# Patient Record
Sex: Female | Born: 1935 | Race: Black or African American | Hispanic: No
Health system: Southern US, Community
[De-identification: ages and names within clinical notes are randomized; demographics above are authoritative.]

## PROBLEM LIST (undated history)

## (undated) DIAGNOSIS — M4802 Spinal stenosis, cervical region: Secondary | ICD-10-CM

## (undated) DIAGNOSIS — I5022 Chronic systolic (congestive) heart failure: Secondary | ICD-10-CM

## (undated) DIAGNOSIS — F0390 Unspecified dementia without behavioral disturbance: Secondary | ICD-10-CM

## (undated) DIAGNOSIS — I340 Nonrheumatic mitral (valve) insufficiency: Secondary | ICD-10-CM

## (undated) DIAGNOSIS — Z950 Presence of cardiac pacemaker: Secondary | ICD-10-CM

## (undated) DIAGNOSIS — G3189 Other specified degenerative diseases of nervous system: Secondary | ICD-10-CM

## (undated) DIAGNOSIS — E119 Type 2 diabetes mellitus without complications: Secondary | ICD-10-CM

## (undated) DIAGNOSIS — M1990 Unspecified osteoarthritis, unspecified site: Secondary | ICD-10-CM

## (undated) DIAGNOSIS — N118 Other chronic tubulo-interstitial nephritis: Secondary | ICD-10-CM

## (undated) DIAGNOSIS — I110 Hypertensive heart disease with heart failure: Secondary | ICD-10-CM

## (undated) DIAGNOSIS — I739 Peripheral vascular disease, unspecified: Secondary | ICD-10-CM

## (undated) DIAGNOSIS — Z85038 Personal history of other malignant neoplasm of large intestine: Secondary | ICD-10-CM

## (undated) DIAGNOSIS — G9529 Other cord compression: Secondary | ICD-10-CM

## (undated) DIAGNOSIS — I480 Paroxysmal atrial fibrillation: Secondary | ICD-10-CM

## (undated) DIAGNOSIS — R740 Nonspecific elevation of levels of transaminase and lactic acid dehydrogenase [LDH]: Secondary | ICD-10-CM

## (undated) DIAGNOSIS — E785 Hyperlipidemia, unspecified: Secondary | ICD-10-CM

## (undated) DIAGNOSIS — I2510 Atherosclerotic heart disease of native coronary artery without angina pectoris: Secondary | ICD-10-CM

## (undated) DIAGNOSIS — I2721 Secondary pulmonary arterial hypertension: Secondary | ICD-10-CM

## (undated) DIAGNOSIS — F329 Major depressive disorder, single episode, unspecified: Secondary | ICD-10-CM

## (undated) DIAGNOSIS — M109 Gout, unspecified: Secondary | ICD-10-CM

## (undated) DIAGNOSIS — E538 Deficiency of other specified B group vitamins: Secondary | ICD-10-CM

## (undated) DIAGNOSIS — C801 Malignant (primary) neoplasm, unspecified: Secondary | ICD-10-CM

## (undated) DIAGNOSIS — Z8673 Personal history of transient ischemic attack (TIA), and cerebral infarction without residual deficits: Secondary | ICD-10-CM

## (undated) DIAGNOSIS — R918 Other nonspecific abnormal finding of lung field: Secondary | ICD-10-CM

## (undated) DIAGNOSIS — N189 Chronic kidney disease, unspecified: Secondary | ICD-10-CM

## (undated) DIAGNOSIS — R7401 Elevation of levels of liver transaminase levels: Secondary | ICD-10-CM

## (undated) DIAGNOSIS — Z794 Long term (current) use of insulin: Secondary | ICD-10-CM

## (undated) DIAGNOSIS — I071 Rheumatic tricuspid insufficiency: Secondary | ICD-10-CM

## (undated) DIAGNOSIS — F32A Depression, unspecified: Secondary | ICD-10-CM

## (undated) DIAGNOSIS — J189 Pneumonia, unspecified organism: Secondary | ICD-10-CM

## (undated) HISTORY — DX: Atherosclerotic heart disease of native coronary artery without angina pectoris: I25.10

## (undated) HISTORY — DX: Nonrheumatic mitral (valve) insufficiency: I34.0

## (undated) HISTORY — DX: Rheumatic tricuspid insufficiency: I07.1

## (undated) HISTORY — PX: BREAST LUMPECTOMY: SHX2

## (undated) HISTORY — DX: Pneumonia, unspecified organism: J18.9

## (undated) HISTORY — DX: Other cord compression: G95.29

## (undated) HISTORY — DX: Malignant (primary) neoplasm, unspecified: C80.1

## (undated) HISTORY — DX: Other specified degenerative diseases of nervous system: G31.89

## (undated) HISTORY — PX: CATARACT EXTRACTION: SUR2

## (undated) HISTORY — DX: Personal history of other malignant neoplasm of large intestine: Z85.038

## (undated) HISTORY — PX: LUMBAR SPINE SURGERY: SHX701

## (undated) HISTORY — DX: Secondary pulmonary arterial hypertension: I27.21

## (undated) HISTORY — DX: Gout, unspecified: M10.9

## (undated) HISTORY — DX: Chronic kidney disease, unspecified: N18.9

## (undated) HISTORY — DX: Hyperlipidemia, unspecified: E78.5

## (undated) HISTORY — DX: Spinal stenosis, cervical region: M48.02

## (undated) HISTORY — DX: Unspecified dementia without behavioral disturbance: F03.90

## (undated) HISTORY — DX: Peripheral vascular disease, unspecified: I73.9

## (undated) HISTORY — DX: Deficiency of other specified B group vitamins: E53.8

## (undated) HISTORY — DX: Type 2 diabetes mellitus without complications: E11.9

## (undated) HISTORY — DX: Presence of cardiac pacemaker: Z95.0

## (undated) HISTORY — DX: Personal history of transient ischemic attack (TIA), and cerebral infarction without residual deficits: Z86.73

## (undated) HISTORY — PX: OTHER SURGICAL HISTORY: SHX169

## (undated) HISTORY — DX: Nonspecific elevation of levels of transaminase and lactic acid dehydrogenase (ldh): R74.0

## (undated) HISTORY — DX: Other nonspecific abnormal finding of lung field: R91.8

## (undated) HISTORY — DX: Unspecified osteoarthritis, unspecified site: M19.90

## (undated) HISTORY — DX: Other chronic tubulo-interstitial nephritis: N11.8

## (undated) HISTORY — PX: EYE SURGERY: SHX253

## (undated) HISTORY — DX: Chronic systolic (congestive) heart failure: I50.22

## (undated) HISTORY — DX: Elevation of levels of liver transaminase levels: R74.01

## (undated) HISTORY — DX: Hypertensive heart disease with heart failure: I11.0

## (undated) HISTORY — PX: CARDIAC PACEMAKER PLACEMENT: SHX583

## (undated) HISTORY — DX: Long term (current) use of insulin: Z79.4

---

## 2015-02-19 ENCOUNTER — Other Ambulatory Visit (INDEPENDENT_AMBULATORY_CARE_PROVIDER_SITE_OTHER): Payer: Medicare Other

## 2015-02-19 ENCOUNTER — Ambulatory Visit (INDEPENDENT_AMBULATORY_CARE_PROVIDER_SITE_OTHER): Payer: Medicare Other | Admitting: Internal Medicine

## 2015-02-19 ENCOUNTER — Encounter: Payer: Self-pay | Admitting: Internal Medicine

## 2015-02-19 VITALS — BP 174/80 | HR 60 | Temp 98.3°F | Resp 16 | Ht <= 58 in | Wt 136.0 lb

## 2015-02-19 DIAGNOSIS — Z794 Long term (current) use of insulin: Secondary | ICD-10-CM

## 2015-02-19 DIAGNOSIS — R29898 Other symptoms and signs involving the musculoskeletal system: Secondary | ICD-10-CM

## 2015-02-19 DIAGNOSIS — R413 Other amnesia: Secondary | ICD-10-CM

## 2015-02-19 DIAGNOSIS — M109 Gout, unspecified: Secondary | ICD-10-CM

## 2015-02-19 DIAGNOSIS — N200 Calculus of kidney: Secondary | ICD-10-CM

## 2015-02-19 DIAGNOSIS — E1122 Type 2 diabetes mellitus with diabetic chronic kidney disease: Secondary | ICD-10-CM

## 2015-02-19 DIAGNOSIS — I10 Essential (primary) hypertension: Secondary | ICD-10-CM

## 2015-02-19 DIAGNOSIS — I739 Peripheral vascular disease, unspecified: Secondary | ICD-10-CM

## 2015-02-19 DIAGNOSIS — E785 Hyperlipidemia, unspecified: Secondary | ICD-10-CM | POA: Diagnosis not present

## 2015-02-19 DIAGNOSIS — N189 Chronic kidney disease, unspecified: Secondary | ICD-10-CM | POA: Diagnosis not present

## 2015-02-19 DIAGNOSIS — E118 Type 2 diabetes mellitus with unspecified complications: Secondary | ICD-10-CM

## 2015-02-19 DIAGNOSIS — Z950 Presence of cardiac pacemaker: Secondary | ICD-10-CM

## 2015-02-19 DIAGNOSIS — R011 Cardiac murmur, unspecified: Secondary | ICD-10-CM

## 2015-02-19 HISTORY — DX: Gout, unspecified: M10.9

## 2015-02-19 HISTORY — DX: Chronic kidney disease, unspecified: N18.9

## 2015-02-19 HISTORY — DX: Presence of cardiac pacemaker: Z95.0

## 2015-02-19 HISTORY — DX: Peripheral vascular disease, unspecified: I73.9

## 2015-02-19 LAB — CBC WITH DIFFERENTIAL/PLATELET
BASOS ABS: 0.1 10*3/uL (ref 0.0–0.1)
Basophils Relative: 0.7 % (ref 0.0–3.0)
EOS ABS: 0.3 10*3/uL (ref 0.0–0.7)
Eosinophils Relative: 3.2 % (ref 0.0–5.0)
HEMATOCRIT: 43.1 % (ref 36.0–46.0)
Hemoglobin: 14.2 g/dL (ref 12.0–15.0)
LYMPHS PCT: 30.7 % (ref 12.0–46.0)
Lymphs Abs: 2.4 10*3/uL (ref 0.7–4.0)
MCHC: 32.9 g/dL (ref 30.0–36.0)
MCV: 91.9 fl (ref 78.0–100.0)
Monocytes Absolute: 0.6 10*3/uL (ref 0.1–1.0)
Monocytes Relative: 8.2 % (ref 3.0–12.0)
NEUTROS ABS: 4.5 10*3/uL (ref 1.4–7.7)
NEUTROS PCT: 57.2 % (ref 43.0–77.0)
PLATELETS: 203 10*3/uL (ref 150.0–400.0)
RBC: 4.69 Mil/uL (ref 3.87–5.11)
RDW: 16.4 % — ABNORMAL HIGH (ref 11.5–15.5)
WBC: 7.9 10*3/uL (ref 4.0–10.5)

## 2015-02-19 LAB — COMPREHENSIVE METABOLIC PANEL
ALT: 22 U/L (ref 0–35)
AST: 26 U/L (ref 0–37)
Albumin: 3.6 g/dL (ref 3.5–5.2)
Alkaline Phosphatase: 111 U/L (ref 39–117)
BILIRUBIN TOTAL: 0.5 mg/dL (ref 0.2–1.2)
BUN: 19 mg/dL (ref 6–23)
CALCIUM: 9.7 mg/dL (ref 8.4–10.5)
CO2: 29 meq/L (ref 19–32)
CREATININE: 0.97 mg/dL (ref 0.40–1.20)
Chloride: 102 mEq/L (ref 96–112)
GFR: 70.76 mL/min (ref >60.00)
GLUCOSE: 200 mg/dL — AB (ref 70–99)
Potassium: 3.7 mEq/L (ref 3.5–5.1)
Sodium: 141 mEq/L (ref 135–145)
TOTAL PROTEIN: 7.9 g/dL (ref 6.0–8.3)

## 2015-02-19 LAB — PHOSPHORUS: Phosphorus: 2.7 mg/dL (ref 2.3–4.6)

## 2015-02-19 LAB — HEMOGLOBIN A1C: Hgb A1c MFr Bld: 7.9 % — ABNORMAL HIGH (ref 4.6–6.5)

## 2015-02-19 LAB — VITAMIN B12: Vitamin B-12: >1500 pg/mL — ABNORMAL HIGH (ref 211–911)

## 2015-02-19 LAB — TSH: TSH: 1.51 u[IU]/mL (ref 0.35–4.50)

## 2015-02-19 NOTE — Progress Notes (Signed)
Pre visit review using our clinic review tool, if applicable. No additional management support is needed unless otherwise documented below in the visit note. 

## 2015-02-19 NOTE — Patient Instructions (Signed)
  Test(s) ordered today. Your results will be released to Arthur (or called to you) after review, usually within 72hours after test completion. If any changes need to be made, you will be notified at that same time.  Monitor your blood pressure at home daily, goal is less than or around 130/80.  Medications reviewed and updated.  No changes recommended at this time.  A referral was ordered for urology and neurology.  Please schedule followup in 4 weeks, sooner if needed.

## 2015-02-19 NOTE — Progress Notes (Signed)
Subjective:    Patient ID: Allison Dunn, female    DOB: 03/11/1931, 81 y.o.   MRN: [id number]  HPI She is here to establish with a new pcp. She just moved down here to live with her sister - her sister brought her down here because of significant memory problems.   Memory loss: She has memory loss and does not remember most of November.  Her sister states she did have some slight memory difficulties at the end of summer when she saw her last-she will just repeat questions. She was confused one day in November and got lost driving someplace she knew how to get to.  This is when her family realized resisting an problem. She has not paid any of her bills since the beginning of October. She does not remember any of November and is unsure about parts of October.  We do not have any other medical history available except for her medications. She is a poor historian regarding current medical problems, surgeries, etc.  Diabetes: She is unsure how well her diabetes has been controlled in the past. Her sister is currently checking her sugar regularly and giving her insulin. Her sugars are in the low side she is not getting insulin. She is eating a healthy diet currently because her sister is preparing her food.  She is not currently exercising regularly. She is unsure when her last eye exam was.  Chronic kidney disease: She has chronic kidney disease, but is unsure how bad it is. She thinks she was following with a kidney specialist.  Pacemaker: She does have a pacemaker, but is unsure why. She is also taking amiodarone. She cannot tell me anything further regarding her history.  Peripheral vascular disease: Her sister knows that she did have a stent or surgery on her left leg arterial blockage. She is taking Plavix daily. Her left leg has been larger in her right leg.  Right arm weakness: She has a history of weakness in her right arm. She and her sister are not able to tolerate why she has the  weakness. She is unsure if she has had a prior stroke.  She has a kidney stone and just before she was moved down here she was supposed to have the stone removed by a urologist. She was advised that she needs to follow-up with the urologist to have this removed-they have a doctor and they were advised to follow-up with.  Medications and allergies reviewed with patient and updated.  Patient Active Problem List   Diagnosis Date Noted  . Hyperlipidemia 02/19/2015  . Gout 02/19/2015  . Kidney stone 02/19/2015  . PAD (peripheral artery disease) ([place]) 02/19/2015  . Right arm weakness 02/19/2015  . Cardiac pacemaker in situ 02/19/2015  . CKD (chronic kidney disease) 02/19/2015     Medication List         allopurinol 300 MG tablet  Commonly known as:  ZYLOPRIM  Take 300 mg by mouth daily.     amiodarone 200 MG tablet  Commonly known as:  PACERONE  Take 200 mg by mouth daily.     amLODipine-benazepril 5-10 MG capsule  Commonly known as:  LOTREL  Take 1 capsule by mouth daily.     aspirin 81 MG tablet  Take 81 mg by mouth daily.     atorvastatin 80 MG tablet  Commonly known as:  LIPITOR  Take 80 mg by mouth daily.     B-12 PO  Take 1,500 mcg by mouth  daily.     clopidogrel 75 MG tablet  Commonly known as:  PLAVIX  Take 75 mg by mouth daily.     HUMALOG 100 UNIT/ML injection  Generic drug:  insulin lispro  Inject into the skin 3 (three) times daily before meals. 6 units in the AM, 10 units before lunch, 10 units before bed     LEVEMIR 100 UNIT/ML injection  Generic drug:  insulin detemir  Inject 6 Units into the skin at bedtime.     phosphorus 155-852-130 MG tablet  Commonly known as:  K PHOS NEUTRAL  Take 250 mg by mouth 2 (two) times daily.     Vitamin D-3 1000 units Caps  Take 1,000 Units by mouth daily.     WOMENS MULTI VITAMIN & MINERAL PO  Take by mouth daily.         Past Medical History  Diagnosis Date  . Hypertension   . Hyperlipidemia   .  Diabetes mellitus without complication ([place])   . Cancer [facility])     breast    Past Surgical History  Procedure Laterality Date  . Kidney stone removal      Social History   Social History  . Marital Status: Widowed    Spouse Name: N/A  . Number of Children: N/A  . Years of Education: N/A   Social History Main Topics  . Smoking status: Never Smoker   . Smokeless tobacco: Never Used  . Alcohol Use: No  . Drug Use: No  . Sexual Activity: Not Asked   Other Topics Concern  . None   Social History Narrative  . None    Review of Systems  Constitutional: Negative for fever, chills and appetite change.  Respiratory: Positive for shortness of breath (with exertion). Negative for cough and wheezing.   Cardiovascular: Positive for palpitations (sometimes) and leg swelling (left leg swelling - stent in leg). Negative for chest pain.  Gastrointestinal: Negative for nausea and abdominal pain.  Neurological: Positive for weakness. Negative for dizziness, light-headedness, numbness and headaches.       Objective:   Filed Vitals:   02/19/15 1310  BP: 174/80  Pulse: 60  Temp: 98.3 F (36.8 C)  Resp: 16   Filed Weights   02/19/15 1310  Weight: 136 lb (61.689 kg)   Body mass index is 28.43 kg/(m^2).   Physical Exam  Constitutional: She is oriented to person, place, and time. She appears well-developed and well-nourished. No distress.  HENT:  Head: Normocephalic and atraumatic.  Right Ear: External ear normal.  Left Ear: External ear normal.  Mouth/Throat: Oropharynx is clear and moist.  Normal ear canals and TM  Eyes: Conjunctivae are normal.  Neck: Neck supple. No JVD present. No tracheal deviation present. No thyromegaly present.  Cardiovascular: Normal rate and regular rhythm.   Murmur (3/6 systolic murmur) heard. Pulmonary/Chest: Effort normal and breath sounds normal. No respiratory distress. She has no wheezes. She has no rales.  Abdominal: Soft. She exhibits no  distension. There is no tenderness.  Musculoskeletal: She exhibits edema (b/l L > R  2 + on L, 1 + on R).  Lymphadenopathy:    She has no cervical adenopathy.  Neurological: She is alert and oriented to person, place, and time. No cranial nerve deficit. She exhibits normal muscle tone.  Difficulty with short term recall, able to draw a clock, no obvious weakness on exam, uses cane to walk since back surgery  Skin: Skin is warm and dry. She is not  diaphoretic.  Psychiatric: She has a normal mood and affect. Her behavior is normal.       Assessment & Plan:   Much of her history is unknown. We have requested medical records from some of her doctors.  She will stay living with her sister and her sister will manage her medications and medical problems.  See problem list for assessment and plan  Follow-up in approximately one month, sooner if needed

## 2015-02-21 DIAGNOSIS — I10 Essential (primary) hypertension: Secondary | ICD-10-CM | POA: Insufficient documentation

## 2015-02-21 DIAGNOSIS — R011 Cardiac murmur, unspecified: Secondary | ICD-10-CM | POA: Insufficient documentation

## 2015-02-21 DIAGNOSIS — E119 Type 2 diabetes mellitus without complications: Secondary | ICD-10-CM | POA: Insufficient documentation

## 2015-02-21 NOTE — Assessment & Plan Note (Signed)
Her sister states that she did have a stent placed in her left leg Taking Plavix daily Will request medical records for further information

## 2015-02-21 NOTE — Assessment & Plan Note (Signed)
She was about to have this removed by a urologist before she moved to this area We will refer to the urologist that she is requested

## 2015-02-21 NOTE — Assessment & Plan Note (Signed)
Unknown reason for placement Have requested medical records Will refer to cardiology for routine monitoring

## 2015-02-21 NOTE — Assessment & Plan Note (Signed)
Continue allopurinol 300 mg daily

## 2015-02-21 NOTE — Assessment & Plan Note (Signed)
Check blood work today We'll refer to nephrology

## 2015-02-21 NOTE — Assessment & Plan Note (Signed)
Taking atorvastatin 80 mg daily History unknown, but likely has coronary artery disease She did eat today so we are unable to check her lipid panel-we'll check at her next visit Continue atorvastatin 80 mg daily

## 2015-02-21 NOTE — Assessment & Plan Note (Signed)
?   Cause of right arm weakness Weakness is not significant on exam Have requested medical records

## 2015-02-21 NOTE — Assessment & Plan Note (Signed)
Kidney disease likely related to diabetes Unsure of control in past, but sugars at home recently have been controlled Her sister is controlling her insulin I assume she is on insulin because of her kidney disease given such a low dose Check A1c Continue current insulins

## 2015-02-21 NOTE — Assessment & Plan Note (Signed)
Blood pressure is elevated here today, but she is slightly anxious Her sister will start monitoring it daily at home and let me know if it is elevated so we can adjust medication Continue current medication for now

## 2015-02-21 NOTE — Assessment & Plan Note (Signed)
Cardiac history unknown given the patient's memory loss Echocardiogram ordered Will refer to cardiology given that she is on amiodarone and has a pacemaker

## 2015-02-24 ENCOUNTER — Ambulatory Visit (INDEPENDENT_AMBULATORY_CARE_PROVIDER_SITE_OTHER): Payer: Medicare Other | Admitting: Neurology

## 2015-02-24 ENCOUNTER — Encounter: Payer: Self-pay | Admitting: Neurology

## 2015-02-24 VITALS — BP 140/70 | HR 60 | Resp 14 | Wt 137.0 lb

## 2015-02-24 DIAGNOSIS — E119 Type 2 diabetes mellitus without complications: Secondary | ICD-10-CM | POA: Insufficient documentation

## 2015-02-24 DIAGNOSIS — I10 Essential (primary) hypertension: Secondary | ICD-10-CM | POA: Diagnosis not present

## 2015-02-24 DIAGNOSIS — I110 Hypertensive heart disease with heart failure: Secondary | ICD-10-CM | POA: Insufficient documentation

## 2015-02-24 DIAGNOSIS — Z794 Long term (current) use of insulin: Secondary | ICD-10-CM

## 2015-02-24 DIAGNOSIS — E785 Hyperlipidemia, unspecified: Secondary | ICD-10-CM

## 2015-02-24 DIAGNOSIS — G3184 Mild cognitive impairment, so stated: Secondary | ICD-10-CM

## 2015-02-24 HISTORY — DX: Hypertensive heart disease with heart failure: I11.0

## 2015-02-24 HISTORY — DX: Type 2 diabetes mellitus without complications: Z79.4

## 2015-02-24 HISTORY — DX: Type 2 diabetes mellitus without complications: E11.9

## 2015-02-24 MED ORDER — DONEPEZIL HCL 5 MG PO TABS: ORAL_TABLET | ORAL | Status: DC

## 2015-02-24 NOTE — Patient Instructions (Signed)
1. Schedule Head CT without contrast 2. Start Aricept '5mg'$ : Take 1/2 tablet daily for 2 weeks, then increase to 1 tablet daily 3. Control of BP, cholesterol, as well as physical exercise and brain stimulation exercises  4. Follow-up in 37month

## 2015-02-24 NOTE — Progress Notes (Signed)
NEUROLOGY CONSULTATION NOTE  Nazanin Kinner MRN: [id number] DOB: 1931/06/25  Referring provider: Dr. Billey Gosling Primary care provider: Dr. Billey Gosling  Reason for consult:  Memory loss  Dear Dr Quay Burow:  Thank you for your kind referral of Allison Dunn for consultation of the above symptoms. Although her history is well known to you, please allow me to reiterate it for the purpose of our medical record. The patient was accompanied to the clinic by her sister who also provides collateral information. Records and images were personally reviewed where available.  HISTORY OF PRESENT ILLNESS: This is a pleasant 82 year old right-handed woman with a history of hypertension, hyperlipidemia, diabetes, pacemaker placement, presenting for evaluation of memory loss. She reports that there are things she can and cannot remember. She forgets conversations but states that she can recall names and dates. She had been living by herself in [place] until November 2016 when her sister called her Thanksgiving morning and reported that she was not too coherent, answering yes and no to questions. Later that day, her brother-in-law called her to say they were having problems. They brought her back to her apartment and found it to be a mess, she did not know anything, and apparently had not paid the rent or bills for 2 months. She reports that memory changes started in October and she does not recall much of November. Her sister states that she came to pick her up and brought her to live with her in [place] last month. Her sister states she did not seek medical attention at that time, but that they had called her doctor to straighten out her medications. Her sister is now in charge of bills and her medications. She stopped driving after November. Her sister feels she is doing better, she sometimes misplaces things, otherwise no significant concerns. No personality changes. Her sister asks about stress causing  the symptoms, her lease was ending in November and she was under stress packing up to move in with her sister.   She has some right shoulder pain and walks with a cane due to difficulty straightening out both legs (right>left). She has some numbness and tingling in both hands and feet. Otherwise she denies any headaches, dizziness, diplopia, dysarthria/dysphagia, neck/back pain, bowel/bladder dysfunction. No anosmia, tremors, or falls. There is no family history of dementia. She denies any history of head injuries, no alcohol use.   Laboratory Data: Lab Results  Component Value Date   WBC 7.9 02/19/2015   HGB 14.2 02/19/2015   HCT 43.1 02/19/2015   MCV 91.9 02/19/2015   PLT 203.0 02/19/2015     Chemistry      Component Value Date/Time   NA 141 02/19/2015 1428   K 3.7 02/19/2015 1428   CL 102 02/19/2015 1428   CO2 29 02/19/2015 1428   BUN 19 02/19/2015 1428   CREATININE 0.97 02/19/2015 1428      Component Value Date/Time   CALCIUM 9.7 02/19/2015 1428   ALKPHOS 111 02/19/2015 1428   AST 26 02/19/2015 1428   ALT 22 02/19/2015 1428   BILITOT 0.5 02/19/2015 1428     Lab Results  Component Value Date   TSH 1.51 02/19/2015   Lab Results  Component Value Date   VITAMINB12 >1500* 02/19/2015    PAST MEDICAL HISTORY: Past Medical History  Diagnosis Date  . Hypertension   . Hyperlipidemia   . Diabetes mellitus without complication ([place])   . Cancer [facility])     breast  PAST SURGICAL HISTORY: Past Surgical History  Procedure Laterality Date  . Kidney stone removal    . Lumbar spine surgery      MEDICATIONS: Current Outpatient Prescriptions on File Prior to Visit  Medication Sig Dispense Refill  . allopurinol (ZYLOPRIM) 300 MG tablet Take 300 mg by mouth daily.    Marland Kitchen amiodarone (PACERONE) 200 MG tablet Take 200 mg by mouth daily.    Marland Kitchen amLODipine-benazepril (LOTREL) 5-10 MG capsule Take 1 capsule by mouth daily.    Marland Kitchen aspirin 81 MG tablet Take 81 mg by mouth daily.    Marland Kitchen  atorvastatin (LIPITOR) 80 MG tablet Take 80 mg by mouth daily.    . Cholecalciferol (VITAMIN D-3) 1000 units CAPS Take 1,000 Units by mouth daily.    . clopidogrel (PLAVIX) 75 MG tablet Take 75 mg by mouth daily.    . insulin detemir (LEVEMIR) 100 UNIT/ML injection Inject 6 Units into the skin at bedtime.    . insulin lispro (HUMALOG) 100 UNIT/ML injection Inject into the skin 3 (three) times daily before meals. 6 units in the AM, 10 units before lunch, 10 units before bed    . Multiple Vitamins-Minerals (WOMENS MULTI VITAMIN & MINERAL PO) Take by mouth daily.    . phosphorus (K PHOS NEUTRAL) 155-852-130 MG tablet Take 250 mg by mouth 2 (two) times daily.    . Cyanocobalamin (B-12 PO) Take 1,500 mcg by mouth daily.     No current facility-administered medications on file prior to visit.    ALLERGIES: No Known Allergies  FAMILY HISTORY: Family History  Problem Relation Age of Onset  . Cancer Mother     pancreatic  . Diabetes Sister   . Hypertension Sister   . Hypertension Brother     SOCIAL HISTORY: Social History   Social History  . Marital Status: Widowed    Spouse Name: N/A  . Number of Children: N/A  . Years of Education: N/A   Occupational History  . Not on file.   Social History Main Topics  . Smoking status: Never Smoker   . Smokeless tobacco: Never Used  . Alcohol Use: No  . Drug Use: No  . Sexual Activity: Not on file   Other Topics Concern  . Not on file   Social History Narrative    REVIEW OF SYSTEMS: Constitutional: No fevers, chills, or sweats, no generalized fatigue, change in appetite Eyes: No visual changes, double vision, eye pain Ear, nose and throat: No hearing loss, ear pain, nasal congestion, sore throat Cardiovascular: No chest pain, palpitations Respiratory:  No shortness of breath at rest or with exertion, wheezes GastrointestinaI: No nausea, vomiting, diarrhea, abdominal pain, fecal incontinence Genitourinary:  No dysuria, urinary  retention or frequency Musculoskeletal:  No neck pain, back pain Integumentary: No rash, pruritus, skin lesions Neurological: as above Psychiatric: No depression, insomnia, anxiety Endocrine: No palpitations, fatigue, diaphoresis, mood swings, change in appetite, change in weight, increased thirst Hematologic/Lymphatic:  No anemia, purpura, petechiae. Allergic/Immunologic: no itchy/runny eyes, nasal congestion, recent allergic reactions, rashes  PHYSICAL EXAM: Filed Vitals:   02/24/15 1035  BP: 140/70  Pulse: 60  Resp: 14   General: No acute distress Head:  Normocephalic/atraumatic Eyes: Fundoscopic exam shows bilateral sharp discs, no vessel changes, exudates, or hemorrhages Neck: supple, no paraspinal tenderness, full range of motion Back: No paraspinal tenderness Heart: regular rate and rhythm Lungs: Clear to auscultation bilaterally. Vascular: No carotid bruits. Skin/Extremities: No rash, no edema Neurological Exam: Mental status: alert and oriented to  person, place, and time, no dysarthria or aphasia, Fund of knowledge is appropriate.  Remote memory intact.  Attention and concentration are normal.    Able to name objects and repeat phrases. CDT 5/5. MMSE - Mini Mental State Exam 02/24/2015  Orientation to time 4  Orientation to Place 4  Registration 3  Attention/ Calculation 4  Recall 0  Language- name 2 objects 2  Language- repeat 1  Language- follow 3 step command 3  Language- read & follow direction 1  Write a sentence 1  Copy design 1  Total score 24   Cranial nerves: CN I: not tested CN II: pupils equal, round and reactive to light, visual fields intact, fundi unremarkable. CN III, IV, VI:  full range of motion, no nystagmus, no ptosis CN V: facial sensation intact CN VII: upper and lower face symmetric CN VIII: hearing intact to finger rub CN IX, X: gag intact, uvula midline CN XI: sternocleidomastoid and trapezius muscles intact CN XII: tongue midline Bulk  & Tone: normal, no fasciculations. Motor: 5/5 throughout with no pronator drift but some difficulty raising right arm at shoulder due to pain. Sensation: intact to light touch, cold, pin, vibration and joint position sense.  No extinction to double simultaneous stimulation.  Romberg test negative Deep Tendon Reflexes: +1throughout, no ankle clonus Plantar responses: downgoing bilaterally Cerebellar: no incoordination on finger to nose testing Gait: slow and cautious favoring right leg ambulating with cane, no ataxia, unable to tandem walk Tremor: none  IMPRESSION: This is a pleasant 82 year old right-handed woman with vascular risk factors including hypertension, hyperlipidemia, diabetes, s/p pacemaker, presenting for memory loss. She had significant confusion in the months of October/November 2016 and has since moved in with her sister. Her sister feels she is doing better. MMSE today 24/30, suggestive of mild cognitive impairment. The episode of confusion is of unclear etiology, they did not seek medical attention at that time, it may have been metabolic, as she appears to have improved, however did not have any head imaging at that time, head CT without contrast will be ordered to assess for underlying structural abnormality. We discussed that she may benefit from starting cholinesterase inhibitors such as Aricept, she will start low dose '5mg'$  1/2 tablet daily for 2 weeks, then increase to 1 tablet daily. We discussed the importance of control of vascular risk factors, as well as physical exercise and brain stimulation exercises for brain health. She does not drive. She will follow-up in 8 months and knows to call for any problems.   Thank you for allowing me to participate in the care of this patient. Please do not hesitate to call for any questions or concerns.   Ellouise Newer, M.D.  CC: Dr. Quay Burow

## 2015-03-01 ENCOUNTER — Other Ambulatory Visit: Payer: Medicare Other

## 2015-03-04 ENCOUNTER — Ambulatory Visit
Admission: RE | Admit: 2015-03-04 | Discharge: 2015-03-04 | Disposition: A | Discharge status: Home / Self Care | Payer: Medicare Other | Source: Ambulatory Visit | Attending: Neurology | Admitting: Neurology

## 2015-03-08 ENCOUNTER — Other Ambulatory Visit (HOSPITAL_COMMUNITY): Payer: Self-pay | Admitting: Urology

## 2015-03-08 DIAGNOSIS — N200 Calculus of kidney: Secondary | ICD-10-CM

## 2015-03-09 ENCOUNTER — Ambulatory Visit (HOSPITAL_BASED_OUTPATIENT_CLINIC_OR_DEPARTMENT_OTHER): Payer: Medicare Other

## 2015-03-09 ENCOUNTER — Telehealth: Payer: Self-pay | Admitting: Family Medicine

## 2015-03-09 ENCOUNTER — Other Ambulatory Visit: Payer: Self-pay

## 2015-03-09 DIAGNOSIS — K8020 Calculus of gallbladder without cholecystitis without obstruction: Secondary | ICD-10-CM | POA: Diagnosis not present

## 2015-03-09 DIAGNOSIS — R011 Cardiac murmur, unspecified: Secondary | ICD-10-CM | POA: Diagnosis present

## 2015-03-09 DIAGNOSIS — E785 Hyperlipidemia, unspecified: Secondary | ICD-10-CM | POA: Diagnosis not present

## 2015-03-09 DIAGNOSIS — I700 Atherosclerosis of aorta: Secondary | ICD-10-CM | POA: Diagnosis not present

## 2015-03-09 DIAGNOSIS — K5641 Fecal impaction: Secondary | ICD-10-CM | POA: Diagnosis not present

## 2015-03-09 DIAGNOSIS — Z853 Personal history of malignant neoplasm of breast: Secondary | ICD-10-CM | POA: Diagnosis not present

## 2015-03-09 DIAGNOSIS — N289 Disorder of kidney and ureter, unspecified: Secondary | ICD-10-CM | POA: Diagnosis not present

## 2015-03-09 DIAGNOSIS — M4856XA Collapsed vertebra, not elsewhere classified, lumbar region, initial encounter for fracture: Secondary | ICD-10-CM | POA: Diagnosis not present

## 2015-03-09 DIAGNOSIS — N200 Calculus of kidney: Secondary | ICD-10-CM | POA: Diagnosis not present

## 2015-03-09 DIAGNOSIS — R413 Other amnesia: Secondary | ICD-10-CM | POA: Diagnosis not present

## 2015-03-09 DIAGNOSIS — N1330 Unspecified hydronephrosis: Secondary | ICD-10-CM | POA: Diagnosis not present

## 2015-03-09 NOTE — Telephone Encounter (Signed)
Lmovm to rtn my call. 

## 2015-03-09 NOTE — Telephone Encounter (Signed)
Returned your call.

## 2015-03-09 NOTE — Telephone Encounter (Signed)
I spoke with the patients sister & caregiver/Allison Dunn and notified her of results. She will let the patient know.

## 2015-03-09 NOTE — Telephone Encounter (Signed)
-----   Message from Cameron Sprang, MD sent at 03/09/2015  9:45 AM EST ----- Pls let her know I reviewed head CT and it is unremarkable, no evidence of tumor, stroke, or bleed. Thanks

## 2015-03-10 ENCOUNTER — Ambulatory Visit (HOSPITAL_COMMUNITY)
Admission: RE | Admit: 2015-03-10 | Discharge: 2015-03-10 | Disposition: A | Discharge status: Home / Self Care | Payer: Medicare Other | Source: Ambulatory Visit | Attending: Urology | Admitting: Urology

## 2015-03-10 DIAGNOSIS — R413 Other amnesia: Secondary | ICD-10-CM | POA: Insufficient documentation

## 2015-03-10 DIAGNOSIS — N200 Calculus of kidney: Secondary | ICD-10-CM | POA: Insufficient documentation

## 2015-03-10 DIAGNOSIS — Z853 Personal history of malignant neoplasm of breast: Secondary | ICD-10-CM | POA: Insufficient documentation

## 2015-03-10 DIAGNOSIS — R011 Cardiac murmur, unspecified: Secondary | ICD-10-CM | POA: Insufficient documentation

## 2015-03-10 DIAGNOSIS — I700 Atherosclerosis of aorta: Secondary | ICD-10-CM | POA: Insufficient documentation

## 2015-03-10 DIAGNOSIS — M4856XA Collapsed vertebra, not elsewhere classified, lumbar region, initial encounter for fracture: Secondary | ICD-10-CM | POA: Insufficient documentation

## 2015-03-10 DIAGNOSIS — K8020 Calculus of gallbladder without cholecystitis without obstruction: Secondary | ICD-10-CM | POA: Insufficient documentation

## 2015-03-10 DIAGNOSIS — E785 Hyperlipidemia, unspecified: Secondary | ICD-10-CM | POA: Insufficient documentation

## 2015-03-10 DIAGNOSIS — K5641 Fecal impaction: Secondary | ICD-10-CM | POA: Insufficient documentation

## 2015-03-10 DIAGNOSIS — N1330 Unspecified hydronephrosis: Secondary | ICD-10-CM | POA: Insufficient documentation

## 2015-03-10 DIAGNOSIS — N289 Disorder of kidney and ureter, unspecified: Secondary | ICD-10-CM | POA: Insufficient documentation

## 2015-03-11 ENCOUNTER — Encounter: Payer: Self-pay | Admitting: Internal Medicine

## 2015-03-11 DIAGNOSIS — I071 Rheumatic tricuspid insufficiency: Secondary | ICD-10-CM

## 2015-03-11 DIAGNOSIS — I340 Nonrheumatic mitral (valve) insufficiency: Secondary | ICD-10-CM

## 2015-03-11 DIAGNOSIS — I2721 Secondary pulmonary arterial hypertension: Secondary | ICD-10-CM | POA: Insufficient documentation

## 2015-03-11 DIAGNOSIS — I420 Dilated cardiomyopathy: Secondary | ICD-10-CM | POA: Insufficient documentation

## 2015-03-11 HISTORY — DX: Rheumatic tricuspid insufficiency: I07.1

## 2015-03-11 HISTORY — DX: Nonrheumatic mitral (valve) insufficiency: I34.0

## 2015-03-11 HISTORY — DX: Secondary pulmonary arterial hypertension: I27.21

## 2015-03-12 ENCOUNTER — Encounter: Payer: Self-pay | Admitting: Emergency Medicine

## 2015-03-16 ENCOUNTER — Telehealth: Payer: Self-pay | Admitting: Internal Medicine

## 2015-03-16 NOTE — Telephone Encounter (Signed)
Rec'd from Gentry Roch MD forward 69 pages to Dr. Quay Burow

## 2015-03-17 NOTE — Progress Notes (Signed)
No Show

## 2015-03-23 ENCOUNTER — Encounter: Payer: Medicare Other | Admitting: Cardiovascular Disease

## 2015-03-23 ENCOUNTER — Encounter: Payer: Self-pay | Admitting: Internal Medicine

## 2015-03-23 ENCOUNTER — Ambulatory Visit (INDEPENDENT_AMBULATORY_CARE_PROVIDER_SITE_OTHER): Payer: Medicare Other | Admitting: Internal Medicine

## 2015-03-23 VITALS — BP 144/66 | HR 60 | Ht 60.0 in | Wt 139.6 lb

## 2015-03-23 VITALS — BP 154/64 | HR 58 | Temp 97.6°F | Resp 16 | Wt 132.0 lb

## 2015-03-23 DIAGNOSIS — I6320 Cerebral infarction due to unspecified occlusion or stenosis of unspecified precerebral arteries: Secondary | ICD-10-CM

## 2015-03-23 DIAGNOSIS — I10 Essential (primary) hypertension: Secondary | ICD-10-CM | POA: Diagnosis not present

## 2015-03-23 DIAGNOSIS — Z794 Long term (current) use of insulin: Secondary | ICD-10-CM

## 2015-03-23 DIAGNOSIS — E119 Type 2 diabetes mellitus without complications: Secondary | ICD-10-CM

## 2015-03-23 DIAGNOSIS — Z950 Presence of cardiac pacemaker: Secondary | ICD-10-CM

## 2015-03-23 DIAGNOSIS — I6330 Cerebral infarction due to thrombosis of unspecified cerebral artery: Secondary | ICD-10-CM | POA: Insufficient documentation

## 2015-03-23 DIAGNOSIS — Z8673 Personal history of transient ischemic attack (TIA), and cerebral infarction without residual deficits: Secondary | ICD-10-CM

## 2015-03-23 HISTORY — DX: Personal history of transient ischemic attack (TIA), and cerebral infarction without residual deficits: Z86.73

## 2015-03-23 LAB — CUP PACEART INCLINIC DEVICE CHECK
Battery Voltage: 3.02 V
Brady Statistic AP VP Percent: 1.48 %
Brady Statistic AS VP Percent: 1.31 %
Brady Statistic RA Percent Paced: 65.54 %
Implantable Lead Implant Date: 20160405
Implantable Lead Location: 753860
Implantable Lead Model: 5076
Implantable Lead Model: 5076
Lead Channel Impedance Value: 304 Ohm
Lead Channel Impedance Value: 380 Ohm
Lead Channel Impedance Value: 494 Ohm
Lead Channel Pacing Threshold Amplitude: 1 V
MDC IDC LEAD IMPLANT DT: 20160405
MDC IDC LEAD LOCATION: 753859
MDC IDC MSMT BATTERY REMAINING LONGEVITY: 112 mo
MDC IDC MSMT LEADCHNL RA IMPEDANCE VALUE: 456 Ohm
MDC IDC MSMT LEADCHNL RA PACING THRESHOLD PULSEWIDTH: 0.4 ms
MDC IDC MSMT LEADCHNL RV PACING THRESHOLD AMPLITUDE: 1 V
MDC IDC MSMT LEADCHNL RV PACING THRESHOLD PULSEWIDTH: 0.4 ms
MDC IDC MSMT LEADCHNL RV SENSING INTR AMPL: 5.875 mV
MDC IDC SESS DTM: 20170131114337
MDC IDC SET LEADCHNL RA PACING AMPLITUDE: 2 V
MDC IDC SET LEADCHNL RV PACING AMPLITUDE: 2 V
MDC IDC SET LEADCHNL RV PACING PULSEWIDTH: 0.4 ms
MDC IDC SET LEADCHNL RV SENSING SENSITIVITY: 2.8 mV
MDC IDC STAT BRADY AP VS PERCENT: 64.06 %
MDC IDC STAT BRADY AS VS PERCENT: 33.15 %
MDC IDC STAT BRADY RV PERCENT PACED: 2.79 %

## 2015-03-23 MED ORDER — APIXABAN 5 MG PO TABS: 5.0000 mg | ORAL_TABLET | Freq: Two times a day (BID) | ORAL | Status: DC

## 2015-03-23 MED ORDER — AMLODIPINE BESYLATE 5 MG PO TABS: 5.0000 mg | ORAL_TABLET | Freq: Every day | ORAL | Status: DC

## 2015-03-23 NOTE — Progress Notes (Signed)
Subjective:    Patient ID: Allison Dunn, female    DOB: 02-24-1931, 82 y.o.   MRN: [id number]  HPI She is here for follow up. She is here with her sister.  Afib, htn, PPM (sinus node dysfunction): She saw cardiology today and the aspirin was discontinued and she was placed on eliquis.  There is no other medication changes. Her blood pressure was a little bit elevated at that visit and also here today. Her sister has not checked her blood pressure consistently and she is unsure if it is elevated at home.  She does have some shortness of breath with exertion) times. She occasionally will feel palpitations.   Loss of peripheral vision on right side:  Her sister noticed it in the past week.  Her hand writing is worse in the past week.  Her right side is weaker recently.  Her gait is getting worse and slower.    Memory issues:  She feels her memory has gotten worse.  She has dificulty with recall and saying the correct name.    Diabetes: Her sister is giving her the insulin and feels her sugars are well controlled at home. She is compliant with a diabetic diet. Her last A1c was slightly elevated, but reflected a period of time when she was not taking her medication.   Medications and allergies reviewed with patient and updated if appropriate.  Patient Active Problem List   Diagnosis Date Noted  . Moderate tricuspid regurgitation 03/11/2015  . Cardiomyopathy ([place]), EF 40-45%  02/2015 03/11/2015  . Mild mitral regurgitation 03/11/2015  . Pulmonary arterial hypertension ([place]), mild 03/11/2015  . Mild cognitive impairment 02/24/2015  . Type 2 diabetes mellitus without complication, with long-term current use of insulin ([place]) 02/24/2015  . Essential hypertension 02/24/2015  . Diabetes ([place]) 02/21/2015  . Essential hypertension, benign 02/21/2015  . Hyperlipidemia 02/19/2015  . Gout 02/19/2015  . Kidney stone 02/19/2015  . PAD (peripheral artery disease) ([place]) 02/19/2015  . Right arm  weakness 02/19/2015  . Cardiac pacemaker in situ 02/19/2015  . CKD (chronic kidney disease) 02/19/2015    Current Outpatient Prescriptions on File Prior to Visit  Medication Sig Dispense Refill  . allopurinol (ZYLOPRIM) 300 MG tablet Take 300 mg by mouth daily.    Marland Kitchen amiodarone (PACERONE) 200 MG tablet Take 200 mg by mouth daily.    Marland Kitchen amLODipine-benazepril (LOTREL) 5-10 MG capsule Take 1 capsule by mouth daily.    Marland Kitchen atorvastatin (LIPITOR) 80 MG tablet Take 80 mg by mouth daily.    . Cholecalciferol (VITAMIN D-3) 1000 units CAPS Take 1,000 Units by mouth daily.    . Cyanocobalamin (B-12 PO) Take 1,500 mcg by mouth daily.    Marland Kitchen donepezil (ARICEPT) 5 MG tablet Take 1/2 tablet daily for 2 weeks, then increase to 1 tablet daily 30 tablet 11  . insulin detemir (LEVEMIR) 100 UNIT/ML injection Inject 6 Units into the skin at bedtime.    . insulin lispro (HUMALOG) 100 UNIT/ML injection Inject into the skin 3 (three) times daily before meals. 6 units in the AM, 10 units before lunch, 10 units before bed    . Multiple Vitamins-Minerals (WOMENS MULTI VITAMIN & MINERAL PO) Take by mouth daily.    . phosphorus (K PHOS NEUTRAL) 155-852-130 MG tablet Take 250 mg by mouth 2 (two) times daily.     No current facility-administered medications on file prior to visit.    Past Medical History  Diagnosis Date  . Hypertension   .  Hyperlipidemia   . Diabetes mellitus without complication ([place])   . Cancer [facility])     breast    Past Surgical History  Procedure Laterality Date  . Kidney stone removal    . Lumbar spine surgery      Social History   Social History  . Marital Status: Widowed    Spouse Name: N/A  . Number of Children: N/A  . Years of Education: N/A   Social History Main Topics  . Smoking status: Never Smoker   . Smokeless tobacco: Never Used  . Alcohol Use: No  . Drug Use: No  . Sexual Activity: Not Asked   Other Topics Concern  . None   Social History Narrative    Family  History  Problem Relation Age of Onset  . Cancer Mother     pancreatic  . Diabetes Sister   . Hypertension Sister   . Hypertension Brother     Review of Systems  Constitutional: Positive for appetite change (decreased). Negative for fever.  HENT: Positive for rhinorrhea.   Eyes: Positive for visual disturbance.  Respiratory: Positive for shortness of breath (with exertion). Negative for cough and wheezing.   Cardiovascular: Positive for palpitations. Negative for chest pain.  Gastrointestinal: Negative for abdominal pain.  Neurological: Positive for dizziness (intermittent) and weakness. Negative for numbness and headaches.       Objective:   Filed Vitals:   03/23/15 1440  BP: 154/64  Pulse: 58  Temp: 97.6 F (36.4 C)  Resp: 16   Filed Weights   03/23/15 1440  Weight: 132 lb (59.875 kg)   Body mass index is 25.78 kg/(m^2).   Physical Exam Constitutional: Appears well-developed and well-nourished. No distress.  Neck: Neck supple. No tracheal deviation present. No thyromegaly present.  No carotid bruit. No cervical adenopathy.   Cardiovascular: Normal rate, regular rhythm and normal heart sounds.   No murmur heard.  No edema Pulmonary/Chest: Effort normal and breath sounds normal. No respiratory distress. No wheezes.  Neuro:  Loss of vision right peripheral. Slight weakness right hand and right leg compared to left     Assessment & Plan:   See Problem List for Assessment and Plan of chronic medical problems.  Follow-up in 3 months, sooner if needed

## 2015-03-23 NOTE — Assessment & Plan Note (Signed)
Elevated here today Continue amlodipine-benazepril 5-10 mg daily Amlodipine 5 mg daily Her sister will try to monitor her blood pressure home Will adjust medication as needed

## 2015-03-23 NOTE — Patient Instructions (Addendum)
   Medications reviewed and updated.  Changes include adding amlodipine to your regimen to help lower your blood pressure.   Your prescription(s) have been submitted to your pharmacy. Please take as directed and contact our office if you believe you are having problem(s) with the medication(s).  A referral was ordered for home PT, OT  Please schedule followup in 26month

## 2015-03-23 NOTE — Progress Notes (Signed)
HPI Mrs. Allison Dunn returns today for followup. She is a elderly woman with HTN and symptomatic bradycardia, s/p PPM insertion. She has not had syncope. She has had some memory problems and vision changes. No chest pain or sob or edema. No Known Allergies   Current Outpatient Prescriptions  Medication Sig Dispense Refill  . allopurinol (ZYLOPRIM) 300 MG tablet Take 300 mg by mouth daily.    Marland Kitchen amiodarone (PACERONE) 200 MG tablet Take 200 mg by mouth daily.    Marland Kitchen amLODipine-benazepril (LOTREL) 5-10 MG capsule Take 1 capsule by mouth daily.    Marland Kitchen aspirin 81 MG tablet Take 81 mg by mouth daily.    Marland Kitchen atorvastatin (LIPITOR) 80 MG tablet Take 80 mg by mouth daily.    . Cholecalciferol (VITAMIN D-3) 1000 units CAPS Take 1,000 Units by mouth daily.    . clopidogrel (PLAVIX) 75 MG tablet Take 75 mg by mouth daily.    . Cyanocobalamin (B-12 PO) Take 1,500 mcg by mouth daily.    Marland Kitchen donepezil (ARICEPT) 5 MG tablet Take 1/2 tablet daily for 2 weeks, then increase to 1 tablet daily 30 tablet 11  . insulin detemir (LEVEMIR) 100 UNIT/ML injection Inject 6 Units into the skin at bedtime.    . insulin lispro (HUMALOG) 100 UNIT/ML injection Inject into the skin 3 (three) times daily before meals. 6 units in the AM, 10 units before lunch, 10 units before bed    . Multiple Vitamins-Minerals (WOMENS MULTI VITAMIN & MINERAL PO) Take by mouth daily.    . phosphorus (K PHOS NEUTRAL) 155-852-130 MG tablet Take 250 mg by mouth 2 (two) times daily.    Marland Kitchen sulfamethoxazole-trimethoprim (BACTRIM DS,SEPTRA DS) 800-160 MG tablet Take 1 tablet by mouth daily.  0   No current facility-administered medications for this visit.     Past Medical History  Diagnosis Date  . Hypertension   . Hyperlipidemia   . Diabetes mellitus without complication ([place])   . Cancer ([place])     breast    ROS:   All systems reviewed and negative except as noted in the HPI.   Past Surgical History  Procedure Laterality Date  . Kidney  stone removal    . Lumbar spine surgery       Family History  Problem Relation Age of Onset  . Cancer Mother     pancreatic  . Diabetes Sister   . Hypertension Sister   . Hypertension Brother      Social History   Social History  . Marital Status: Widowed    Spouse Name: N/A  . Number of Children: N/A  . Years of Education: N/A   Occupational History  . Not on file.   Social History Main Topics  . Smoking status: Never Smoker   . Smokeless tobacco: Never Used  . Alcohol Use: No  . Drug Use: No  . Sexual Activity: Not on file   Other Topics Concern  . Not on file   Social History Narrative     BP 144/66 mmHg  Pulse 60  Ht 5' (1.524 m)  Wt 139 lb 9.6 oz (63.322 kg)  BMI 27.26 kg/m2  Physical Exam:  Well appearing 82 yo man, NAD HEENT: Unremarkable Neck:  6 cm JVD, no thyromegally Lymphatics:  No adenopathy Back:  No CVA tenderness Lungs:  Clear with no wheezes HEART:  Regular rate rhythm, no murmurs, no rubs, no clicks Abd:  soft, positive bowel sounds, no organomegally, no rebound, no  guarding Ext:  2 plus pulses, no edema, no cyanosis, no clubbing Skin:  No rashes no nodules Neuro:  CN II through XII intact, motor grossly intact  EKG - nsr with atrial pacing  DEVICE  Normal device function.  See PaceArt for details.   A/P 1. Sinus node dysfunction - she is s/p PPM insertion and now is asymptomatic. 2. S/p PPM - her medtronic device is working normally. Will recheck in several months.  3. Atrial fib - she clearly has atrial fib on her PPM interogation. Will start eliquis 5 mg twice daily. 4. HTN - Her blood pressure is slightly elevated. Will follow.

## 2015-03-23 NOTE — Assessment & Plan Note (Addendum)
A1c slightly elevated at her last visit, but this included a time when she is not taking medication Sugar is here for a release so we will continue Recheck A1c in 3 months Advised her sister to call with any questions or concerns

## 2015-03-23 NOTE — Assessment & Plan Note (Signed)
#  1 and 2 weeks ago she developed right-sided weakness and loss of right peripheral vision-most likely stroke She was changed to Eliquis earlier today by cardiology She is on atorvastatin 80 mg daily Blood pressure slightly elevated here today. We will increase her medication-an additional 5 mg of amlodipine CT of the head earlier this month and showed no acute changes so we will hold off on most likely her atrial fibrillation was the cause of the stroke Will discuss with neurology to see if they feel imaging is necessary

## 2015-03-23 NOTE — Patient Instructions (Addendum)
Medication Instructions:  Your physician has recommended you make the following change in your medication: 1) Start Eliquis '5mg'$  twice daily 2) Stop Plavix 3) Stop Aspirin   Labwork: None ordered   Testing/Procedures: None ordered     Follow-Up: Your physician wants you to follow-up in: 12 months with Dr Knox Saliva will receive a reminder letter in the mail two months in advance. If you don't receive a letter, please call our office to schedule the follow-up appointment.  Remote monitoring is used to monitor your Pacemaker  from home. This monitoring reduces the number of office visits required to check your device to one time per year. It allows [place] to keep an eye on the functioning of your device to ensure it is working properly. You are scheduled for a device check from home on 06/22/15. You may send your transmission at any time that day. If you have a wireless device, the transmission will be sent automatically. After your physician reviews your transmission, you will receive a postcard with your next transmission date.     Any Other Special Instructions Will Be Listed Below (If Applicable).     If you need a refill on your cardiac medications before your next appointment, please call your pharmacy.

## 2015-03-23 NOTE — Progress Notes (Signed)
Pre visit review using our clinic review tool, if applicable. No additional management support is needed unless otherwise documented below in the visit note. 

## 2015-03-24 ENCOUNTER — Telehealth: Payer: Self-pay | Admitting: Internal Medicine

## 2015-03-24 DIAGNOSIS — H53451 Other localized visual field defect, right eye: Secondary | ICD-10-CM

## 2015-03-24 DIAGNOSIS — R531 Weakness: Secondary | ICD-10-CM

## 2015-03-24 NOTE — Telephone Encounter (Signed)
Yes it is needed due to her stroke symptoms since that ct scan

## 2015-03-24 NOTE — Telephone Encounter (Signed)
Please call pt's sister - let her know I did communicate with neuro and she did recommend a ct of her head.  She likely did have a stroke and although the ct may not change treatment it will confirm it - ct scan ordered.

## 2015-03-24 NOTE — Telephone Encounter (Signed)
Spoke with pt's sister. She would like to verify that another CT is needed after the one that was done on 03/04/15. Please advise.

## 2015-03-25 NOTE — Telephone Encounter (Signed)
Spoke with pt to inform.  

## 2015-03-27 ENCOUNTER — Encounter: Payer: Self-pay | Admitting: Internal Medicine

## 2015-03-27 DIAGNOSIS — M1990 Unspecified osteoarthritis, unspecified site: Secondary | ICD-10-CM

## 2015-03-27 HISTORY — DX: Unspecified osteoarthritis, unspecified site: M19.90

## 2015-04-07 ENCOUNTER — Telehealth: Payer: Self-pay | Admitting: Internal Medicine

## 2015-04-07 NOTE — Telephone Encounter (Signed)
Received medical records from Dr. Dub Mikes. Sent to Dr. Quay Burow. 04/07/15/ss

## 2015-04-14 ENCOUNTER — Ambulatory Visit (INDEPENDENT_AMBULATORY_CARE_PROVIDER_SITE_OTHER)
Admission: RE | Admit: 2015-04-14 | Discharge: 2015-04-14 | Disposition: A | Discharge status: Home / Self Care | Payer: Medicare Other | Source: Ambulatory Visit | Attending: Internal Medicine | Admitting: Internal Medicine

## 2015-04-14 DIAGNOSIS — R531 Weakness: Secondary | ICD-10-CM

## 2015-04-14 DIAGNOSIS — H53451 Other localized visual field defect, right eye: Secondary | ICD-10-CM | POA: Diagnosis not present

## 2015-04-14 DIAGNOSIS — M6289 Other specified disorders of muscle: Secondary | ICD-10-CM

## 2015-04-22 ENCOUNTER — Emergency Department (HOSPITAL_COMMUNITY): Payer: Medicare Other

## 2015-04-22 ENCOUNTER — Inpatient Hospital Stay (HOSPITAL_COMMUNITY): Payer: Medicare Other

## 2015-04-22 ENCOUNTER — Telehealth: Payer: Self-pay | Admitting: Internal Medicine

## 2015-04-22 ENCOUNTER — Inpatient Hospital Stay (HOSPITAL_COMMUNITY)
Admission: EM | Admit: 2015-04-22 | Discharge: 2015-04-26 | DRG: 871 | Disposition: A | Discharge status: Skilled Nursing Facility | Payer: Medicare Other | Attending: Internal Medicine | Admitting: Internal Medicine

## 2015-04-22 ENCOUNTER — Encounter (HOSPITAL_COMMUNITY): Payer: Self-pay | Admitting: Emergency Medicine

## 2015-04-22 DIAGNOSIS — N390 Urinary tract infection, site not specified: Secondary | ICD-10-CM

## 2015-04-22 DIAGNOSIS — Z833 Family history of diabetes mellitus: Secondary | ICD-10-CM | POA: Diagnosis not present

## 2015-04-22 DIAGNOSIS — J189 Pneumonia, unspecified organism: Secondary | ICD-10-CM | POA: Diagnosis present

## 2015-04-22 DIAGNOSIS — A419 Sepsis, unspecified organism: Secondary | ICD-10-CM | POA: Diagnosis not present

## 2015-04-22 DIAGNOSIS — M6281 Muscle weakness (generalized): Secondary | ICD-10-CM | POA: Diagnosis not present

## 2015-04-22 DIAGNOSIS — G3184 Mild cognitive impairment, so stated: Secondary | ICD-10-CM

## 2015-04-22 DIAGNOSIS — I2721 Secondary pulmonary arterial hypertension: Secondary | ICD-10-CM

## 2015-04-22 DIAGNOSIS — A414 Sepsis due to anaerobes: Secondary | ICD-10-CM

## 2015-04-22 DIAGNOSIS — F0390 Unspecified dementia without behavioral disturbance: Secondary | ICD-10-CM | POA: Diagnosis present

## 2015-04-22 DIAGNOSIS — I480 Paroxysmal atrial fibrillation: Secondary | ICD-10-CM | POA: Diagnosis present

## 2015-04-22 DIAGNOSIS — R410 Disorientation, unspecified: Secondary | ICD-10-CM

## 2015-04-22 DIAGNOSIS — I110 Hypertensive heart disease with heart failure: Secondary | ICD-10-CM | POA: Diagnosis present

## 2015-04-22 DIAGNOSIS — Z8249 Family history of ischemic heart disease and other diseases of the circulatory system: Secondary | ICD-10-CM

## 2015-04-22 DIAGNOSIS — R740 Nonspecific elevation of levels of transaminase and lactic acid dehydrogenase [LDH]: Secondary | ICD-10-CM | POA: Diagnosis present

## 2015-04-22 DIAGNOSIS — I739 Peripheral vascular disease, unspecified: Secondary | ICD-10-CM

## 2015-04-22 DIAGNOSIS — R2681 Unsteadiness on feet: Secondary | ICD-10-CM | POA: Diagnosis not present

## 2015-04-22 DIAGNOSIS — I340 Nonrheumatic mitral (valve) insufficiency: Secondary | ICD-10-CM

## 2015-04-22 DIAGNOSIS — I420 Dilated cardiomyopathy: Secondary | ICD-10-CM

## 2015-04-22 DIAGNOSIS — I10 Essential (primary) hypertension: Secondary | ICD-10-CM | POA: Diagnosis present

## 2015-04-22 DIAGNOSIS — B961 Klebsiella pneumoniae [K. pneumoniae] as the cause of diseases classified elsewhere: Secondary | ICD-10-CM | POA: Diagnosis present

## 2015-04-22 DIAGNOSIS — E785 Hyperlipidemia, unspecified: Secondary | ICD-10-CM | POA: Diagnosis present

## 2015-04-22 DIAGNOSIS — N200 Calculus of kidney: Secondary | ICD-10-CM

## 2015-04-22 DIAGNOSIS — E119 Type 2 diabetes mellitus without complications: Secondary | ICD-10-CM

## 2015-04-22 DIAGNOSIS — G9340 Encephalopathy, unspecified: Secondary | ICD-10-CM | POA: Diagnosis present

## 2015-04-22 DIAGNOSIS — Z950 Presence of cardiac pacemaker: Secondary | ICD-10-CM

## 2015-04-22 DIAGNOSIS — Z794 Long term (current) use of insulin: Secondary | ICD-10-CM

## 2015-04-22 DIAGNOSIS — M109 Gout, unspecified: Secondary | ICD-10-CM

## 2015-04-22 DIAGNOSIS — R7401 Elevation of levels of liver transaminase levels: Secondary | ICD-10-CM

## 2015-04-22 DIAGNOSIS — I071 Rheumatic tricuspid insufficiency: Secondary | ICD-10-CM

## 2015-04-22 DIAGNOSIS — I429 Cardiomyopathy, unspecified: Secondary | ICD-10-CM

## 2015-04-22 LAB — COMPREHENSIVE METABOLIC PANEL
ALT: 112 U/L — ABNORMAL HIGH (ref 14–54)
AST: 119 U/L — ABNORMAL HIGH (ref 15–41)
Albumin: 3.1 g/dL — ABNORMAL LOW (ref 3.5–5.0)
Alkaline Phosphatase: 82 U/L (ref 38–126)
Anion gap: 8 (ref 5–15)
BUN: 19 mg/dL (ref 6–20)
CO2: 26 mmol/L (ref 22–32)
Calcium: 9 mg/dL (ref 8.9–10.3)
Chloride: 100 mmol/L — ABNORMAL LOW (ref 101–111)
Creatinine, Ser: 0.95 mg/dL (ref 0.44–1.00)
GFR calc Af Amer: >60 mL/min (ref >60)
GFR calc non Af Amer: 55 mL/min — ABNORMAL LOW (ref >60)
Glucose, Bld: 100 mg/dL — ABNORMAL HIGH (ref 65–99)
Potassium: 4 mmol/L (ref 3.5–5.1)
Sodium: 134 mmol/L — ABNORMAL LOW (ref 135–145)
Total Bilirubin: 0.7 mg/dL (ref 0.3–1.2)
Total Protein: 7.4 g/dL (ref 6.5–8.1)

## 2015-04-22 LAB — URINALYSIS, ROUTINE W REFLEX MICROSCOPIC
Bilirubin Urine: NEGATIVE
Glucose, UA: NEGATIVE mg/dL
Ketones, ur: NEGATIVE mg/dL
Nitrite: NEGATIVE
Protein, ur: 100 mg/dL — AB
Specific Gravity, Urine: 1.017 (ref 1.005–1.030)
pH: 6 (ref 5.0–8.0)

## 2015-04-22 LAB — URINE MICROSCOPIC-ADD ON: Squamous Epithelial / LPF: NONE SEEN

## 2015-04-22 LAB — CBC WITH DIFFERENTIAL/PLATELET
Basophils Absolute: 0 10*3/uL (ref 0.0–0.1)
Basophils Relative: 0 %
Eosinophils Absolute: 0 10*3/uL (ref 0.0–0.7)
Eosinophils Relative: 0 %
HCT: 36.5 % (ref 36.0–46.0)
Hemoglobin: 12.1 g/dL (ref 12.0–15.0)
Lymphocytes Relative: 21 %
Lymphs Abs: 1.7 10*3/uL (ref 0.7–4.0)
MCH: 30.3 pg (ref 26.0–34.0)
MCHC: 33.2 g/dL (ref 30.0–36.0)
MCV: 91.3 fL (ref 78.0–100.0)
Monocytes Absolute: 1.2 10*3/uL — ABNORMAL HIGH (ref 0.1–1.0)
Monocytes Relative: 15 %
Neutro Abs: 5.3 10*3/uL (ref 1.7–7.7)
Neutrophils Relative %: 64 %
Platelets: 186 10*3/uL (ref 150–400)
RBC: 4 MIL/uL (ref 3.87–5.11)
RDW: 16.1 % — ABNORMAL HIGH (ref 11.5–15.5)
WBC: 8.2 10*3/uL (ref 4.0–10.5)

## 2015-04-22 LAB — I-STAT CG4 LACTIC ACID, ED: Lactic Acid, Venous: 1.09 mmol/L (ref 0.5–2.0)

## 2015-04-22 LAB — PROCALCITONIN: Procalcitonin: <0.1 ng/mL

## 2015-04-22 LAB — PROTIME-INR
INR: 1.5 — ABNORMAL HIGH (ref 0.00–1.49)
Prothrombin Time: 18.2 seconds — ABNORMAL HIGH (ref 11.6–15.2)

## 2015-04-22 LAB — LACTIC ACID, PLASMA: LACTIC ACID, VENOUS: 0.9 mmol/L (ref 0.5–2.0)

## 2015-04-22 LAB — APTT: APTT: 39 s — AB (ref 24–37)

## 2015-04-22 LAB — GLUCOSE, CAPILLARY: GLUCOSE-CAPILLARY: 102 mg/dL — AB (ref 65–99)

## 2015-04-22 MED ORDER — AZITHROMYCIN 500 MG IV SOLR
500.0000 mg | INTRAVENOUS | Status: DC
  Administered 2015-04-22 – 2015-04-24 (×3): 500 mg via INTRAVENOUS
  Filled 2015-04-22 (×4): qty 500

## 2015-04-22 MED ORDER — CEFTRIAXONE SODIUM 1 G IJ SOLR
1.0000 g | Freq: Once | INTRAMUSCULAR | Status: AC
  Administered 2015-04-22: 1 g via INTRAVENOUS
  Filled 2015-04-22: qty 10

## 2015-04-22 MED ORDER — ONDANSETRON HCL 4 MG PO TABS: 4.0000 mg | ORAL_TABLET | Freq: Four times a day (QID) | ORAL | Status: DC | PRN

## 2015-04-22 MED ORDER — SODIUM CHLORIDE 0.9 % IV SOLN
INTRAVENOUS | Status: DC
  Administered 2015-04-22 – 2015-04-24 (×3): via INTRAVENOUS

## 2015-04-22 MED ORDER — AMLODIPINE BESYLATE 5 MG PO TABS
5.0000 mg | ORAL_TABLET | Freq: Every day | ORAL | Status: DC
  Administered 2015-04-23 – 2015-04-26 (×4): 5 mg via ORAL
  Filled 2015-04-22 (×4): qty 1

## 2015-04-22 MED ORDER — DONEPEZIL HCL 5 MG PO TABS
5.0000 mg | ORAL_TABLET | Freq: Every day | ORAL | Status: DC
  Administered 2015-04-22 – 2015-04-25 (×4): 5 mg via ORAL
  Filled 2015-04-22 (×5): qty 1

## 2015-04-22 MED ORDER — BENAZEPRIL HCL 10 MG PO TABS
10.0000 mg | ORAL_TABLET | Freq: Every day | ORAL | Status: DC
  Administered 2015-04-23 – 2015-04-26 (×4): 10 mg via ORAL
  Filled 2015-04-22 (×4): qty 1

## 2015-04-22 MED ORDER — DEXTROSE 5 % IV SOLN
1.0000 g | INTRAVENOUS | Status: DC
  Administered 2015-04-23: 1 g via INTRAVENOUS
  Filled 2015-04-22 (×2): qty 10

## 2015-04-22 MED ORDER — SODIUM CHLORIDE 0.9 % IV BOLUS (SEPSIS)
1000.0000 mL | INTRAVENOUS | Status: AC
  Administered 2015-04-22 (×2): 1000 mL via INTRAVENOUS

## 2015-04-22 MED ORDER — SODIUM CHLORIDE 0.9 % IV SOLN
INTRAVENOUS | Status: DC
  Filled 2015-04-22: qty 1000

## 2015-04-22 MED ORDER — VITAMIN D3 25 MCG (1000 UNIT) PO TABS
1000.0000 [IU] | ORAL_TABLET | Freq: Every day | ORAL | Status: DC
Start: 2015-04-23 — End: 2015-04-26
  Administered 2015-04-23 – 2015-04-26 (×4): 1000 [IU] via ORAL
  Filled 2015-04-22 (×5): qty 1

## 2015-04-22 MED ORDER — ALLOPURINOL 300 MG PO TABS
300.0000 mg | ORAL_TABLET | Freq: Every day | ORAL | Status: DC
  Administered 2015-04-23 – 2015-04-26 (×4): 300 mg via ORAL
  Filled 2015-04-22 (×4): qty 1

## 2015-04-22 MED ORDER — ACETAMINOPHEN 650 MG RE SUPP: 650.0000 mg | Freq: Four times a day (QID) | RECTAL | Status: DC | PRN

## 2015-04-22 MED ORDER — SODIUM CHLORIDE 0.9% FLUSH
3.0000 mL | Freq: Two times a day (BID) | INTRAVENOUS | Status: DC
  Administered 2015-04-22 – 2015-04-26 (×2): 3 mL via INTRAVENOUS

## 2015-04-22 MED ORDER — INSULIN ASPART 100 UNIT/ML ~~LOC~~ SOLN
0.0000 [IU] | Freq: Three times a day (TID) | SUBCUTANEOUS | Status: DC
  Administered 2015-04-23: 5 [IU] via SUBCUTANEOUS
  Administered 2015-04-23: 1 [IU] via SUBCUTANEOUS
  Administered 2015-04-24 (×3): 3 [IU] via SUBCUTANEOUS
  Administered 2015-04-25 (×3): 2 [IU] via SUBCUTANEOUS
  Administered 2015-04-26 (×2): 1 [IU] via SUBCUTANEOUS

## 2015-04-22 MED ORDER — APIXABAN 5 MG PO TABS
5.0000 mg | ORAL_TABLET | Freq: Two times a day (BID) | ORAL | Status: DC
  Administered 2015-04-22 – 2015-04-26 (×8): 5 mg via ORAL
  Filled 2015-04-22 (×9): qty 1

## 2015-04-22 MED ORDER — CYANOCOBALAMIN 500 MCG PO TABS
1500.0000 ug | ORAL_TABLET | Freq: Every day | ORAL | Status: DC
  Administered 2015-04-23 – 2015-04-26 (×4): 1500 ug via ORAL
  Filled 2015-04-22 (×4): qty 3

## 2015-04-22 MED ORDER — ATORVASTATIN CALCIUM 80 MG PO TABS
80.0000 mg | ORAL_TABLET | Freq: Every day | ORAL | Status: DC
  Administered 2015-04-23 – 2015-04-24 (×2): 80 mg via ORAL
  Filled 2015-04-22 (×2): qty 1

## 2015-04-22 MED ORDER — ACETAMINOPHEN 325 MG PO TABS
650.0000 mg | ORAL_TABLET | Freq: Four times a day (QID) | ORAL | Status: DC | PRN
  Administered 2015-04-23: 650 mg via ORAL
  Filled 2015-04-22: qty 2

## 2015-04-22 MED ORDER — ONDANSETRON HCL 4 MG/2ML IJ SOLN: 4.0000 mg | Freq: Four times a day (QID) | INTRAMUSCULAR | Status: DC | PRN

## 2015-04-22 MED ORDER — AMIODARONE HCL 200 MG PO TABS
200.0000 mg | ORAL_TABLET | Freq: Every day | ORAL | Status: DC
  Administered 2015-04-23 – 2015-04-26 (×4): 200 mg via ORAL
  Filled 2015-04-22 (×4): qty 1

## 2015-04-22 MED ORDER — AMLODIPINE BESY-BENAZEPRIL HCL 5-10 MG PO CAPS: 1.0000 | ORAL_CAPSULE | Freq: Every day | ORAL | Status: DC

## 2015-04-22 NOTE — ED Notes (Signed)
Bed: WA17 Expected date:  Expected time:  Means of arrival:  Comments: EMS- 83yo F, lethargy x 3 days/confusion

## 2015-04-22 NOTE — ED Provider Notes (Signed)
CSN: [id number]     Arrival date & time 04/22/15  1452 History   First MD Initiated Contact with Patient 04/22/15 1502     Chief Complaint  Patient presents with  . Fever     (Consider location/radiation/quality/duration/timing/severity/associated sxs/prior Treatment) Patient is a 82 y.o. female presenting with altered mental status.  Altered Mental Status Presenting symptoms: behavior changes and confusion   Severity:  Severe Most recent episode:  Today Episode history:  Single Duration:  3 days Timing:  Constant Progression:  Worsening Chronicity:  New Associated symptoms: decreased appetite and fever   Associated symptoms: no abdominal pain, no depression, no headaches, no nausea, no rash, no vomiting and no weakness     Past Medical History  Diagnosis Date  . Hypertension   . Hyperlipidemia   . Diabetes mellitus without complication ([place])   . Cancer [facility])     breast, left; s/p lumpectomy/chemo/radiation   Past Surgical History  Procedure Laterality Date  . Kidney stone removal      x3  . Lumbar spine surgery      laminectomy and rod  . Cataract extraction    . Breast lumpectomy Left    Family History  Problem Relation Age of Onset  . Cancer Mother     pancreatic  . Diabetes Sister   . Hypertension Sister   . Hypertension Brother    Social History  Substance Use Topics  . Smoking status: Never Smoker   . Smokeless tobacco: Never Used  . Alcohol Use: No   OB History    No data available     Review of Systems  Constitutional: Positive for fever, appetite change, fatigue and decreased appetite.  HENT: Negative for sore throat.   Eyes: Negative for visual disturbance.  Respiratory: Negative for cough and shortness of breath.   Cardiovascular: Negative for chest pain.  Gastrointestinal: Negative for nausea, vomiting, abdominal pain and diarrhea.  Genitourinary: Negative for difficulty urinating.  Musculoskeletal: Negative for back pain and neck pain.   Skin: Negative for rash.  Neurological: Negative for syncope, weakness, numbness and headaches.  Psychiatric/Behavioral: Positive for confusion.      Allergies  Review of patient's allergies indicates no known allergies.  Home Medications   Prior to Admission medications   Medication Sig Start Date End Date Taking? Authorizing Provider  allopurinol (ZYLOPRIM) 300 MG tablet Take 300 mg by mouth daily.   Yes Historical Provider, MD  amiodarone (PACERONE) 200 MG tablet Take 200 mg by mouth daily.   Yes Historical Provider, MD  amLODipine-benazepril (LOTREL) 5-10 MG capsule Take 1 capsule by mouth daily.   Yes Historical Provider, MD  apixaban (ELIQUIS) 5 MG TABS tablet Take 1 tablet (5 mg total) by mouth 2 (two) times daily. 03/23/15  Yes Evans Lance, MD  atorvastatin (LIPITOR) 80 MG tablet Take 80 mg by mouth daily.   Yes Historical Provider, MD  Cholecalciferol (VITAMIN D-3) 1000 units CAPS Take 1,000 Units by mouth daily.   Yes Historical Provider, MD  Cyanocobalamin (B-12 PO) Take 1,500 mcg by mouth daily.   Yes Historical Provider, MD  donepezil (ARICEPT) 5 MG tablet Take 1/2 tablet daily for 2 weeks, then increase to 1 tablet daily Patient taking differently: Take 5 mg by mouth at bedtime.  02/24/15  Yes Cameron Sprang, MD  insulin detemir (LEVEMIR) 100 UNIT/ML injection Inject 6 Units into the skin at bedtime.   Yes Historical Provider, MD  insulin lispro (HUMALOG) 100 UNIT/ML injection Inject 0-10 Units  into the skin 3 (three) times daily before meals. Sliding scale   Yes Historical Provider, MD  Multiple Vitamins-Minerals (WOMENS MULTI VITAMIN & MINERAL PO) Take 1 tablet by mouth daily.    Yes Historical Provider, MD  amLODipine (NORVASC) 5 MG tablet Take 1 tablet (5 mg total) by mouth daily. Patient not taking: Reported on 04/22/2015 03/23/15   Binnie Rail, MD   BP 157/56 mmHg  Pulse 59  Temp(Src) 100.9 F (38.3 C) (Oral)  Resp 28  Ht 5' (1.524 m)  Wt 132 lb 4.4 oz (60 kg)   BMI 25.83 kg/m2  SpO2 100% Physical Exam  Constitutional: She is oriented to person, place, and time. She appears well-developed and well-nourished. She appears ill. No distress.  HENT:  Head: Normocephalic and atraumatic.  Mouth/Throat: Mucous membranes are dry.  Eyes: Conjunctivae and EOM are normal.  Neck: Normal range of motion.  Cardiovascular: Normal rate, regular rhythm, normal heart sounds and intact distal pulses.  Exam reveals no gallop and no friction rub.   No murmur heard. Pulmonary/Chest: Effort normal and breath sounds normal. No respiratory distress. She has no wheezes. She has no rales.  Abdominal: Soft. She exhibits no distension. There is no tenderness. There is no guarding.  Musculoskeletal: She exhibits no edema or tenderness.  Neurological: She is alert and oriented to person, place, and time. She has normal strength. No cranial nerve deficit (mild slurred speech however tongue and palate midline) or sensory deficit.  Oriented to self and location Not oriented to date, sister reports this waxes and wanes  Skin: Skin is warm and dry. No rash noted. She is not diaphoretic. No erythema.  Nursing note and vitals reviewed.   ED Course  Procedures (including critical care time) Labs Review Labs Reviewed  COMPREHENSIVE METABOLIC PANEL - Abnormal; Notable for the following:    Sodium 134 (*)    Chloride 100 (*)    Glucose, Bld 100 (*)    Albumin 3.1 (*)    AST 119 (*)    ALT 112 (*)    GFR calc non Af Amer 55 (*)    All other components within normal limits  CBC WITH DIFFERENTIAL/PLATELET - Abnormal; Notable for the following:    RDW 16.1 (*)    Monocytes Absolute 1.2 (*)    All other components within normal limits  URINALYSIS, ROUTINE W REFLEX MICROSCOPIC (NOT AT [facility]) - Abnormal; Notable for the following:    APPearance TURBID (*)    Hgb urine dipstick LARGE (*)    Protein, ur 100 (*)    Leukocytes, UA LARGE (*)    All other components within normal  limits  URINE MICROSCOPIC-ADD ON - Abnormal; Notable for the following:    Bacteria, UA MANY (*)    All other components within normal limits  CBC WITH DIFFERENTIAL/PLATELET - Abnormal; Notable for the following:    RBC 3.73 (*)    Hemoglobin 11.4 (*)    HCT 34.2 (*)    RDW 16.1 (*)    All other components within normal limits  COMPREHENSIVE METABOLIC PANEL - Abnormal; Notable for the following:    Glucose, Bld 137 (*)    Calcium 8.3 (*)    Albumin 2.6 (*)    AST 109 (*)    ALT 111 (*)    GFR calc non Af Amer 58 (*)    All other components within normal limits  PROTIME-INR - Abnormal; Notable for the following:    Prothrombin Time 18.2 (*)  INR 1.50 (*)    All other components within normal limits  APTT - Abnormal; Notable for the following:    aPTT 39 (*)    All other components within normal limits  GLUCOSE, CAPILLARY - Abnormal; Notable for the following:    Glucose-Capillary 102 (*)    All other components within normal limits  CULTURE, BLOOD (ROUTINE X 2)  CULTURE, BLOOD (ROUTINE X 2)  URINE CULTURE  CULTURE, BLOOD (ROUTINE X 2)  CULTURE, BLOOD (ROUTINE X 2)  LACTIC ACID, PLASMA  PROCALCITONIN  HEMOGLOBIN A1C  LACTIC ACID, PLASMA  STREP PNEUMONIAE URINARY ANTIGEN  INFLUENZA PANEL BY PCR (TYPE A & B, H1N1)  I-STAT CG4 LACTIC ACID, ED    Imaging Review Dg Chest 2 View  04/22/2015  CLINICAL DATA:  Fever, weakness, lethargy EXAM: CHEST  2 VIEW COMPARISON:  None. FINDINGS: Cardiomegaly is noted. Dual lead cardiac pacemaker with leads in right atrium and right ventricle. There is bilateral infrahilar hazy atelectasis or infiltrate. No convincing pulmonary edema. Atherosclerotic calcifications of thoracic aorta. IMPRESSION: Cardiomegaly. Dual lead cardiac pacemaker in place. Bilateral infrahilar hazy atelectasis or infiltrate. No convincing pulmonary edema. Electronically Signed   By: Lahoma Crocker M.D.   On: 04/22/2015 15:34   US Abdomen Limited Ruq  04/22/2015  CLINICAL  DATA:  82 year old female with elevated liver function test. EXAM: US ABDOMEN LIMITED - RIGHT UPPER QUADRANT COMPARISON:  CT dated 03/10/2015 FINDINGS: Gallbladder: There are multiple stones within the gallbladder. There is stone at the gallbladder neck. There is no gallbladder wall thickening or pericholecystic fluid. Negative sonographic Murphy's sign. Common bile duct: Diameter: 7 mm Liver: There is diffuse increased hepatic echogenicity compatible with fatty infiltration. Small scattered calcifications likely represent old granuloma. IMPRESSION: Cholelithiasis without sonographic evidence of acute cholecystitis. Fatty liver. Electronically Signed   By: Anner Crete M.D.   On: 04/22/2015 23:05   I have personally reviewed and evaluated these images and lab results as part of my medical decision-making.   EKG Interpretation None      MDM   Final diagnoses:  Urinary tract infection without hematuria, site unspecified  Delirium   82 year old female with a history of recent breast cancer, hypertension, hyperlipidemia, diabetes, early dementia presents with concern for altered mental status for 3 days and fever.Patient febrile on arrival to the emergency department, blood cultures, lactic acid, 30 mL/kg normal saline, and Rocephin ordered. Urinalysis is concerning for urinary tract infection. Patient has questionable interstitial markings on x-ray, however history is not consistent with pneumonia. Her neurologic exam is nonfocal, and feel the increased confusion is likely secondary to sepsis secondary to urinary source.  No hx of trauma/HA.  Patient to be admitted for further care.   Gareth Morgan, MD 04/23/15 [phone]

## 2015-04-22 NOTE — Progress Notes (Signed)
Utilization Review completed.  Neeta Storey RN CM  

## 2015-04-22 NOTE — Telephone Encounter (Signed)
[place]  Patient Name: Allison Dunn  DOB: 10-14-1931    Initial Comment Caller states she is the caregiver of the patient- pt has a heart condition and diabetes- breathing heavy, not walking or eating much   Nurse Assessment  Nurse: Genoveva Ill, RN, Allison Dunn Date/Time (Eastern Time): 04/22/2015 2:03:47 PM  Confirm and document reason for call. If symptomatic, describe symptoms. You must click the next button to save text entered. ---Caller states she is the caregiver of the patient with pt on the line - pt has a heart condition and diabetes- breathing heavy, not walking or eating much; had been walking around up until a few days ago; out of breath with walking; confused (thinks president is Allison Dunn); last BS 140  Has the patient traveled out of the country within the last 30 days? ---Not Applicable  Does the patient have any new or worsening symptoms? ---Yes  Will a triage be completed? ---Yes  Related visit to physician within the last 2 weeks? ---N/A  Does the PT have any chronic conditions? (i.e. diabetes, asthma, etc.) ---Yes  List chronic conditions. ---heart condition, diabetes  Is this a behavioral health or substance abuse call? ---No     Guidelines    Guideline Title Affirmed Question Affirmed Notes  Confusion - Delirium [1] Difficult to awaken or acting confused (disoriented, slurred speech) AND [2] present now AND [3] new onset    Final Disposition User   Call EMS 911 Now [place], RN, Allison Dunn    Disagree/Comply: Comply

## 2015-04-22 NOTE — ED Notes (Signed)
From home via [facility], increasingly lethargic X 3 days, fever, weakness, no V/D, VSS, CBG 118

## 2015-04-22 NOTE — H&P (Signed)
History and Physical  Allison Dunn LAG:[id number] DOB: August 17, 1931 DOA: 04/22/2015   PCP: Binnie Rail, MD  Referring Physician: ED/ Dr. Billy Fischer  Chief Complaint: delirium  HPI:   82 year old female with a history of cognitive impairment, sick sinus syndrome status post permanent pacemaker, hypertension, atrial fibrillation on apixiban, diabetes mellitus presented with 3 day history of increasing confusion, poor oral intake, and shortness of breath. The patient's sister is at the bedside supplementing history. According to the patient's sister, the patient was moved from [place] to [place] in December 2016 secondary to worsening cognitive impairment to the point where the patient was getting lost and forgetting to pay bills. The patient has been in her usual state of health up until 3 days prior to this admission when she began having increasing confusion and generalized weakness which has progressed to the point where she has not been able to get up out of bed for the past 2 days prior to admission. The patient has also had poor oral intake. As result, she was brought to the emergency department for further evaluation. There've been no complaints of headaches, chest pain, respiratory distress, vomiting, diarrhea, abdominal pain, dysuria. The patient normally uses a cane.  In the emergency department, the patient was noted to have sodium 134 with elevated hepatic enzymes. AST 119, ALT 112, alkaline phosphatase 82, total bilirubin 0.7. She had a fever of 102.56F, blood pressure 138/58. Urinalysis showed TNTC WBC. Assessment/Plan: Sepsis  -Secondary to urinary source and possibly pneumonia  -Chest x-ray shows R>L basilar opacity -Start ceftriaxone and azithromycin  -IV fluid bolus given in the emergency department  -Continue maintenance fluids  -Blood cultures  -Follow up urine culture  -Trend lactic acid -Trend procalcitonin Acute encephalopathy -due to infectious process  in the setting of underlying dementia Pyuria  -Continue antibiotics pending urine culture  CAP -The patient has R>L pulmonary infiltrate -check influenza PCR  -continue empiric ceftriaxone and azithromycin  Transaminasemia -Likely due to sepsis -Right upper quadrant ultrasound Paroxysmal atrial fibrillation -Continue apixiban -Continue amiodarone Symptomatic bradycardia -Status post permanent pacemaker Diabetes mellitus type 2 -Hemoglobin A1c -NovoLog sliding scale Hypertension -Continue amlodipine/benazepril Cognitive impairment/dementia -Continue Aricept        Past Medical History  Diagnosis Date  . Hypertension   . Hyperlipidemia   . Diabetes mellitus without complication (Hand)   . Cancer [facility])     breast, left; s/p lumpectomy/chemo/radiation   Past Surgical History  Procedure Laterality Date  . Kidney stone removal      x3  . Lumbar spine surgery      laminectomy and rod  . Cataract extraction    . Breast lumpectomy Left    Social History:  reports that she has never smoked. She has never used smokeless tobacco. She reports that she does not drink alcohol or use illicit drugs.   Family History  Problem Relation Age of Onset  . Cancer Mother     pancreatic  . Diabetes Sister   . Hypertension Sister   . Hypertension Brother      No Known Allergies    Prior to Admission medications   Medication Sig Start Date End Date Taking? Authorizing Provider  allopurinol (ZYLOPRIM) 300 MG tablet Take 300 mg by mouth daily.   Yes Historical Provider, MD  amiodarone (PACERONE) 200 MG tablet Take 200 mg by mouth daily.   Yes Historical Provider, MD  amLODipine-benazepril (LOTREL) 5-10 MG capsule Take 1 capsule by mouth daily.  Yes Historical Provider, MD  apixaban (ELIQUIS) 5 MG TABS tablet Take 1 tablet (5 mg total) by mouth 2 (two) times daily. 03/23/15  Yes Evans Lance, MD  atorvastatin (LIPITOR) 80 MG tablet Take 80 mg by mouth daily.   Yes Historical  Provider, MD  Cholecalciferol (VITAMIN D-3) 1000 units CAPS Take 1,000 Units by mouth daily.   Yes Historical Provider, MD  Cyanocobalamin (B-12 PO) Take 1,500 mcg by mouth daily.   Yes Historical Provider, MD  donepezil (ARICEPT) 5 MG tablet Take 1/2 tablet daily for 2 weeks, then increase to 1 tablet daily Patient taking differently: Take 5 mg by mouth at bedtime.  02/24/15  Yes Cameron Sprang, MD  insulin detemir (LEVEMIR) 100 UNIT/ML injection Inject 6 Units into the skin at bedtime.   Yes Historical Provider, MD  insulin lispro (HUMALOG) 100 UNIT/ML injection Inject 0-10 Units into the skin 3 (three) times daily before meals. Sliding scale   Yes Historical Provider, MD  Multiple Vitamins-Minerals (WOMENS MULTI VITAMIN & MINERAL PO) Take 1 tablet by mouth daily.    Yes Historical Provider, MD  amLODipine (NORVASC) 5 MG tablet Take 1 tablet (5 mg total) by mouth daily. Patient not taking: Reported on 04/22/2015 03/23/15   Binnie Rail, MD    Review of Systems:  Unobtainable secondary to the patient's mental status  Physical Exam: Filed Vitals:   04/22/15 1501 04/22/15 1509 04/22/15 1729  BP: 138/58  139/53  Pulse: 70  60  Temp:  102.4 F (39.1 C)   TempSrc:  Rectal   Resp: 26  12  SpO2: 99%  95%   General:  Alert and awake, NAD, nontoxic, pleasant/cooperative Head/Eye: No conjunctival hemorrhage, no icterus, [place]/AT, No nystagmus ENT:  No icterus,  No thrush, good dentition, no pharyngeal exudate Neck:  No masses, no lymphadenpathy, no bruits CV:  RRR, no rub, no gallop, no S3 Lung:  Bibasilar crackles, right greater than left. No wheezing.  Abdomen: soft/NT, +BS, nondistended, no peritoneal signs Ext: No cyanosis, No rashes, No petechiae, No lymphangitis, No edema Neuro: CNII-XII intact, strength 4/5 in bilateral upper and lower extremities, no dysmetria  Labs on Admission:  Basic Metabolic Panel:  Recent Labs Lab 04/22/15 1337  NA 134*  K 4.0  CL 100*  CO2 26  GLUCOSE 100*   BUN 19  CREATININE 0.95  CALCIUM 9.0   Liver Function Tests:  Recent Labs Lab 04/22/15 1337  AST 119*  ALT 112*  ALKPHOS 82  BILITOT 0.7  PROT 7.4  ALBUMIN 3.1*   No results for input(s): LIPASE, AMYLASE in the last 168 hours. No results for input(s): AMMONIA in the last 168 hours. CBC:  Recent Labs Lab 04/22/15 1337  WBC 8.2  NEUTROABS 5.3  HGB 12.1  HCT 36.5  MCV 91.3  PLT 186   Cardiac Enzymes: No results for input(s): CKTOTAL, CKMB, CKMBINDEX, TROPONINI in the last 168 hours. BNP: Invalid input(s): POCBNP CBG: No results for input(s): GLUCAP in the last 168 hours.  Radiological Exams on Admission: Dg Chest 2 View  04/22/2015  CLINICAL DATA:  Fever, weakness, lethargy EXAM: CHEST  2 VIEW COMPARISON:  None. FINDINGS: Cardiomegaly is noted. Dual lead cardiac pacemaker with leads in right atrium and right ventricle. There is bilateral infrahilar hazy atelectasis or infiltrate. No convincing pulmonary edema. Atherosclerotic calcifications of thoracic aorta. IMPRESSION: Cardiomegaly. Dual lead cardiac pacemaker in place. Bilateral infrahilar hazy atelectasis or infiltrate. No convincing pulmonary edema. Electronically Signed   By: Julien Girt  Pop M.D.   On: 04/22/2015 15:34    EKG: Independently reviewed. pending    Time spent:60 minutes Code Status:   FULL Family Communication:   Sister updated at bedside   Eldrige Pitkin, DO  [facility] Pager [phone]  If 7PM-7AM, please contact night-coverage [email or web address] Password [facility] 04/22/2015, 6:38 PM

## 2015-04-23 ENCOUNTER — Encounter (HOSPITAL_COMMUNITY): Payer: Self-pay | Admitting: *Deleted

## 2015-04-23 ENCOUNTER — Telehealth: Payer: Self-pay

## 2015-04-23 DIAGNOSIS — I6320 Cerebral infarction due to unspecified occlusion or stenosis of unspecified precerebral arteries: Secondary | ICD-10-CM

## 2015-04-23 DIAGNOSIS — E119 Type 2 diabetes mellitus without complications: Secondary | ICD-10-CM

## 2015-04-23 DIAGNOSIS — R2681 Unsteadiness on feet: Secondary | ICD-10-CM

## 2015-04-23 DIAGNOSIS — M6281 Muscle weakness (generalized): Secondary | ICD-10-CM

## 2015-04-23 DIAGNOSIS — I10 Essential (primary) hypertension: Secondary | ICD-10-CM

## 2015-04-23 LAB — COMPREHENSIVE METABOLIC PANEL
ALT: 111 U/L — AB (ref 14–54)
ANION GAP: 8 (ref 5–15)
AST: 109 U/L — ABNORMAL HIGH (ref 15–41)
Albumin: 2.6 g/dL — ABNORMAL LOW (ref 3.5–5.0)
Alkaline Phosphatase: 73 U/L (ref 38–126)
BUN: 16 mg/dL (ref 6–20)
CHLORIDE: 104 mmol/L (ref 101–111)
CO2: 24 mmol/L (ref 22–32)
Calcium: 8.3 mg/dL — ABNORMAL LOW (ref 8.9–10.3)
Creatinine, Ser: 0.9 mg/dL (ref 0.44–1.00)
GFR, EST NON AFRICAN AMERICAN: 58 mL/min — AB (ref >60)
Glucose, Bld: 137 mg/dL — ABNORMAL HIGH (ref 65–99)
POTASSIUM: 3.7 mmol/L (ref 3.5–5.1)
SODIUM: 136 mmol/L (ref 135–145)
Total Bilirubin: 0.4 mg/dL (ref 0.3–1.2)
Total Protein: 6.5 g/dL (ref 6.5–8.1)

## 2015-04-23 LAB — GLUCOSE, CAPILLARY
GLUCOSE-CAPILLARY: 96 mg/dL (ref 65–99)
GLUCOSE-CAPILLARY: 149 mg/dL — AB (ref 65–99)
Glucose-Capillary: 262 mg/dL — ABNORMAL HIGH (ref 65–99)
Glucose-Capillary: 241 mg/dL — ABNORMAL HIGH (ref 65–99)

## 2015-04-23 LAB — CBC WITH DIFFERENTIAL/PLATELET
BASOS ABS: 0 10*3/uL (ref 0.0–0.1)
BASOS PCT: 0 %
EOS PCT: 0 %
Eosinophils Absolute: 0 10*3/uL (ref 0.0–0.7)
HCT: 34.2 % — ABNORMAL LOW (ref 36.0–46.0)
HEMOGLOBIN: 11.4 g/dL — AB (ref 12.0–15.0)
LYMPHS PCT: 21 %
Lymphs Abs: 1.5 10*3/uL (ref 0.7–4.0)
MCH: 30.6 pg (ref 26.0–34.0)
MCHC: 33.3 g/dL (ref 30.0–36.0)
MCV: 91.7 fL (ref 78.0–100.0)
MONOS PCT: 13 %
Monocytes Absolute: 0.9 10*3/uL (ref 0.1–1.0)
NEUTROS ABS: 4.6 10*3/uL (ref 1.7–7.7)
Neutrophils Relative %: 66 %
Platelets: 156 10*3/uL (ref 150–400)
RBC: 3.73 MIL/uL — ABNORMAL LOW (ref 3.87–5.11)
RDW: 16.1 % — AB (ref 11.5–15.5)
WBC Morphology: INCREASED
WBC: 7 10*3/uL (ref 4.0–10.5)

## 2015-04-23 LAB — INFLUENZA PANEL BY PCR (TYPE A & B)
H1N1FLUPCR: NOT DETECTED
INFLAPCR: NEGATIVE
INFLBPCR: NEGATIVE

## 2015-04-23 LAB — LACTIC ACID, PLASMA: LACTIC ACID, VENOUS: 0.6 mmol/L (ref 0.5–2.0)

## 2015-04-23 MED ORDER — ENSURE ENLIVE PO LIQD
237.0000 mL | Freq: Two times a day (BID) | ORAL | Status: DC
  Administered 2015-04-23 – 2015-04-26 (×4): 237 mL via ORAL

## 2015-04-23 MED ORDER — CAMPHOR-MENTHOL 0.5-0.5 % EX LOTN
1.0000 "application " | TOPICAL_LOTION | CUTANEOUS | Status: DC | PRN
  Filled 2015-04-23: qty 222

## 2015-04-23 NOTE — Progress Notes (Signed)
Initial Nutrition Assessment  DOCUMENTATION CODES:   Not applicable  INTERVENTION:  - Will order Ensure Enlive po BID, each supplement provides 350 kcal and 20 grams of protein - Encourage PO intakes of meals and supplements - RD will continue to monitor for needs  NUTRITION DIAGNOSIS:   Inadequate oral intake related to lethargy/confusion, poor appetite as evidenced by meal completion < 25%  GOAL:   Patient will meet greater than or equal to 90% of their needs  MONITOR:   PO intake, Supplement acceptance, Weight trends, Labs, I & O's  REASON FOR ASSESSMENT:   Malnutrition Screening Tool  ASSESSMENT:   82 year old female with a history of cognitive impairment, sick sinus syndrome status post permanent pacemaker, hypertension, atrial fibrillation on apixiban, diabetes mellitus presented with 3 day history of increasing confusion, poor oral intake, and shortness of breath. The patient's sister is at the bedside supplementing history. According to the patient's sister, the patient was moved from [place] to [place] in December 2016 secondary to worsening cognitive impairment to the point where the patient was getting lost and forgetting to pay bills. The patient has been in her usual state of health up until 3 days prior to this admission when she began having increasing confusion and generalized weakness which has progressed to the point where she has not been able to get up out of bed for the past 2 days prior to admission.  Pt seen for MST. BMI indicates overweight status. Pt took 2-3 bites of breakfast this AM. She reports that she does not want anything more to eat at this time and that she has had a poor appetite for 1 day PTA. She denies any nausea or pain now or PTA. Pt states she is unable to recall any other information at this time and requests ability to rest as she is feeling drowsy. No family/visitors present in the room.   Physical assessment shows mild muscle  wasting to upper body and moderate muscle wasting to lower body, no fat wasting, and no edema. Per chart review, pt has lost 5 lbs (4% body weight) in the past 2 months which is not significant for time frame. Notes indicate recent dx of breast cancer.  Not meeting needs. Will order Ensure Enlive BID. Medications reviewed. Labs reviewed; Ca: 8.3 mg/dL.   Diet Order:  Diet Heart Room service appropriate?: Yes; Fluid consistency:: Thin  Skin:  Reviewed, no issues  Last BM:  PTA  Height:   Ht Readings from Last 1 Encounters:  04/22/15 5' (1.524 m)    Weight:   Wt Readings from Last 1 Encounters:  04/22/15 132 lb 4.4 oz (60 kg)    Ideal Body Weight:  45.45 kg (kg)  BMI:  Body mass index is 25.83 kg/(m^2).  Estimated Nutritional Needs:   Kcal:  1800-2000  Protein:  70-80 grams  Fluid:  2 L/day  EDUCATION NEEDS:   No education needs identified at this time     Jarome Matin, RD, LDN Inpatient Clinical Dietitian Pager # [phone] After hours/weekend pager # [phone]

## 2015-04-23 NOTE — Telephone Encounter (Signed)
Faxed back to [facility].  Copy sent to scan and copy placed in Dahlia's office

## 2015-04-23 NOTE — Progress Notes (Signed)
PROGRESS NOTE  Allison Dunn QIO:[id number] DOB: 1931-12-10 DOA: 04/22/2015 PCP: Binnie Rail, MD  HPI:  82 year old female with a history of cognitive impairment, sick sinus syndrome status post permanent pacemaker, hypertension, atrial fibrillation on apixiban, diabetes mellitus presented with 3 day history of increasing confusion, poor oral intake, and shortness of breath. The patient's sister is at the bedside supplementing history. According to the patient's sister, the patient was moved from [place] to [place] in December 2016 secondary to worsening cognitive impairment to the point where the patient was getting lost and forgetting to pay bills. The patient has been in her usual state of health up until 3 days prior to this admission when she began having increasing confusion and generalized weakness which has progressed to the point where she has not been able to get up out of bed for the past 2 days prior to admission. The patient has also had poor oral intake. As result, she was brought to the emergency department for further evaluation. There've been no complaints of headaches, chest pain, respiratory distress, vomiting, diarrhea, abdominal pain, dysuria. The patient normally uses a cane.  In the emergency department, the patient was noted to have sodium 134 with elevated hepatic enzymes. AST 119, ALT 112, alkaline phosphatase 82, total bilirubin 0.7. She had a fever of 102.9F, blood pressure 138/58. Urinalysis showed TNTC WBC. Assessment/Plan: Sepsis  -Secondary to urinary source and possibly pneumonia  -Chest x-ray shows R>L basilar opacity -Continue ceftriaxone and azithromycin  -IV fluid bolus given in the emergency department  -Saline lock IVF fluids  -Blood cultures--neg to date -Follow up urine culture-->GNR -Trend lactic acid 1.09-->0.6 -Trend procalcitonin--NEG Acute encephalopathy -due to infectious process in the setting of underlying  dementia UTI -Continue antibiotics pending urine culture  CAP -The patient has R>L pulmonary infiltrate -check influenza PCR --NEG -continue ceftriaxone and azithromycin  Transaminasemia -Likely due to sepsis -Right upper quadrant ultrasound--cholelithiasis without cholecystitis; hepatic steatosis Paroxysmal atrial fibrillation -Continue apixiban -Continue amiodarone Symptomatic bradycardia -Status post permanent pacemaker Diabetes mellitus type 2 -Hemoglobin A1c--pending -NovoLog sliding scale Hypertension -Continue amlodipine/benazepril Cognitive impairment/dementia -Continue Aricept   Family Communication:   Sister updated at beside Disposition Plan:   Home 3/4 or 3/5     Procedures/Studies: Dg Chest 2 View  04/22/2015  CLINICAL DATA:  Fever, weakness, lethargy EXAM: CHEST  2 VIEW COMPARISON:  None. FINDINGS: Cardiomegaly is noted. Dual lead cardiac pacemaker with leads in right atrium and right ventricle. There is bilateral infrahilar hazy atelectasis or infiltrate. No convincing pulmonary edema. Atherosclerotic calcifications of thoracic aorta. IMPRESSION: Cardiomegaly. Dual lead cardiac pacemaker in place. Bilateral infrahilar hazy atelectasis or infiltrate. No convincing pulmonary edema. Electronically Signed   By: Lahoma Crocker M.D.   On: 04/22/2015 15:34   Ct Head Wo Contrast  04/14/2015  CLINICAL DATA:  Loss of peripheral vision on the right, weakness on the right side, slow gait over the last week EXAM: CT HEAD WITHOUT CONTRAST TECHNIQUE: Contiguous axial images were obtained from the base of the skull through the vertex without intravenous contrast. COMPARISON:  CT brain scan of 03/04/2015 FINDINGS: The ventricular system is unchanged in size and configuration, and mild cortical atrophy again is noted. Mild to moderate small vessel ischemic change throughout the periventricular white matter again is noted. No hemorrhage, mass lesion, or acute infarction is seen. On bone  window images, the paranasal sinuses are well pneumatized. No calvarial abnormality is seen. IMPRESSION: 1.  Cortical atrophy and mild to moderate small vessel ischemic change throughout the periventricular white matter. 2. No acute intracranial abnormality. Electronically Signed   By: Ivar Drape M.D.   On: 04/14/2015 15:04   US Abdomen Limited Ruq  04/22/2015  CLINICAL DATA:  82 year old female with elevated liver function test. EXAM: US ABDOMEN LIMITED - RIGHT UPPER QUADRANT COMPARISON:  CT dated 03/10/2015 FINDINGS: Gallbladder: There are multiple stones within the gallbladder. There is stone at the gallbladder neck. There is no gallbladder wall thickening or pericholecystic fluid. Negative sonographic Murphy's sign. Common bile duct: Diameter: 7 mm Liver: There is diffuse increased hepatic echogenicity compatible with fatty infiltration. Small scattered calcifications likely represent old granuloma. IMPRESSION: Cholelithiasis without sonographic evidence of acute cholecystitis. Fatty liver. Electronically Signed   By: Anner Crete M.D.   On: 04/22/2015 23:05         Subjective: Patient denies fevers, chills, headache, chest pain, dyspnea, nausea, vomiting, diarrhea, abdominal pain, dysuria, hematuria   Objective: Filed Vitals:   04/23/15 0641 04/23/15 0858 04/23/15 1000 04/23/15 1531  BP: 125/53  137/52 127/57  Pulse: 59 76 60 59  Temp: 97.5 F (36.4 C)  97.4 F (36.3 C) 97.4 F (36.3 C)  TempSrc: Oral  Oral Oral  Resp: '18  18 18  '$ Height:      Weight:      SpO2: 100%  100% 99%    Intake/Output Summary (Last 24 hours) at 04/23/15 1634 Last data filed at 04/23/15 1500  Gross per 24 hour  Intake 1121.25 ml  Output    400 ml  Net 721.25 ml   Weight change:  Exam:   General:  Pt is alert, follows commands appropriately, not in acute distress  HEENT: No icterus, No thrush, No neck mass, [place]/AT  Cardiovascular: RRR, S1/S2, no rubs, no gallops  Respiratory: Bibasilar  crackles, right greater than left. No wheezing. Good air movement  Abdomen: Soft/+BS, non tender, non distended, no guarding  Extremities: No edema, No lymphangitis, No petechiae, No rashes, no synovitis  Data Reviewed: Basic Metabolic Panel:  Recent Labs Lab 04/22/15 1337 04/22/15 2245  NA 134* 136  K 4.0 3.7  CL 100* 104  CO2 26 24  GLUCOSE 100* 137*  BUN 19 16  CREATININE 0.95 0.90  CALCIUM 9.0 8.3*   Liver Function Tests:  Recent Labs Lab 04/22/15 1337 04/22/15 2245  AST 119* 109*  ALT 112* 111*  ALKPHOS 82 73  BILITOT 0.7 0.4  PROT 7.4 6.5  ALBUMIN 3.1* 2.6*   No results for input(s): LIPASE, AMYLASE in the last 168 hours. No results for input(s): AMMONIA in the last 168 hours. CBC:  Recent Labs Lab 04/22/15 1337 04/22/15 2245  WBC 8.2 7.0  NEUTROABS 5.3 4.6  HGB 12.1 11.4*  HCT 36.5 34.2*  MCV 91.3 91.7  PLT 186 156   Cardiac Enzymes: No results for input(s): CKTOTAL, CKMB, CKMBINDEX, TROPONINI in the last 168 hours. BNP: Invalid input(s): POCBNP CBG:  Recent Labs Lab 04/22/15 2319 04/23/15 0753 04/23/15 1138  GLUCAP 102* 96 149*    Recent Results (from the past 240 hour(s))  Urine culture     Status: None (Preliminary result)   Collection Time: 04/22/15  3:14 PM  Result Value Ref Range Status   Specimen Description URINE, CATHETERIZED  Final   Special Requests NONE  Final   Culture   Final    >=100,000 COLONIES/mL GRAM NEGATIVE RODS Performed at [facility]    Report Status PENDING  Incomplete  Culture, blood (routine x 2)     Status: None (Preliminary result)   Collection Time: 04/22/15  4:09 PM  Result Value Ref Range Status   Specimen Description BLOOD LEFT HAND  Final   Special Requests BOTTLES DRAWN AEROBIC ONLY 5CC  Final   Culture   Final    NO GROWTH < 24 HOURS Performed at [facility]    Report Status PENDING  Incomplete  Culture, blood (routine x 2)     Status: None (Preliminary result)   Collection  Time: 04/22/15  4:09 PM  Result Value Ref Range Status   Specimen Description BLOOD LEFT FOREARM  Final   Special Requests IN PEDIATRIC BOTTLE [place]  Final   Culture   Final    NO GROWTH < 24 HOURS Performed at [facility]    Report Status PENDING  Incomplete     Scheduled Meds: . allopurinol  300 mg Oral Daily  . amiodarone  200 mg Oral Daily  . amLODipine  5 mg Oral Daily   And  . benazepril  10 mg Oral Daily  . apixaban  5 mg Oral BID  . atorvastatin  80 mg Oral Daily  . azithromycin  500 mg Intravenous Q24H  . cefTRIAXone (ROCEPHIN)  IV  1 g Intravenous Q24H  . cholecalciferol  1,000 Units Oral Daily  . cyanocobalamin  1,500 mcg Oral Daily  . donepezil  5 mg Oral QHS  . feeding supplement (ENSURE ENLIVE)  237 mL Oral BID BM  . insulin aspart  0-9 Units Subcutaneous TID WC  . sodium chloride flush  3 mL Intravenous Q12H   Continuous Infusions: . sodium chloride 75 mL/hr at 04/23/15 1229     Keymon Mcelroy, DO  [facility] Pager [phone]  If 7PM-7AM, please contact night-coverage [email or web address] Password [email or web address] 04/23/2015, 4:34 PM   LOS: 1 day

## 2015-04-23 NOTE — Telephone Encounter (Signed)
Received HH cert and plan of care from [facility].   Form given to PCP to sign.

## 2015-04-24 DIAGNOSIS — R7401 Elevation of levels of liver transaminase levels: Secondary | ICD-10-CM | POA: Insufficient documentation

## 2015-04-24 DIAGNOSIS — R740 Nonspecific elevation of levels of transaminase and lactic acid dehydrogenase [LDH]: Secondary | ICD-10-CM

## 2015-04-24 DIAGNOSIS — A414 Sepsis due to anaerobes: Secondary | ICD-10-CM

## 2015-04-24 LAB — CBC
HCT: 35 % — ABNORMAL LOW (ref 36.0–46.0)
Hemoglobin: 11.7 g/dL — ABNORMAL LOW (ref 12.0–15.0)
MCH: 30.4 pg (ref 26.0–34.0)
MCHC: 33.4 g/dL (ref 30.0–36.0)
MCV: 90.9 fL (ref 78.0–100.0)
PLATELETS: 206 10*3/uL (ref 150–400)
RBC: 3.85 MIL/uL — ABNORMAL LOW (ref 3.87–5.11)
RDW: 16.2 % — AB (ref 11.5–15.5)
WBC: 7.3 10*3/uL (ref 4.0–10.5)

## 2015-04-24 LAB — COMPREHENSIVE METABOLIC PANEL
ALBUMIN: 2.3 g/dL — AB (ref 3.5–5.0)
ALT: 132 U/L — ABNORMAL HIGH (ref 14–54)
AST: 111 U/L — AB (ref 15–41)
Alkaline Phosphatase: 81 U/L (ref 38–126)
Anion gap: 7 (ref 5–15)
BUN: 18 mg/dL (ref 6–20)
CHLORIDE: 103 mmol/L (ref 101–111)
CO2: 23 mmol/L (ref 22–32)
Calcium: 8.1 mg/dL — ABNORMAL LOW (ref 8.9–10.3)
Creatinine, Ser: 0.77 mg/dL (ref 0.44–1.00)
GFR calc Af Amer: >60 mL/min (ref >60)
Glucose, Bld: 244 mg/dL — ABNORMAL HIGH (ref 65–99)
POTASSIUM: 3.9 mmol/L (ref 3.5–5.1)
Sodium: 133 mmol/L — ABNORMAL LOW (ref 135–145)
Total Bilirubin: 0.5 mg/dL (ref 0.3–1.2)
Total Protein: 6.1 g/dL — ABNORMAL LOW (ref 6.5–8.1)

## 2015-04-24 LAB — URINE CULTURE: Culture: >=100000

## 2015-04-24 LAB — GLUCOSE, CAPILLARY
GLUCOSE-CAPILLARY: 213 mg/dL — AB (ref 65–99)
GLUCOSE-CAPILLARY: 210 mg/dL — AB (ref 65–99)
GLUCOSE-CAPILLARY: 227 mg/dL — AB (ref 65–99)
Glucose-Capillary: 216 mg/dL — ABNORMAL HIGH (ref 65–99)

## 2015-04-24 LAB — HEMOGLOBIN A1C
Hgb A1c MFr Bld: 7.5 % — ABNORMAL HIGH (ref 4.8–5.6)
Mean Plasma Glucose: 169 mg/dL

## 2015-04-24 MED ORDER — CEFUROXIME AXETIL 500 MG PO TABS
500.0000 mg | ORAL_TABLET | Freq: Two times a day (BID) | ORAL | Status: DC
  Administered 2015-04-24 – 2015-04-26 (×5): 500 mg via ORAL
  Filled 2015-04-24 (×5): qty 1

## 2015-04-24 NOTE — Evaluation (Signed)
Physical Therapy Evaluation Patient Details Name: Allison Dunn MRN: [id number] DOB: 11/04/1931 Today's Date: 04/24/2015   History of Present Illness  82 yo female admitted with sepsis. Hx of cognitive impairment, SSS, pacemaker HTN, A fib, DM.   Clinical Impression  On eval, pt required Min assist for mobility-walked ~5 feet in room with walker and performed stand pivot from bed to bsc. No family present during session-unable to confirm PLOF/home environment info. Recommend SNF at this time unless pt will have assistance at home.     Follow Up Recommendations SNF (unless pt will have 24 hour care after discharge)    Equipment Recommendations  Rolling walker with 5" wheels (possibly-unsure of DME available at home)    Recommendations for Other Services OT consult     Precautions / Restrictions Precautions Precautions: Fall Restrictions Weight Bearing Restrictions: No      Mobility  Bed Mobility Overal bed mobility: Needs Assistance Bed Mobility: Supine to Sit     Supine to sit: Min assist     General bed mobility comments: assist to get to EOB. Increased time.   Transfers Overall transfer level: Needs assistance Equipment used: Rolling walker (2 wheeled) Transfers: Sit to/from [facility] Sit to Stand: Min assist Stand pivot transfers: Min assist       General transfer comment: Assist to rise, stabilize, control descent. VCs safety, technique, han dplacement. Stand pivot from bed to bsc with RW  Ambulation/Gait Ambulation/Gait assistance: Min assist Ambulation Distance (Feet): 5 Feet Assistive device: Rolling walker (2 wheeled) Gait Pattern/deviations: Step-to pattern;Trunk flexed     General Gait Details: Assist to stabilize pt and maneuver with walker.   Stairs            Wheelchair Mobility    Modified Rankin (Stroke Patients Only)       Balance Overall balance assessment: Needs assistance         Standing balance  support: Bilateral upper extremity supported;During functional activity Standing balance-Leahy Scale: Poor                               Pertinent Vitals/Pain Pain Assessment: No/denies pain    Home Living Family/patient expects to be discharged to:: Private residence Living Arrangements: Alone   Type of Home: House Home Access: Stairs to enter   [employer] of Steps: "a few" Home Layout: One level Home Equipment: Cane - single point Additional Comments: unsure of home info. no family present to confirm during session    Prior Function Level of Independence: Independent with assistive device(s)         Comments: per chart, uses cane.      Hand Dominance        Extremity/Trunk Assessment               Lower Extremity Assessment: Generalized weakness      Cervical / Trunk Assessment: Kyphotic  Communication   Communication: No difficulties  Cognition Arousal/Alertness: Awake/alert Behavior During Therapy: WFL for tasks assessed/performed Overall Cognitive Status: History of cognitive impairments - at baseline                      General Comments      Exercises        Assessment/Plan    PT Assessment Patient needs continued PT services  PT Diagnosis Difficulty walking;Generalized weakness;Altered mental status   PT Problem List Decreased strength;Decreased activity tolerance;Decreased balance;Decreased mobility;Decreased knowledge  of use of DME  PT Treatment Interventions DME instruction;Gait training;Functional mobility training;Therapeutic activities;Patient/family education;Balance training;Therapeutic exercise   PT Goals (Current goals can be found in the Care Plan section) Acute Rehab PT Goals Patient Stated Goal: none stated PT Goal Formulation: Patient unable to participate in goal setting Time For Goal Achievement: 05/08/15 Potential to Achieve Goals: Good    Frequency Min 3X/week   Barriers to  discharge        Co-evaluation               End of Session   Activity Tolerance: Patient tolerated treatment well Patient left: in chair;with call bell/phone within reach;with chair alarm set           Time: 7127-8718 PT Time Calculation (min) (ACUTE ONLY): 11 min   Charges:   PT Evaluation $PT Eval Low Complexity: 1 Procedure     PT G Codes:        Weston Anna, MPT Pager: [phone]

## 2015-04-24 NOTE — Progress Notes (Signed)
PROGRESS NOTE  Allison Dunn XMI:[id number] DOB: Jul 02, 1931 DOA: 04/22/2015 PCP: Binnie Rail, MD  HPI:  82 year old female with a history of cognitive impairment, sick sinus syndrome status post permanent pacemaker, hypertension, atrial fibrillation on apixiban, diabetes mellitus presented with 3 day history of increasing confusion, poor oral intake, and shortness of breath. The patient's sister is at the bedside supplementing history. According to the patient's sister, the patient was moved from [place] to [place] in December 2016 secondary to worsening cognitive impairment to the point where the patient was getting lost and forgetting to pay bills. The patient has been in her usual state of health up until 3 days prior to this admission when she began having increasing confusion and generalized weakness which has progressed to the point where she has not been able to get up out of bed for the past 2 days prior to admission. The patient has also had poor oral intake. As result, she was brought to the emergency department for further evaluation. There've been no complaints of headaches, chest pain, respiratory distress, vomiting, diarrhea, abdominal pain, dysuria. The patient normally uses a cane.  In the emergency department, the patient was noted to have sodium 134 with elevated hepatic enzymes. AST 119, ALT 112, alkaline phosphatase 82, total bilirubin 0.7. She had a fever of 102.47F, blood pressure 138/58. Urinalysis showed TNTC WBC. Assessment/Plan: Sepsis  -Secondary to urinary source and possibly pneumonia  -Chest x-ray shows R>L basilar opacity -Continue abx -IV fluid bolus given in the emergency department  -Saline lock IVF fluids  -Blood cultures--neg to date -Follow up urine culture-->Klebsiella -Trend lactic acid 1.09-->0.6 -Trend procalcitonin--NEG Acute encephalopathy -due to infectious process in the setting of underlying dementia -much more alert, but  pleasantly confused -sisters state pt is close to baseline UTI -switch to po cefuroxime CAP -The patient has R>L pulmonary infiltrate -check influenza PCR --NEG -continue azithromycin  -d/c ceftriaxone; start cefuroxime Transaminasemia -Likely due to sepsis vs meds -Right upper quadrant ultrasound--cholelithiasis without cholecystitis; hepatic steatosis -d/c lipitor;  May need to consider stopping amiodarone at some point if no improvement -Check hepatitis B surface antigen, hepatitis C antibody -trend Paroxysmal atrial fibrillation -Continue apixiban -Continue amiodarone Symptomatic bradycardia -Status post permanent pacemaker Diabetes mellitus type 2 -Hemoglobin A1c--7.5 -NovoLog sliding scale -start Lantus 5 units at hs Hypertension -Continue amlodipine/benazepril Cognitive impairment/dementia -Continue Aricept   Family Communication: Sister updated at beside 3/4 Disposition Plan: SNF 04/26/15    Procedures/Studies: Dg Chest 2 View  04/22/2015  CLINICAL DATA:  Fever, weakness, lethargy EXAM: CHEST  2 VIEW COMPARISON:  None. FINDINGS: Cardiomegaly is noted. Dual lead cardiac pacemaker with leads in right atrium and right ventricle. There is bilateral infrahilar hazy atelectasis or infiltrate. No convincing pulmonary edema. Atherosclerotic calcifications of thoracic aorta. IMPRESSION: Cardiomegaly. Dual lead cardiac pacemaker in place. Bilateral infrahilar hazy atelectasis or infiltrate. No convincing pulmonary edema. Electronically Signed   By: Lahoma Crocker M.D.   On: 04/22/2015 15:34   Ct Head Wo Contrast  04/14/2015  CLINICAL DATA:  Loss of peripheral vision on the right, weakness on the right side, slow gait over the last week EXAM: CT HEAD WITHOUT CONTRAST TECHNIQUE: Contiguous axial images were obtained from the base of the skull through the vertex without intravenous contrast. COMPARISON:  CT brain scan of 03/04/2015 FINDINGS: The ventricular system is unchanged in size  and configuration, and mild cortical atrophy again is noted. Mild to moderate small vessel  ischemic change throughout the periventricular white matter again is noted. No hemorrhage, mass lesion, or acute infarction is seen. On bone window images, the paranasal sinuses are well pneumatized. No calvarial abnormality is seen. IMPRESSION: 1. Cortical atrophy and mild to moderate small vessel ischemic change throughout the periventricular white matter. 2. No acute intracranial abnormality. Electronically Signed   By: Ivar Drape M.D.   On: 04/14/2015 15:04   US Abdomen Limited Ruq  04/22/2015  CLINICAL DATA:  82 year old female with elevated liver function test. EXAM: US ABDOMEN LIMITED - RIGHT UPPER QUADRANT COMPARISON:  CT dated 03/10/2015 FINDINGS: Gallbladder: There are multiple stones within the gallbladder. There is stone at the gallbladder neck. There is no gallbladder wall thickening or pericholecystic fluid. Negative sonographic Murphy's sign. Common bile duct: Diameter: 7 mm Liver: There is diffuse increased hepatic echogenicity compatible with fatty infiltration. Small scattered calcifications likely represent old granuloma. IMPRESSION: Cholelithiasis without sonographic evidence of acute cholecystitis. Fatty liver. Electronically Signed   By: Anner Crete M.D.   On: 04/22/2015 23:05         Subjective: Patient denies fevers, chills, headache, chest pain, dyspnea, nausea, vomiting, diarrhea, abdominal pain, dysuria, hematuria   Objective: Filed Vitals:   04/23/15 1531 04/23/15 2231 04/24/15 0522 04/24/15 1459  BP: 127/57 146/61 151/48 139/49  Pulse: 59 77 63 60  Temp: 97.4 F (36.3 C) 97.9 F (36.6 C) 98 F (36.7 C) 97.8 F (36.6 C)  TempSrc: Oral Oral Oral Oral  Resp: '18 18 17 18  '$ Height:      Weight:      SpO2: 99% 97% 99% 99%    Intake/Output Summary (Last 24 hours) at 04/24/15 1711 Last data filed at 04/24/15 1431  Gross per 24 hour  Intake   1190 ml  Output    800 ml   Net    390 ml   Weight change:  Exam:   General:  Pt is alert, follows commands appropriately, not in acute distress  HEENT: No icterus, No thrush, No neck mass, [place]/AT  Cardiovascular: RRR, S1/S2, no rubs, no gallops  Respiratory: Bibasilar crackles, right greater than left. No wheezing. Good air movement  Abdomen: Soft/+BS, non tender, non distended, no guarding; no hepatosplenomegaly  Extremities: No edema, No lymphangitis, No petechiae, No rashes, no synovitis  Data Reviewed: Basic Metabolic Panel:  Recent Labs Lab 04/22/15 1337 04/22/15 2245 04/24/15 0602  NA 134* 136 133*  K 4.0 3.7 3.9  CL 100* 104 103  CO2 '26 24 23  '$ GLUCOSE 100* 137* 244*  BUN '19 16 18  '$ CREATININE 0.95 0.90 0.77  CALCIUM 9.0 8.3* 8.1*   Liver Function Tests:  Recent Labs Lab 04/22/15 1337 04/22/15 2245 04/24/15 0602  AST 119* 109* 111*  ALT 112* 111* 132*  ALKPHOS 82 73 81  BILITOT 0.7 0.4 0.5  PROT 7.4 6.5 6.1*  ALBUMIN 3.1* 2.6* 2.3*   No results for input(s): LIPASE, AMYLASE in the last 168 hours. No results for input(s): AMMONIA in the last 168 hours. CBC:  Recent Labs Lab 04/22/15 1337 04/22/15 2245 04/24/15 0602  WBC 8.2 7.0 7.3  NEUTROABS 5.3 4.6  --   HGB 12.1 11.4* 11.7*  HCT 36.5 34.2* 35.0*  MCV 91.3 91.7 90.9  PLT 186 156 206   Cardiac Enzymes: No results for input(s): CKTOTAL, CKMB, CKMBINDEX, TROPONINI in the last 168 hours. BNP: Invalid input(s): POCBNP CBG:  Recent Labs Lab 04/23/15 1138 04/23/15 1643 04/23/15 2232 04/24/15 0733 04/24/15 1140  GLUCAP  149* 262* 241* 213* 210*    Recent Results (from the past 240 hour(s))  Urine culture     Status: None   Collection Time: 04/22/15  3:14 PM  Result Value Ref Range Status   Specimen Description URINE, CATHETERIZED  Final   Special Requests NONE  Final   Culture   Final    >=100,000 COLONIES/mL KLEBSIELLA PNEUMONIAE Performed at [facility]    Report Status 04/24/2015 FINAL  Final     Organism ID, Bacteria KLEBSIELLA PNEUMONIAE  Final      Susceptibility   Klebsiella pneumoniae - MIC*    AMPICILLIN >=32 RESISTANT Resistant     CEFAZOLIN <=4 SENSITIVE Sensitive     CEFTRIAXONE <=1 SENSITIVE Sensitive     CIPROFLOXACIN <=0.25 SENSITIVE Sensitive     GENTAMICIN <=1 SENSITIVE Sensitive     IMIPENEM <=0.25 SENSITIVE Sensitive     NITROFURANTOIN <=16 SENSITIVE Sensitive     TRIMETH/SULFA <=20 SENSITIVE Sensitive     AMPICILLIN/SULBACTAM 4 SENSITIVE Sensitive     PIP/TAZO 8 SENSITIVE Sensitive     * >=100,000 COLONIES/mL KLEBSIELLA PNEUMONIAE  Culture, blood (routine x 2)     Status: None (Preliminary result)   Collection Time: 04/22/15  4:09 PM  Result Value Ref Range Status   Specimen Description BLOOD LEFT HAND  Final   Special Requests BOTTLES DRAWN AEROBIC ONLY 5CC  Final   Culture   Final    NO GROWTH 2 DAYS Performed at [facility]    Report Status PENDING  Incomplete  Culture, blood (routine x 2)     Status: None (Preliminary result)   Collection Time: 04/22/15  4:09 PM  Result Value Ref Range Status   Specimen Description BLOOD LEFT FOREARM  Final   Special Requests IN PEDIATRIC BOTTLE [place]  Final   Culture   Final    NO GROWTH 2 DAYS Performed at [facility]    Report Status PENDING  Incomplete  Culture, blood (x 2)     Status: None (Preliminary result)   Collection Time: 04/22/15 10:10 PM  Result Value Ref Range Status   Specimen Description BLOOD RIGHT ARM  Final   Special Requests IN PEDIATRIC BOTTLE 4 CC  Final   Culture   Final    NO GROWTH 1 DAY Performed at [facility]    Report Status PENDING  Incomplete  Culture, blood (x 2)     Status: None (Preliminary result)   Collection Time: 04/22/15 10:45 PM  Result Value Ref Range Status   Specimen Description BLOOD LEFT HAND  Final   Special Requests IN PEDIATRIC BOTTLE 3CC  Final   Culture   Final    NO GROWTH 1 DAY Performed at [facility]    Report  Status PENDING  Incomplete     Scheduled Meds: . allopurinol  300 mg Oral Daily  . amiodarone  200 mg Oral Daily  . amLODipine  5 mg Oral Daily   And  . benazepril  10 mg Oral Daily  . apixaban  5 mg Oral BID  . azithromycin  500 mg Intravenous Q24H  . cefUROXime  500 mg Oral BID WC  . cholecalciferol  1,000 Units Oral Daily  . cyanocobalamin  1,500 mcg Oral Daily  . donepezil  5 mg Oral QHS  . feeding supplement (ENSURE ENLIVE)  237 mL Oral BID BM  . insulin aspart  0-9 Units Subcutaneous TID WC  . sodium chloride flush  3  mL Intravenous Q12H   Continuous Infusions:    Martyn Timme, DO  [facility] Pager [phone]  If 7PM-7AM, please contact night-coverage [email or web address] Password [email or web address] 04/24/2015, 5:11 PM   LOS: 2 days

## 2015-04-25 LAB — GLUCOSE, CAPILLARY
GLUCOSE-CAPILLARY: 156 mg/dL — AB (ref 65–99)
GLUCOSE-CAPILLARY: 149 mg/dL — AB (ref 65–99)
Glucose-Capillary: 194 mg/dL — ABNORMAL HIGH (ref 65–99)
Glucose-Capillary: 180 mg/dL — ABNORMAL HIGH (ref 65–99)

## 2015-04-25 LAB — COMPREHENSIVE METABOLIC PANEL
ALK PHOS: 79 U/L (ref 38–126)
ALT: 166 U/L — AB (ref 14–54)
AST: 126 U/L — AB (ref 15–41)
Albumin: 2.3 g/dL — ABNORMAL LOW (ref 3.5–5.0)
Anion gap: 5 (ref 5–15)
BUN: 15 mg/dL (ref 6–20)
CALCIUM: 8.6 mg/dL — AB (ref 8.9–10.3)
CO2: 27 mmol/L (ref 22–32)
CREATININE: 0.78 mg/dL (ref 0.44–1.00)
Chloride: 107 mmol/L (ref 101–111)
Glucose, Bld: 175 mg/dL — ABNORMAL HIGH (ref 65–99)
Potassium: 3.6 mmol/L (ref 3.5–5.1)
Sodium: 139 mmol/L (ref 135–145)
Total Bilirubin: 0.5 mg/dL (ref 0.3–1.2)
Total Protein: 6 g/dL — ABNORMAL LOW (ref 6.5–8.1)

## 2015-04-25 LAB — STREP PNEUMONIAE URINARY ANTIGEN: STREP PNEUMO URINARY ANTIGEN: NEGATIVE

## 2015-04-25 MED ORDER — AZITHROMYCIN 500 MG PO TABS
500.0000 mg | ORAL_TABLET | Freq: Every day | ORAL | Status: DC
  Administered 2015-04-25: 500 mg via ORAL
  Filled 2015-04-25 (×2): qty 1

## 2015-04-25 NOTE — NC FL2 (Signed)
[place] LEVEL OF CARE SCREENING TOOL     IDENTIFICATION  Patient Name: Allison Dunn Birthdate: 05/28/1931 Sex: female Admission Date (Current Location): 04/22/2015  [facility] and [place] Number:  Herbalist and Address:  [facility],  [place], [place]      Provider Number: [id number]  Attending Physician Name and Address:  Orson Eva, MD  Relative Name and Phone Number:  Wilfred Lacy [phone]    Current Level of Care: Hospital Recommended Level of Care: [place] Prior Approval Number:    Date Approved/Denied:   PASRR Number: [id number]  Discharge Plan: SNF    Current Diagnoses: Patient Active Problem List   Diagnosis Date Noted  . Klebsiella pneumoniae sepsis ([place]) 04/24/2015  . Transaminasemia   . Sepsis ([place]) 04/22/2015  . UTI (lower urinary tract infection) 04/22/2015  . Paroxysmal atrial fibrillation ([place]) 04/22/2015  . Delirium   . Urinary tract infectious disease   . Osteoarthritis 03/27/2015  . CVA (cerebral vascular accident) ([place]) 03/23/2015  . Moderate tricuspid regurgitation 03/11/2015  . Cardiomyopathy ([place]), EF 40-45%  02/2015 03/11/2015  . Mild mitral regurgitation 03/11/2015  . Pulmonary arterial hypertension ([place]), mild 03/11/2015  . Mild cognitive impairment 02/24/2015  . Type 2 diabetes mellitus without complication, with long-term current use of insulin ([place]) 02/24/2015  . Essential hypertension 02/24/2015  . Essential hypertension, benign 02/21/2015  . Hyperlipidemia 02/19/2015  . Gout 02/19/2015  . Kidney stone 02/19/2015  . PAD (peripheral artery disease) ([place]) 02/19/2015  . Cardiac pacemaker in situ 02/19/2015  . CKD (chronic kidney disease) 02/19/2015    Orientation RESPIRATION BLADDER Height & Weight     Self, Time, Situation, Place  Normal Incontinent Weight: 132 lb 4.4 oz (60 kg) Height:  5' (152.4 cm)  BEHAVIORAL SYMPTOMS/MOOD NEUROLOGICAL  BOWEL NUTRITION STATUS      Continent Diet (Regular Diet)  AMBULATORY STATUS COMMUNICATION OF NEEDS Skin   Limited Assist Verbally Normal                       Personal Care Assistance Level of Assistance  Bathing, Dressing Bathing Assistance: Limited assistance   Dressing Assistance: Limited assistance     Functional Limitations Info             SPECIAL CARE FACTORS FREQUENCY  PT (By licensed PT)     PT Frequency: 5x a week              Contractures      Additional Factors Info  Insulin Sliding Scale       Insulin Sliding Scale Info: 3x a day       Current Medications (04/25/2015):  This is the current hospital active medication list Current Facility-Administered Medications  Medication Dose Route Frequency Provider Last Rate Last Dose  . acetaminophen (TYLENOL) tablet 650 mg  650 mg Oral Q6H PRN Orson Eva, MD   650 mg at 04/23/15 7591   Or  . acetaminophen (TYLENOL) suppository 650 mg  650 mg Rectal Q6H PRN Orson Eva, MD      . allopurinol (ZYLOPRIM) tablet 300 mg  300 mg Oral Daily Orson Eva, MD   300 mg at 04/25/15 6384  . amiodarone (PACERONE) tablet 200 mg  200 mg Oral Daily Orson Eva, MD   200 mg at 04/25/15 6659  . amLODipine (NORVASC) tablet 5 mg  5 mg Oral Daily Orson Eva, MD   5 mg at 04/25/15 [phone]  And  . benazepril (LOTENSIN) tablet 10 mg  10 mg Oral Daily Orson Eva, MD   10 mg at 04/25/15 0920  . apixaban (ELIQUIS) tablet 5 mg  5 mg Oral BID Orson Eva, MD   5 mg at 04/25/15 0919  . azithromycin (ZITHROMAX) tablet 500 mg  500 mg Oral QHS Minda Ditto, RPH      . camphor-menthol (SARNA) lotion 1 application  1 application Topical PRN Orson Eva, MD      . cefUROXime (CEFTIN) tablet 500 mg  500 mg Oral BID WC Orson Eva, MD   500 mg at 04/25/15 5643  . cholecalciferol (VITAMIN D) tablet 1,000 Units  1,000 Units Oral Daily Orson Eva, MD   1,000 Units at 04/25/15 [phone]  . cyanocobalamin tablet 1,500 mcg  1,500 mcg Oral Daily Orson Eva, MD   1,500  mcg at 04/25/15 1884  . donepezil (ARICEPT) tablet 5 mg  5 mg Oral QHS Orson Eva, MD   5 mg at 04/24/15 2246  . feeding supplement (ENSURE ENLIVE) (ENSURE ENLIVE) liquid 237 mL  237 mL Oral BID BM Orson Eva, MD   237 mL at 04/25/15 1250  . insulin aspart (novoLOG) injection 0-9 Units  0-9 Units Subcutaneous TID WC Orson Eva, MD   2 Units at 04/25/15 1250  . ondansetron (ZOFRAN) tablet 4 mg  4 mg Oral Q6H PRN Orson Eva, MD       Or  . ondansetron [facility]) injection 4 mg  4 mg Intravenous Q6H PRN Orson Eva, MD      . sodium chloride flush (NS) 0.9 % injection 3 mL  3 mL Intravenous Q12H Orson Eva, MD   3 mL at 04/22/15 2252     Discharge Medications: Please see discharge summary for a list of discharge medications.  Relevant Imaging Results:  Relevant Lab Results:   Additional Information SSN [id number]  Ross Ludwig, [place]

## 2015-04-25 NOTE — Clinical Social Work Placement (Signed)
   CLINICAL SOCIAL WORK PLACEMENT  NOTE  Date:  04/25/2015  Patient Details  Name: Allison Dunn MRN: [id number] Date of Birth: Sep 05, 1931  Clinical Social Work is seeking post-discharge placement for this patient at the [place] level of care (*CSW will initial, date and re-position this form in  chart as items are completed):  Yes   Patient/family provided with [facility] Work Department's list of facilities offering this level of care within the geographic area requested by the patient (or if unable, by the patient's family).  Yes   Patient/family informed of their freedom to choose among providers that offer the needed level of care, that participate in Medicare, Medicaid or managed care program needed by the patient, have an available bed and are willing to accept the patient.  Yes   Patient/family informed of [facility]'s ownership interest in [facility] and [facility], as well as of the fact that they are under no obligation to receive care at these facilities.  PASRR submitted to EDS on 04/25/15     PASRR number received on 04/25/15     Existing PASRR number confirmed on       FL2 transmitted to all facilities in geographic area requested by pt/family on 04/25/15     FL2 transmitted to all facilities within larger geographic area on       Patient informed that his/her managed care company has contracts with or will negotiate with certain facilities, including the following:            Patient/family informed of bed offers received.  Patient chooses bed at       Physician recommends and patient chooses bed at      Patient to be transferred to   on  .  Patient to be transferred to facility by       Patient family notified on   of transfer.  Name of family member notified:        PHYSICIAN Please sign FL2     Additional Comment:    _______________________________________________ Ross Ludwig, LCSWA 04/25/2015,  5:20 PM

## 2015-04-25 NOTE — Progress Notes (Signed)
PROGRESS NOTE  Zeda Gangwer DGL:[id number] DOB: Mar 24, 1931 DOA: 04/22/2015 PCP: Binnie Rail, MD  HPI:  82 year old female with a history of cognitive impairment, sick sinus syndrome status post permanent pacemaker, hypertension, atrial fibrillation on apixiban, diabetes mellitus presented with 3 day history of increasing confusion, poor oral intake, and shortness of breath. The patient's sister is at the bedside supplementing history. According to the patient's sister, the patient was moved from [place] to [place] in December 2016 secondary to worsening cognitive impairment to the point where the patient was getting lost and forgetting to pay bills. The patient has been in her usual state of health up until 3 days prior to this admission when she began having increasing confusion and generalized weakness which has progressed to the point where she has not been able to get up out of bed for the past 2 days prior to admission. The patient has also had poor oral intake. As result, she was brought to the emergency department for further evaluation. There've been no complaints of headaches, chest pain, respiratory distress, vomiting, diarrhea, abdominal pain, dysuria. The patient normally uses a cane.  In the emergency department, the patient was noted to have sodium 134 with elevated hepatic enzymes. AST 119, ALT 112, alkaline phosphatase 82, total bilirubin 0.7. She had a fever of 102.49F, blood pressure 138/58. Urinalysis showed TNTC WBC. Assessment/Plan: Sepsis  -Secondary to urinary source and possibly pneumonia  -Chest x-ray shows R>L basilar opacity -Continue abx -IV fluid bolus given in the emergency department  -Saline lock IVF fluids  -Blood cultures--neg to date -Follow up urine culture-->Klebsiella -Trend lactic acid 1.09-->0.6 -Trend procalcitonin--NEG Acute encephalopathy -due to infectious process in the setting of underlying dementia -much more alert, but  pleasantly confused -sisters state pt is close to baseline UTI -switched to po cefuroxime CAP -The patient has R>L pulmonary infiltrate -check influenza PCR --NEG -continue azithromycin  -d/c ceftriaxone; start cefuroxime Transaminasemia -Likely due to sepsis vs meds -Right upper quadrant ultrasound--cholelithiasis without cholecystitis; hepatic steatosis -d/c lipitor; May need to consider stopping amiodarone at some point if no improvement -Check hepatitis B surface antigen, hepatitis C antibody--pending -trend--stable Paroxysmal atrial fibrillation -Continue apixiban -Continue amiodarone Symptomatic bradycardia -Status post permanent pacemaker Diabetes mellitus type 2 -Hemoglobin A1c--7.5 -NovoLog sliding scale -start Lantus 5 units at hs Hypertension -Continue amlodipine/benazepril Cognitive impairment/dementia -Continue Aricept   Family Communication: Sister Onalee Hua updated at beside 3/5 Disposition Plan: SNF 04/26/15     Procedures/Studies: Dg Chest 2 View  04/22/2015  CLINICAL DATA:  Fever, weakness, lethargy EXAM: CHEST  2 VIEW COMPARISON:  None. FINDINGS: Cardiomegaly is noted. Dual lead cardiac pacemaker with leads in right atrium and right ventricle. There is bilateral infrahilar hazy atelectasis or infiltrate. No convincing pulmonary edema. Atherosclerotic calcifications of thoracic aorta. IMPRESSION: Cardiomegaly. Dual lead cardiac pacemaker in place. Bilateral infrahilar hazy atelectasis or infiltrate. No convincing pulmonary edema. Electronically Signed   By: Lahoma Crocker M.D.   On: 04/22/2015 15:34   Ct Head Wo Contrast  04/14/2015  CLINICAL DATA:  Loss of peripheral vision on the right, weakness on the right side, slow gait over the last week EXAM: CT HEAD WITHOUT CONTRAST TECHNIQUE: Contiguous axial images were obtained from the base of the skull through the vertex without intravenous contrast. COMPARISON:  CT brain scan of 03/04/2015 FINDINGS: The ventricular  system is unchanged in size and configuration, and mild cortical atrophy again is noted. Mild to moderate small  vessel ischemic change throughout the periventricular white matter again is noted. No hemorrhage, mass lesion, or acute infarction is seen. On bone window images, the paranasal sinuses are well pneumatized. No calvarial abnormality is seen. IMPRESSION: 1. Cortical atrophy and mild to moderate small vessel ischemic change throughout the periventricular white matter. 2. No acute intracranial abnormality. Electronically Signed   By: Ivar Drape M.D.   On: 04/14/2015 15:04   US Abdomen Limited Ruq  04/22/2015  CLINICAL DATA:  82 year old female with elevated liver function test. EXAM: US ABDOMEN LIMITED - RIGHT UPPER QUADRANT COMPARISON:  CT dated 03/10/2015 FINDINGS: Gallbladder: There are multiple stones within the gallbladder. There is stone at the gallbladder neck. There is no gallbladder wall thickening or pericholecystic fluid. Negative sonographic Murphy's sign. Common bile duct: Diameter: 7 mm Liver: There is diffuse increased hepatic echogenicity compatible with fatty infiltration. Small scattered calcifications likely represent old granuloma. IMPRESSION: Cholelithiasis without sonographic evidence of acute cholecystitis. Fatty liver. Electronically Signed   By: Anner Crete M.D.   On: 04/22/2015 23:05         Subjective: Patient denies fevers, chills, headache, chest pain, dyspnea, nausea, vomiting, diarrhea, abdominal pain, dysuria, hematuria   Objective: Filed Vitals:   04/24/15 1459 04/24/15 2119 04/25/15 0520 04/25/15 1400  BP: 139/49 143/60 129/61 159/62  Pulse: 60 60 60 60  Temp: 97.8 F (36.6 C) 98.1 F (36.7 C) 98.3 F (36.8 C) 98.1 F (36.7 C)  TempSrc: Oral Oral Oral Oral  Resp: '18 18 18 24  '$ Height:      Weight:      SpO2: 99% 99% 97% 99%    Intake/Output Summary (Last 24 hours) at 04/25/15 1907 Last data filed at 04/25/15 1845  Gross per 24 hour    Intake   1310 ml  Output    876 ml  Net    434 ml   Weight change:  Exam:   General:  Pt is alert, follows commands appropriately, not in acute distress  HEENT: No icterus, No thrush, No neck mass, /AT  Cardiovascular: RRR, S1/S2, no rubs, no gallops  Respiratory: Bibasilar rales, right greater than left. No wheezing. Good air movement  Abdomen: Soft/+BS, non tender, non distended, no guarding  Extremities: No edema, No lymphangitis, No petechiae, No rashes, no synovitis  Data Reviewed: Basic Metabolic Panel:  Recent Labs Lab 04/22/15 1337 04/22/15 2245 04/24/15 0602 04/25/15 0605  NA 134* 136 133* 139  K 4.0 3.7 3.9 3.6  CL 100* 104 103 107  CO2 '26 24 23 27  '$ GLUCOSE 100* 137* 244* 175*  BUN '19 16 18 15  '$ CREATININE 0.95 0.90 0.77 0.78  CALCIUM 9.0 8.3* 8.1* 8.6*   Liver Function Tests:  Recent Labs Lab 04/22/15 1337 04/22/15 2245 04/24/15 0602 04/25/15 0605  AST 119* 109* 111* 126*  ALT 112* 111* 132* 166*  ALKPHOS 82 73 81 79  BILITOT 0.7 0.4 0.5 0.5  PROT 7.4 6.5 6.1* 6.0*  ALBUMIN 3.1* 2.6* 2.3* 2.3*   No results for input(s): LIPASE, AMYLASE in the last 168 hours. No results for input(s): AMMONIA in the last 168 hours. CBC:  Recent Labs Lab 04/22/15 1337 04/22/15 2245 04/24/15 0602  WBC 8.2 7.0 7.3  NEUTROABS 5.3 4.6  --   HGB 12.1 11.4* 11.7*  HCT 36.5 34.2* 35.0*  MCV 91.3 91.7 90.9  PLT 186 156 206   Cardiac Enzymes: No results for input(s): CKTOTAL, CKMB, CKMBINDEX, TROPONINI in the last 168 hours. BNP: Invalid input(s):  POCBNP CBG:  Recent Labs Lab 04/24/15 1652 04/24/15 2118 04/25/15 0753 04/25/15 1201 04/25/15 1643  GLUCAP 227* 216* 156* 194* 180*    Recent Results (from the past 240 hour(s))  Urine culture     Status: None   Collection Time: 04/22/15  3:14 PM  Result Value Ref Range Status   Specimen Description URINE, CATHETERIZED  Final   Special Requests NONE  Final   Culture   Final    >=100,000  COLONIES/mL KLEBSIELLA PNEUMONIAE Performed at [facility]    Report Status 04/24/2015 FINAL  Final   Organism ID, Bacteria KLEBSIELLA PNEUMONIAE  Final      Susceptibility   Klebsiella pneumoniae - MIC*    AMPICILLIN >=32 RESISTANT Resistant     CEFAZOLIN <=4 SENSITIVE Sensitive     CEFTRIAXONE <=1 SENSITIVE Sensitive     CIPROFLOXACIN <=0.25 SENSITIVE Sensitive     GENTAMICIN <=1 SENSITIVE Sensitive     IMIPENEM <=0.25 SENSITIVE Sensitive     NITROFURANTOIN <=16 SENSITIVE Sensitive     TRIMETH/SULFA <=20 SENSITIVE Sensitive     AMPICILLIN/SULBACTAM 4 SENSITIVE Sensitive     PIP/TAZO 8 SENSITIVE Sensitive     * >=100,000 COLONIES/mL KLEBSIELLA PNEUMONIAE  Culture, blood (routine x 2)     Status: None (Preliminary result)   Collection Time: 04/22/15  4:09 PM  Result Value Ref Range Status   Specimen Description BLOOD LEFT HAND  Final   Special Requests BOTTLES DRAWN AEROBIC ONLY 5CC  Final   Culture   Final    NO GROWTH 3 DAYS Performed at [facility]    Report Status PENDING  Incomplete  Culture, blood (routine x 2)     Status: None (Preliminary result)   Collection Time: 04/22/15  4:09 PM  Result Value Ref Range Status   Specimen Description BLOOD LEFT FOREARM  Final   Special Requests IN PEDIATRIC BOTTLE [place]  Final   Culture   Final    NO GROWTH 3 DAYS Performed at [facility]    Report Status PENDING  Incomplete  Culture, blood (x 2)     Status: None (Preliminary result)   Collection Time: 04/22/15 10:10 PM  Result Value Ref Range Status   Specimen Description BLOOD RIGHT ARM  Final   Special Requests IN PEDIATRIC BOTTLE 4 CC  Final   Culture   Final    NO GROWTH 2 DAYS Performed at [facility]    Report Status PENDING  Incomplete  Culture, blood (x 2)     Status: None (Preliminary result)   Collection Time: 04/22/15 10:45 PM  Result Value Ref Range Status   Specimen Description BLOOD LEFT HAND  Final   Special Requests IN  PEDIATRIC BOTTLE 3CC  Final   Culture   Final    NO GROWTH 2 DAYS Performed at [facility]    Report Status PENDING  Incomplete     Scheduled Meds: . allopurinol  300 mg Oral Daily  . amiodarone  200 mg Oral Daily  . amLODipine  5 mg Oral Daily   And  . benazepril  10 mg Oral Daily  . apixaban  5 mg Oral BID  . azithromycin  500 mg Oral QHS  . cefUROXime  500 mg Oral BID WC  . cholecalciferol  1,000 Units Oral Daily  . cyanocobalamin  1,500 mcg Oral Daily  . donepezil  5 mg Oral QHS  . feeding supplement (ENSURE ENLIVE)  237 mL Oral BID BM  .  insulin aspart  0-9 Units Subcutaneous TID WC  . sodium chloride flush  3 mL Intravenous Q12H   Continuous Infusions:    Malick Netz, DO  [facility] Pager [phone]  If 7PM-7AM, please contact night-coverage [email or web address] Password [email or web address] 04/25/2015, 7:07 PM   LOS: 3 days

## 2015-04-25 NOTE — Progress Notes (Signed)
PHARMACIST - PHYSICIAN COMMUNICATION DR:   Tat CONCERNING: Antibiotic IV to Oral Route Change Policy  RECOMMENDATION: This patient is receiving Azithromcyin by the intravenous route.  Based on criteria approved by the Pharmacy and Therapeutics Committee, the antibiotic(s) is/are being converted to the equivalent oral dose form(s).   DESCRIPTION: These criteria include:  Patient being treated for a respiratory tract infection, urinary tract infection, cellulitis or clostridium difficile associated diarrhea if on metronidazole  The patient is not neutropenic and does not exhibit a GI malabsorption state  The patient is eating (either orally or via tube) and/or has been taking other orally administered medications for a least 24 hours  The patient is improving clinically and has a Tmax < 100.5  If you have questions about this conversion, please contact the Pharmacy Department  '[]'$   [phone] )  [facility] '[]'$   [phone] )  [facility] '[]'$   [phone] )  [facility] '[]'$   [phone] )  [facility] '[x]'$   [phone] )  [facility]

## 2015-04-25 NOTE — Clinical Social Work Note (Signed)
Clinical Social Work Assessment  Patient Details  Name: Allison Dunn MRN: [id number] Date of Birth: [birth year]  Date of referral:  04/25/15               Reason for consult:  Facility Placement                Permission sought to share information with:  Facility Sport and exercise psychologist, Family Supports Permission granted to share information::  Yes, Verbal Permission Granted  Name::     Wilfred Lacy [phone]  Agency::  SNF admissions  Relationship::     Contact Information:     Housing/Transportation Living arrangements for the past 2 months:  [place] of Information:  Patient Patient Interpreter Needed:  None Criminal Activity/Legal Involvement Pertinent to Current Situation/Hospitalization:  No - Comment as needed Significant Relationships:  Other Family Members Lives with:  Siblings Do you feel safe going back to the place where you live?  No (Patient feels she needs some short term rehab before she can return back home.) Need for family participation in patient care:  Yes (Comment) (Patient requests to have her sister help with decision making.)  Care giving concerns:  Patient and family feel she needs some short term rehab before she can return back home.   Social Worker assessment / plan:  Patient is a 82 year old female who lives with her sister.  Patient is alert and oriented x4 and able to express her opinion.  Patient states she has never been to a SNF for short term rehab before.  CSW explained to patient and her family what to expect and what is involved in the bed search process.  Patient was explained how insurance will pay for her stay and what to expect day of discharge from the hospital.   Patient expressed that she did not have any questions, and if she did think of anything, she will ask to speak with the social worker.  Patient gave CSW permission to fax out and begin bed search process.  Employment status:  Retired Designer, industrial/product PT Recommendations:  [place] / Referral to community resources:  [place]  Patient/Family's Response to care:  Patient and family are in agreement to going to SNF for short term rehab.  Patient/Family's Understanding of and Emotional Response to Diagnosis, Current Treatment, and Prognosis:  Patient and her family are aware of current treatment plan and prognosis.  Patient and family expressed they understand what to expect throughout discharge planning.  Emotional Assessment Appearance:  Appears stated age Attitude/Demeanor/Rapport:    Affect (typically observed):  Appropriate, Calm, Stable, Pleasant Orientation:  Oriented to Self, Oriented to Place, Oriented to  Time, Oriented to Situation Alcohol / Substance use:  Not Applicable Psych involvement (Current and /or in the community):  No (Comment)  Discharge Needs  Concerns to be addressed:  No discharge needs identified Readmission within the last 30 days:  No Current discharge risk:  None Barriers to Discharge:  No Barriers Identified   Ross Ludwig, LCSWA 04/25/2015, 5:12 PM

## 2015-04-26 DIAGNOSIS — Z950 Presence of cardiac pacemaker: Secondary | ICD-10-CM

## 2015-04-26 LAB — COMPREHENSIVE METABOLIC PANEL
ALBUMIN: 2.2 g/dL — AB (ref 3.5–5.0)
ALT: 155 U/L — ABNORMAL HIGH (ref 14–54)
ANION GAP: 6 (ref 5–15)
AST: 102 U/L — ABNORMAL HIGH (ref 15–41)
Alkaline Phosphatase: 76 U/L (ref 38–126)
BILIRUBIN TOTAL: 0.4 mg/dL (ref 0.3–1.2)
BUN: 15 mg/dL (ref 6–20)
CO2: 26 mmol/L (ref 22–32)
Calcium: 8.2 mg/dL — ABNORMAL LOW (ref 8.9–10.3)
Chloride: 105 mmol/L (ref 101–111)
Creatinine, Ser: 0.71 mg/dL (ref 0.44–1.00)
GFR calc Af Amer: >60 mL/min (ref >60)
GFR calc non Af Amer: >60 mL/min (ref >60)
GLUCOSE: 125 mg/dL — AB (ref 65–99)
POTASSIUM: 3.7 mmol/L (ref 3.5–5.1)
SODIUM: 137 mmol/L (ref 135–145)
TOTAL PROTEIN: 6 g/dL — AB (ref 6.5–8.1)

## 2015-04-26 LAB — HEPATITIS C ANTIBODY: HCV Ab: 0.1 s/co ratio (ref 0.0–0.9)

## 2015-04-26 LAB — GLUCOSE, CAPILLARY
GLUCOSE-CAPILLARY: 119 mg/dL — AB (ref 65–99)
GLUCOSE-CAPILLARY: 146 mg/dL — AB (ref 65–99)
GLUCOSE-CAPILLARY: 122 mg/dL — AB (ref 65–99)

## 2015-04-26 LAB — HEPATITIS B SURFACE ANTIGEN: HEP B S AG: NEGATIVE

## 2015-04-26 MED ORDER — CEFUROXIME AXETIL 500 MG PO TABS: 500.0000 mg | ORAL_TABLET | Freq: Two times a day (BID) | ORAL | Status: DC

## 2015-04-26 MED ORDER — AZITHROMYCIN 500 MG PO TABS
500.0000 mg | ORAL_TABLET | Freq: Every day | ORAL | Status: DC
Start: 2015-04-26 — End: 2015-05-05

## 2015-04-26 MED ORDER — DONEPEZIL HCL 5 MG PO TABS: 5.0000 mg | ORAL_TABLET | Freq: Every day | ORAL | Status: DC

## 2015-04-26 MED ORDER — ENSURE ENLIVE PO LIQD: 237.0000 mL | Freq: Two times a day (BID) | ORAL | Status: DC

## 2015-04-26 NOTE — Progress Notes (Signed)
Report called to Vallarie Mare at [facility].  Patient is ready for d/c and awaiting arrival of [facility] for transport.  Will continue to monitor until d/c.

## 2015-04-26 NOTE — Clinical Social Work Placement (Signed)
Patient is set to discharge to [facility] today. Patient & sister, [name] (ph#: [phone]) aware. Discharge packet given to RN, Deidre Ala. [facility] called for transport.     Raynaldo Opitz, [facility] Clinical Social Worker cell #: [phone]    CLINICAL SOCIAL WORK PLACEMENT  NOTE  Date:  04/26/2015  Patient Details  Name: Allison Dunn MRN: [id number] Date of Birth: Apr 12, 1931  Clinical Social Work is seeking post-discharge placement for this patient at the [place] level of care (*CSW will initial, date and re-position this form in  chart as items are completed):  Yes   Patient/family provided with [place] Work Department's list of facilities offering this level of care within the geographic area requested by the patient (or if unable, by the patient's family).  Yes   Patient/family informed of their freedom to choose among providers that offer the needed level of care, that participate in Medicare, Medicaid or managed care program needed by the patient, have an available bed and are willing to accept the patient.  Yes   Patient/family informed of [facility]'s ownership interest in [facility] and [facility], as well as of the fact that they are under no obligation to receive care at these facilities.  PASRR submitted to EDS on 04/25/15     PASRR number received on 04/25/15     Existing PASRR number confirmed on       FL2 transmitted to all facilities in geographic area requested by pt/family on 04/25/15     FL2 transmitted to all facilities within larger geographic area on       Patient informed that his/her managed care company has contracts with or will negotiate with certain facilities, including the following:        Yes   Patient/family informed of bed offers received.  Patient chooses bed at [facility]     Physician recommends and patient chooses bed at      Patient to be  transferred to [facility] on 04/26/15.  Patient to be transferred to facility by [facility]     Patient family notified on 04/26/15 of transfer.  Name of family member notified:  patient's sister, [name] via phone     PHYSICIAN Please sign FL2     Additional Comment:    _______________________________________________ Standley Brooking, LCSW 04/26/2015, 3:57 PM

## 2015-04-26 NOTE — Care Management Important Message (Signed)
Important Message  Patient Details IM Letter given to Kathy/Case Manager to present to Patient Name: Allison Dunn MRN: [id number] Date of Birth: Mar 22, 1931   Medicare Important Message Given:  Yes    Camillo Flaming 04/26/2015, 3:36 [place] Message  Patient Details  Name: Allison Dunn MRN: [id number] Date of Birth: 22-Aug-1931   Medicare Important Message Given:  Yes    Camillo Flaming 04/26/2015, 3:36 PM

## 2015-04-26 NOTE — Care Management Note (Signed)
Case Management Note  Patient Details  Name: Allison Dunn MRN: [id number] Date of Birth: 15-Nov-1931  Subjective/Objective:                    Action/Plan:d/c SNF.   Expected Discharge Date:                  Expected Discharge Plan:  [place]  In-House Referral:     Discharge planning Services  CM Consult  Post Acute Care Choice:    Choice offered to:     DME Arranged:    DME Agency:     HH Arranged:    [facility] Agency:     Status of Service:  Completed, signed off  Medicare Important Message Given:    Date Medicare IM Given:    Medicare IM give by:    Date Additional Medicare IM Given:    Additional Medicare Important Message give by:     If discussed at [place] of Stay Meetings, dates discussed:    Additional Comments:  Dessa Phi, RN 04/26/2015, 1:09 PM

## 2015-04-26 NOTE — Discharge Summary (Signed)
Physician Discharge Summary  Allison Dunn FOY:[id number] DOB: 1931/09/10 DOA: 04/22/2015  PCP: Binnie Rail, MD  Admit date: 04/22/2015 Discharge date: 04/26/2015  Recommendations for Outpatient Follow-up:  1. Pt will need to follow up with PCP in 2 weeks post discharge 2. Please obtain BMP and LFTs in one week Discharge Diagnoses:  Sepsis  -Secondary to urinary source and possibly pneumonia  -Chest x-ray shows R>L basilar opacity -Continue abx -IV fluid bolus given in the emergency department  -Saline lock IVF fluids  -Blood cultures--neg to date -Follow up urine culture-->Klebsiella -Trend lactic acid 1.09-->0.6 -Trend procalcitonin--NEG Acute encephalopathy -due to infectious process in the setting of underlying dementia -much more alert, but pleasantly confused -sisters state pt is close to baseline UTI -switched to po cefuroxime--3 more days after d/c CAP -The patient has R>L pulmonary infiltrate -check influenza PCR --NEG -continue azithromycin --one more day after d/c -d/c ceftriaxone; start cefuroxime Transaminasemia -Likely due to sepsis vs meds -Right upper quadrant ultrasound--cholelithiasis without cholecystitis; hepatic steatosis -d/c lipitor; May need to consider stopping amiodarone at some point if no improvement -Check hepatitis B surface antigen, hepatitis C antibody--pending -trend--stable/improving Paroxysmal atrial fibrillation -Continue apixiban -Continue amiodarone -rate controlled Symptomatic bradycardia -Status post permanent pacemaker Diabetes mellitus type 2 -04/23/15--Hemoglobin A1c--7.5 -NovoLog sliding scale -discharge with Lantus 6 units daily Hypertension -Continue amlodipine/benazepril Cognitive impairment/dementia -Continue Aricept  Discharge Condition: stable  Disposition: SNF  Diet:carb modified Wt Readings from Last 3 Encounters:  04/22/15 60 kg (132 lb 4.4 oz)  03/23/15 59.875 kg (132 lb)  03/23/15 63.322 kg (139 lb  9.6 oz)    History of present illness:  82 year old female with a history of cognitive impairment, sick sinus syndrome status post permanent pacemaker, hypertension, atrial fibrillation on apixiban, diabetes mellitus presented with 3 day history of increasing confusion, poor oral intake, and shortness of breath. The patient's sister is at the bedside supplementing history. According to the patient's sister, the patient was moved from [place] to [place] in December 2016 secondary to worsening cognitive impairment to the point where the patient was getting lost and forgetting to pay bills. The patient has been in her usual state of health up until 3 days prior to this admission when she began having increasing confusion and generalized weakness which has progressed to the point where she has not been able to get up out of bed for the past 2 days prior to admission. The patient has also had poor oral intake. As result, she was brought to the emergency department for further evaluation. There've been no complaints of headaches, chest pain, respiratory distress, vomiting, diarrhea, abdominal pain, dysuria. The patient normally uses a cane.  In the emergency department, the patient was noted to have sodium 134 with elevated hepatic enzymes. AST 119, ALT 112, alkaline phosphatase 82, total bilirubin 0.7. She had a fever of 102.29F, blood pressure 138/58. Urinalysis showed TNTC WBC. The patient was started on intravenous antibiotics and fluids. She had clinical improvement. She was not able physical therapy. They recommended skilled nursing facility. The family agreed.  Discharge Exam: Filed Vitals:   04/26/15 0154 04/26/15 0539  BP:  155/54  Pulse:  65  Temp: 98.3 F (36.8 C) 99.7 F (37.6 C)  Resp:  18   Filed Vitals:   04/25/15 1400 04/25/15 2129 04/26/15 0154 04/26/15 0539  BP: 159/62 152/59  155/54  Pulse: 60 60  65  Temp: 98.1 F (36.7 C) 99.5 F (37.5 C) 98.3 F (36.8 C) 99.7 F  (  37.6 C)  TempSrc: Oral Oral Oral Oral  Resp: '24 20  18  '$ Height:      Weight:      SpO2: 99% 100%  98%   General: Awake and alert, NAD, pleasant, cooperative Cardiovascular: RRR, no rub, no gallop, no S3 Respiratory: Bibasilar crackles, right greater than left. No wheezing Abdomen:soft, nontender, nondistended, positive bowel sounds Extremities: No edema, No lymphangitis, no petechiae  Discharge Instructions      Discharge Instructions    Diet Carb Modified    Complete by:  As directed      Increase activity slowly    Complete by:  As directed             Medication List    STOP taking these medications        amLODipine 5 MG tablet  Commonly known as:  NORVASC     atorvastatin 80 MG tablet  Commonly known as:  LIPITOR      TAKE these medications        allopurinol 300 MG tablet  Commonly known as:  ZYLOPRIM  Take 300 mg by mouth daily.     amiodarone 200 MG tablet  Commonly known as:  PACERONE  Take 200 mg by mouth daily.     amLODipine-benazepril 5-10 MG capsule  Commonly known as:  LOTREL  Take 1 capsule by mouth daily.     apixaban 5 MG Tabs tablet  Commonly known as:  ELIQUIS  Take 1 tablet (5 mg total) by mouth 2 (two) times daily.     azithromycin 500 MG tablet  Commonly known as:  ZITHROMAX  Take 1 tablet (500 mg total) by mouth at bedtime.     B-12 PO  Take 1,500 mcg by mouth daily.     cefUROXime 500 MG tablet  Commonly known as:  CEFTIN  Take 1 tablet (500 mg total) by mouth 2 (two) times daily with a meal.     donepezil 5 MG tablet  Commonly known as:  ARICEPT  Take 1 tablet (5 mg total) by mouth at bedtime.     feeding supplement (ENSURE ENLIVE) Liqd  Take 237 mLs by mouth 2 (two) times daily between meals.     HUMALOG 100 UNIT/ML injection  Generic drug:  insulin lispro  Inject 0-10 Units into the skin 3 (three) times daily before meals. Sliding scale     LEVEMIR 100 UNIT/ML injection  Generic drug:  insulin detemir  Inject  6 Units into the skin at bedtime.     Vitamin D-3 1000 units Caps  Take 1,000 Units by mouth daily.     WOMENS MULTI VITAMIN & MINERAL PO  Take 1 tablet by mouth daily.         The results of significant diagnostics from this hospitalization (including imaging, microbiology, ancillary and laboratory) are listed below for reference.    Significant Diagnostic Studies: Dg Chest 2 View  04/22/2015  CLINICAL DATA:  Fever, weakness, lethargy EXAM: CHEST  2 VIEW COMPARISON:  None. FINDINGS: Cardiomegaly is noted. Dual lead cardiac pacemaker with leads in right atrium and right ventricle. There is bilateral infrahilar hazy atelectasis or infiltrate. No convincing pulmonary edema. Atherosclerotic calcifications of thoracic aorta. IMPRESSION: Cardiomegaly. Dual lead cardiac pacemaker in place. Bilateral infrahilar hazy atelectasis or infiltrate. No convincing pulmonary edema. Electronically Signed   By: Lahoma Crocker M.D.   On: 04/22/2015 15:34   Ct Head Wo Contrast  04/14/2015  CLINICAL DATA:  Loss of peripheral vision  on the right, weakness on the right side, slow gait over the last week EXAM: CT HEAD WITHOUT CONTRAST TECHNIQUE: Contiguous axial images were obtained from the base of the skull through the vertex without intravenous contrast. COMPARISON:  CT brain scan of 03/04/2015 FINDINGS: The ventricular system is unchanged in size and configuration, and mild cortical atrophy again is noted. Mild to moderate small vessel ischemic change throughout the periventricular white matter again is noted. No hemorrhage, mass lesion, or acute infarction is seen. On bone window images, the paranasal sinuses are well pneumatized. No calvarial abnormality is seen. IMPRESSION: 1. Cortical atrophy and mild to moderate small vessel ischemic change throughout the periventricular white matter. 2. No acute intracranial abnormality. Electronically Signed   By: Ivar Drape M.D.   On: 04/14/2015 15:04   US Abdomen Limited  Ruq  04/22/2015  CLINICAL DATA:  82 year old female with elevated liver function test. EXAM: US ABDOMEN LIMITED - RIGHT UPPER QUADRANT COMPARISON:  CT dated 03/10/2015 FINDINGS: Gallbladder: There are multiple stones within the gallbladder. There is stone at the gallbladder neck. There is no gallbladder wall thickening or pericholecystic fluid. Negative sonographic Murphy's sign. Common bile duct: Diameter: 7 mm Liver: There is diffuse increased hepatic echogenicity compatible with fatty infiltration. Small scattered calcifications likely represent old granuloma. IMPRESSION: Cholelithiasis without sonographic evidence of acute cholecystitis. Fatty liver. Electronically Signed   By: Anner Crete M.D.   On: 04/22/2015 23:05     Microbiology: Recent Results (from the past 240 hour(s))  Urine culture     Status: None   Collection Time: 04/22/15  3:14 PM  Result Value Ref Range Status   Specimen Description URINE, CATHETERIZED  Final   Special Requests NONE  Final   Culture   Final    >=100,000 COLONIES/mL KLEBSIELLA PNEUMONIAE Performed at [facility]    Report Status 04/24/2015 FINAL  Final   Organism ID, Bacteria KLEBSIELLA PNEUMONIAE  Final      Susceptibility   Klebsiella pneumoniae - MIC*    AMPICILLIN >=32 RESISTANT Resistant     CEFAZOLIN <=4 SENSITIVE Sensitive     CEFTRIAXONE <=1 SENSITIVE Sensitive     CIPROFLOXACIN <=0.25 SENSITIVE Sensitive     GENTAMICIN <=1 SENSITIVE Sensitive     IMIPENEM <=0.25 SENSITIVE Sensitive     NITROFURANTOIN <=16 SENSITIVE Sensitive     TRIMETH/SULFA <=20 SENSITIVE Sensitive     AMPICILLIN/SULBACTAM 4 SENSITIVE Sensitive     PIP/TAZO 8 SENSITIVE Sensitive     * >=100,000 COLONIES/mL KLEBSIELLA PNEUMONIAE  Culture, blood (routine x 2)     Status: None (Preliminary result)   Collection Time: 04/22/15  4:09 PM  Result Value Ref Range Status   Specimen Description BLOOD LEFT HAND  Final   Special Requests BOTTLES DRAWN AEROBIC ONLY 5CC   Final   Culture   Final    NO GROWTH 3 DAYS Performed at [facility]    Report Status PENDING  Incomplete  Culture, blood (routine x 2)     Status: None (Preliminary result)   Collection Time: 04/22/15  4:09 PM  Result Value Ref Range Status   Specimen Description BLOOD LEFT FOREARM  Final   Special Requests IN PEDIATRIC BOTTLE [place]  Final   Culture   Final    NO GROWTH 3 DAYS Performed at [facility]    Report Status PENDING  Incomplete  Culture, blood (x 2)     Status: None (Preliminary result)   Collection Time: 04/22/15 10:10 PM  Result Value Ref Range Status   Specimen Description BLOOD RIGHT ARM  Final   Special Requests IN PEDIATRIC BOTTLE 4 CC  Final   Culture   Final    NO GROWTH 2 DAYS Performed at [facility]    Report Status PENDING  Incomplete  Culture, blood (x 2)     Status: None (Preliminary result)   Collection Time: 04/22/15 10:45 PM  Result Value Ref Range Status   Specimen Description BLOOD LEFT HAND  Final   Special Requests IN PEDIATRIC BOTTLE 3CC  Final   Culture   Final    NO GROWTH 2 DAYS Performed at [facility]    Report Status PENDING  Incomplete     Labs: Basic Metabolic Panel:  Recent Labs Lab 04/22/15 1337 04/22/15 2245 04/24/15 0602 04/25/15 0605 04/26/15 0530  NA 134* 136 133* 139 137  K 4.0 3.7 3.9 3.6 3.7  CL 100* 104 103 107 105  CO2 '26 24 23 27 26  '$ GLUCOSE 100* 137* 244* 175* 125*  BUN '19 16 18 15 15  '$ CREATININE 0.95 0.90 0.77 0.78 0.71  CALCIUM 9.0 8.3* 8.1* 8.6* 8.2*   Liver Function Tests:  Recent Labs Lab 04/22/15 1337 04/22/15 2245 04/24/15 0602 04/25/15 0605 04/26/15 0530  AST 119* 109* 111* 126* 102*  ALT 112* 111* 132* 166* 155*  ALKPHOS 82 73 81 79 76  BILITOT 0.7 0.4 0.5 0.5 0.4  PROT 7.4 6.5 6.1* 6.0* 6.0*  ALBUMIN 3.1* 2.6* 2.3* 2.3* 2.2*   No results for input(s): LIPASE, AMYLASE in the last 168 hours. No results for input(s): AMMONIA in the last 168  hours. CBC:  Recent Labs Lab 04/22/15 1337 04/22/15 2245 04/24/15 0602  WBC 8.2 7.0 7.3  NEUTROABS 5.3 4.6  --   HGB 12.1 11.4* 11.7*  HCT 36.5 34.2* 35.0*  MCV 91.3 91.7 90.9  PLT 186 156 206   Cardiac Enzymes: No results for input(s): CKTOTAL, CKMB, CKMBINDEX, TROPONINI in the last 168 hours. BNP: Invalid input(s): POCBNP CBG:  Recent Labs Lab 04/25/15 0753 04/25/15 1201 04/25/15 1643 04/25/15 2127 04/26/15 0745  GLUCAP 156* 194* 180* 149* 119*    Time coordinating discharge:  Greater than 30 minutes  Signed:  Que Meneely, DO [facility] Pager: [phone] 04/26/2015, 11:44 AM

## 2015-04-26 NOTE — Progress Notes (Signed)
Physical Therapy Treatment Patient Details Name: Allison Dunn MRN: [id number] DOB: [birth year] Today's Date: 04/26/2015    History of Present Illness 82 yo female admitted with sepsis. Hx of cognitive impairment, SSS, pacemaker HTN, A fib, DM.     PT Comments    Progressing with mobility.   Follow Up Recommendations  SNF     Equipment Recommendations  Rolling walker with 5" wheels (if pt does not have one already. Youth height. )    Recommendations for Other Services       Precautions / Restrictions Precautions Precautions: Fall Restrictions Weight Bearing Restrictions: No    Mobility  Bed Mobility               General bed mobility comments: pt oob in recliner  Transfers Overall transfer level: Needs assistance Equipment used: Rolling walker (2 wheeled) Transfers: Sit to/from Stand Sit to Stand: Min assist         General transfer comment: Assist to rise, stabilize, control descent. VCs safety, technique, hand placement.  Ambulation/Gait Ambulation/Gait assistance: Min assist Ambulation Distance (Feet): 50 Feet Assistive device: Rolling walker (2 wheeled) Gait Pattern/deviations: Step-through pattern;Decreased stride length;Trunk flexed     General Gait Details: Assist to stabilize pt and maneuver with walker intermittently. Pt tended to keep forearms resting on handles of walker. VCs for posture, safe use of RW, distance from RW.    Stairs            Wheelchair Mobility    Modified Rankin (Stroke Patients Only)       Balance                                    Cognition Arousal/Alertness: Awake/alert Behavior During Therapy: WFL for tasks assessed/performed Overall Cognitive Status: History of cognitive impairments - at baseline                      Exercises General Exercises - Lower Extremity Ankle Circles/Pumps: AROM;Both;10 reps;Seated Long Arc Quad: AROM;Both;10 reps;Seated Hip Flexion/Marching:  AROM;Both;10 reps;Seated    General Comments        Pertinent Vitals/Pain Pain Assessment: No/denies pain    Home Living                      Prior Function            PT Goals (current goals can now be found in the care plan section) Progress towards PT goals: Progressing toward goals    Frequency  Min 3X/week    PT Plan Current plan remains appropriate    Co-evaluation             End of Session Equipment Utilized During Treatment: Gait belt Activity Tolerance: Patient tolerated treatment well Patient left: in chair;with call bell/phone within reach;with chair alarm set     Time: 1050-1100 PT Time Calculation (min) (ACUTE ONLY): 10 min  Charges:  $Gait Training: 8-22 mins                    G Codes:      Weston Anna, MPT Pager: [phone]

## 2015-04-27 ENCOUNTER — Telehealth: Payer: Self-pay | Admitting: *Deleted

## 2015-04-27 ENCOUNTER — Encounter: Payer: Self-pay | Admitting: Internal Medicine

## 2015-04-27 ENCOUNTER — Non-Acute Institutional Stay (SKILLED_NURSING_FACILITY): Payer: Medicare Other | Admitting: Internal Medicine

## 2015-04-27 DIAGNOSIS — J189 Pneumonia, unspecified organism: Secondary | ICD-10-CM

## 2015-04-27 DIAGNOSIS — R7401 Elevation of levels of liver transaminase levels: Secondary | ICD-10-CM

## 2015-04-27 DIAGNOSIS — R410 Disorientation, unspecified: Secondary | ICD-10-CM

## 2015-04-27 DIAGNOSIS — R740 Nonspecific elevation of levels of transaminase and lactic acid dehydrogenase [LDH]: Secondary | ICD-10-CM

## 2015-04-27 DIAGNOSIS — A414 Sepsis due to anaerobes: Secondary | ICD-10-CM | POA: Diagnosis not present

## 2015-04-27 DIAGNOSIS — I110 Hypertensive heart disease with heart failure: Secondary | ICD-10-CM | POA: Diagnosis not present

## 2015-04-27 DIAGNOSIS — I480 Paroxysmal atrial fibrillation: Secondary | ICD-10-CM

## 2015-04-27 DIAGNOSIS — E119 Type 2 diabetes mellitus without complications: Secondary | ICD-10-CM | POA: Diagnosis not present

## 2015-04-27 DIAGNOSIS — Z794 Long term (current) use of insulin: Secondary | ICD-10-CM

## 2015-04-27 DIAGNOSIS — Z950 Presence of cardiac pacemaker: Secondary | ICD-10-CM

## 2015-04-27 DIAGNOSIS — N390 Urinary tract infection, site not specified: Secondary | ICD-10-CM

## 2015-04-27 DIAGNOSIS — G3184 Mild cognitive impairment, so stated: Secondary | ICD-10-CM | POA: Diagnosis not present

## 2015-04-27 DIAGNOSIS — A4159 Other Gram-negative sepsis: Secondary | ICD-10-CM

## 2015-04-27 LAB — CULTURE, BLOOD (ROUTINE X 2)
Culture: NO GROWTH
Culture: NO GROWTH

## 2015-04-27 NOTE — Progress Notes (Signed)
MRN: [id number] Name: Allison Dunn  Sex: female Age: 82 y.o. DOB: Nov 11, 1931  [place] [facility]: [facility] Facility/Room:111 Level Of Care: SNF Provider: Inocencio Homes D Emergency Contacts: Extended Emergency Contact Information Primary Emergency Contact: [place] Phone: [phone] Relation: Sister  Code Status:   Allergies: Review of patient's allergies indicates no known allergies.  Chief Complaint  Patient presents with  . New Admit To SNF    HPI: Patient is 82 y.o. female with history of cognitive impairment, sick sinus syndrome status post permanent pacemaker, hypertension, atrial fibrillation on apixiban, diabetes mellitus presented with 3 day history of increasing confusion, poor oral intake, and shortness of breath. The patient's sister is at the bedside supplementing history. According to the patient's sister, the patient was moved from [place] to [place] in December 2016 secondary to worsening cognitive impairment to the point where the patient was getting lost and forgetting to pay bills. The patient has been in her usual state of health up until 3 days prior to this admission when she began having increasing confusion and generalized weakness which has progressed to the point where she has not been able to get up out of bed for the past 2 days prior to admission. The patient has also had poor oral intake. As result, she was brought to the emergency department for further evaluation. Pt was admitted to [facility] from 3/2-6 where she was dx and treated for sepsis from an acute UTI, possibly CAP, complicated by acute encephalopathy and transaminasemia. Pt is admitted to SNF with generalized weakness for OT/PT. While at SNF pt will be followed for PAF, tx with amiodarone and eliquis, HTN, tx with norvasc and benzapril and dementia, tx with aricept.  Past Medical History  Diagnosis Date  . Hypertension   . Hyperlipidemia   . Diabetes mellitus  without complication ([place])   . Cancer [facility])     breast, left; s/p lumpectomy/chemo/radiation    Past Surgical History  Procedure Laterality Date  . Kidney stone removal      x3  . Lumbar spine surgery      laminectomy and rod  . Cataract extraction    . Breast lumpectomy Left       Medication List       This list is accurate as of: 04/27/15 11:59 PM.  Always use your most recent med list.               allopurinol 300 MG tablet  Commonly known as:  ZYLOPRIM  Take 300 mg by mouth daily.     amiodarone 200 MG tablet  Commonly known as:  PACERONE  Take 200 mg by mouth daily.     amLODipine-benazepril 5-10 MG capsule  Commonly known as:  LOTREL  Take 1 capsule by mouth daily.     apixaban 5 MG Tabs tablet  Commonly known as:  ELIQUIS  Take 1 tablet (5 mg total) by mouth 2 (two) times daily.     azithromycin 500 MG tablet  Commonly known as:  ZITHROMAX  Take 1 tablet (500 mg total) by mouth at bedtime.     B-12 PO  Take 1,500 mcg by mouth daily.     cefUROXime 500 MG tablet  Commonly known as:  CEFTIN  Take 1 tablet (500 mg total) by mouth 2 (two) times daily with a meal.     donepezil 5 MG tablet  Commonly known as:  ARICEPT  Take 1 tablet (5 mg total)  by mouth at bedtime.     feeding supplement (ENSURE ENLIVE) Liqd  Take 237 mLs by mouth 2 (two) times daily between meals.     HUMALOG 100 UNIT/ML injection  Generic drug:  insulin lispro  Inject 0-10 Units into the skin 3 (three) times daily before meals. Sliding scale     LEVEMIR 100 UNIT/ML injection  Generic drug:  insulin detemir  Inject 6 Units into the skin at bedtime.     Vitamin D-3 1000 units Caps  Take 1,000 Units by mouth daily.     WOMENS MULTI VITAMIN & MINERAL PO  Take 1 tablet by mouth daily.        No orders of the defined types were placed in this encounter.     There is no immunization history on file for this patient.  Social History  Substance Use Topics  . Smoking  status: Never Smoker   . Smokeless tobacco: Never Used  . Alcohol Use: No    Family history is + DM, HTN    Review of Systems  DATA OBTAINED: from patient, nurse- mostly nurse GENERAL:  no fevers, fatigue, appetite changes SKIN: No itching, rash or wounds EYES: No eye pain, redness, discharge EARS: No earache, tinnitus, change in hearing NOSE: No congestion, drainage or bleeding  MOUTH/THROAT: No mouth or tooth pain, No sore throat RESPIRATORY: No cough, wheezing, SOB CARDIAC: No chest pain, palpitations, lower extremity edema  GI: No abdominal pain, No N/V/D or constipation, No heartburn or reflux  GU: No dysuria, frequency or urgency, or incontinence  MUSCULOSKELETAL: No unrelieved bone/joint pain NEUROLOGIC: No headache, dizziness or focal weakness PSYCHIATRIC: No c/o anxiety or sadness   Filed Vitals:   04/27/15 1313  BP: 129/74  Pulse: 72  Temp: 97.2 F (36.2 C)  Resp: 19    SpO2 Readings from Last 1 Encounters:  04/26/15 98%        Physical Exam  GENERAL APPEARANCE: Alert, conversant,  No acute distress.  SKIN: No diaphoresis rash HEAD: Normocephalic, atraumatic  EYES: Conjunctiva/lids clear. Pupils round, reactive. EOMs intact.  EARS: External exam WNL, canals clear. Hearing grossly normal.  NOSE: No deformity or discharge.  MOUTH/THROAT: Lips w/o lesions  RESPIRATORY: Breathing is even, unlabored. Lung sounds are clear   CARDIOVASCULAR: Heart RRR no murmurs, rubs or gallops. No peripheral edema.   GASTROINTESTINAL: Abdomen is soft, non-tender, not distended w/ normal bowel sounds. GENITOURINARY: Bladder non tender, not distended  MUSCULOSKELETAL: No abnormal joints or musculature NEUROLOGIC:  Cranial nerves 2-12 grossly intact. Moves all extremities  PSYCHIATRIC: Mood and affect appropriate to situation with dementia, no behavioral issues  Patient Active Problem List   Diagnosis Date Noted  . Klebsiella sepsis ([place]) 05/02/2015  . CAP (community  acquired pneumonia) 05/02/2015  . Klebsiella pneumoniae sepsis ([place]) 04/24/2015  . Transaminasemia   . Sepsis ([place]) 04/22/2015  . UTI (lower urinary tract infection) 04/22/2015  . Paroxysmal atrial fibrillation ([place]) 04/22/2015  . Delirium   . Urinary tract infectious disease   . Osteoarthritis 03/27/2015  . CVA (cerebral vascular accident) ([place]) 03/23/2015  . Moderate tricuspid regurgitation 03/11/2015  . Cardiomyopathy ([place]), EF 40-45%  02/2015 03/11/2015  . Mild mitral regurgitation 03/11/2015  . Pulmonary arterial hypertension ([place]), mild 03/11/2015  . Mild cognitive impairment 02/24/2015  . Type 2 diabetes mellitus without complication, with long-term current use of insulin ([place]) 02/24/2015  . Hypertensive heart disease with CHF (congestive heart failure) ([place]) 02/24/2015  . Essential hypertension, benign 02/21/2015  . Hyperlipidemia  02/19/2015  . Gout 02/19/2015  . Kidney stone 02/19/2015  . PAD (peripheral artery disease) ([place]) 02/19/2015  . Cardiac pacemaker in situ 02/19/2015  . CKD (chronic kidney disease) 02/19/2015    CBC    Component Value Date/Time   WBC 7.3 04/24/2015 0602   RBC 3.85* 04/24/2015 0602   HGB 11.7* 04/24/2015 0602   HCT 35.0* 04/24/2015 0602   PLT 206 04/24/2015 0602   MCV 90.9 04/24/2015 0602   LYMPHSABS 1.5 04/22/2015 2245   MONOABS 0.9 04/22/2015 2245   EOSABS 0.0 04/22/2015 2245   BASOSABS 0.0 04/22/2015 2245    CMP     Component Value Date/Time   NA 137 04/26/2015 0530   K 3.7 04/26/2015 0530   CL 105 04/26/2015 0530   CO2 26 04/26/2015 0530   GLUCOSE 125* 04/26/2015 0530   BUN 15 04/26/2015 0530   CREATININE 0.71 04/26/2015 0530   CALCIUM 8.2* 04/26/2015 0530   PROT 6.0* 04/26/2015 0530   ALBUMIN 2.2* 04/26/2015 0530   AST 102* 04/26/2015 0530   ALT 155* 04/26/2015 0530   ALKPHOS 76 04/26/2015 0530   BILITOT 0.4 04/26/2015 0530   GFRNONAA >60 04/26/2015 0530   GFRAA >60 04/26/2015 0530    Lab Results  Component Value  Date   HGBA1C 7.5* 04/23/2015     Dg Chest 2 View  04/22/2015  CLINICAL DATA:  Fever, weakness, lethargy EXAM: CHEST  2 VIEW COMPARISON:  None. FINDINGS: Cardiomegaly is noted. Dual lead cardiac pacemaker with leads in right atrium and right ventricle. There is bilateral infrahilar hazy atelectasis or infiltrate. No convincing pulmonary edema. Atherosclerotic calcifications of thoracic aorta. IMPRESSION: Cardiomegaly. Dual lead cardiac pacemaker in place. Bilateral infrahilar hazy atelectasis or infiltrate. No convincing pulmonary edema. Electronically Signed   By: Lahoma Crocker M.D.   On: 04/22/2015 15:34   US Abdomen Limited Ruq  04/22/2015  CLINICAL DATA:  82 year old female with elevated liver function test. EXAM: US ABDOMEN LIMITED - RIGHT UPPER QUADRANT COMPARISON:  CT dated 03/10/2015 FINDINGS: Gallbladder: There are multiple stones within the gallbladder. There is stone at the gallbladder neck. There is no gallbladder wall thickening or pericholecystic fluid. Negative sonographic Murphy's sign. Common bile duct: Diameter: 7 mm Liver: There is diffuse increased hepatic echogenicity compatible with fatty infiltration. Small scattered calcifications likely represent old granuloma. IMPRESSION: Cholelithiasis without sonographic evidence of acute cholecystitis. Fatty liver. Electronically Signed   By: Anner Crete M.D.   On: 04/22/2015 23:05    Not all labs, radiology exams or other studies done during hospitalization come through on my EPIC note; however they are reviewed by me.    Assessment and Plan  Klebsiella sepsis ([place]) Secondary to urinary source and possibly pneumonia  -Chest x-ray shows R>L basilar opacity -Continue abx -IV fluid bolus given in the emergency department  -Saline lock IVF fluids  -Blood cultures--neg to date -Follow up urine culture-->Klebsiella -Trend lactic acid 1.09-->0.6 -Trend procalcitonin--NEG DNF - cont ceftin for 3 more days  Delirium due to  infectious process in the setting of underlying dementia -much more alert, but pleasantly confused -sisters state pt is close to baseline  UTI (lower urinary tract infection) switched to po cefuroxime--SNF - ceftin for 3 more days  CAP (community acquired pneumonia) The patient has R>L pulmonary infiltrate -check influenza PCR --NEG -continue azithromycin --one more day after d/c -d/c ceftriaxone; start cefuroxime SNF - cont ceftin for 3 more days, zithromax 1 more day  Transaminasemia Likely due to sepsis vs meds -Right  upper quadrant ultrasound--cholelithiasis without cholecystitis; hepatic steatosis -d/c lipitor; May need to consider stopping amiodarone at some point if no improvement -Check hepatitis B surface antigen, hepatitis C antibody--pending -trend--stable/improving SNF - will f/u CMP  Paroxysmal atrial fibrillation (HCC) SNF - Continue apixiban -Continue amiodarone -rate controlled  Hypertensive heart disease with CHF (congestive heart failure) (HCC) SNF -Continue amlodipine/benazepril  Mild cognitive impairment SNF - cont aricept  Type 2 diabetes mellitus without complication, with long-term current use of insulin (HCC) 04/23/15--Hemoglobin A1c--7.5 -NovoLog sliding scale SNF -cont  with Lantus 6 units daily   Time spent > 45 min;> 50% of time with patient was spent reviewing records, labs, tests and studies, counseling and developing plan of care  Hennie Duos, MD

## 2015-04-27 NOTE — Telephone Encounter (Signed)
Tried calling pt to set-up TCM appt spoke with sister she stated pt was actually d/c to SNF. She did not come home...Allison Dunn

## 2015-04-28 LAB — CULTURE, BLOOD (ROUTINE X 2)
CULTURE: NO GROWTH
Culture: NO GROWTH

## 2015-05-02 ENCOUNTER — Encounter: Payer: Self-pay | Admitting: Internal Medicine

## 2015-05-02 DIAGNOSIS — A4159 Other Gram-negative sepsis: Secondary | ICD-10-CM | POA: Insufficient documentation

## 2015-05-02 DIAGNOSIS — J189 Pneumonia, unspecified organism: Secondary | ICD-10-CM

## 2015-05-02 DIAGNOSIS — A414 Sepsis due to anaerobes: Secondary | ICD-10-CM | POA: Insufficient documentation

## 2015-05-02 HISTORY — DX: Pneumonia, unspecified organism: J18.9

## 2015-05-02 NOTE — Assessment & Plan Note (Signed)
switched to po cefuroxime--SNF - ceftin for 3 more days

## 2015-05-02 NOTE — Assessment & Plan Note (Signed)
Likely due to sepsis vs meds -Right upper quadrant ultrasound--cholelithiasis without cholecystitis; hepatic steatosis -d/c lipitor; May need to consider stopping amiodarone at some point if no improvement -Check hepatitis B surface antigen, hepatitis C antibody--pending -trend--stable/improving SNF - will f/u CMP

## 2015-05-02 NOTE — Assessment & Plan Note (Signed)
Secondary to urinary source and possibly pneumonia  -Chest x-ray shows R>L basilar opacity -Continue abx -IV fluid bolus given in the emergency department  -Saline lock IVF fluids  -Blood cultures--neg to date -Follow up urine culture-->Klebsiella -Trend lactic acid 1.09-->0.6 -Trend procalcitonin--NEG DNF - cont ceftin for 3 more days

## 2015-05-02 NOTE — Assessment & Plan Note (Signed)
due to infectious process in the setting of underlying dementia -much more alert, but pleasantly confused -sisters state pt is close to baseline

## 2015-05-02 NOTE — Assessment & Plan Note (Signed)
SNF -Continue amlodipine/benazepril

## 2015-05-02 NOTE — Assessment & Plan Note (Signed)
04/23/15--Hemoglobin A1c--7.5 -NovoLog sliding scale SNF -cont  with Lantus 6 units daily

## 2015-05-02 NOTE — Assessment & Plan Note (Signed)
SNF - cont aricept

## 2015-05-02 NOTE — Assessment & Plan Note (Signed)
SNF - Continue apixiban -Continue amiodarone -rate controlled

## 2015-05-02 NOTE — Assessment & Plan Note (Signed)
The patient has R>L pulmonary infiltrate -check influenza PCR --NEG -continue azithromycin --one more day after d/c -d/c ceftriaxone; start cefuroxime SNF - cont ceftin for 3 more days, zithromax 1 more day

## 2015-05-05 ENCOUNTER — Encounter: Payer: Self-pay | Admitting: Internal Medicine

## 2015-05-05 ENCOUNTER — Non-Acute Institutional Stay (SKILLED_NURSING_FACILITY): Payer: Medicare Other | Admitting: Internal Medicine

## 2015-05-05 DIAGNOSIS — G3184 Mild cognitive impairment, so stated: Secondary | ICD-10-CM

## 2015-05-05 DIAGNOSIS — R7401 Elevation of levels of liver transaminase levels: Secondary | ICD-10-CM

## 2015-05-05 DIAGNOSIS — A4159 Other Gram-negative sepsis: Secondary | ICD-10-CM

## 2015-05-05 DIAGNOSIS — I110 Hypertensive heart disease with heart failure: Secondary | ICD-10-CM

## 2015-05-05 DIAGNOSIS — Z794 Long term (current) use of insulin: Secondary | ICD-10-CM

## 2015-05-05 DIAGNOSIS — R740 Nonspecific elevation of levels of transaminase and lactic acid dehydrogenase [LDH]: Secondary | ICD-10-CM | POA: Diagnosis not present

## 2015-05-05 DIAGNOSIS — E119 Type 2 diabetes mellitus without complications: Secondary | ICD-10-CM | POA: Diagnosis not present

## 2015-05-05 DIAGNOSIS — J189 Pneumonia, unspecified organism: Secondary | ICD-10-CM

## 2015-05-05 DIAGNOSIS — N390 Urinary tract infection, site not specified: Secondary | ICD-10-CM

## 2015-05-05 DIAGNOSIS — A414 Sepsis due to anaerobes: Secondary | ICD-10-CM

## 2015-05-05 DIAGNOSIS — R410 Disorientation, unspecified: Secondary | ICD-10-CM | POA: Diagnosis not present

## 2015-05-05 DIAGNOSIS — I480 Paroxysmal atrial fibrillation: Secondary | ICD-10-CM | POA: Diagnosis not present

## 2015-05-05 NOTE — Progress Notes (Signed)
MRN: [id number] Name: Allison Dunn  Sex: female Age: 82 y.o. DOB: [birth year]  [place] [facility] Facility/Room:111 Level Of Care: SNF Provider: Inocencio Homes D Emergency Contacts: Extended Emergency Contact Information Primary Emergency Contact: [place] Phone: [phone] Relation: Sister  Code Status:   Allergies: Review of patient's allergies indicates no known allergies.  Chief Complaint  Patient presents with  . Discharge Note    HPI: Patient is 82 y.o. female  with history of cognitive impairment, sick sinus syndrome status post permanent pacemaker, hypertension, atrial fibrillation on apixiban, diabetes mellitus was admitted to [facility] from 3/2-6 where she was dx and treated for sepsis from an acute UTI, possibly CAP, complicated by acute encephalopathy and transaminasemia. Pt was admitted to SNF with generalized weakness for OT/PT. She is no longer making gains in therapy and pt's sister wishes to take her home with her.  Past Medical History  Diagnosis Date  . Hypertension   . Hyperlipidemia   . Diabetes mellitus without complication ([place])   . Cancer [facility])     breast, left; s/p lumpectomy/chemo/radiation    Past Surgical History  Procedure Laterality Date  . Kidney stone removal      x3  . Lumbar spine surgery      laminectomy and rod  . Cataract extraction    . Breast lumpectomy Left       Medication List       This list is accurate as of: 05/05/15  4:15 PM.  Always use your most recent med list.               allopurinol 300 MG tablet  Commonly known as:  ZYLOPRIM  Take 300 mg by mouth daily.     amiodarone 200 MG tablet  Commonly known as:  PACERONE  Take 200 mg by mouth daily.     amLODipine-benazepril 5-10 MG capsule  Commonly known as:  LOTREL  Take 1 capsule by mouth daily.     apixaban 5 MG Tabs tablet  Commonly known as:  ELIQUIS  Take 1 tablet (5 mg total) by mouth 2 (two) times daily.     B-12 PO  Take 1,500 mcg by mouth daily.     donepezil 5 MG tablet  Commonly known as:  ARICEPT  Take 1 tablet (5 mg total) by mouth at bedtime.     HUMALOG 100 UNIT/ML injection  Generic drug:  insulin lispro  Inject 0-10 Units into the skin 3 (three) times daily before meals. Sliding scale     LEVEMIR 100 UNIT/ML injection  Generic drug:  insulin detemir  Inject 6 Units into the skin at bedtime.     Vitamin D-3 1000 units Caps  Take 1,000 Units by mouth daily.     WOMENS MULTI VITAMIN & MINERAL PO  Take 1 tablet by mouth daily.        No orders of the defined types were placed in this encounter.     There is no immunization history on file for this patient.  Social History  Substance Use Topics  . Smoking status: Never Smoker   . Smokeless tobacco: Never Used  . Alcohol Use: No    Filed Vitals:   05/05/15 1551  BP: 150/73  Pulse: 61  Temp: 97 F (36.1 C)  Resp: 17    Physical Exam  GENERAL APPEARANCE: Alert, conversant, No acute distress  HEENT: Unremarkable RESPIRATORY: Breathing is even, unlabored. Lung sounds are clear   CARDIOVASCULAR:  Heart RRR no murmurs, rubs or gallops. No peripheral edema  GASTROINTESTINAL: Abdomen is soft, non-tender, not distended w/ normal bowel sounds.  NEUROLOGIC: Cranial nerves 2-12 grossly intact. Moves all extremities   Patient Active Problem List   Diagnosis Date Noted  . Klebsiella sepsis ([place]) 05/02/2015  . CAP (community acquired pneumonia) 05/02/2015  . Klebsiella pneumoniae sepsis ([place]) 04/24/2015  . Transaminasemia   . Sepsis ([place]) 04/22/2015  . UTI (lower urinary tract infection) 04/22/2015  . Paroxysmal atrial fibrillation ([place]) 04/22/2015  . Delirium   . Urinary tract infectious disease   . Osteoarthritis 03/27/2015  . CVA (cerebral vascular accident) ([place]) 03/23/2015  . Moderate tricuspid regurgitation 03/11/2015  . Cardiomyopathy ([place]), EF 40-45%  02/2015 03/11/2015  . Mild mitral regurgitation  03/11/2015  . Pulmonary arterial hypertension ([place]), mild 03/11/2015  . Mild cognitive impairment 02/24/2015  . Type 2 diabetes mellitus without complication, with long-term current use of insulin ([place]) 02/24/2015  . Hypertensive heart disease with CHF (congestive heart failure) ([place]) 02/24/2015  . Essential hypertension, benign 02/21/2015  . Hyperlipidemia 02/19/2015  . Gout 02/19/2015  . Kidney stone 02/19/2015  . PAD (peripheral artery disease) ([place]) 02/19/2015  . Cardiac pacemaker in situ 02/19/2015  . CKD (chronic kidney disease) 02/19/2015    CBC    Component Value Date/Time   WBC 7.3 04/24/2015 0602   RBC 3.85* 04/24/2015 0602   HGB 11.7* 04/24/2015 0602   HCT 35.0* 04/24/2015 0602   PLT 206 04/24/2015 0602   MCV 90.9 04/24/2015 0602   LYMPHSABS 1.5 04/22/2015 2245   MONOABS 0.9 04/22/2015 2245   EOSABS 0.0 04/22/2015 2245   BASOSABS 0.0 04/22/2015 2245    CMP     Component Value Date/Time   NA 137 04/26/2015 0530   K 3.7 04/26/2015 0530   CL 105 04/26/2015 0530   CO2 26 04/26/2015 0530   GLUCOSE 125* 04/26/2015 0530   BUN 15 04/26/2015 0530   CREATININE 0.71 04/26/2015 0530   CALCIUM 8.2* 04/26/2015 0530   PROT 6.0* 04/26/2015 0530   ALBUMIN 2.2* 04/26/2015 0530   AST 102* 04/26/2015 0530   ALT 155* 04/26/2015 0530   ALKPHOS 76 04/26/2015 0530   BILITOT 0.4 04/26/2015 0530   GFRNONAA >60 04/26/2015 0530   GFRAA >60 04/26/2015 0530    Assessment and Plan  Pt is d/c to home with her sister with HH/OT/PT/aid/nursing. Medications have been reconciled and rx's written.   Time spent >30 min;> 50% of time with patient was spent reviewing records, labs, tests and studies, counseling and developing plan of care  Hennie Duos, MD

## 2015-05-06 ENCOUNTER — Other Ambulatory Visit: Payer: Self-pay | Admitting: *Deleted

## 2015-05-06 MED ORDER — AMIODARONE HCL 200 MG PO TABS: 200.0000 mg | ORAL_TABLET | Freq: Every day | ORAL | Status: DC

## 2015-05-06 NOTE — Telephone Encounter (Signed)
Received call from pt daughter mom is needing refills sent on her amiodarone 200 mg. Verified pharmacy inform will send to [facility]...Johny Chess

## 2015-05-10 ENCOUNTER — Telehealth: Payer: Self-pay | Admitting: Internal Medicine

## 2015-05-10 DIAGNOSIS — I429 Cardiomyopathy, unspecified: Secondary | ICD-10-CM

## 2015-05-10 NOTE — Telephone Encounter (Signed)
ok 

## 2015-05-10 NOTE — Telephone Encounter (Signed)
Please advise on HomeHealth orders.

## 2015-05-10 NOTE — Telephone Encounter (Signed)
Would like to see patient for twice a week for 8 weeks for balance training and strengthening and Gate training.  Patient also needs a new walker.  Is ok to leave voice mail.

## 2015-05-11 NOTE — Telephone Encounter (Signed)
LVM with verbal orders for PT. Order has been sent for wheelchair.

## 2015-05-12 ENCOUNTER — Other Ambulatory Visit: Payer: Self-pay | Admitting: Internal Medicine

## 2015-05-12 DIAGNOSIS — R269 Unspecified abnormalities of gait and mobility: Secondary | ICD-10-CM

## 2015-05-12 DIAGNOSIS — M159 Polyosteoarthritis, unspecified: Secondary | ICD-10-CM

## 2015-05-12 DIAGNOSIS — R5381 Other malaise: Secondary | ICD-10-CM

## 2015-05-12 DIAGNOSIS — M150 Primary generalized (osteo)arthritis: Secondary | ICD-10-CM

## 2015-05-12 DIAGNOSIS — G3184 Mild cognitive impairment, so stated: Secondary | ICD-10-CM

## 2015-05-19 ENCOUNTER — Ambulatory Visit (INDEPENDENT_AMBULATORY_CARE_PROVIDER_SITE_OTHER): Payer: Medicare Other | Admitting: Internal Medicine

## 2015-05-19 ENCOUNTER — Other Ambulatory Visit (INDEPENDENT_AMBULATORY_CARE_PROVIDER_SITE_OTHER): Payer: Medicare Other

## 2015-05-19 ENCOUNTER — Encounter: Payer: Self-pay | Admitting: Internal Medicine

## 2015-05-19 VITALS — BP 134/72 | HR 61 | Temp 97.8°F | Resp 16 | Wt 127.0 lb

## 2015-05-19 DIAGNOSIS — E119 Type 2 diabetes mellitus without complications: Secondary | ICD-10-CM

## 2015-05-19 DIAGNOSIS — R740 Nonspecific elevation of levels of transaminase and lactic acid dehydrogenase [LDH]: Secondary | ICD-10-CM | POA: Diagnosis not present

## 2015-05-19 DIAGNOSIS — I10 Essential (primary) hypertension: Secondary | ICD-10-CM | POA: Diagnosis not present

## 2015-05-19 DIAGNOSIS — R7401 Elevation of levels of liver transaminase levels: Secondary | ICD-10-CM

## 2015-05-19 DIAGNOSIS — G3184 Mild cognitive impairment, so stated: Secondary | ICD-10-CM | POA: Diagnosis not present

## 2015-05-19 DIAGNOSIS — Z794 Long term (current) use of insulin: Secondary | ICD-10-CM

## 2015-05-19 LAB — COMPREHENSIVE METABOLIC PANEL
ALK PHOS: 120 U/L — AB (ref 39–117)
ALT: 30 U/L (ref 0–35)
AST: 36 U/L (ref 0–37)
Albumin: 3.4 g/dL — ABNORMAL LOW (ref 3.5–5.2)
BILIRUBIN TOTAL: 0.5 mg/dL (ref 0.2–1.2)
BUN: 12 mg/dL (ref 6–23)
CALCIUM: 9.8 mg/dL (ref 8.4–10.5)
CO2: 34 meq/L — AB (ref 19–32)
CREATININE: 0.93 mg/dL (ref 0.40–1.20)
Chloride: 99 mEq/L (ref 96–112)
GFR: 74.24 mL/min (ref >60.00)
GLUCOSE: 206 mg/dL — AB (ref 70–99)
Potassium: 4.1 mEq/L (ref 3.5–5.1)
Sodium: 138 mEq/L (ref 135–145)
TOTAL PROTEIN: 7.3 g/dL (ref 6.0–8.3)

## 2015-05-19 NOTE — Assessment & Plan Note (Signed)
Sugars have been higher since humalog was adjusted -- her sister was controlling her sugars prior to the hospital and we will go back to her adjusting the insulin appropriately Will adjust further if needed Last a1c 7.5%

## 2015-05-19 NOTE — Assessment & Plan Note (Signed)
Has seen neuro and has follow up - will try to move up appt No significant depression - her family will monitor - if she has depression we should treat since this may increase her memory difficulties No need for psych at this time Has been started on aricept - tolerating it well

## 2015-05-19 NOTE — Patient Instructions (Addendum)
  Test(s) ordered today. Your results will be released to [place] (or called to you) after review, usually within 72hours after test completion. If any changes need to be made, you will be notified at that same time.   Medications reviewed and updated.  Changes include changing the insulin coverage back to what your were doing prior to her hospitalization.   Please followup in 2-3 months  Call with any questions or concerns.

## 2015-05-19 NOTE — Progress Notes (Signed)
Subjective:    Patient ID: Allison Dunn, female    DOB: [birth year], 82 y.o.   MRN: [id number]  HPI She is here for follow up from the hospital and rehab.   She was in the hospital 3/2-3/6 for sepsis.  This was likely related to a UTI, possibly pneumonia.  She was taken to the hospital with 3 days of confusion, generalized weakness, poor oral intake and shortness of breath.  She was febrile in the ED, had elevated lfts and her urine showed an infection.  Her BP was stable. She was put in IVF ane antibiotics for a UTI and possible pneumonia.  Her lipitor was held due to the elevated lfts.  She improved and was discharged to a nursing home for rehab. She did OT an PT.  She improved while in rehab and was discharged home with her sister on 3/15 with HH/OT/PT.  She is doing OT/PT.  Her appetite is improving.  Her sleep is good.    Diabetes:  In the hospital her insulin dosing was changed, but her sugars have been elevated at home.  Her sister feels she was better able to control her sugars doing the sliding scale she was doing previously.  Today her sugar was in the 200's.  She is eating a diabetic diet.    She had transaminitis in the hospital from sepsis.  She has not had repeat blood work done since then.  She is taking her lipitor daily.    Confusion:  She has periods of confusion/ memory difficulties.  It comes and goes.  Her bother in law and sister are concerned about what is causing the memory problems.  They wonder is she should see a psychiatrist.  She has seen neurology and has a follow up appointment in the fall.  She feels a little depressed, but typically her mood is good.  She does get down when she can not do the things she wants to do.   Medications and allergies reviewed with patient and updated if appropriate.  Patient Active Problem List   Diagnosis Date Noted  . Klebsiella sepsis ([place]) 05/02/2015  . CAP (community acquired pneumonia) 05/02/2015  . Klebsiella pneumoniae  sepsis ([place]) 04/24/2015  . Transaminasemia   . Sepsis ([place]) 04/22/2015  . UTI (lower urinary tract infection) 04/22/2015  . Paroxysmal atrial fibrillation ([place]) 04/22/2015  . Delirium   . Urinary tract infectious disease   . Osteoarthritis 03/27/2015  . CVA (cerebral vascular accident) ([place]) 03/23/2015  . Moderate tricuspid regurgitation 03/11/2015  . Cardiomyopathy ([place]), EF 40-45%  02/2015 03/11/2015  . Mild mitral regurgitation 03/11/2015  . Pulmonary arterial hypertension ([place]), mild 03/11/2015  . Mild cognitive impairment 02/24/2015  . Type 2 diabetes mellitus without complication, with long-term current use of insulin ([place]) 02/24/2015  . Hypertensive heart disease with CHF (congestive heart failure) ([place]) 02/24/2015  . Essential hypertension, benign 02/21/2015  . Hyperlipidemia 02/19/2015  . Gout 02/19/2015  . Kidney stone 02/19/2015  . PAD (peripheral artery disease) ([place]) 02/19/2015  . Cardiac pacemaker in situ 02/19/2015  . CKD (chronic kidney disease) 02/19/2015    Current Outpatient Prescriptions on File Prior to Visit  Medication Sig Dispense Refill  . allopurinol (ZYLOPRIM) 300 MG tablet Take 300 mg by mouth daily.    Marland Kitchen amiodarone (PACERONE) 200 MG tablet Take 1 tablet (200 mg total) by mouth daily. 30 tablet 3  . amLODipine-benazepril (LOTREL) 5-10 MG capsule Take 1 capsule by mouth daily.    Marland Kitchen apixaban (  ELIQUIS) 5 MG TABS tablet Take 1 tablet (5 mg total) by mouth 2 (two) times daily. 60 tablet 11  . Cholecalciferol (VITAMIN D-3) 1000 units CAPS Take 1,000 Units by mouth daily.    . Cyanocobalamin (B-12 PO) Take 1,500 mcg by mouth daily.    Marland Kitchen donepezil (ARICEPT) 5 MG tablet Take 1 tablet (5 mg total) by mouth at bedtime. 30 tablet 0  . insulin detemir (LEVEMIR) 100 UNIT/ML injection Inject 6 Units into the skin at bedtime.    . insulin lispro (HUMALOG) 100 UNIT/ML injection Inject 0-10 Units into the skin 3 (three) times daily before meals. Sliding scale    .  Multiple Vitamins-Minerals (WOMENS MULTI VITAMIN & MINERAL PO) Take 1 tablet by mouth daily.      No current facility-administered medications on file prior to visit.    Past Medical History  Diagnosis Date  . Hypertension   . Hyperlipidemia   . Diabetes mellitus without complication ([place])   . Cancer [facility]     breast, left; s/p lumpectomy/chemo/radiation    Past Surgical History  Procedure Laterality Date  . Kidney stone removal      x3  . Lumbar spine surgery      laminectomy and rod  . Cataract extraction    . Breast lumpectomy Left     Social History   Social History  . Marital Status: Widowed    Spouse Name: N/A  . Number of Children: N/A  . Years of Education: N/A   Social History Main Topics  . Smoking status: Never Smoker   . Smokeless tobacco: Never Used  . Alcohol Use: No  . Drug Use: No  . Sexual Activity: Not Asked   Other Topics Concern  . None   Social History Narrative    Family History  Problem Relation Age of Onset  . Cancer Mother     pancreatic  . Diabetes Sister   . Hypertension Sister   . Hypertension Brother     Review of Systems  Constitutional: Positive for appetite change (improving, slightly less). Negative for fever.  HENT: Negative for trouble swallowing.   Respiratory: Negative for cough, shortness of breath and wheezing.   Cardiovascular: Negative for chest pain, palpitations and leg swelling.  Gastrointestinal: Negative for abdominal pain.  Genitourinary: Negative for dysuria and hematuria.  Neurological: Positive for light-headedness. Negative for dizziness and headaches.       Objective:   Filed Vitals:   05/19/15 0923  BP: 134/72  Pulse: 61  Temp: 97.8 F (36.6 C)  Resp: 16   Filed Weights   05/19/15 0923  Weight: 127 lb (57.607 kg)   Body mass index is 24.8 kg/(m^2).   Physical Exam  Constitutional: Appears well-developed and well-nourished. No distress.  Neck: Neck supple. No tracheal deviation  present. No thyromegaly present.  No carotid bruit. No cervical adenopathy.   Cardiovascular: Normal rate, regular rhythm and normal heart sounds.   No murmur heard.  mild edema Pulmonary/Chest: Effort normal and breath sounds normal. No respiratory distress. No wheezes.       Assessment & Plan:   See Problem List for Assessment and Plan of chronic medical problems.  Follow up 2-3 months

## 2015-05-19 NOTE — Progress Notes (Signed)
Pre visit review using our clinic review tool, if applicable. No additional management support is needed unless otherwise documented below in the visit note. 

## 2015-05-19 NOTE — Assessment & Plan Note (Signed)
BP well controlled Current regimen effective and well tolerated Continue current medications at current doses  

## 2015-05-19 NOTE — Assessment & Plan Note (Signed)
Check cmp 

## 2015-05-21 ENCOUNTER — Telehealth: Payer: Self-pay | Admitting: Internal Medicine

## 2015-05-21 NOTE — Telephone Encounter (Signed)
Allison Dunn walked in today. she states that she wen to to her attny for HCPOA, but due to lapsing memory and the patients inability to answer basic questions, she is being asked to apply for guardianship instead.   The attny is asking that a letter be drafted to explain that the patient is no longer of her own capacity and needs guardianship. Also mentioning the current medication regimen and DX plan of care.  Allison states that she can come in and pick it up to take to the attny. The cell # listed is good, but she will be back at her house number by Tuesday

## 2015-05-21 NOTE — Telephone Encounter (Signed)
Please advise 

## 2015-05-23 NOTE — Telephone Encounter (Signed)
Letter written - please print

## 2015-05-24 NOTE — Telephone Encounter (Signed)
LVM informing pts sister letter is ready for pick up at front office.

## 2015-05-25 ENCOUNTER — Telehealth: Payer: Self-pay

## 2015-05-25 DIAGNOSIS — E1122 Type 2 diabetes mellitus with diabetic chronic kidney disease: Secondary | ICD-10-CM | POA: Diagnosis not present

## 2015-05-25 NOTE — Telephone Encounter (Signed)
Home Health Cert/Plan of Care received (05/07/2015 - 07/05/2015) and placed on MD's desk for signature

## 2015-06-02 ENCOUNTER — Other Ambulatory Visit: Payer: Self-pay | Admitting: Urology

## 2015-06-02 DIAGNOSIS — N200 Calculus of kidney: Secondary | ICD-10-CM

## 2015-06-03 ENCOUNTER — Ambulatory Visit (HOSPITAL_COMMUNITY)
Admission: RE | Admit: 2015-06-03 | Discharge: 2015-06-03 | Disposition: A | Discharge status: Home / Self Care | Payer: Medicare Other | Source: Ambulatory Visit | Attending: Urology | Admitting: Urology

## 2015-06-03 ENCOUNTER — Telehealth: Payer: Self-pay | Admitting: Internal Medicine

## 2015-06-03 DIAGNOSIS — R93429 Abnormal radiologic findings on diagnostic imaging of unspecified kidney: Secondary | ICD-10-CM | POA: Diagnosis not present

## 2015-06-03 DIAGNOSIS — N200 Calculus of kidney: Secondary | ICD-10-CM | POA: Diagnosis present

## 2015-06-03 MED ORDER — FUROSEMIDE 10 MG/ML IJ SOLN
INTRAMUSCULAR | Status: AC
  Filled 2015-06-03: qty 4

## 2015-06-03 MED ORDER — FUROSEMIDE 10 MG/ML IJ SOLN
29.0000 mg | Freq: Once | INTRAMUSCULAR | Status: AC
  Administered 2015-06-03: 29 mg via INTRAVENOUS

## 2015-06-03 MED ORDER — ALLOPURINOL 300 MG PO TABS: 300.0000 mg | ORAL_TABLET | Freq: Every day | ORAL | Status: DC

## 2015-06-03 MED ORDER — TECHNETIUM TC 99M MERTIATIDE
15.1000 | Freq: Once | INTRAVENOUS | Status: AC | PRN
  Administered 2015-06-03: 15.1 via INTRAVENOUS

## 2015-06-03 NOTE — Telephone Encounter (Signed)
Requesting refill on allopurinol to be sent to [facility] at [facility]

## 2015-06-03 NOTE — Telephone Encounter (Signed)
Allison Dunn called from [facility] inform Dr. Quay Burow that Allison Dunn did not show up for her appt and it is their policy that pt has to pay the no show fee before make another appt.

## 2015-06-07 ENCOUNTER — Encounter: Payer: Self-pay | Admitting: Neurology

## 2015-06-07 ENCOUNTER — Ambulatory Visit (INDEPENDENT_AMBULATORY_CARE_PROVIDER_SITE_OTHER): Payer: Medicare Other | Admitting: Neurology

## 2015-06-07 VITALS — BP 130/68 | HR 58 | Ht 60.0 in | Wt 124.4 lb

## 2015-06-07 DIAGNOSIS — I10 Essential (primary) hypertension: Secondary | ICD-10-CM | POA: Insufficient documentation

## 2015-06-07 DIAGNOSIS — G3184 Mild cognitive impairment, so stated: Secondary | ICD-10-CM | POA: Diagnosis not present

## 2015-06-07 DIAGNOSIS — E785 Hyperlipidemia, unspecified: Secondary | ICD-10-CM | POA: Diagnosis not present

## 2015-06-07 DIAGNOSIS — E119 Type 2 diabetes mellitus without complications: Secondary | ICD-10-CM | POA: Diagnosis not present

## 2015-06-07 DIAGNOSIS — Z794 Long term (current) use of insulin: Secondary | ICD-10-CM

## 2015-06-07 MED ORDER — DONEPEZIL HCL 10 MG PO TABS: 10.0000 mg | ORAL_TABLET | Freq: Every day | ORAL | Status: DC

## 2015-06-07 NOTE — Progress Notes (Signed)
NEUROLOGY FOLLOW UP OFFICE NOTE  [place] Dost [phone]  HISTORY OF PRESENT ILLNESS: I had the pleasure of seeing Allison Dunn in follow-up in the neurology clinic on 06/07/2015.  She is again accompanied by her sister who helps supplement the history today. The patient was last seen 3 months ago for memory loss. MMSE in January 2017 was 24/30. She was started on low dose Aricept '5mg'$  daily, which she is tolerating without side effects. She presents for an earlier visit due to recent hospitalization early last month for confusion, generalized weakness, poor PO intake, and shortness of breath. She does not recall being in the hospital. She was treated with antibiotics for sepsis likely related to a UTI, possibly pneumonia. She was discharged to a SNF and has been home for the past month. Her sister reports that memory is "just a tad better." The patient feels she is a little better. She can feed and dress independently, but needs help with bathing. Her sister is in charge of medications and bill payments. She does not drive.   When asked about headaches, she initially denied having any headaches, then her sister reminded her that she complains of headaches "all the time." She then reports that headaches are over the frontal regions, no associated nausea/vomiting/photo/phonophobia. She has had the headaches since Spring started, and sniffles frequently. They deny any falls, she walks with her cane today but is noted to have poor balance.   I personally reviewed head CT without contrast done 03/2015 which did not show any acute changes, there was mild diffuse atrophy, mild to moderate chronic microvascular disease.  HPI: This is a pleasant 82 yo RH woman with a history of hypertension, hyperlipidemia, diabetes, pacemaker placement, presenting for evaluation of memory loss. She reports that there are things she can and cannot remember. She forgets conversations but states that she can recall names  and dates. She had been living by herself in [place] until November 2016 when her sister called her Thanksgiving morning and reported that she was not too coherent, answering yes and no to questions. Later that day, her brother-in-law called her to say they were having problems. They brought her back to her apartment and found it to be a mess, she did not know anything, and apparently had not paid the rent or bills for 2 months. She reports that memory changes started in October and she does not recall much of November. Her sister states that she came to pick her up and brought her to live with her in [place] last month. Her sister states she did not seek medical attention at that time, but that they had called her doctor to straighten out her medications. Her sister is now in charge of bills and her medications. She stopped driving after November. Her sister feels she is doing better, she sometimes misplaces things, otherwise no significant concerns. No personality changes. Her sister asks about stress causing the symptoms, her lease was ending in November and she was under stress packing up to move in with her sister. There is no family history of dementia. She denies any history of head injuries, no alcohol use.   PAST MEDICAL HISTORY: Past Medical History  Diagnosis Date  . Hypertension   . Hyperlipidemia   . Diabetes mellitus without complication ([place])   . Cancer [facility])     breast, left; s/p lumpectomy/chemo/radiation    MEDICATIONS: Current Outpatient Prescriptions on File Prior to Visit  Medication Sig Dispense Refill  . allopurinol (  ZYLOPRIM) 300 MG tablet Take 1 tablet (300 mg total) by mouth daily. 90 tablet 1  . amiodarone (PACERONE) 200 MG tablet Take 1 tablet (200 mg total) by mouth daily. 30 tablet 3  . amLODipine-benazepril (LOTREL) 5-10 MG capsule Take 1 capsule by mouth daily.    Marland Kitchen apixaban (ELIQUIS) 5 MG TABS tablet Take 1 tablet (5 mg total) by mouth 2 (two) times daily. 60  tablet 11  . atorvastatin (LIPITOR) 80 MG tablet Take 80 mg by mouth daily.    . Cholecalciferol (VITAMIN D-3) 1000 units CAPS Take 1,000 Units by mouth daily.    . Cyanocobalamin (B-12 PO) Take 1,500 mcg by mouth daily.    Marland Kitchen donepezil (ARICEPT) 5 MG tablet Take 1 tablet (5 mg total) by mouth at bedtime. 30 tablet 0  . insulin detemir (LEVEMIR) 100 UNIT/ML injection Inject 6 Units into the skin at bedtime.    . insulin lispro (HUMALOG) 100 UNIT/ML injection Inject 0-10 Units into the skin 3 (three) times daily before meals. Sliding scale    . Multiple Vitamins-Minerals (WOMENS MULTI VITAMIN & MINERAL PO) Take 1 tablet by mouth daily.      No current facility-administered medications on file prior to visit.    ALLERGIES: No Known Allergies  FAMILY HISTORY: Family History  Problem Relation Age of Onset  . Cancer Mother     pancreatic  . Diabetes Sister   . Hypertension Sister   . Hypertension Brother     SOCIAL HISTORY: Social History   Social History  . Marital Status: Widowed    Spouse Name: N/A  . Number of Children: N/A  . Years of Education: N/A   Occupational History  . Not on file.   Social History Main Topics  . Smoking status: Never Smoker   . Smokeless tobacco: Never Used  . Alcohol Use: No  . Drug Use: No  . Sexual Activity: Not on file   Other Topics Concern  . Not on file   Social History Narrative    REVIEW OF SYSTEMS: Constitutional: No fevers, chills, or sweats, no generalized fatigue, change in appetite Eyes: No visual changes, double vision, eye pain Ear, nose and throat: No hearing loss, ear pain, nasal congestion, sore throat Cardiovascular: No chest pain, palpitations Respiratory:  No shortness of breath at rest or with exertion, wheezes GastrointestinaI: No nausea, vomiting, diarrhea, abdominal pain, fecal incontinence Genitourinary:  No dysuria, urinary retention or frequency Musculoskeletal:  No neck pain, back pain Integumentary: No  rash, pruritus, skin lesions Neurological: as above Psychiatric: No depression, insomnia, anxiety Endocrine: No palpitations, fatigue, diaphoresis, mood swings, change in appetite, change in weight, increased thirst Hematologic/Lymphatic:  No anemia, purpura, petechiae. Allergic/Immunologic: no itchy/runny eyes, nasal congestion, recent allergic reactions, rashes  PHYSICAL EXAM: Filed Vitals:   06/07/15 1329  BP: 130/68  Pulse: 58   General: No acute distress Head:  Normocephalic/atraumatic Neck: supple, no paraspinal tenderness, full range of motion Heart:  Regular rate and rhythm Lungs:  Clear to auscultation bilaterally Back: No paraspinal tenderness Skin/Extremities: No rash, no edema Neurological Exam: alert and oriented to person, place, correct month and day of week, states year is 2024, president is Nixon or [place]. No aphasia or dysarthria. Fund of knowledge is appropriate.  Remote memory intact. 0/3 delayed recall.  Attention and concentration are normal.    Able to name objects and repeat phrases. Cranial nerves: Pupils equal, round, reactive to light. Extraocular movements intact with no nystagmus. Visual fields full. Facial sensation  intact. No facial asymmetry. Tongue, uvula, palate midline.  Motor: Bulk and tone normal, muscle strength 5/5 throughout with no pronator drift.  Sensation to light touch intact.  No extinction to double simultaneous stimulation.  Deep tendon reflexes 2+ throughout, toes downgoing.  Finger to nose testing intact.  Gait slow and cautious, unsteady with cane.   IMPRESSION: This is a pleasant 82 yo RH woman with vascular risk factors including hypertension, hyperlipidemia, diabetes, s/p pacemaker, who presented for memory loss. MMSE in January 2017 was 24/30, indicating mild cognitive impairment, possible mild dementia Alzheimer's type. She continues to have episodes of confusion, fluctuations can be seen in this condition, particularly if more  sleep-deprived, fatigued, underlying infection present. She will increase Aricept to '10mg'$  daily. She is noted to have unsteady gait with cane, and was advised to use walker at all times. I discussed caregiver fatigue with her sister, the patient would benefit from [facility] evaluation, they will discuss this with her PCP. We again discussed the importance of control of vascular risk factors, as well as physical exercise and brain stimulation exercises for brain health. She does not drive. She will follow-up as scheduled in 5 months and knows to call for any problems.   Thank you for allowing me to participate in her care.  Please do not hesitate to call for any questions or concerns.  The duration of this appointment visit was 25 minutes of face-to-face time with the patient.  Greater than 50% of this time was spent in counseling, explanation of diagnosis, planning of further management, and coordination of care.   Ellouise Newer, M.D.   CC: Dr. Quay Burow

## 2015-06-07 NOTE — Patient Instructions (Signed)
1. Increase Aricept: With your current bottle of Aricept '5mg'$ , start taking 2 tablets daily. Once done with this, your new bottle will be for Aricept '10mg'$ : take 1 tablet daily 2. Use your walker at all times. Stop using the cane 3. Discuss getting home health with your primary care doctor 4. Follow-up in 6 months

## 2015-06-18 ENCOUNTER — Ambulatory Visit: Payer: Medicare Other | Admitting: Internal Medicine

## 2015-06-21 ENCOUNTER — Emergency Department (HOSPITAL_COMMUNITY)
Admission: EM | Admit: 2015-06-21 | Discharge: 2015-06-21 | Disposition: A | Discharge status: Home / Self Care | Payer: Medicare Other | Attending: Emergency Medicine | Admitting: Emergency Medicine

## 2015-06-21 ENCOUNTER — Encounter (HOSPITAL_COMMUNITY): Payer: Self-pay

## 2015-06-21 ENCOUNTER — Telehealth: Payer: Self-pay | Admitting: Internal Medicine

## 2015-06-21 DIAGNOSIS — Z79899 Other long term (current) drug therapy: Secondary | ICD-10-CM | POA: Diagnosis not present

## 2015-06-21 DIAGNOSIS — Z853 Personal history of malignant neoplasm of breast: Secondary | ICD-10-CM | POA: Diagnosis not present

## 2015-06-21 DIAGNOSIS — N3001 Acute cystitis with hematuria: Secondary | ICD-10-CM | POA: Insufficient documentation

## 2015-06-21 DIAGNOSIS — Z792 Long term (current) use of antibiotics: Secondary | ICD-10-CM | POA: Diagnosis not present

## 2015-06-21 DIAGNOSIS — E785 Hyperlipidemia, unspecified: Secondary | ICD-10-CM | POA: Diagnosis not present

## 2015-06-21 DIAGNOSIS — R319 Hematuria, unspecified: Secondary | ICD-10-CM | POA: Diagnosis present

## 2015-06-21 DIAGNOSIS — E119 Type 2 diabetes mellitus without complications: Secondary | ICD-10-CM | POA: Diagnosis not present

## 2015-06-21 DIAGNOSIS — I10 Essential (primary) hypertension: Secondary | ICD-10-CM | POA: Diagnosis not present

## 2015-06-21 DIAGNOSIS — Z7901 Long term (current) use of anticoagulants: Secondary | ICD-10-CM | POA: Diagnosis not present

## 2015-06-21 DIAGNOSIS — Z794 Long term (current) use of insulin: Secondary | ICD-10-CM | POA: Insufficient documentation

## 2015-06-21 LAB — CBC WITH DIFFERENTIAL/PLATELET
BASOS PCT: 0 %
Basophils Absolute: 0 10*3/uL (ref 0.0–0.1)
EOS ABS: 0.1 10*3/uL (ref 0.0–0.7)
EOS PCT: 2 %
HCT: 36.1 % (ref 36.0–46.0)
Hemoglobin: 11.8 g/dL — ABNORMAL LOW (ref 12.0–15.0)
LYMPHS ABS: 1.7 10*3/uL (ref 0.7–4.0)
Lymphocytes Relative: 29 %
MCH: 30.9 pg (ref 26.0–34.0)
MCHC: 32.7 g/dL (ref 30.0–36.0)
MCV: 94.5 fL (ref 78.0–100.0)
MONOS PCT: 8 %
Monocytes Absolute: 0.5 10*3/uL (ref 0.1–1.0)
Neutro Abs: 3.7 10*3/uL (ref 1.7–7.7)
Neutrophils Relative %: 61 %
PLATELETS: 221 10*3/uL (ref 150–400)
RBC: 3.82 MIL/uL — AB (ref 3.87–5.11)
RDW: 17.2 % — ABNORMAL HIGH (ref 11.5–15.5)
WBC: 6 10*3/uL (ref 4.0–10.5)

## 2015-06-21 LAB — COMPREHENSIVE METABOLIC PANEL
ALT: 47 U/L (ref 14–54)
ANION GAP: 9 (ref 5–15)
AST: 39 U/L (ref 15–41)
Albumin: 3.3 g/dL — ABNORMAL LOW (ref 3.5–5.0)
Alkaline Phosphatase: 110 U/L (ref 38–126)
BUN: 18 mg/dL (ref 6–20)
CHLORIDE: 103 mmol/L (ref 101–111)
CO2: 27 mmol/L (ref 22–32)
Calcium: 9 mg/dL (ref 8.9–10.3)
Creatinine, Ser: 1.08 mg/dL — ABNORMAL HIGH (ref 0.44–1.00)
GFR calc non Af Amer: 46 mL/min — ABNORMAL LOW (ref >60)
GFR, EST AFRICAN AMERICAN: 54 mL/min — AB (ref >60)
Glucose, Bld: 153 mg/dL — ABNORMAL HIGH (ref 65–99)
Potassium: 3.9 mmol/L (ref 3.5–5.1)
SODIUM: 139 mmol/L (ref 135–145)
Total Bilirubin: 0.4 mg/dL (ref 0.3–1.2)
Total Protein: 7 g/dL (ref 6.5–8.1)

## 2015-06-21 LAB — URINALYSIS, ROUTINE W REFLEX MICROSCOPIC
BILIRUBIN URINE: NEGATIVE
Glucose, UA: NEGATIVE mg/dL
KETONES UR: NEGATIVE mg/dL
Nitrite: NEGATIVE
Protein, ur: 30 mg/dL — AB
SPECIFIC GRAVITY, URINE: 1.015 (ref 1.005–1.030)
pH: 6.5 (ref 5.0–8.0)

## 2015-06-21 LAB — URINE MICROSCOPIC-ADD ON

## 2015-06-21 MED ORDER — NITROFURANTOIN MONOHYD MACRO 100 MG PO CAPS: 100.0000 mg | ORAL_CAPSULE | Freq: Once | ORAL | Status: DC

## 2015-06-21 MED ORDER — CIPROFLOXACIN HCL 500 MG PO TABS
500.0000 mg | ORAL_TABLET | Freq: Once | ORAL | Status: AC
  Administered 2015-06-21: 500 mg via ORAL
  Filled 2015-06-21: qty 1

## 2015-06-21 MED ORDER — APIXABAN 5 MG PO TABS: 2.5000 mg | ORAL_TABLET | Freq: Two times a day (BID) | ORAL | Status: DC

## 2015-06-21 MED ORDER — CIPROFLOXACIN HCL 500 MG PO TABS: 500.0000 mg | ORAL_TABLET | Freq: Two times a day (BID) | ORAL | Status: DC

## 2015-06-21 NOTE — ED Notes (Signed)
Pt given sandwich, apple sauce, water

## 2015-06-21 NOTE — ED Provider Notes (Signed)
CSN: [id number]     Arrival date & time 06/21/15  1153 History   First MD Initiated Contact with Patient 06/21/15 1234     Chief Complaint  Patient presents with  . Hematuria  PT SAID THAT SHE WOKE UP THIS AM WITH BLOOD IN HER URINE.  SHE IS ON ELIQUIS.  PT GETS FREQUENT UTIS.  SHE IS NOT HAVING ANY PAIN.   (Consider location/radiation/quality/duration/timing/severity/associated sxs/prior Treatment) Patient is a 82 y.o. female presenting with hematuria. The history is provided by the patient.  Hematuria This is a new problem. The current episode started 3 to 5 hours ago. The problem has not changed since onset.Nothing aggravates the symptoms. Nothing relieves the symptoms.    Past Medical History  Diagnosis Date  . Hypertension   . Hyperlipidemia   . Diabetes mellitus without complication ([place])   . Cancer [facility])     breast, left; s/p lumpectomy/chemo/radiation  . Renal disorder    Past Surgical History  Procedure Laterality Date  . Kidney stone removal      x3  . Lumbar spine surgery      laminectomy and rod  . Cataract extraction    . Breast lumpectomy Left   . Eye surgery     Family History  Problem Relation Age of Onset  . Cancer Mother     pancreatic  . Diabetes Sister   . Hypertension Sister   . Hypertension Brother    Social History  Substance Use Topics  . Smoking status: Never Smoker   . Smokeless tobacco: Never Used  . Alcohol Use: No   OB History    No data available     Review of Systems  Genitourinary: Positive for hematuria.  All other systems reviewed and are negative.     Allergies  Review of patient's allergies indicates no known allergies.  Home Medications   Prior to Admission medications   Medication Sig Start Date End Date Taking? Authorizing Provider  allopurinol (ZYLOPRIM) 300 MG tablet Take 1 tablet (300 mg total) by mouth daily. 06/03/15  Yes Binnie Rail, MD  amiodarone (PACERONE) 200 MG tablet Take 1 tablet (200 mg total) by  mouth daily. 05/06/15  Yes Binnie Rail, MD  amLODipine-benazepril (LOTREL) 5-10 MG capsule Take 1 capsule by mouth daily.   Yes Historical Provider, MD  atorvastatin (LIPITOR) 80 MG tablet Take 80 mg by mouth daily.   Yes Historical Provider, MD  Cholecalciferol (VITAMIN D-3) 1000 units CAPS Take 1,000 Units by mouth daily.   Yes Historical Provider, MD  Cyanocobalamin (B-12 PO) Take 1,500 mcg by mouth daily.   Yes Historical Provider, MD  donepezil (ARICEPT) 10 MG tablet Take 1 tablet (10 mg total) by mouth at bedtime. 06/07/15  Yes Cameron Sprang, MD  insulin detemir (LEVEMIR) 100 UNIT/ML injection Inject 6 Units into the skin at bedtime.   Yes Historical Provider, MD  insulin lispro (HUMALOG) 100 UNIT/ML injection Inject 0-10 Units into the skin 3 (three) times daily before meals. Sliding scale   Yes Historical Provider, MD  Insulin Syringe-Needle U-100 (INSULIN SYRINGE .5CC/31GX5/16") 31G X 5/16" 0.5 ML MISC  04/03/15  Yes Historical Provider, MD  Multiple Vitamins-Minerals (WOMENS MULTI VITAMIN & MINERAL PO) Take 1 tablet by mouth daily.    Yes Historical Provider, MD  trimethoprim (TRIMPEX) 100 MG tablet Take 100 mg by mouth at bedtime. 06/07/15  Yes Historical Provider, MD  apixaban (ELIQUIS) 5 MG TABS tablet Take 0.5 tablets (2.5 mg total) by mouth  2 (two) times daily. 06/21/15   Isla Pence, MD  ciprofloxacin (CIPRO) 500 MG tablet Take 1 tablet (500 mg total) by mouth every 12 (twelve) hours. 06/21/15   Isla Pence, MD   BP 182/70 mmHg  Pulse 59  Resp 16  SpO2 100% Physical Exam  Constitutional: She is oriented to person, place, and time. She appears well-developed and well-nourished.  HENT:  Head: Normocephalic and atraumatic.  Right Ear: External ear normal.  Left Ear: External ear normal.  Mouth/Throat: Oropharynx is clear and moist.  Eyes: Conjunctivae and EOM are normal. Pupils are equal, round, and reactive to light.  Neck: Normal range of motion. Neck supple.   Cardiovascular: Normal rate, regular rhythm, normal heart sounds and intact distal pulses.   Pulmonary/Chest: Effort normal and breath sounds normal.  Abdominal: Soft. Bowel sounds are normal.  Musculoskeletal: Normal range of motion.  Neurological: She is alert and oriented to person, place, and time.  Skin: Skin is warm and dry.  Psychiatric: She has a normal mood and affect. Her behavior is normal.  Nursing note and vitals reviewed.   ED Course  Procedures (including critical care time) Labs Review Labs Reviewed  URINALYSIS, ROUTINE W REFLEX MICROSCOPIC (NOT AT [facility]) - Abnormal; Notable for the following:    Color, Urine AMBER (*)    APPearance TURBID (*)    Hgb urine dipstick LARGE (*)    Protein, ur 30 (*)    Leukocytes, UA LARGE (*)    All other components within normal limits  CBC WITH DIFFERENTIAL/PLATELET - Abnormal; Notable for the following:    RBC 3.82 (*)    Hemoglobin 11.8 (*)    RDW 17.2 (*)    All other components within normal limits  COMPREHENSIVE METABOLIC PANEL - Abnormal; Notable for the following:    Glucose, Bld 153 (*)    Creatinine, Ser 1.08 (*)    Albumin 3.3 (*)    GFR calc non Af Amer 46 (*)    GFR calc Af Amer 54 (*)    All other components within normal limits  URINE MICROSCOPIC-ADD ON - Abnormal; Notable for the following:    Squamous Epithelial / LPF 0-5 (*)    Bacteria, UA MANY (*)    All other components within normal limits    Imaging Review No results found. I have personally reviewed and evaluated these images and lab results as part of my medical decision-making.   EKG Interpretation None      MDM  I RECEIVED A CALL FROM THE [facility] PHARMACIST AND SHE REC DECREASING THE ELIQUIS DOSE TO 2.5 BID DUE TO WEIGHT AND AGE.  THAT WILL BE DONE.  PT HAS AN APPT WITH DR. TANNENBAUM (UROLOGY) NEXT WEEK.  I WILL ORDER A URINE CULTURE FOR PT.  Final diagnoses:  Acute cystitis with hematuria        Isla Pence, MD 06/21/15 1527

## 2015-06-21 NOTE — Telephone Encounter (Signed)
Patient Name: Allison Dunn DOB: [birth year] Initial Comment Caller states sister has a lot of blood in urine Nurse Assessment Nurse: Vallery Sa, RN, Cathy Date/Time (Eastern Time): 06/21/2015 10:14:27 AM Confirm and document reason for call. If symptomatic, describe symptoms. You must click the next button to save text entered. ---Caller states [name] developed blood in her urine this morning. No pain or fever. Has the patient traveled out of the country within the last 30 days? ---No Does the patient have any new or worsening symptoms? ---Yes Will a triage be completed? ---Yes Related visit to physician within the last 2 weeks? ---Yes Does the PT have any chronic conditions? (i.e. diabetes, asthma, etc.) ---Yes List chronic conditions. ---Diabetes, high Blood Pressure and Cholesterol, Kidney problems and kidney stones Is this a behavioral health or substance abuse call? ---No Guidelines Guideline Title Affirmed Question Affirmed Notes Urine - Blood In Passing pure blood or large blood clots (i.e., size > a dime) (Exception: fleck or small strands) Final Disposition User Go to ED Now Vallery Sa, RN, Cathy Referrals [facility] - ED Disagree/Comply: Comply

## 2015-06-21 NOTE — ED Notes (Signed)
MD at bedside. 

## 2015-06-21 NOTE — ED Notes (Signed)
Patient c/o bright red blood in her urine this AM. Patient denies any pain.

## 2015-06-21 NOTE — Discharge Instructions (Signed)
DECREASE YOUR ELIQUIS DOSE TO 2.5 MG BID Urinary Tract Infection A urinary tract infection (UTI) can occur any place along the urinary tract. The tract includes the kidneys, ureters, bladder, and urethra. A type of germ called bacteria often causes a UTI. UTIs are often helped with antibiotic medicine.  HOME CARE   If given, take antibiotics as told by your doctor. Finish them even if you start to feel better.  Drink enough fluids to keep your pee (urine) clear or pale yellow.  Avoid tea, drinks with caffeine, and bubbly (carbonated) drinks.  Pee often. Avoid holding your pee in for a long time.  Pee before and after having sex (intercourse).  Wipe from front to back after you poop (bowel movement) if you are a woman. Use each tissue only once. GET HELP RIGHT AWAY IF:   You have back pain.  You have lower belly (abdominal) pain.  You have chills.  You feel sick to your stomach (nauseous).  You throw up (vomit).  Your burning or discomfort with peeing does not go away.  You have a fever.  Your symptoms are not better in 3 days. MAKE SURE YOU:   Understand these instructions.  Will watch your condition.  Will get help right away if you are not doing well or get worse.   This information is not intended to replace advice given to you by your health care provider. Make sure you discuss any questions you have with your health care provider.   Document Released: 07/26/2007 Document Revised: 02/27/2014 Document Reviewed: 09/07/2011 Elsevier Interactive Patient Education [employer].

## 2015-06-22 ENCOUNTER — Telehealth: Payer: Self-pay | Admitting: Cardiology

## 2015-06-22 ENCOUNTER — Encounter: Payer: Medicare Other | Admitting: *Deleted

## 2015-06-22 NOTE — Telephone Encounter (Signed)
Attempted to confirm remote transmission with pt. No answer and was unable to leave a message.   

## 2015-06-23 ENCOUNTER — Telehealth: Payer: Self-pay | Admitting: Internal Medicine

## 2015-06-23 NOTE — Telephone Encounter (Signed)
Christ, PT assistant from [facility], called want to report a fall that Allison Dunn had past Saturday. Pt went to the ER on Monday and they also found a UTI (antibiotic given). Christ stated pt said she did not hit her heard, but some bruises on left knee.

## 2015-06-24 LAB — URINE CULTURE
Culture: >=100000 — AB
SPECIAL REQUESTS: NORMAL

## 2015-06-25 ENCOUNTER — Telehealth: Payer: Self-pay

## 2015-06-25 ENCOUNTER — Encounter: Payer: Self-pay | Admitting: Cardiology

## 2015-06-25 NOTE — Telephone Encounter (Signed)
Post ED Visit - Positive Culture Follow-up  Culture report reviewed by antimicrobial stewardship pharmacist:  '[]'$  Elenor Quinones, Pharm.D. '[]'$  Heide Guile, Pharm.D., BCPS '[]'$  Parks Neptune, Pharm.D. '[]'$  Alycia Rossetti, Pharm.D., BCPS '[]'$  [place], Pharm.D., BCPS, AAHIVP '[]'$  Legrand Como, Pharm.D., BCPS, AAHIVP '[x]'$  Milus Glazier, Pharm.D. '[]'$  Stephens November, Pharm.D.  Positive urine culture Treated with Trimethoprim and ciprofloxacin, organism sensitive to the same and no further patient follow-up is required at this time.  Genia Del 06/25/2015, 11:04 AM

## 2015-06-29 ENCOUNTER — Telehealth: Payer: Self-pay | Admitting: Internal Medicine

## 2015-06-29 NOTE — Telephone Encounter (Signed)
New message      Request for surgical clearance:  What type of surgery is being performed? Laparoscopic nephrectomy 1. When is this surgery scheduled?  Pending clearance  2. Are there any medications that need to be held prior to surgery and how long? Hold plavix and asa 5 days prior 3. Name of physician performing surgery?  Dr Louis Meckel   4. What is your office phone and fax number? Fax [phone]

## 2015-06-30 NOTE — Telephone Encounter (Signed)
Discussed with Dr Lovena Le May proceed with surgery--low risk Not on Plavix, on Eliquis 5 mg bid--ok to hold for 2 days prior to procedure.

## 2015-07-01 ENCOUNTER — Other Ambulatory Visit: Payer: Self-pay | Admitting: Urology

## 2015-07-14 ENCOUNTER — Telehealth: Payer: Self-pay | Admitting: Internal Medicine

## 2015-07-14 NOTE — Telephone Encounter (Signed)
Discussed with Dr Lovena Le, he said okay to proceed with surgery.  Low risk form a cardiac standpoint.  May hold Plavix for 5 days prior.  Will fax information

## 2015-07-14 NOTE — Telephone Encounter (Signed)
DOES THIS PATIENT NEED TO BE SEEN TO BE CLEARED FOR THIS SURGERY  Request for surgical clearance:  1. What type of surgery is being performed? Left laparoscopic simple nephrectomy   2. When is this surgery scheduled? 6/8   3. Are there any medications that need to be held prior to surgery and how long? Did not say    4. Name of physician performing surgery? Louis Meckel   5. What is your office phone and fax number? (P) [phone] 6.

## 2015-07-20 ENCOUNTER — Telehealth: Payer: Self-pay

## 2015-07-20 NOTE — Telephone Encounter (Signed)
Patient is needing a refill on One touch ultra 50 test strips and the Med point safety lancets 30g, 100 lancets. When she moved here she had enough and now she is out. Could you please send them to [facility] @ [place]. Insurance states she needs to be using a Product/process development scientist.

## 2015-07-21 MED ORDER — ONETOUCH ULTRASOFT LANCETS MISC: Status: DC

## 2015-07-21 MED ORDER — GLUCOSE BLOOD VI STRP: ORAL_STRIP | Status: DC

## 2015-07-21 NOTE — Telephone Encounter (Signed)
Request sent 

## 2015-07-22 ENCOUNTER — Encounter (HOSPITAL_COMMUNITY): Payer: Self-pay

## 2015-07-22 ENCOUNTER — Encounter: Payer: Self-pay | Admitting: Family Medicine

## 2015-07-22 ENCOUNTER — Other Ambulatory Visit: Payer: Self-pay | Admitting: *Deleted

## 2015-07-22 MED ORDER — INSULIN LISPRO 100 UNIT/ML ~~LOC~~ SOLN
0.0000 [IU] | Freq: Three times a day (TID) | SUBCUTANEOUS | Status: DC
Start: 2015-07-22 — End: 2015-08-03

## 2015-07-22 NOTE — Telephone Encounter (Signed)
Received call from pt sister stating she is completely out of her Humalog insulin. Needing refills sent to [facility]. Inform sending electronically...Johny Chess

## 2015-07-22 NOTE — Patient Instructions (Addendum)
Allison Dunn  07/22/2015   Your procedure is scheduled on: 07/29/2015    Report to [facility] Main  Entrance take [place]  elevators to 3rd floor to  [place] at     [place] AM.  Call this number if you have problems the morning of surgery [phone]   Remember: ONLY 1 PERSON MAY GO WITH YOU TO SHORT STAY TO GET  READY MORNING OF [place].  Do not eat food or drink liquids :After Midnight.     Take these medicines the morning of surgery with A SIP OF WATER: Allopurinol, Amiodarone ( Pacerone)              Take 1/2 of evening dose of Insulin nite before surgery or none  DO NOT TAKE ANY DIABETIC MEDICATIONS DAY OF YOUR SURGERY                               You may not have any metal on your body including hair pins and              piercings  Do not wear jewelry, make-up, lotions, powders or perfumes, deodorant             Do not wear nail polish.  Do not shave  48 hours prior to surgery.              Do not bring valuables to the hospital. [place].  Contacts, dentures or bridgework may not be worn into surgery.  Leave suitcase in the car. After surgery it may be brought to your room.        Special Instructions: coughing and deep breathing exercises, leg exercises               Please read over the following fact sheets you were given: _____________________________________________________________________             [facility] - Preparing for Surgery Before surgery, you can play an important role.  Because skin is not sterile, your skin needs to be as free of germs as possible.  You can reduce the number of germs on your skin by washing with CHG (chlorahexidine gluconate) soap before surgery.  CHG is an antiseptic cleaner which kills germs and bonds with the skin to continue killing germs even after washing. Please DO NOT use if you have an allergy to CHG or antibacterial soaps.  If your skin  becomes reddened/irritated stop using the CHG and inform your nurse when you arrive at Short Stay. Do not shave (including legs and underarms) for at least 48 hours prior to the first CHG shower.  You may shave your face/neck. Please follow these instructions carefully:  1.  Shower with CHG Soap the night before surgery and the  morning of Surgery.  2.  If you choose to wash your hair, wash your hair first as usual with your  normal  shampoo.  3.  After you shampoo, rinse your hair and body thoroughly to remove the  shampoo.                           4.  Use CHG as you would any other liquid  soap.  You can apply chg directly  to the skin and wash                       Gently with a scrungie or clean washcloth.  5.  Apply the CHG Soap to your body ONLY FROM THE NECK DOWN.   Do not use on face/ open                           Wound or open sores. Avoid contact with eyes, ears mouth and genitals (private parts).                       Wash face,  Genitals (private parts) with your normal soap.             6.  Wash thoroughly, paying special attention to the area where your surgery  will be performed.  7.  Thoroughly rinse your body with warm water from the neck down.  8.  DO NOT shower/wash with your normal soap after using and rinsing off  the CHG Soap.                9.  Pat yourself dry with a clean towel.            10.  Wear clean pajamas.            11.  Place clean sheets on your bed the night of your first shower and do not  sleep with pets. Day of Surgery : Do not apply any lotions/deodorants the morning of surgery.  Please wear clean clothes to the hospital/surgery center.  FAILURE TO FOLLOW THESE INSTRUCTIONS MAY RESULT IN THE CANCELLATION OF YOUR SURGERY PATIENT SIGNATURE_________________________________  NURSE SIGNATURE__________________________________  ________________________________________________________________________  WHAT IS A BLOOD TRANSFUSION? Blood Transfusion  Information  A transfusion is the replacement of blood or some of its parts. Blood is made up of multiple cells which provide different functions.  Red blood cells carry oxygen and are used for blood loss replacement.  White blood cells fight against infection.  Platelets control bleeding.  Plasma helps clot blood.  Other blood products are available for specialized needs, such as hemophilia or other clotting disorders. BEFORE THE TRANSFUSION  Who gives blood for transfusions?   Healthy volunteers who are fully evaluated to make sure their blood is safe. This is blood bank blood. Transfusion therapy is the safest it has ever been in the practice of medicine. Before blood is taken from a donor, a complete history is taken to make sure that person has no history of diseases nor engages in risky social behavior (examples are intravenous drug use or sexual activity with multiple partners). The donor's travel history is screened to minimize risk of transmitting infections, such as malaria. The donated blood is tested for signs of infectious diseases, such as HIV and hepatitis. The blood is then tested to be sure it is compatible with you in order to minimize the chance of a transfusion reaction. If you or a relative donates blood, this is often done in anticipation of surgery and is not appropriate for emergency situations. It takes many days to process the donated blood. RISKS AND COMPLICATIONS Although transfusion therapy is very safe and saves many lives, the main dangers of transfusion include:   Getting an infectious disease.  Developing a transfusion reaction. This is an allergic reaction to something in the blood you  were given. Every precaution is taken to prevent this. The decision to have a blood transfusion has been considered carefully by your caregiver before blood is given. Blood is not given unless the benefits outweigh the risks. AFTER THE TRANSFUSION  Right after receiving a  blood transfusion, you will usually feel much better and more energetic. This is especially true if your red blood cells have gotten low (anemic). The transfusion raises the level of the red blood cells which carry oxygen, and this usually causes an energy increase.  The nurse administering the transfusion will monitor you carefully for complications. HOME CARE INSTRUCTIONS  No special instructions are needed after a transfusion. You may find your energy is better. Speak with your caregiver about any limitations on activity for underlying diseases you may have. SEEK MEDICAL CARE IF:   Your condition is not improving after your transfusion.  You develop redness or irritation at the intravenous (IV) site. SEEK IMMEDIATE MEDICAL CARE IF:  Any of the following symptoms occur over the next 12 hours:  Shaking chills.  You have a temperature by mouth above 102 F (38.9 C), not controlled by medicine.  Chest, back, or muscle pain.  People around you feel you are not acting correctly or are confused.  Shortness of breath or difficulty breathing.  Dizziness and fainting.  You get a rash or develop hives.  You have a decrease in urine output.  Your urine turns a dark color or changes to pink, red, or brown. Any of the following symptoms occur over the next 10 days:  You have a temperature by mouth above 102 F (38.9 C), not controlled by medicine.  Shortness of breath.  Weakness after normal activity.  The white part of the eye turns yellow (jaundice).  You have a decrease in the amount of urine or are urinating less often.  Your urine turns a dark color or changes to pink, red, or brown. Document Released: 02/04/2000 Document Revised: 05/01/2011 Document Reviewed: 09/23/2007 [facility] Patient Information 2014 [place], [place].  _______________________________________________________________________

## 2015-07-26 ENCOUNTER — Other Ambulatory Visit: Payer: Self-pay

## 2015-07-26 ENCOUNTER — Encounter (HOSPITAL_COMMUNITY)
Admission: RE | Admit: 2015-07-26 | Discharge: 2015-07-26 | Disposition: A | Discharge status: Home / Self Care | Payer: Medicare Other | Source: Ambulatory Visit | Attending: Urology | Admitting: Urology

## 2015-07-26 ENCOUNTER — Encounter (HOSPITAL_COMMUNITY): Payer: Self-pay

## 2015-07-26 HISTORY — DX: Major depressive disorder, single episode, unspecified: F32.9

## 2015-07-26 HISTORY — DX: Depression, unspecified: F32.A

## 2015-07-26 HISTORY — DX: Unspecified osteoarthritis, unspecified site: M19.90

## 2015-07-26 LAB — CBC
HCT: 35.4 % — ABNORMAL LOW (ref 36.0–46.0)
HEMOGLOBIN: 11.8 g/dL — AB (ref 12.0–15.0)
MCH: 31.1 pg (ref 26.0–34.0)
MCHC: 33.3 g/dL (ref 30.0–36.0)
MCV: 93.2 fL (ref 78.0–100.0)
Platelets: 246 10*3/uL (ref 150–400)
RBC: 3.8 MIL/uL — ABNORMAL LOW (ref 3.87–5.11)
RDW: 16.5 % — ABNORMAL HIGH (ref 11.5–15.5)
WBC: 7.3 10*3/uL (ref 4.0–10.5)

## 2015-07-26 LAB — BASIC METABOLIC PANEL
ANION GAP: 7 (ref 5–15)
BUN: 31 mg/dL — AB (ref 6–20)
CALCIUM: 9.5 mg/dL (ref 8.9–10.3)
CO2: 27 mmol/L (ref 22–32)
CREATININE: 1.18 mg/dL — AB (ref 0.44–1.00)
Chloride: 102 mmol/L (ref 101–111)
GFR calc Af Amer: 48 mL/min — ABNORMAL LOW (ref >60)
GFR, EST NON AFRICAN AMERICAN: 42 mL/min — AB (ref >60)
GLUCOSE: 155 mg/dL — AB (ref 65–99)
Potassium: 4.3 mmol/L (ref 3.5–5.1)
Sodium: 136 mmol/L (ref 135–145)

## 2015-07-26 LAB — ABO/RH: ABO/RH(D): A POS

## 2015-07-26 LAB — PROTIME-INR
INR: 1.1 (ref 0.00–1.49)
PROTHROMBIN TIME: 14 s (ref 11.6–15.2)

## 2015-07-26 LAB — APTT: aPTT: 29 seconds (ref 24–37)

## 2015-07-26 NOTE — Progress Notes (Addendum)
Patient spoke of some right arm weakness at time of preop appointment over the last 2-3 weeks.  When getting her to sign consent form patient states " I cannot sign things like I used to".  Noted slight right facial droop- sister reports that as baseline for patient and nothing new.  .  No other changes in status.  Patient does have some slight dementia .  Vital signs within normal limits.  Spoke with anesthesia Dr Kalman Shan and made him aware of previous cardiac history of pacemaker along with history of atrial fib and patient on Eliquis along with above.  Dr Kalman Shan stated anesthesia would evaluate her am of surgery.  Sister, Griffin Basil was made aware to notify current MDs if patient develops any further symptoms. And to call 911 if necessary.

## 2015-07-26 NOTE — Progress Notes (Signed)
Clearance on chart- Dr Lovena Le- 06/30/2015  LOV- Dr Lovena Le- 03/23/2015 on chart  EKG- 03/23/2015- EPIC  03/23/2015- Last Device Check- in EPIC  03/09/2015- [place]  2V CXR- 04/22/15- EPIC

## 2015-07-27 LAB — HEMOGLOBIN A1C
HEMOGLOBIN A1C: 6.7 % — AB (ref 4.8–5.6)
MEAN PLASMA GLUCOSE: 146 mg/dL

## 2015-07-28 NOTE — Progress Notes (Signed)
Urine culture preliminary results done 07/26/2015 routed via EPIC to Dr Louis Meckel on 07/27/15 and 07/28/15.  Spoke with lab on 07/28/15 and they have not yet identified bacteria on culture .

## 2015-07-28 NOTE — Anesthesia Preprocedure Evaluation (Addendum)
Anesthesia Evaluation  Patient identified by MRN, date of birth, ID band Patient awake    Reviewed: Allergy & Precautions, H&P , NPO status , Patient's Chart, lab work & pertinent test results  Airway Mallampati: II  TM Distance: >3 FB Neck ROM: full    Dental  (+) Dental Advisory Given, Edentulous Upper, Missing All front lower teeth are missing:   Pulmonary neg pulmonary ROS,    Pulmonary exam normal breath sounds clear to auscultation       Cardiovascular Exercise Tolerance: Poor hypertension, Pt. on medications + Peripheral Vascular Disease and +CHF  Normal cardiovascular exam+ dysrhythmias Atrial Fibrillation + pacemaker  Rhythm:regular Rate:Normal  Mild pulmonary htn.  Recent echo EF 45%. Grade 2 diastolic dysfunction.   Neuro/Psych Depression CVA negative psych ROS   GI/Hepatic negative GI ROS, Neg liver ROS,   Endo/Other  diabetes, Well Controlled, Type 2, Insulin Dependent  Renal/GU negative Renal ROS  negative genitourinary   Musculoskeletal   Abdominal   Peds  Hematology negative hematology ROS (+)   Anesthesia Other Findings   Reproductive/Obstetrics negative OB ROS                          Anesthesia Physical Anesthesia Plan  ASA: III  Anesthesia Plan: General   Post-op Pain Management:    Induction: Intravenous  Airway Management Planned: Oral ETT  Additional Equipment:   Intra-op Plan:   Post-operative Plan: Extubation in OR  Informed Consent: I have reviewed the patients History and Physical, chart, labs and discussed the procedure including the risks, benefits and alternatives for the proposed anesthesia with the patient or authorized representative who has indicated his/her understanding and acceptance.   Dental Advisory Given  Plan Discussed with: CRNA and Surgeon  Anesthesia Plan Comments:         Anesthesia Quick Evaluation

## 2015-07-29 ENCOUNTER — Encounter (HOSPITAL_COMMUNITY)
Admission: AD | Disposition: A | Discharge status: Skilled Nursing Facility | Payer: Self-pay | Source: Ambulatory Visit | Attending: Urology

## 2015-07-29 ENCOUNTER — Encounter (HOSPITAL_COMMUNITY): Payer: Self-pay | Admitting: *Deleted

## 2015-07-29 ENCOUNTER — Inpatient Hospital Stay (HOSPITAL_COMMUNITY): Payer: Medicare Other | Admitting: Anesthesiology

## 2015-07-29 ENCOUNTER — Inpatient Hospital Stay (HOSPITAL_COMMUNITY)
Admission: AD | Admit: 2015-07-29 | Discharge: 2015-08-02 | DRG: 660 | Disposition: A | Discharge status: Skilled Nursing Facility | Payer: Medicare Other | Source: Ambulatory Visit | Attending: Urology | Admitting: Urology

## 2015-07-29 ENCOUNTER — Observation Stay (HOSPITAL_COMMUNITY): Payer: Medicare Other

## 2015-07-29 DIAGNOSIS — I509 Heart failure, unspecified: Secondary | ICD-10-CM | POA: Diagnosis present

## 2015-07-29 DIAGNOSIS — Z7902 Long term (current) use of antithrombotics/antiplatelets: Secondary | ICD-10-CM | POA: Diagnosis not present

## 2015-07-29 DIAGNOSIS — F329 Major depressive disorder, single episode, unspecified: Secondary | ICD-10-CM | POA: Diagnosis present

## 2015-07-29 DIAGNOSIS — S27809A Unspecified injury of diaphragm, initial encounter: Secondary | ICD-10-CM

## 2015-07-29 DIAGNOSIS — Z1639 Resistance to other specified antimicrobial drug: Secondary | ICD-10-CM | POA: Diagnosis present

## 2015-07-29 DIAGNOSIS — I110 Hypertensive heart disease with heart failure: Secondary | ICD-10-CM | POA: Diagnosis present

## 2015-07-29 DIAGNOSIS — F0390 Unspecified dementia without behavioral disturbance: Secondary | ICD-10-CM | POA: Diagnosis present

## 2015-07-29 DIAGNOSIS — N118 Other chronic tubulo-interstitial nephritis: Secondary | ICD-10-CM | POA: Diagnosis present

## 2015-07-29 DIAGNOSIS — Z794 Long term (current) use of insulin: Secondary | ICD-10-CM

## 2015-07-29 DIAGNOSIS — I272 Other secondary pulmonary hypertension: Secondary | ICD-10-CM | POA: Diagnosis present

## 2015-07-29 DIAGNOSIS — Z7982 Long term (current) use of aspirin: Secondary | ICD-10-CM

## 2015-07-29 DIAGNOSIS — I429 Cardiomyopathy, unspecified: Secondary | ICD-10-CM | POA: Diagnosis present

## 2015-07-29 DIAGNOSIS — N289 Disorder of kidney and ureter, unspecified: Secondary | ICD-10-CM | POA: Diagnosis present

## 2015-07-29 DIAGNOSIS — E1151 Type 2 diabetes mellitus with diabetic peripheral angiopathy without gangrene: Secondary | ICD-10-CM | POA: Diagnosis present

## 2015-07-29 DIAGNOSIS — E7800 Pure hypercholesterolemia, unspecified: Secondary | ICD-10-CM | POA: Diagnosis present

## 2015-07-29 DIAGNOSIS — I4891 Unspecified atrial fibrillation: Secondary | ICD-10-CM | POA: Diagnosis present

## 2015-07-29 DIAGNOSIS — J9572 Accidental puncture and laceration of a respiratory system organ or structure during other procedure: Secondary | ICD-10-CM | POA: Diagnosis not present

## 2015-07-29 DIAGNOSIS — Z853 Personal history of malignant neoplasm of breast: Secondary | ICD-10-CM

## 2015-07-29 DIAGNOSIS — Z87442 Personal history of urinary calculi: Secondary | ICD-10-CM

## 2015-07-29 DIAGNOSIS — B961 Klebsiella pneumoniae [K. pneumoniae] as the cause of diseases classified elsewhere: Secondary | ICD-10-CM | POA: Diagnosis present

## 2015-07-29 DIAGNOSIS — Z87440 Personal history of urinary (tract) infections: Secondary | ICD-10-CM | POA: Diagnosis not present

## 2015-07-29 DIAGNOSIS — N12 Tubulo-interstitial nephritis, not specified as acute or chronic: Principal | ICD-10-CM | POA: Diagnosis present

## 2015-07-29 DIAGNOSIS — N390 Urinary tract infection, site not specified: Secondary | ICD-10-CM | POA: Diagnosis present

## 2015-07-29 HISTORY — PX: LAPAROSCOPIC NEPHRECTOMY: SHX1930

## 2015-07-29 HISTORY — DX: Other chronic tubulo-interstitial nephritis: N11.8

## 2015-07-29 LAB — BASIC METABOLIC PANEL
ANION GAP: 10 (ref 5–15)
BUN: 27 mg/dL — ABNORMAL HIGH (ref 6–20)
CALCIUM: 8.8 mg/dL — AB (ref 8.9–10.3)
CO2: 24 mmol/L (ref 22–32)
CREATININE: 1.54 mg/dL — AB (ref 0.44–1.00)
Chloride: 100 mmol/L — ABNORMAL LOW (ref 101–111)
GFR, EST AFRICAN AMERICAN: 35 mL/min — AB (ref >60)
GFR, EST NON AFRICAN AMERICAN: 30 mL/min — AB (ref >60)
Glucose, Bld: 229 mg/dL — ABNORMAL HIGH (ref 65–99)
Potassium: 4.3 mmol/L (ref 3.5–5.1)
SODIUM: 134 mmol/L — AB (ref 135–145)

## 2015-07-29 LAB — GLUCOSE, CAPILLARY
GLUCOSE-CAPILLARY: 221 mg/dL — AB (ref 65–99)
GLUCOSE-CAPILLARY: 219 mg/dL — AB (ref 65–99)
GLUCOSE-CAPILLARY: 196 mg/dL — AB (ref 65–99)
GLUCOSE-CAPILLARY: 173 mg/dL — AB (ref 65–99)
Glucose-Capillary: 85 mg/dL (ref 65–99)

## 2015-07-29 LAB — HEMOGLOBIN AND HEMATOCRIT, BLOOD
HEMATOCRIT: 32.1 % — AB (ref 36.0–46.0)
Hemoglobin: 10.7 g/dL — ABNORMAL LOW (ref 12.0–15.0)

## 2015-07-29 LAB — TYPE AND SCREEN
ABO/RH(D): A POS
Antibody Screen: NEGATIVE

## 2015-07-29 SURGERY — NEPHRECTOMY, RADICAL, LAPAROSCOPIC, ADULT
Anesthesia: General | Laterality: Left

## 2015-07-29 MED ORDER — POLYETHYLENE GLYCOL 3350 17 G PO PACK: 17.0000 g | PACK | Freq: Every day | ORAL | Status: DC | PRN

## 2015-07-29 MED ORDER — OXYCODONE HCL 5 MG PO TABS
5.0000 mg | ORAL_TABLET | ORAL | Status: DC | PRN
  Administered 2015-07-31 – 2015-08-02 (×5): 5 mg via ORAL
  Filled 2015-07-29 (×5): qty 1

## 2015-07-29 MED ORDER — BENAZEPRIL HCL 10 MG PO TABS
10.0000 mg | ORAL_TABLET | Freq: Every day | ORAL | Status: DC
  Administered 2015-07-30 – 2015-08-02 (×4): 10 mg via ORAL
  Filled 2015-07-29 (×4): qty 1

## 2015-07-29 MED ORDER — SENNOSIDES-DOCUSATE SODIUM 8.6-50 MG PO TABS
2.0000 | ORAL_TABLET | Freq: Every day | ORAL | Status: DC
  Administered 2015-07-29 – 2015-08-01 (×4): 2 via ORAL
  Filled 2015-07-29 (×4): qty 2

## 2015-07-29 MED ORDER — DONEPEZIL HCL 5 MG PO TABS
10.0000 mg | ORAL_TABLET | Freq: Every day | ORAL | Status: DC
Start: 2015-07-29 — End: 2015-08-02
  Administered 2015-07-29 – 2015-08-01 (×4): 10 mg via ORAL
  Filled 2015-07-29 (×4): qty 2

## 2015-07-29 MED ORDER — ETOMIDATE 2 MG/ML IV SOLN
INTRAVENOUS | Status: AC
  Filled 2015-07-29: qty 10

## 2015-07-29 MED ORDER — DIPHENHYDRAMINE HCL 50 MG/ML IJ SOLN
12.5000 mg | Freq: Four times a day (QID) | INTRAMUSCULAR | Status: DC | PRN
  Administered 2015-07-30: 12.5 mg via INTRAVENOUS
  Filled 2015-07-29: qty 1

## 2015-07-29 MED ORDER — ROCURONIUM BROMIDE 100 MG/10ML IV SOLN
INTRAVENOUS | Status: AC
  Filled 2015-07-29: qty 1

## 2015-07-29 MED ORDER — INSULIN ASPART 100 UNIT/ML ~~LOC~~ SOLN
SUBCUTANEOUS | Status: AC
Start: 2015-07-29 — End: 2015-07-30
  Filled 2015-07-29: qty 1

## 2015-07-29 MED ORDER — EPHEDRINE SULFATE 50 MG/ML IJ SOLN
INTRAMUSCULAR | Status: AC
  Filled 2015-07-29: qty 1

## 2015-07-29 MED ORDER — LACTATED RINGERS IV SOLN: INTRAVENOUS | Status: DC

## 2015-07-29 MED ORDER — ACETAMINOPHEN 10 MG/ML IV SOLN
INTRAVENOUS | Status: AC
  Filled 2015-07-29: qty 100

## 2015-07-29 MED ORDER — LACTATED RINGERS IV SOLN
INTRAVENOUS | Status: DC
  Administered 2015-07-30: 11:00:00 via INTRAVENOUS

## 2015-07-29 MED ORDER — LIDOCAINE HCL (CARDIAC) 20 MG/ML IV SOLN
INTRAVENOUS | Status: AC
  Filled 2015-07-29: qty 5

## 2015-07-29 MED ORDER — LACTATED RINGERS IR SOLN
Status: DC | PRN
  Administered 2015-07-29: 1

## 2015-07-29 MED ORDER — CEFAZOLIN SODIUM-DEXTROSE 2-4 GM/100ML-% IV SOLN
2.0000 g | INTRAVENOUS | Status: AC
  Administered 2015-07-29: 2 g via INTRAVENOUS
  Filled 2015-07-29: qty 100

## 2015-07-29 MED ORDER — CIPROFLOXACIN IN D5W 400 MG/200ML IV SOLN
400.0000 mg | INTRAVENOUS | Status: AC
  Administered 2015-07-29: 400 mg via INTRAVENOUS

## 2015-07-29 MED ORDER — PROPOFOL 10 MG/ML IV BOLUS
INTRAVENOUS | Status: AC
  Filled 2015-07-29: qty 20

## 2015-07-29 MED ORDER — ATORVASTATIN CALCIUM 40 MG PO TABS
80.0000 mg | ORAL_TABLET | Freq: Every day | ORAL | Status: DC
  Administered 2015-07-29 – 2015-08-01 (×4): 80 mg via ORAL
  Filled 2015-07-29 (×4): qty 2

## 2015-07-29 MED ORDER — ETOMIDATE 2 MG/ML IV SOLN
INTRAVENOUS | Status: DC | PRN
  Administered 2015-07-29: 6 mg via INTRAVENOUS

## 2015-07-29 MED ORDER — SODIUM CHLORIDE 0.9 % IJ SOLN
INTRAMUSCULAR | Status: AC
  Filled 2015-07-29: qty 10

## 2015-07-29 MED ORDER — LACTATED RINGERS IV BOLUS (SEPSIS)
250.0000 mL | Freq: Once | INTRAVENOUS | Status: AC
  Administered 2015-07-29: 250 mL via INTRAVENOUS

## 2015-07-29 MED ORDER — ACETAMINOPHEN 10 MG/ML IV SOLN
1000.0000 mg | Freq: Four times a day (QID) | INTRAVENOUS | Status: AC
  Administered 2015-07-29 – 2015-07-30 (×3): 1000 mg via INTRAVENOUS
  Filled 2015-07-29 (×6): qty 100

## 2015-07-29 MED ORDER — INSULIN ASPART 100 UNIT/ML ~~LOC~~ SOLN
0.0000 [IU] | SUBCUTANEOUS | Status: DC
  Administered 2015-07-29: 5 [IU] via SUBCUTANEOUS

## 2015-07-29 MED ORDER — PHENYLEPHRINE HCL 10 MG/ML IJ SOLN
INTRAMUSCULAR | Status: AC
  Filled 2015-07-29: qty 1

## 2015-07-29 MED ORDER — FENTANYL CITRATE (PF) 100 MCG/2ML IJ SOLN
INTRAMUSCULAR | Status: DC | PRN
  Administered 2015-07-29 (×2): 50 ug via INTRAVENOUS

## 2015-07-29 MED ORDER — ROCURONIUM BROMIDE 100 MG/10ML IV SOLN
INTRAVENOUS | Status: DC | PRN
  Administered 2015-07-29: 10 mg via INTRAVENOUS
  Administered 2015-07-29: 50 mg via INTRAVENOUS
  Administered 2015-07-29 (×2): 10 mg via INTRAVENOUS
  Administered 2015-07-29: 20 mg via INTRAVENOUS
  Administered 2015-07-29 (×5): 10 mg via INTRAVENOUS

## 2015-07-29 MED ORDER — AMIODARONE HCL 200 MG PO TABS
200.0000 mg | ORAL_TABLET | Freq: Every day | ORAL | Status: DC
  Administered 2015-07-30 – 2015-08-02 (×4): 200 mg via ORAL
  Filled 2015-07-29 (×4): qty 1

## 2015-07-29 MED ORDER — BUPIVACAINE HCL 0.25 % IJ SOLN
INTRAMUSCULAR | Status: DC | PRN
  Administered 2015-07-29: 11 mL

## 2015-07-29 MED ORDER — PHENYLEPHRINE HCL 10 MG/ML IJ SOLN: INTRAMUSCULAR | Status: DC | PRN

## 2015-07-29 MED ORDER — ONDANSETRON HCL 4 MG/2ML IJ SOLN
INTRAMUSCULAR | Status: DC | PRN
  Administered 2015-07-29: 4 mg via INTRAVENOUS

## 2015-07-29 MED ORDER — CEFAZOLIN SODIUM 1-5 GM-% IV SOLN
1.0000 g | Freq: Three times a day (TID) | INTRAVENOUS | Status: DC
  Administered 2015-07-29 – 2015-07-30 (×2): 1 g via INTRAVENOUS
  Filled 2015-07-29 (×3): qty 50

## 2015-07-29 MED ORDER — SODIUM CHLORIDE 0.9 % IJ SOLN
INTRAMUSCULAR | Status: AC
  Filled 2015-07-29: qty 50

## 2015-07-29 MED ORDER — BUPIVACAINE LIPOSOME 1.3 % IJ SUSP
20.0000 mL | Freq: Once | INTRAMUSCULAR | Status: AC
  Administered 2015-07-29: 20 mL
  Filled 2015-07-29: qty 20

## 2015-07-29 MED ORDER — SODIUM CHLORIDE 0.9% FLUSH: 3.0000 mL | INTRAVENOUS | Status: DC | PRN

## 2015-07-29 MED ORDER — AMLODIPINE BESY-BENAZEPRIL HCL 5-10 MG PO CAPS: 1.0000 | ORAL_CAPSULE | Freq: Every day | ORAL | Status: DC

## 2015-07-29 MED ORDER — PHENYLEPHRINE 40 MCG/ML (10ML) SYRINGE FOR IV PUSH (FOR BLOOD PRESSURE SUPPORT)
PREFILLED_SYRINGE | INTRAVENOUS | Status: AC
  Filled 2015-07-29: qty 10

## 2015-07-29 MED ORDER — SODIUM CHLORIDE 0.9 % IV SOLN: 250.0000 mL | INTRAVENOUS | Status: DC | PRN

## 2015-07-29 MED ORDER — MORPHINE SULFATE (PF) 2 MG/ML IV SOLN: 1.0000 mg | INTRAVENOUS | Status: DC | PRN

## 2015-07-29 MED ORDER — INSULIN ASPART 100 UNIT/ML ~~LOC~~ SOLN
0.0000 [IU] | Freq: Three times a day (TID) | SUBCUTANEOUS | Status: DC
  Administered 2015-07-29: 2 [IU] via SUBCUTANEOUS
  Administered 2015-07-30: 1 [IU] via SUBCUTANEOUS

## 2015-07-29 MED ORDER — PHENYLEPHRINE HCL 10 MG/ML IJ SOLN
INTRAMUSCULAR | Status: DC | PRN
  Administered 2015-07-29: 80 ug via INTRAVENOUS
  Administered 2015-07-29 (×2): 40 ug via INTRAVENOUS

## 2015-07-29 MED ORDER — DIPHENHYDRAMINE HCL 12.5 MG/5ML PO ELIX: 12.5000 mg | ORAL_SOLUTION | Freq: Four times a day (QID) | ORAL | Status: DC | PRN

## 2015-07-29 MED ORDER — PROPOFOL 10 MG/ML IV BOLUS
INTRAVENOUS | Status: DC | PRN
  Administered 2015-07-29: 60 mg via INTRAVENOUS

## 2015-07-29 MED ORDER — SODIUM CHLORIDE 0.9% FLUSH: 3.0000 mL | Freq: Two times a day (BID) | INTRAVENOUS | Status: DC

## 2015-07-29 MED ORDER — ONDANSETRON HCL 4 MG/2ML IJ SOLN
INTRAMUSCULAR | Status: AC
  Filled 2015-07-29: qty 2

## 2015-07-29 MED ORDER — SUGAMMADEX SODIUM 200 MG/2ML IV SOLN
INTRAVENOUS | Status: DC | PRN
  Administered 2015-07-29: 200 mg via INTRAVENOUS

## 2015-07-29 MED ORDER — CIPROFLOXACIN IN D5W 400 MG/200ML IV SOLN
INTRAVENOUS | Status: AC
  Filled 2015-07-29: qty 200

## 2015-07-29 MED ORDER — LACTATED RINGERS IV SOLN
INTRAVENOUS | Status: DC | PRN
  Administered 2015-07-29 (×2): via INTRAVENOUS

## 2015-07-29 MED ORDER — PHENYLEPHRINE HCL 10 MG/ML IJ SOLN
10.0000 mg | INTRAVENOUS | Status: DC | PRN
  Administered 2015-07-29: 10 ug/min via INTRAVENOUS

## 2015-07-29 MED ORDER — SODIUM CHLORIDE 0.9 % IJ SOLN
INTRAMUSCULAR | Status: DC | PRN
  Administered 2015-07-29: 20 mL

## 2015-07-29 MED ORDER — LACTATED RINGERS IV SOLN
INTRAVENOUS | Status: DC
  Administered 2015-07-29: 23:00:00 via INTRAVENOUS

## 2015-07-29 MED ORDER — AMLODIPINE BESYLATE 5 MG PO TABS
5.0000 mg | ORAL_TABLET | Freq: Every day | ORAL | Status: DC
  Administered 2015-07-31 – 2015-08-02 (×3): 5 mg via ORAL
  Filled 2015-07-29 (×4): qty 1

## 2015-07-29 MED ORDER — HYDROMORPHONE HCL 1 MG/ML IJ SOLN: 0.2500 mg | INTRAMUSCULAR | Status: DC | PRN

## 2015-07-29 MED ORDER — SUGAMMADEX SODIUM 200 MG/2ML IV SOLN
INTRAVENOUS | Status: AC
  Filled 2015-07-29: qty 2

## 2015-07-29 MED ORDER — LIDOCAINE HCL (CARDIAC) 20 MG/ML IV SOLN
INTRAVENOUS | Status: DC | PRN
  Administered 2015-07-29: 40 mg via INTRAVENOUS

## 2015-07-29 MED ORDER — SODIUM CHLORIDE 0.9 % IV SOLN
INTRAVENOUS | Status: DC
  Filled 2015-07-29: qty 6

## 2015-07-29 MED ORDER — CEFAZOLIN SODIUM-DEXTROSE 2-4 GM/100ML-% IV SOLN
INTRAVENOUS | Status: AC
  Filled 2015-07-29: qty 100

## 2015-07-29 MED ORDER — BUPIVACAINE HCL (PF) 0.25 % IJ SOLN
INTRAMUSCULAR | Status: AC
  Filled 2015-07-29: qty 30

## 2015-07-29 MED ORDER — BACITRACIN-NEOMYCIN-POLYMYXIN 400-5-5000 EX OINT: 1.0000 "application " | TOPICAL_OINTMENT | Freq: Three times a day (TID) | CUTANEOUS | Status: DC | PRN

## 2015-07-29 MED ORDER — ALLOPURINOL 300 MG PO TABS
300.0000 mg | ORAL_TABLET | Freq: Every day | ORAL | Status: DC
  Administered 2015-07-30 – 2015-08-02 (×4): 300 mg via ORAL
  Filled 2015-07-29 (×4): qty 1

## 2015-07-29 MED ORDER — ONDANSETRON HCL 4 MG/2ML IJ SOLN
4.0000 mg | INTRAMUSCULAR | Status: DC | PRN
  Administered 2015-07-29: 4 mg via INTRAVENOUS

## 2015-07-29 MED ORDER — FENTANYL CITRATE (PF) 250 MCG/5ML IJ SOLN
INTRAMUSCULAR | Status: AC
  Filled 2015-07-29: qty 5

## 2015-07-29 SURGICAL SUPPLY — 64 items
APPLICATOR ARISTA FLEXITIP XL (MISCELLANEOUS) ×3 IMPLANT
APPLICATOR SURGIFLO ENDO (HEMOSTASIS) ×3 IMPLANT
APPLIER CLIP ROT 10 11.4 M/L (STAPLE)
BAG ZIPLOCK 12X15 (MISCELLANEOUS) ×3 IMPLANT
BLADE EXTENDED COATED 6.5IN (ELECTRODE) IMPLANT
BLADE SURG SZ10 CARB STEEL (BLADE) ×3 IMPLANT
CHLORAPREP W/TINT 26ML (MISCELLANEOUS) ×3 IMPLANT
CLIP APPLIE ROT 10 11.4 M/L (STAPLE) IMPLANT
CLIP LIGATING HEM O LOK PURPLE (MISCELLANEOUS) ×3 IMPLANT
CLIP LIGATING HEMO LOK XL GOLD (MISCELLANEOUS) ×3 IMPLANT
CLIP LIGATING HEMO O LOK GREEN (MISCELLANEOUS) ×3 IMPLANT
CLIP SUT LAPRA TY ABSORB (SUTURE) ×3 IMPLANT
COVER SURGICAL LIGHT HANDLE (MISCELLANEOUS) ×3 IMPLANT
CUTTER FLEX LINEAR 45M (STAPLE) ×3 IMPLANT
DECANTER SPIKE VIAL GLASS SM (MISCELLANEOUS) ×3 IMPLANT
DRAIN CHANNEL 10F 3/8 F FF (DRAIN) IMPLANT
DRAPE INCISE IOBAN 66X45 STRL (DRAPES) ×3 IMPLANT
DRAPE WARM FLUID 44X44 (DRAPE) IMPLANT
ELECT PENCIL ROCKER SW 15FT (MISCELLANEOUS) ×3 IMPLANT
ELECT REM PT RETURN 9FT ADLT (ELECTROSURGICAL) ×3
ELECTRODE REM PT RTRN 9FT ADLT (ELECTROSURGICAL) ×1 IMPLANT
EVACUATOR SILICONE 100CC (DRAIN) IMPLANT
GLOVE BIOGEL M STRL SZ7.5 (GLOVE) ×3 IMPLANT
GOWN STRL REUS W/TWL LRG LVL3 (GOWN DISPOSABLE) ×6 IMPLANT
HEMOSTAT ARISTA ABSORB 3G PWDR (MISCELLANEOUS) ×3 IMPLANT
HEMOSTAT SURGICEL 4X8 (HEMOSTASIS) IMPLANT
KIT BASIN OR (CUSTOM PROCEDURE TRAY) ×3 IMPLANT
LIQUID BAND (GAUZE/BANDAGES/DRESSINGS) ×3 IMPLANT
MANIFOLD NEPTUNE II (INSTRUMENTS) ×3 IMPLANT
MARKER SKIN DUAL TIP RULER LAB (MISCELLANEOUS) ×3 IMPLANT
PAD POSITIONING PINK XL (MISCELLANEOUS) ×3 IMPLANT
POSITIONER SURGICAL ARM (MISCELLANEOUS) ×6 IMPLANT
POUCH ENDO CATCH II 15MM (MISCELLANEOUS) ×6 IMPLANT
RELOAD 45 VASCULAR/THIN (ENDOMECHANICALS) ×30 IMPLANT
RELOAD STAPLE TA45 3.5 REG BLU (ENDOMECHANICALS) IMPLANT
RETRACTOR LAPSCP 12X46 CVD (ENDOMECHANICALS) IMPLANT
RTRCTR LAPSCP 12X46 CVD (ENDOMECHANICALS)
SCISSORS LAP 5X35 DISP (ENDOMECHANICALS) ×3 IMPLANT
SET IRRIG TUBING LAPAROSCOPIC (IRRIGATION / IRRIGATOR) ×3 IMPLANT
SHEARS HARMONIC ACE PLUS 36CM (ENDOMECHANICALS) ×3 IMPLANT
SLEEVE XCEL OPT CAN 5 100 (ENDOMECHANICALS) ×6 IMPLANT
SPONGE LAP 4X18 X RAY DECT (DISPOSABLE) IMPLANT
SPONGE SURGIFOAM ABS GEL 100 (HEMOSTASIS) IMPLANT
SUT ETHILON 3 0 PS 1 (SUTURE) IMPLANT
SUT MNCRL AB 4-0 PS2 18 (SUTURE) ×6 IMPLANT
SUT PDS AB 0 CT1 36 (SUTURE) ×6 IMPLANT
SUT PDS AB 1 TP1 96 (SUTURE) ×3 IMPLANT
SUT VIC AB 0 CT1 36 (SUTURE) ×3 IMPLANT
SUT VIC AB 2-0 CT1 27 (SUTURE) ×2
SUT VIC AB 2-0 CT1 27XBRD (SUTURE) ×1 IMPLANT
SUT VIC AB 3-0 CT1 36 (SUTURE) ×3 IMPLANT
SUT VIC AB 3-0 SH 27 (SUTURE) ×2
SUT VIC AB 3-0 SH 27XBRD (SUTURE) ×1 IMPLANT
SUT VICRYL 0 UR6 27IN ABS (SUTURE) ×9 IMPLANT
TAPE CLOTH 4X10 WHT NS (GAUZE/BANDAGES/DRESSINGS) ×3 IMPLANT
TOWEL OR 17X26 10 PK STRL BLUE (TOWEL DISPOSABLE) ×3 IMPLANT
TOWEL OR NON WOVEN STRL DISP B (DISPOSABLE) ×3 IMPLANT
TRAY FOLEY CATH 14FRSI W/METER (CATHETERS) ×3 IMPLANT
TRAY FOLEY CATH 16FR SILVER (SET/KITS/TRAYS/PACK) IMPLANT
TRAY FOLEY W/METER SILVER 16FR (SET/KITS/TRAYS/PACK) IMPLANT
TRAY LAPAROSCOPIC (CUSTOM PROCEDURE TRAY) ×3 IMPLANT
TROCAR BLADELESS OPT 5 100 (ENDOMECHANICALS) ×3 IMPLANT
TROCAR XCEL 12X100 BLDLESS (ENDOMECHANICALS) ×3 IMPLANT
YANKAUER SUCT BULB TIP 10FT TU (MISCELLANEOUS) ×3 IMPLANT

## 2015-07-29 NOTE — Op Note (Signed)
Preoperative diagnosis:  1. 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 same   Procedure: 1. Laparoscopic right simple nephrectomy 2. Laparoscopic repair of right diaphragmatic injury  Surgeon: Ardis Hughs, MD  Resident Assistant: E. Harvel Ricks, MD  Anesthesia: General  Complications: None  Intraoperative findings:  1. Severely chronically infected and inflamed left kidney with dense and significant adhesions between the psoas, abdominal side wall, diaphragm and surrounding structures. 2. One renal artery and one renal vein were stapled separately with excellent hemostasis 3. Intra-operative small diaphragmatic injury due to severe and dense adhesions between gerota's fascia and the posterolateral diaphragm. No respiratory distress or changes with ventilation occurred. This was repaired primarily in a two layer running closure. Post-operative CXR with no pneumothorax or pleural effusion.  EBL: 25 mL  Specimens:  Right kidney and proximal ureter  Indication: Allison Dunn is a 82 y.o. patient with a chronically infected and atrophic left kidney with only 25% split renal function.  After reviewing the management options for treatment, he elected to proceed with the above surgical procedure(s). We have discussed the potential benefits and risks of the procedure, side effects of the proposed treatment, the likelihood of the patient achieving the goals of the procedure, and any potential problems that might occur during the procedure or recuperation. Informed consent has been obtained.  Description of procedure:  A site was selected superior to the umbilicus for placement of the camera port. This was placed using a standard open Hassan technique which allowed entry into the peritoneal cavity under direct vision and without difficulty. A 12 mm Hassan cannula was placed and a pneumoperitoneum established. The camera was then used to  inspect the abdomen and there was no evidence of any intra-abdominal injuries or other abnormalities. The remaining abdominal ports were then placed under visual guidance.  A second 12 mm port was placed in the right lower quadrant approximately 8 cm away from the camera. A 5 mm port was placed in the right upper quadrant again 8 cm away from the camera.  A second 5 mm port was then placed in the right lower quadrant at the anterior axillary line lateral to the 12 mm port. All ports were placed under direct vision without difficulty.   Examination of the kidney demonstrated a hydronephrotic kidney with minimal parenchyma with significant adhesions between gerota's fascia and the abdominal side wall. The white line of Toldt was incised allowing the colon to be mobilized medially and the plane between the mesocolon and the anterior layer of Gerota's fascia to be developed and the kidney exposed. The ureter and gonadal vein were identified inferiorly and the ureter was lifted anteriorly off the psoas muscle. There were dense adhesions between the posterior aspect of Gerota's fascia and the psoas muscle due to her chronic infections and inflammation.  Dissection proceeded  superiorly carefully along the gonadal vein until the renal vein was identified. The renal hilum was then carefully isolated with a combination of blunt and sharp dissectiong allowing the renal arterial and venous structures to be separated and isolated.   The renal artery was isolated and ligated with a 45 mm endovascular staple load.  The renal vein was then isolated and also ligated and divided with a second 45 mm endovascular staple load. Gerota's fascia was intentionally entered superiorly and the space between the adrenal gland and the kidney was developed allowing the adrenal gland to be spared. The adrenal gland was dissected off and then  the attachments to the kidney were taken down with another stapler load. Finally, the splenorenal  ligaments were also divided with a second staple fire. The lateral and posterior attachments to the kidney were densely scarred to the abdominal side wall and diaphragm. These were carefully and methodically taken down with the harmonic scalpel making sure to remove the kidney in its entirety.  During this process there was a small opening made in the diaphragm.  We then placed hemolock clips on the up and down side of the ureter and the gonadal vein separately and then sharply divided them.   All the remaining lateral and posterior attachements to the kidney were divided. Once the kidney was free from its attachments we turned our attention to the diaphragmatic injury. Of note, there was no associated respiratory distress or difficulty with ventilation. Using a 4-0 vicryl with a hem-o-lock and weck clips on one end the diaphragmatic opening was closed in a running fashion in two layers. Examination of this demonstrated resolution of the opening and well approximated edges. The diaphragmatic movements normalized and she remained stable from a respiratory/ventilatory standpoint.   Next, the kidney/ureter specimen was then placed into a 15 mm Endocatch II retrieval bag. The renal hilum, splenic, adrenal bed and gonadal vein areas were each inspected and hemostasis was ensured with the pneomperitoneal pressures lowered. There was excellent hemostasis.  The camera was then brought to the 12 mm port laterally and the camera port was removed and the fascia closed with the preplaced facial 0 Vicryl sutures.  All the ports were then removed under visual guidance. The lateral 12 mm port and 5 mm port were then connected sharply with a 15 blade. Then opened this incision down to the external oblique fascia. We then spread the muscle fibers down to the internal oblique fascia which we then opened with cautery. These were then spread in all muscle spared and the posterior peritoneum was opened. The rectus muscle was pulled  medially. The specimen was then removed through these incision. The internal oblique fascia and peritoneum was then closed with a 0 Vicryl in a running fashion. The external oblique fascia was then closed with a 0 PDS in a running fashion.  All incisions were injected with local anesthetic and reapproximated at the skin with 4-0 monocryl sutures. Dermabond was applied to the skin. The patient tolerated the procedure well and without complications and was transferred to the recovery unit in satisfactory condition. Post-operative CXR in the PACU demonstrated no pneumothorax, pleural effusion or other intra-thoracic abnormality.

## 2015-07-29 NOTE — Progress Notes (Signed)
S/p left nephrectomy.  Intv: Recovered unremarkably in PACU  - 250cc bolus for low UOP in PACU Post-op CXR normal  S: No complaints of pain Mild nausea  O: Filed Vitals:   07/29/15 1515 07/29/15 1530 07/29/15 1545 07/29/15 1600  BP: 119/49 128/48 130/48 128/48  Pulse: 59 59 60 60  Temp:   97.3 F (36.3 C) 97.6 F (36.4 C)  TempSrc:      Resp: '23 21 22 20  '$ Height:      Weight:      SpO2: 100% 100% 99% 99%    Intake/Output Summary (Last 24 hours) at 07/29/15 1805 Last data filed at 07/29/15 1545  Gross per 24 hour  Intake   1800 ml  Output    235 ml  Net   1565 ml    NAD - alert/oriented Non-labored breathing Abdomen is soft - incisions c/d/i Foley draining clear yellow urine   Recent Labs  07/29/15 1400  HGB 10.7*  HCT 32.1*    Recent Labs  07/29/15 1400  NA 134*  K 4.3  CL 100*  CO2 24  GLUCOSE 229*  BUN 27*  CREATININE 1.54*  CALCIUM 8.8*   No results for input(s): LABPT, INR in the last 72 hours. No results for input(s): PSA in the last 72 hours. No results for input(s): LABURIN in the last 72 hours. Results for orders placed or performed during the hospital encounter of 07/26/15  Urine culture     Status: Abnormal (Preliminary result)   Collection Time: 07/26/15 10:30 AM  Result Value Ref Range Status   Specimen Description URINE, RANDOM  Final   Special Requests NONE  Final   Culture (A)  Final    >=100,000 COLONIES/mL KLEBSIELLA PNEUMONIAE SUSCEPTIBILITIES TO FOLLOW Performed at [facility]    Report Status PENDING  Incomplete     Imp: stable post-op Plan: Routine post-op care Ambulate IVF  Will likely need SNF after acute hospital stay.

## 2015-07-29 NOTE — Transfer of Care (Signed)
Immediate Anesthesia Transfer of Care Note  Patient: Allison Dunn  Procedure(s) Performed: Procedure(s): LEFT LAPAROSCOPIC SIMPLE NEPHRECTOMY (Left)  Patient Location: PACU  Anesthesia Type:General  Level of Consciousness: awake, alert  and oriented  Airway & Oxygen Therapy: Patient Spontanous Breathing and Patient connected to face mask oxygen  Post-op Assessment: Report given to RN and Post -op Vital signs reviewed and stable  Post vital signs: Reviewed and stable  Last Vitals:  Filed Vitals:   07/29/15 0540  BP: 153/61  Pulse: 60  Temp: 36.4 C  Resp: 16    Last Pain: There were no vitals filed for this visit.       Complications: No apparent anesthesia complications

## 2015-07-29 NOTE — Progress Notes (Signed)
Pt without any urinary output since arrival to PACU. Dr Landry Dyke made aware and orders given for 228m IV fluid bolus. Dr HLouis Meckelin at bedside & also aware of no urinary output & in agreement with IV fluid bolus.

## 2015-07-29 NOTE — Anesthesia Procedure Notes (Signed)
Procedure Name: Intubation Date/Time: 07/29/2015 7:49 AM Performed by: Glory Buff Pre-anesthesia Checklist: Patient identified, Emergency Drugs available, Suction available and Patient being monitored Patient Re-evaluated:Patient Re-evaluated prior to inductionOxygen Delivery Method: Circle system utilized Preoxygenation: Pre-oxygenation with 100% oxygen Intubation Type: IV induction Ventilation: Mask ventilation without difficulty Laryngoscope Size: Mac and 3 Grade View: Grade II Tube type: Oral Tube size: 7.0 mm Number of attempts: 1 Airway Equipment and Method: Stylet and Oral airway Placement Confirmation: ETT inserted through vocal cords under direct vision,  positive ETCO2 and breath sounds checked- equal and bilateral Secured at: 20 cm Tube secured with: Tape Dental Injury: Teeth and Oropharynx as per pre-operative assessment

## 2015-07-29 NOTE — H&P (Signed)
Reason For Visit Follow-up for her left-sided staghorn calculi.   History of Present Illness Patient is an 82 year old African-American female recently moved down to [place] from [place]. The patient was moved down here by her sister so that she could take care of her. The patient has developed dementia over the past year or 2 which has really progressed. The patient relates that she has long had a history of recurrent urinary tract infections. She was scheduled to be treated for her staghorn stone 6 months ago but forgot about the procedure, and never showed up. She has had recurrent infections, currently not on any prophylaxis. She is not having any flank pain. She denies any gross hematuria. She has not had any fevers or chills. She was hospitalized in March with urosepsis. She is recovered from this. She also has a history of cardiomyopathy and A. fib for which she takes Plavix. She is a insulin-dependent diabetic with the last A1c 7.5%. Her last kidney function tests demonstrated a BUN/creatinine of 12.0/0.93 on 05/19/15. The patient has a history of ureteroscopy and lithotripsy several times for stones in the past.   Lasix renogram: 75% right/25% left; T1/2 - 19mn right/ 14.9 min left  Intv: Since the patient was last seen she went to the emergency room for signs and symptoms of a urinary tract infection. She indeed did have a breakthrough urinary tract infection despite being on appropriate suppressive antibiotics. She was subsequently started on trimethoprim 100 mg twice a day. Today, the patient states her symptoms have improved. She denies any flank pain. She denies any dysuria, fevers, or chills.   Past Medical History Problems  1. History of Bilateral kidney stones (N20.0) 2. History of breast cancer (Z85.3) 3. History of diabetes mellitus (Z86.39) 4. History of hypercholesterolemia (Z86.39) 5. History of hypertension (Z86.79)  Surgical History Problems  1. History of Back  Surgery  Current Meds 1. Allopurinol 300 MG Oral Tablet;  Therapy: (Recorded:13Jan2017) to Recorded 2. Amiodarone HCl - 200 MG Oral Tablet;  Therapy: (Recorded:13Jan2017) to Recorded 3. Amlodipine Besy-Benazepril HCl CAPS;  Therapy: (Recorded:13Jan2017) to Recorded 4. Aspirin 81 MG TABS;  Therapy: (Recorded:13Jan2017) to Recorded 5. Atorvastatin Calcium 80 MG Oral Tablet;  Therapy: (Recorded:13Jan2017) to Recorded 6. Clopidogrel Bisulfate 75 MG Oral Tablet;  Therapy: (Recorded:13Jan2017) to Recorded 7. Donepezil HCl - 5 MG Oral Tablet;  Therapy: (Recorded:13Jan2017) to Recorded 8. HumaLOG SOLN;  Therapy: (Recorded:13Jan2017) to Recorded 9. Levemir SOLN;  Therapy: ([id number] to Recorded 10. Multi Vitamin/Minerals TABS;   Therapy: (Recorded:13Jan2017) to Recorded 11. Nitrofurantoin Macrocrystal 50 MG Oral Capsule; TAKE 1 CAPSULE AT BEDTIME;   Therapy: 11Apr2017 to (Evaluate:10Jul2017)  Requested for: 11Apr2017; Last   Rx:11Apr2017 Ordered 12. Trimethoprim 100 MG Oral Tablet; TAKE 1 TABLET Bedtime;   Therapy: 17Apr2017 to (Evaluate:12Apr2018)  Requested for: 17Apr2017; Last   Rx:17Apr2017 Ordered 13. Vitamin B-12 TABS;   Therapy: (Recorded:13Jan2017) to Recorded 14. Vitamin D3 TABS;   Therapy: (Recorded:13Jan2017) to Recorded  Allergies Medication  1. No Known Drug Allergies  Family History Problems  1. Family history of diabetes mellitus (Z83.3) : Sister, Brother 2. Family history of malignant neoplasm (Z80.9) : Mother  Social History Problems  1. Denied: History of Alcohol use 2. Caffeine use (F15.90) 3. Has no children 4. Never a smoker 5. Retired 660 Widowed (Z63.4)  Review of Systems Patient is gaining strength, continues to have exercise intolerance, energy is improving, memory is improving, no issues with bowel movements.     Vitals Vital Signs [Data Includes:  Last 1 Day]  Recorded: [id number] 12:55PM  Height: 5 ft  Weight: 125 lb  BMI  Calculated: 24.41 BSA Calculated: 1.53 Blood Pressure: 179 / 65 Heart Rate: 60  Physical Exam The patient is interactive and gauging, she has mild memory deficits but otherwise is following along in her conversation  She is in no acute distress  Constitutional: Well nourished and well developed . No acute distress.   Neck: The appearance of the neck is normal and no neck mass is present.   Irregular heartbeat, prominent S2, question of artificial valve. No appreciable murmur.   Abdomen: The abdomen is soft and nontender. No masses are palpated. No CVA tenderness. No hernias are palpable. No hepatosplenomegaly noted.    Results/Data Urine [Data Includes: Last 1 Day]   [id number]  COLOR AMBER   APPEARANCE CLOUDY   SPECIFIC GRAVITY 1.020   pH 6.0   GLUCOSE NEGATIVE   BILIRUBIN NEGATIVE   KETONE NEGATIVE   BLOOD 3+   PROTEIN 1+   NITRITE POSITIVE   LEUKOCYTE ESTERASE 3+   SQUAMOUS EPITHELIAL/HPF NONE SEEN HPF  WBC >60 WBC/HPF  RBC 20-40 RBC/HPF  BACTERIA MANY HPF  CRYSTALS NONE SEEN HPF  CASTS NONE SEEN LPF  Yeast NONE SEEN HPF   The patient urinalysis today demonstrates ongoing infection, a urine culture was sent.   I reviewed the patient's CT scan which demonstrates a soft infectious type staghorn stone in the patient's left kidney. She also has atrophy of the parenchyma on that side. Patient's right kidney is more normal in appearance with very small nonobstructing stones.   Plan Health Maintenance  1. UA With REFLEX; [Do Not Release]; Status:Complete;   Done: [id number] 12:25PM Staghorn calculus  2. Start: Nitrofurantoin Monohyd Macro 100 MG Oral Capsule; TAKE 1 CAPSULE Every  twelve hours 3. Follow-up Schedule Surgery Office  Follow-up  Status: Hold For - Appointment   Requested for: [id number] 4. URINE CULTURE; Status:In Progress - Specimen/Data Collected;   Done: [id number]  Discussion/Summary Patient has 25% function in the left kidney - otherwise her right  kidney functions well and her kidney function is normal. I discussed two reasonable options for this patient in terms of treating her leftsdied staghorn. The first option is a PCNL. This would almost certainly require two anesthesias to ensure that she's stone free which would be necessary to prevent her from developing these infections. This would be a difficult surgery and one that she would likely be in the hospital for several days recovering. She would need a stent for several weeks in between her two surgeries and then for a week or so after the second one.  The second option is a simple nephrectomy. This maybe her best option - the option that would be quickest, likely the least amt of blood loss, and require no additional treatment. IT would likely be the easiest for her to go through. Given that her contralateral kidney is functioning well, she would likely have no sequelae with one kidney. She would not likely require dialysis.   The patient and her sister have opted to go with the simple nephrectomy. I spoke to them about the operation and the risks and benefits of the surgery in detail. We discussed the expected hospital course and recovery time. We will keep the patient on the suppressive abx until her surgery. We will try and get this scheduled for the patient in the near future.  In the meantime, the patient will be started on Macrobid 100 mg  twice a day until the time of her surgery. She will need cardiac clearance for her surgery and will need to stop her eloquis.

## 2015-07-29 NOTE — Anesthesia Postprocedure Evaluation (Signed)
Anesthesia Post Note  Patient: Allison Dunn  Procedure(s) Performed: Procedure(s) (LRB): LEFT LAPAROSCOPIC SIMPLE NEPHRECTOMY (Left)  Patient location during evaluation: PACU Anesthesia Type: General Level of consciousness: awake and alert Pain management: pain level controlled Vital Signs Assessment: post-procedure vital signs reviewed and stable Respiratory status: spontaneous breathing, nonlabored ventilation, respiratory function stable and patient connected to nasal cannula oxygen Cardiovascular status: blood pressure returned to baseline and stable Postop Assessment: no signs of nausea or vomiting Anesthetic complications: no    Last Vitals:  Filed Vitals:   07/29/15 1545 07/29/15 1600  BP: 130/48 128/48  Pulse: 60 60  Temp: 36.3 C 36.4 C  Resp: 22 20    Last Pain:  Filed Vitals:   07/29/15 1604  PainSc: Asleep                 Wylene Weissman L

## 2015-07-30 LAB — GLUCOSE, CAPILLARY
GLUCOSE-CAPILLARY: 104 mg/dL — AB (ref 65–99)
GLUCOSE-CAPILLARY: 91 mg/dL (ref 65–99)
GLUCOSE-CAPILLARY: 126 mg/dL — AB (ref 65–99)
GLUCOSE-CAPILLARY: 107 mg/dL — AB (ref 65–99)

## 2015-07-30 LAB — BASIC METABOLIC PANEL
Anion gap: 5 (ref 5–15)
BUN: 29 mg/dL — AB (ref 6–20)
CALCIUM: 8.6 mg/dL — AB (ref 8.9–10.3)
CO2: 28 mmol/L (ref 22–32)
CREATININE: 1.64 mg/dL — AB (ref 0.44–1.00)
Chloride: 100 mmol/L — ABNORMAL LOW (ref 101–111)
GFR calc Af Amer: 33 mL/min — ABNORMAL LOW (ref >60)
GFR, EST NON AFRICAN AMERICAN: 28 mL/min — AB (ref >60)
GLUCOSE: 124 mg/dL — AB (ref 65–99)
POTASSIUM: 4.9 mmol/L (ref 3.5–5.1)
SODIUM: 133 mmol/L — AB (ref 135–145)

## 2015-07-30 LAB — CBC
HCT: 30.1 % — ABNORMAL LOW (ref 36.0–46.0)
Hemoglobin: 10.1 g/dL — ABNORMAL LOW (ref 12.0–15.0)
MCH: 31.4 pg (ref 26.0–34.0)
MCHC: 33.6 g/dL (ref 30.0–36.0)
MCV: 93.5 fL (ref 78.0–100.0)
PLATELETS: 198 10*3/uL (ref 150–400)
RBC: 3.22 MIL/uL — AB (ref 3.87–5.11)
RDW: 16.6 % — AB (ref 11.5–15.5)
WBC: 14.9 10*3/uL — AB (ref 4.0–10.5)

## 2015-07-30 LAB — URINE CULTURE

## 2015-07-30 MED ORDER — OXYCODONE-ACETAMINOPHEN 5-325 MG PO TABS: 1.0000 | ORAL_TABLET | ORAL | Status: DC | PRN

## 2015-07-30 MED ORDER — IPRATROPIUM-ALBUTEROL 0.5-2.5 (3) MG/3ML IN SOLN: 3.0000 mL | Freq: Four times a day (QID) | RESPIRATORY_TRACT | Status: DC | PRN

## 2015-07-30 MED ORDER — CEFDINIR 300 MG PO CAPS: 300.0000 mg | ORAL_CAPSULE | Freq: Two times a day (BID) | ORAL | Status: DC

## 2015-07-30 MED ORDER — DEXTROSE 5 % IV SOLN
1.0000 g | INTRAVENOUS | Status: DC
  Administered 2015-07-30 – 2015-08-01 (×3): 1 g via INTRAVENOUS
  Filled 2015-07-30 (×4): qty 10

## 2015-07-30 MED ORDER — CEFAZOLIN SODIUM 1-5 GM-% IV SOLN
1.0000 g | Freq: Three times a day (TID) | INTRAVENOUS | Status: DC
  Filled 2015-07-30: qty 50

## 2015-07-30 MED ORDER — DEXTROSE 5 % IV SOLN: 500.0000 mg | Freq: Three times a day (TID) | INTRAVENOUS | Status: DC

## 2015-07-30 MED ORDER — CEPHALEXIN 250 MG PO CAPS: 250.0000 mg | ORAL_CAPSULE | Freq: Three times a day (TID) | ORAL | Status: DC

## 2015-07-30 MED ORDER — CEFAZOLIN SODIUM 1 G IJ SOLR
500.0000 mg | Freq: Two times a day (BID) | INTRAMUSCULAR | Status: DC
  Filled 2015-07-30: qty 5

## 2015-07-30 MED ORDER — SODIUM CHLORIDE 0.45 % IV SOLN
INTRAVENOUS | Status: DC
  Administered 2015-07-30: 100 mL via INTRAVENOUS
  Administered 2015-07-31 – 2015-08-01 (×3): via INTRAVENOUS

## 2015-07-30 MED ORDER — ACETAMINOPHEN 10 MG/ML IV SOLN
1000.0000 mg | Freq: Four times a day (QID) | INTRAVENOUS | Status: AC
  Administered 2015-07-30 – 2015-07-31 (×4): 1000 mg via INTRAVENOUS
  Filled 2015-07-30 (×4): qty 100

## 2015-07-30 MED ORDER — IPRATROPIUM-ALBUTEROL 0.5-2.5 (3) MG/3ML IN SOLN: 3.0000 mL | Freq: Four times a day (QID) | RESPIRATORY_TRACT | Status: DC

## 2015-07-30 MED ORDER — SENNOSIDES-DOCUSATE SODIUM 8.6-50 MG PO TABS: 2.0000 | ORAL_TABLET | Freq: Every day | ORAL | Status: DC

## 2015-07-30 NOTE — Clinical Social Work Note (Signed)
Clinical Social Work Assessment  Patient Details  Name: Allison Dunn MRN: [id number] Date of Birth: 1931-02-22  Date of referral:  07/30/15               Reason for consult:  Discharge Planning, Facility Placement                Permission sought to share information with:  Family Supports Permission granted to share information::  Yes, Verbal Permission Granted  Name::     Psychiatric nurse (sister)  Agency::     Relationship::  sister  Contact Information:     Housing/Transportation Living arrangements for the past 2 months:  Single Family Home Source of Information:  Patient, Other (Comment Required) (sister) Patient Interpreter Needed:  None Criminal Activity/Legal Involvement Pertinent to Current Situation/Hospitalization:  No - Comment as needed Significant Relationships:  Siblings Lives with:  Siblings Do you feel safe going back to the place where you live?  Yes Need for family participation in patient care:  Yes (Comment)  Care giving concerns:  Pt has "some dementia" per sister   Facilities manager / plan:  CSW met with pt and spoke with her sister by phone. CSW introduced self and explained the role of CSW.  Pt stated she is a widow and currently lives with her sister.  Pt does not have children.  Sister explained that [place] previously lived in [place] but moved to [facility] in December 2016 after having an "episode" where she became confused.    CSW explained that PT is recommending STR. Pt was placed at [facility] for STR in March 2017.  Pt and sister are agreeable to a SNF referral.  Sister prefers [facility] and secondly [place].  CSW will initiate the SNF referral and inform sister of bed offers once received.  CSW will continue to follow and assist with d/c planning needs.  Employment status:  Retired Nurse, adult PT Recommendations:  [place] / Referral to community resources:  [place]  Patient/Family's Response to care:  Pt and sister were appreciative of CSW assistance.  Patient/Family's Understanding of and Emotional Response to Diagnosis, Current Treatment, and Prognosis:  Pt and sister are agreeable to STR.  Sister stated she feels it will be helpful for pt to d/c to SNF before returning home.  Emotional Assessment Appearance:  Appears stated age Attitude/Demeanor/Rapport:  Other (Cooperative) Affect (typically observed):  Calm, Appropriate, Accepting Orientation:  Oriented to Self, Oriented to Place, Oriented to Situation Alcohol / Substance use:  Not Applicable Psych involvement (Current and /or in the community):  No (Comment)  Discharge Needs  Concerns to be addressed:  Discharge Planning Concerns Readmission within the last 30 days:  No Current discharge risk:  None Barriers to Discharge:  Continued Medical Work up   [place], [place], LCSW 07/30/2015, 10:48 AM

## 2015-07-30 NOTE — Evaluation (Signed)
Physical Therapy Evaluation Patient Details Name: Allison Dunn MRN: [id number] DOB: 05-07-1931 Today's Date: 07/30/2015   History of Present Illness  82 year old female s/p LEFT LAPAROSCOPIC SIMPLE NEPHRECTOMY. Hx of cognitive impairment, SSS, pacemaker HTN, A fib, DM.   Clinical Impression  Pt admitted with above diagnosis. Pt currently with functional limitations due to the deficits listed below (see PT Problem List).  Pt will benefit from skilled PT to increase their independence and safety with mobility to allow discharge to the venue listed below.   Pt's mobility limited by dizziness today however was assisted OOB to recliner.  Recommend St-SNF upon d/c.     Follow Up Recommendations SNF;Supervision/Assistance - 24 hour    Equipment Recommendations  None recommended by PT    Recommendations for Other Services       Precautions / Restrictions Precautions Precautions: Fall Precaution Comments: dizzy, monitor BP      Mobility  Bed Mobility Overal bed mobility: Needs Assistance Bed Mobility: Supine to Sit     Supine to sit: Min assist     General bed mobility comments: increased time, assist for trunk upright  Transfers Overall transfer level: Needs assistance Equipment used: Rolling walker (2 wheeled) Transfers: Sit to/from [facility] Sit to Stand: Min assist Stand pivot transfers: Min assist       General transfer comment: verbal cues for safe technique, pt dizzy upon first rise so returned to sitting, BP 114/58 mmHg, HR 90 and SpO2 100% room air, pt remained slightly dizzy however felt able to transfer with assist over to recliner  Ambulation/Gait Ambulation/Gait assistance:  (deferred for safety, dizziness)              Stairs            Wheelchair Mobility    Modified Rankin (Stroke Patients Only)       Balance                                             Pertinent Vitals/Pain Pain Assessment:  No/denies pain    Home Living Family/patient expects to be discharged to:: Private residence Living Arrangements: Other relatives (sister)   Type of Home: House Home Access: Stairs to enter     Home Layout: One level Home Equipment: Environmental consultant - 2 wheels Additional Comments: questionable accuracy, no family present    Prior Function Level of Independence: Independent with assistive device(s)               Hand Dominance        Extremity/Trunk Assessment   Upper Extremity Assessment: Generalized weakness           Lower Extremity Assessment: Generalized weakness         Communication   Communication: No difficulties  Cognition Arousal/Alertness: Awake/alert Behavior During Therapy: WFL for tasks assessed/performed Overall Cognitive Status: History of cognitive impairments - at baseline                      General Comments      Exercises        Assessment/Plan    PT Assessment Patient needs continued PT services  PT Diagnosis Difficulty walking;Generalized weakness   PT Problem List Decreased strength;Decreased activity tolerance;Decreased mobility;Decreased balance;Cardiopulmonary status limiting activity;Decreased knowledge of use of DME;Decreased safety awareness  PT Treatment Interventions DME instruction;Gait training;Functional mobility  training;Patient/family education;Therapeutic activities;Therapeutic exercise;Balance training   PT Goals (Current goals can be found in the Care Plan section) Acute Rehab PT Goals PT Goal Formulation: With patient Time For Goal Achievement: 08/13/15 Potential to Achieve Goals: Good    Frequency Min 3X/week   Barriers to discharge        Co-evaluation               End of Session Equipment Utilized During Treatment: Gait belt Activity Tolerance: Other (comment) (limited by dizziness) Patient left: in chair;with call bell/phone within reach;with chair alarm set Nurse Communication: Mobility  status         Time: 0034-9179 PT Time Calculation (min) (ACUTE ONLY): 21 min   Charges:   PT Evaluation $PT Eval Moderate Complexity: 1 Procedure     PT G Codes:        Hatcher Froning,KATHrine E 07/30/2015, 10:42 AM Carmelia Bake, PT, DPT 07/30/2015 Pager: [phone]

## 2015-07-30 NOTE — Progress Notes (Signed)
1 Day Post-Op   Subjective: Patient reports pain control adequate. Not taking much clear liquids. Some low UOP yesterday evening requiring 250 mL bolus x 2. Despite this persistently borderline UOP overnight. AF, HR stable, soft BPs this AM. Has not gotten OOB. Unsure if she has passed flatus.   Objective: Vital signs in last 24 hours: Temp:  [97.3 F (36.3 C)-99.3 F (37.4 C)] 99.3 F (37.4 C) (06/09 0425) Pulse Rate:  [59-60] 60 (06/09 0425) Resp:  [14-23] 18 (06/09 0425) BP: (91-133)/(39-64) 92/40 mmHg (06/09 0425) SpO2:  [96 %-100 %] 99 % (06/09 0425)  Intake/Output from previous day: 06/08 0701 - 06/09 0700 In: 2931.7 [I.V.:2631.7; IV Piggyback:300] Out: 27 [Urine:460; Blood:25] Intake/Output this shift:    Physical Exam:  General:alert, cooperative and appears stated age GI: soft, appropriately tender to palpation, non-distended. Incisions without erythema, induration or exudate.   Female genitalia: foley catheter in place with clear, yellow urine Resp: no increased WOB on [place] Extremities: extremities normal, atraumatic, no cyanosis or edema  Lab Results:  Recent Labs  07/29/15 1400 07/30/15 0414  HGB 10.7* 10.1*  HCT 32.1* 30.1*   BMET  Recent Labs  07/29/15 1400 07/30/15 0414  NA 134* 133*  K 4.3 4.9  CL 100* 100*  CO2 24 28  GLUCOSE 229* 124*  BUN 27* 29*  CREATININE 1.54* 1.64*  CALCIUM 8.8* 8.6*   No results for input(s): LABPT, INR in the last 72 hours. No results for input(s): LABURIN in the last 72 hours. Results for orders placed or performed during the hospital encounter of 07/26/15  Urine culture     Status: Abnormal (Preliminary result)   Collection Time: 07/26/15 10:30 AM  Result Value Ref Range Status   Specimen Description URINE, RANDOM  Final   Special Requests NONE  Final   Culture (A)  Final    >=100,000 COLONIES/mL KLEBSIELLA PNEUMONIAE SUSCEPTIBILITIES TO FOLLOW Performed at [facility]    Report Status PENDING   Incomplete    Studies/Results: Dg Chest Port 1 View  07/29/2015  CLINICAL DATA:  Back pain injury and repair. EXAM: PORTABLE CHEST 1 VIEW COMPARISON:  04/22/2015 FINDINGS: There is no focal parenchymal opacity. There is no pleural effusion or pneumothorax. The heart and mediastinal contours are unremarkable. Dual lead cardiac pacemaker The osseous structures are unremarkable. IMPRESSION: No active disease. Electronically Signed   By: Kathreen Devoid   On: 07/29/2015 14:07    Assessment/Plan: 1 Day Post-Op Procedure(s) (LRB): LEFT LAPAROSCOPIC SIMPLE NEPHRECTOMY (Left)    Advance to regular diet  Continue LR at 100 mL/hr  D/C foley catheter & TOV  Follow up UOP  OOB to chair  Ambulate in halls  Duonebs while on O2  IS & wean O2   PT/OT c/s  Social work c/s for likely SNF placement  Labs tomorrow  Likely D/C Monday, 5/12.   LOS: 1 day   Acie Fredrickson 07/30/2015, 8:41 AM

## 2015-07-30 NOTE — Progress Notes (Signed)
1 Day Post-Op   Subjective: Patient reports pain control adequate. She transferred from bed to the chair and spent a large portion of the day in the chair.  Ate half her meals. Not taking much clear liquids. Catheter was removed 2 hours ago but no void or urge to void yet. AF, VSS. Unsure if she has passed flatus. Pre-op UCx returned with >100K Klebsiella which was more resistant then prior Klebsiella infection. She was switched from ancef to ceftriaxone and will be discharged now on Omnicef. Weaned to RA. PT worked with patient today and recommends D/C to SNF.  Objective: Vital signs in last 24 hours: Temp:  [97.8 F (36.6 C)-99.3 F (37.4 C)] 99.1 F (37.3 C) (06/09 1315) Pulse Rate:  [60] 60 (06/09 1315) Resp:  [18-20] 20 (06/09 1315) BP: (92-133)/(40-49) 115/44 mmHg (06/09 1315) SpO2:  [92 %-100 %] 92 % (06/09 1315)  Intake/Output from previous day: 06/08 0701 - 06/09 0700 In: 2931.7 [I.V.:2631.7; IV Piggyback:300] Out: 30 [Urine:460; Blood:25] Intake/Output this shift: Total I/O In: 240 [P.O.:240] Out: 250 [Urine:250]  Physical Exam:  General:alert, cooperative and appears stated age GI: soft, appropriately tender to palpation, non-distended. Incisions without erythema, induration or exudate.   Female genitalia: foley catheter in place with clear, yellow urine Resp: no increased WOB on RA Extremities: extremities normal, atraumatic, no cyanosis or edema  Lab Results:  Recent Labs  07/29/15 1400 07/30/15 0414  HGB 10.7* 10.1*  HCT 32.1* 30.1*   BMET  Recent Labs  07/29/15 1400 07/30/15 0414  NA 134* 133*  K 4.3 4.9  CL 100* 100*  CO2 24 28  GLUCOSE 229* 124*  BUN 27* 29*  CREATININE 1.54* 1.64*  CALCIUM 8.8* 8.6*   No results for input(s): LABPT, INR in the last 72 hours. No results for input(s): LABURIN in the last 72 hours. Results for orders placed or performed during the hospital encounter of 07/26/15  Urine culture     Status: Abnormal   Collection Time: 07/26/15 10:30 AM  Result Value Ref Range Status   Specimen Description URINE, RANDOM  Final   Special Requests NONE  Final   Culture >=100,000 COLONIES/mL KLEBSIELLA PNEUMONIAE (A)  Final   Report Status 07/30/2015 FINAL  Final   Organism ID, Bacteria KLEBSIELLA PNEUMONIAE (A)  Final      Susceptibility   Klebsiella pneumoniae - MIC*    AMPICILLIN >=32 RESISTANT Resistant     CEFAZOLIN >=64 RESISTANT Resistant     CEFTRIAXONE <=1 SENSITIVE Sensitive     CIPROFLOXACIN 1 SENSITIVE Sensitive     GENTAMICIN <=1 SENSITIVE Sensitive     IMIPENEM <=0.25 SENSITIVE Sensitive     NITROFURANTOIN 256 RESISTANT Resistant     TRIMETH/SULFA >=320 RESISTANT Resistant     AMPICILLIN/SULBACTAM >=32 RESISTANT Resistant     PIP/TAZO 64 RESISTANT Resistant     * >=100,000 COLONIES/mL KLEBSIELLA PNEUMONIAE    Studies/Results: Dg Chest Port 1 View  07/29/2015  CLINICAL DATA:  Back pain injury and repair. EXAM: PORTABLE CHEST 1 VIEW COMPARISON:  04/22/2015 FINDINGS: There is no focal parenchymal opacity. There is no pleural effusion or pneumothorax. The heart and mediastinal contours are unremarkable. Dual lead cardiac pacemaker The osseous structures are unremarkable. IMPRESSION: No active disease. Electronically Signed   By: Kathreen Devoid   On: 07/29/2015 14:07    Assessment/Plan: 1 Day Post-Op Procedure(s) (LRB): LEFT LAPAROSCOPIC SIMPLE NEPHRECTOMY (Left)    Regular diet  Continue LR at 100 mL/hr  Ceftriaxone IV for Klebsiella UTI  Foley out. Follow up TOV  Follow up UOP  OOB to chair  Ambulate in halls  IS   PT/OT c/s  Social work c/s for likely SNF placement  Labs tomorrow  Likely D/C Monday, 5/12.   LOS: 1 day   Acie Fredrickson 07/30/2015, 4:17 PM

## 2015-07-30 NOTE — Plan of Care (Signed)
Problem: Activity: Goal: Ability to return to normal activity level will improve Outcome: Progressing Patient is generally weak. She has been encouraged to move around in bed. Patient mobilized to chair with 2 assist and walker. Will continue to monitor and encourage

## 2015-07-30 NOTE — NC FL2 (Signed)
[place] LEVEL OF CARE SCREENING TOOL     IDENTIFICATION  Patient Name: Allison Dunn Birthdate: 03-Apr-1931 Sex: female Admission Date (Current Location): 07/29/2015  [facility] and [place] Number:  Herbalist and Address:  [facility],  [place], [place]      Provider Number: [id number]  Attending Physician Name and Address:  Ardis Hughs, MD  Relative Name and Phone Number:  Wilfred Lacy [phone]    Current Level of Care: Hospital Recommended Level of Care: [place] Prior Approval Number:    Date Approved/Denied:   PASRR Number: [id number]  Discharge Plan: SNF    Current Diagnoses: Patient Active Problem List   Diagnosis Date Noted  . Chronic UTI 07/29/2015  . 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 Essential hypertension 06/07/2015  . Klebsiella sepsis ([place]) 05/02/2015  . CAP (community acquired pneumonia) 05/02/2015  . Klebsiella pneumoniae sepsis ([place]) 04/24/2015  . Transaminasemia   . Sepsis ([place]) 04/22/2015  . UTI (lower urinary tract infection) 04/22/2015  . Paroxysmal atrial fibrillation ([place]) 04/22/2015  . Delirium   . Urinary tract infectious disease   . Osteoarthritis 03/27/2015  . CVA (cerebral vascular accident) ([place]) 03/23/2015  . Moderate tricuspid regurgitation 03/11/2015  . Cardiomyopathy ([place]), EF 40-45%  02/2015 03/11/2015  . Mild mitral regurgitation 03/11/2015  . Pulmonary arterial hypertension ([place]), mild 03/11/2015  . Mild cognitive impairment 02/24/2015  . Type 2 diabetes mellitus without complication, with long-term current use of insulin ([place]) 02/24/2015  . Hypertensive heart disease with CHF (congestive heart failure) ([place]) 02/24/2015  . Essential hypertension, benign 02/21/2015  . Hyperlipidemia 02/19/2015  . Gout 02/19/2015  . Kidney stone 02/19/2015  . PAD (peripheral artery disease) ([place]) 02/19/2015  . Cardiac pacemaker in situ  02/19/2015  . CKD (chronic kidney disease) 02/19/2015    Orientation RESPIRATION BLADDER Height & Weight     Self, Time, Place  Normal Incontinent, Indwelling catheter Weight: 118 lb (53.524 kg) Height:  '5\' 1"'$  (154.9 cm)  BEHAVIORAL SYMPTOMS/MOOD NEUROLOGICAL BOWEL NUTRITION STATUS      Continent Diet (Heart Healthy/Carb Modified)  AMBULATORY STATUS COMMUNICATION OF NEEDS Skin   Extensive Assist Verbally Surgical wounds (Incision(Closed)06/08/17AbdomenOther(Comment))                       Personal Care Assistance Level of Assistance  Bathing, Dressing Bathing Assistance: Limited assistance   Dressing Assistance: Limited assistance     Functional Limitations Info             SPECIAL CARE FACTORS FREQUENCY  PT (By licensed PT), OT (By licensed OT)     PT Frequency: 5 OT Frequency: 5            Contractures      Additional Factors Info  Code Status, Allergies Code Status Info: Fullcode Allergies Info: NKDA           Current Medications (07/30/2015):  This is the current hospital active medication list Current Facility-Administered Medications  Medication Dose Route Frequency Provider Last Rate Last Dose  . 0.9 %  sodium chloride infusion  250 mL Intravenous PRN Acie Fredrickson, MD      . acetaminophen (OFIRMEV) IV 1,000 mg  1,000 mg Intravenous Q6H Acie Fredrickson, MD   1,000 mg at 07/30/15 0911  . allopurinol (ZYLOPRIM) tablet 300 mg  300 mg Oral Daily Acie Fredrickson, MD      . amiodarone (PACERONE) tablet 200 mg  200 mg Oral Daily Acie Fredrickson, MD      . amLODipine (NORVASC) tablet 5 mg  5 mg Oral Daily Ardis Hughs, MD       And  . benazepril (LOTENSIN) tablet 10 mg  10 mg Oral Daily Ardis Hughs, MD      . atorvastatin (LIPITOR) tablet 80 mg  80 mg Oral Q supper Acie Fredrickson, MD   80 mg at 07/29/15 1824  . ceFAZolin (ANCEF) 500 mg in dextrose 5 % 100 mL IVPB  500 mg Intravenous Q12H Christine E Shade, RPH      .  diphenhydrAMINE (BENADRYL) injection 12.5 mg  12.5 mg Intravenous Q6H PRN Acie Fredrickson, MD       Or  . diphenhydrAMINE (BENADRYL) 12.5 MG/5ML elixir 12.5 mg  12.5 mg Oral Q6H PRN Acie Fredrickson, MD      . donepezil (ARICEPT) tablet 10 mg  10 mg Oral QHS Acie Fredrickson, MD   10 mg at 07/29/15 2220  . insulin aspart (novoLOG) injection 0-9 Units  0-9 Units Subcutaneous TID WC Acie Fredrickson, MD   2 Units at 07/29/15 1751  . ipratropium-albuterol (DUONEB) 0.5-2.5 (3) MG/3ML nebulizer solution 3 mL  3 mL Nebulization Q6H PRN Acie Fredrickson, MD      . lactated ringers infusion   Intravenous Continuous Acie Fredrickson, MD      . morphine 2 MG/ML injection 1-2 mg  1-2 mg Intravenous Q2H PRN Acie Fredrickson, MD      . neomycin-bacitracin-polymyxin (NEOSPORIN) ointment 1 application  1 application Topical TID PRN Acie Fredrickson, MD      . ondansetron [facility]) injection 4 mg  4 mg Intravenous Q4H PRN Acie Fredrickson, MD   4 mg at 07/29/15 1635  . oxyCODONE (Oxy IR/ROXICODONE) immediate release tablet 5-10 mg  5-10 mg Oral Q4H PRN Acie Fredrickson, MD      . polyethylene glycol (MIRALAX / GLYCOLAX) packet 17 g  17 g Oral Daily PRN Acie Fredrickson, MD      . senna-docusate (Senokot-S) tablet 2 tablet  2 tablet Oral QHS Acie Fredrickson, MD   2 tablet at 07/29/15 2220  . sodium chloride flush (NS) 0.9 % injection 3 mL  3 mL Intravenous Q12H Acie Fredrickson, MD      . sodium chloride flush (NS) 0.9 % injection 3 mL  3 mL Intravenous PRN Acie Fredrickson, MD         Discharge Medications: Please see discharge summary for a list of discharge medications.  Relevant Imaging Results:  Relevant Lab Results:   Additional Information SSN: [id number]  Standley Brooking, LCSW

## 2015-07-30 NOTE — Progress Notes (Signed)
Attempted to get patient up to ambulate but patient exhibited gross dizziness while attempting to ambulate and was very unsteady on her feet.  With the assistance of the NT and a walker we were able to walk in room to the door but went back to the bed due to the patient still being dizzy and unsteady. Will continue to monitor patient.

## 2015-07-30 NOTE — Progress Notes (Signed)
Pharmacy Antibiotic Note  Allison Dunn is a 82 y.o. female admitted on 07/29/2015 with UTI and for left nephrectomy on 6.8.  Pharmacy has been consulted for antibiotic renal dosing.  Plan:  Cefazolin '500mg'$  IV q12h  Follow up renal fxn, culture results, and clinical course.   Height: '5\' 1"'$  (154.9 cm) Weight: 118 lb (53.524 kg) IBW/kg (Calculated) : 47.8  Temp (24hrs), Avg:98.2 F (36.8 C), Min:97.3 F (36.3 C), Max:99.3 F (37.4 C)   Recent Labs Lab 07/26/15 1030 07/29/15 1400 07/30/15 0414  WBC 7.3  --  14.9*  CREATININE 1.18* 1.54* 1.64*    Estimated Creatinine Clearance: 20 mL/min (by C-G formula based on Cr of 1.64).    No Known Allergies  Antimicrobials this admission: PTA Bactrim >> 6/7 6/8 cipro x1 preop 6/8 cefazolin >>   Dose adjustments this admission: 6/9 Cefazolin decreased to '500mg'$  IV q12h for renal function.  Microbiology results: 3/2 UCx: >100k klebsiella (sens: cefazolin, ctx, cipro, gent, imi, ntf, p/t, t/s) 5/1 UCx: >100k klebsiella (sens same as above) 6/5 UCx: >100k Klebsiella (sens pending)  Thank you for allowing pharmacy to be a part of this patient's care.  Gretta Arab PharmD, BCPS Pager [phone] 07/30/2015 9:37 AM

## 2015-07-30 NOTE — Clinical Social Work Placement (Signed)
Patient has a bed at [facility]. CSW has completed FL2 & will continue to follow and assist with discharge when ready.    Raynaldo Opitz, [facility] Clinical Social Worker cell #: [phone]     CLINICAL SOCIAL WORK PLACEMENT  NOTE  Date:  07/30/2015  Patient Details  Name: Allison Dunn MRN: [id number] Date of Birth: 12/29/1931  Clinical Social Work is seeking post-discharge placement for this patient at the [place] level of care (*CSW will initial, date and re-position this form in  chart as items are completed):  Yes   Patient/family provided with [facility] Work Department's list of facilities offering this level of care within the geographic area requested by the patient (or if unable, by the patient's family).  Yes   Patient/family informed of their freedom to choose among providers that offer the needed level of care, that participate in Medicare, Medicaid or managed care program needed by the patient, have an available bed and are willing to accept the patient.  Yes   Patient/family informed of [facility]'s ownership interest in [facility] and [facility], as well as of the fact that they are under no obligation to receive care at these facilities.  PASRR submitted to EDS on       PASRR number received on       Existing PASRR number confirmed on 07/30/15     FL2 transmitted to all facilities in geographic area requested by pt/family on 07/30/15     FL2 transmitted to all facilities within larger geographic area on       Patient informed that his/her managed care company has contracts with or will negotiate with certain facilities, including the following:        Yes   Patient/family informed of bed offers received.  Patient chooses bed at [facility] and [facility] recommends and patient chooses bed at      Patient to be transferred to [facility] on   .  Patient to be transferred to facility by       Patient family notified on   of transfer.  Name of family member notified:        PHYSICIAN       Additional Comment:    _______________________________________________ Standley Brooking, LCSW 07/30/2015, 12:18 PM

## 2015-07-31 ENCOUNTER — Encounter: Payer: Self-pay | Admitting: Internal Medicine

## 2015-07-31 DIAGNOSIS — Z905 Acquired absence of kidney: Secondary | ICD-10-CM | POA: Insufficient documentation

## 2015-07-31 LAB — BASIC METABOLIC PANEL
ANION GAP: 5 (ref 5–15)
BUN: 31 mg/dL — ABNORMAL HIGH (ref 6–20)
CALCIUM: 8.7 mg/dL — AB (ref 8.9–10.3)
CHLORIDE: 101 mmol/L (ref 101–111)
CO2: 26 mmol/L (ref 22–32)
CREATININE: 1.77 mg/dL — AB (ref 0.44–1.00)
GFR calc Af Amer: 30 mL/min — ABNORMAL LOW (ref >60)
GFR calc non Af Amer: 26 mL/min — ABNORMAL LOW (ref >60)
GLUCOSE: 94 mg/dL (ref 65–99)
Potassium: 4.8 mmol/L (ref 3.5–5.1)
Sodium: 132 mmol/L — ABNORMAL LOW (ref 135–145)

## 2015-07-31 LAB — GLUCOSE, CAPILLARY
GLUCOSE-CAPILLARY: 86 mg/dL (ref 65–99)
Glucose-Capillary: 105 mg/dL — ABNORMAL HIGH (ref 65–99)
Glucose-Capillary: 114 mg/dL — ABNORMAL HIGH (ref 65–99)
Glucose-Capillary: 98 mg/dL (ref 65–99)

## 2015-07-31 LAB — CBC
HCT: 30.7 % — ABNORMAL LOW (ref 36.0–46.0)
HEMOGLOBIN: 10.3 g/dL — AB (ref 12.0–15.0)
MCH: 31.1 pg (ref 26.0–34.0)
MCHC: 33.6 g/dL (ref 30.0–36.0)
MCV: 92.7 fL (ref 78.0–100.0)
Platelets: 188 10*3/uL (ref 150–400)
RBC: 3.31 MIL/uL — ABNORMAL LOW (ref 3.87–5.11)
RDW: 16.8 % — ABNORMAL HIGH (ref 11.5–15.5)
WBC: 15.7 10*3/uL — ABNORMAL HIGH (ref 4.0–10.5)

## 2015-07-31 NOTE — Progress Notes (Signed)
2 Days Post-Op   Subjective: Patient reports pain control adequate. She transferred from bed to the chair and spent a large portion of the day in the chair. Good PO intake. AF, VSS. Positive flatus.   Objective: Vital signs in last 24 hours: Temp:  [98.6 F (37 C)-98.8 F (37.1 C)] 98.8 F (37.1 C) (06/10 2058) Pulse Rate:  [59-61] 61 (06/10 2058) Resp:  [17-18] 18 (06/10 2058) BP: (108-139)/(40-50) 129/50 mmHg (06/10 2058) SpO2:  [85 %-94 %] 94 % (06/10 2058)  Intake/Output from previous day: 06/09 0701 - 06/10 0700 In: 2525 [P.O.:240; I.V.:2085; IV Piggyback:200] Out: 900 [Urine:900] Intake/Output this shift:    Physical Exam:  General:alert, cooperative and appears stated age GI: soft, appropriately tender to palpation, non-distended. Incisions without erythema, induration or exudate.   Female genitalia: foley catheter in place with clear, yellow urine Resp: no increased WOB on RA Extremities: extremities normal, atraumatic, no cyanosis or edema  Lab Results:  Recent Labs  07/29/15 1400 07/30/15 0414 07/31/15 0425  HGB 10.7* 10.1* 10.3*  HCT 32.1* 30.1* 30.7*   BMET  Recent Labs  07/30/15 0414 07/31/15 0425  NA 133* 132*  K 4.9 4.8  CL 100* 101  CO2 28 26  GLUCOSE 124* 94  BUN 29* 31*  CREATININE 1.64* 1.77*  CALCIUM 8.6* 8.7*   No results for input(s): LABPT, INR in the last 72 hours. No results for input(s): LABURIN in the last 72 hours. Results for orders placed or performed during the hospital encounter of 07/26/15  Urine culture     Status: Abnormal   Collection Time: 07/26/15 10:30 AM  Result Value Ref Range Status   Specimen Description URINE, RANDOM  Final   Special Requests NONE  Final   Culture >=100,000 COLONIES/mL KLEBSIELLA PNEUMONIAE (A)  Final   Report Status 07/30/2015 FINAL  Final   Organism ID, Bacteria KLEBSIELLA PNEUMONIAE (A)  Final      Susceptibility   Klebsiella pneumoniae - MIC*    AMPICILLIN >=32 RESISTANT Resistant    CEFAZOLIN >=64 RESISTANT Resistant     CEFTRIAXONE <=1 SENSITIVE Sensitive     CIPROFLOXACIN 1 SENSITIVE Sensitive     GENTAMICIN <=1 SENSITIVE Sensitive     IMIPENEM <=0.25 SENSITIVE Sensitive     NITROFURANTOIN 256 RESISTANT Resistant     TRIMETH/SULFA >=320 RESISTANT Resistant     AMPICILLIN/SULBACTAM >=32 RESISTANT Resistant     PIP/TAZO 64 RESISTANT Resistant     * >=100,000 COLONIES/mL KLEBSIELLA PNEUMONIAE    Studies/Results: No results found.  Assessment/Plan: 2 Days Post-Op Procedure(s) (LRB): LEFT LAPAROSCOPIC SIMPLE NEPHRECTOMY (Left)    Regular diet  Follow up UOP  OOB to chair  Ambulate in halls  IS   PT/OT c/s  Social work c/s for likely SNF placement  Labs tomorrow  Likely D/C Monday, 5/12.   LOS: 2 days   Nicolette Bang 07/31/2015, 9:00 PM

## 2015-07-31 NOTE — Evaluation (Signed)
Occupational Therapy Evaluation Patient Details Name: Allison Dunn MRN: [id number] DOB: [birth year] Today's Date: 07/31/2015    History of Present Illness 82 year old female s/p LEFT LAPAROSCOPIC SIMPLE NEPHRECTOMY. Hx of cognitive impairment, SSS, pacemaker HTN, A fib, DM.    Clinical Impression   Pt reporting some dizziness in supine today but wanted to attempt to get up to [facility]. Assisted pt to transfer to EOB but dizziness increased and pt not safe to attempt transfer OOB today. Returned pt to supine and assisted onto bedpan. Nursing made aware and present for part of session. BP supine 117/46 and sitting EOB 124/49. Will continue to attempt OOB activity as pt able. Will follow for OT to work toward increase independence with self care tasks for next venue.    Follow Up Recommendations  SNF;Supervision/Assistance - 24 hour    Equipment Recommendations  Other (comment) (TBA next venue)    Recommendations for Other Services       Precautions / Restrictions Precautions Precautions: Fall Precaution Comments: dizzy, monitor BP      Mobility Bed Mobility Overal bed mobility: Needs Assistance Bed Mobility: Supine to Sit     Supine to sit: Mod assist     General bed mobility comments: assist for trunk to upright and to scoot to EOB.   Transfers                 General transfer comment: unable to attempt due to dizziness.     Balance Overall balance assessment: Needs assistance   Sitting balance-Leahy Scale: Poor                                      ADL Overall ADL's : Needs assistance/impaired Eating/Feeding: Set up;Sitting   Grooming: Wash/dry face;Minimal assistance;Sitting   Upper Body Bathing: Maximal assistance;Sitting   Lower Body Bathing: Total assistance;Bed level   Upper Body Dressing : Moderate assistance;Bed level   Lower Body Dressing: Total assistance;Bed level                 General ADL Comments: Pt reporting  feeling dizzy in supine when OT arrived. BP taken 117/46. Attempted to sit EOB but pt reporting dizziness increased and BP 124/49. Returned to supine as pt not able to safely pivot to [facility] this visit. Nursing made aware. Assisted pt to use bedpan to void.      Vision     Perception     Praxis      Pertinent Vitals/Pain Pain Assessment: No/denies pain     Hand Dominance     Extremity/Trunk Assessment Upper Extremity Assessment Upper Extremity Assessment: Generalized weakness (note R hand edema. nursing aware.)           Communication Communication Communication: No difficulties   Cognition Arousal/Alertness: Awake/alert Behavior During Therapy: WFL for tasks assessed/performed Overall Cognitive Status: History of cognitive impairments - at baseline                     General Comments       Exercises       Shoulder Instructions      Home Living Family/patient expects to be discharged to:: Skilled nursing facility Living Arrangements: Other relatives (sister)   Type of Home: House Home Access: Stairs to enter     Home Layout: One level     Bathroom Shower/Tub: Gaffer  Home Equipment: Walker - 2 wheels   Additional Comments: questionable accuracy, no family present      Prior Functioning/Environment Level of Independence: Independent with assistive device(s)        Comments: pt states she did her own bathing, dressing and fixed her own breakfast. sister did the cooking.    OT Diagnosis: Generalized weakness   OT Problem List: Decreased strength;Decreased activity tolerance;Decreased knowledge of use of DME or AE   OT Treatment/Interventions: Self-care/ADL training;Therapeutic activities;DME and/or AE instruction;Patient/family education    OT Goals(Current goals can be found in the care plan section) Acute Rehab OT Goals Patient Stated Goal: to work with therapy to get stronger OT Goal Formulation: With patient Time For  Goal Achievement: 08/14/15 Potential to Achieve Goals: Good  OT Frequency: Min 2X/week   Barriers to D/C:            Co-evaluation              End of Session Equipment Utilized During Treatment: Oxygen  Activity Tolerance: Patient limited by fatigue;Other (comment) (dizziness) Patient left: in bed;with call bell/phone within reach;with bed alarm set;with family/visitor present   Time: 5726-2035 OT Time Calculation (min): 30 min Charges:  OT General Charges $OT Visit: 1 Procedure OT Evaluation $OT Eval Moderate Complexity: 1 Procedure OT Treatments $Therapeutic Activity: 8-22 mins G-Codes:    Jules Schick  [phone] 07/31/2015, 4:06 PM

## 2015-08-01 LAB — GLUCOSE, CAPILLARY
GLUCOSE-CAPILLARY: 52 mg/dL — AB (ref 65–99)
GLUCOSE-CAPILLARY: 75 mg/dL (ref 65–99)
GLUCOSE-CAPILLARY: 90 mg/dL (ref 65–99)
Glucose-Capillary: 91 mg/dL (ref 65–99)
Glucose-Capillary: 105 mg/dL — ABNORMAL HIGH (ref 65–99)

## 2015-08-01 LAB — BASIC METABOLIC PANEL
ANION GAP: 6 (ref 5–15)
BUN: 29 mg/dL — ABNORMAL HIGH (ref 6–20)
CALCIUM: 8.9 mg/dL (ref 8.9–10.3)
CO2: 25 mmol/L (ref 22–32)
Chloride: 102 mmol/L (ref 101–111)
Creatinine, Ser: 1.55 mg/dL — ABNORMAL HIGH (ref 0.44–1.00)
GFR calc Af Amer: 35 mL/min — ABNORMAL LOW (ref >60)
GFR, EST NON AFRICAN AMERICAN: 30 mL/min — AB (ref >60)
GLUCOSE: 94 mg/dL (ref 65–99)
Potassium: 4.3 mmol/L (ref 3.5–5.1)
SODIUM: 133 mmol/L — AB (ref 135–145)

## 2015-08-01 LAB — CBC WITH DIFFERENTIAL/PLATELET
BASOS PCT: 0 %
Basophils Absolute: 0 10*3/uL (ref 0.0–0.1)
EOS ABS: 0.1 10*3/uL (ref 0.0–0.7)
EOS PCT: 1 %
HCT: 33.2 % — ABNORMAL LOW (ref 36.0–46.0)
Hemoglobin: 11.3 g/dL — ABNORMAL LOW (ref 12.0–15.0)
LYMPHS PCT: 5 %
Lymphs Abs: 0.7 10*3/uL (ref 0.7–4.0)
MCH: 31 pg (ref 26.0–34.0)
MCHC: 34 g/dL (ref 30.0–36.0)
MCV: 91.2 fL (ref 78.0–100.0)
MONO ABS: 0.5 10*3/uL (ref 0.1–1.0)
Monocytes Relative: 3 %
Neutro Abs: 13.3 10*3/uL — ABNORMAL HIGH (ref 1.7–7.7)
Neutrophils Relative %: 91 %
PLATELETS: 190 10*3/uL (ref 150–400)
RBC: 3.64 MIL/uL — ABNORMAL LOW (ref 3.87–5.11)
RDW: 16.7 % — AB (ref 11.5–15.5)
WBC: 14.6 10*3/uL — ABNORMAL HIGH (ref 4.0–10.5)

## 2015-08-01 NOTE — Progress Notes (Signed)
3 Days Post-Op   Subjective: Patient reports pain control adequate.  Good PO intake. AF, VSS. Positive flatus.   Objective: Vital signs in last 24 hours: Temp:  [98.5 F (36.9 C)-98.8 F (37.1 C)] 98.5 F (36.9 C) (06/11 1401) Pulse Rate:  [59-65] 62 (06/11 1401) Resp:  [18] 18 (06/11 1401) BP: (129-159)/(50-60) 159/60 mmHg (06/11 1401) SpO2:  [92 %-97 %] 97 % (06/11 1323)  Intake/Output from previous day: 06/10 0701 - 06/11 0700 In: 2800 [P.O.:600; I.V.:2200] Out: 1501 [Urine:1500; Stool:1] Intake/Output this shift: Total I/O In: 240 [P.O.:240] Out: 550 [Urine:550]  Physical Exam:  General:alert, cooperative and appears stated age GI: soft, appropriately tender to palpation, non-distended. Incisions without erythema, induration or exudate.   Female genitalia: foley catheter in place with clear, yellow urine Resp: no increased WOB on RA Extremities: extremities normal, atraumatic, no cyanosis or edema  Lab Results:  Recent Labs  07/30/15 0414 07/31/15 0425 08/01/15 0926  HGB 10.1* 10.3* 11.3*  HCT 30.1* 30.7* 33.2*   BMET  Recent Labs  07/31/15 0425 08/01/15 0926  NA 132* 133*  K 4.8 4.3  CL 101 102  CO2 26 25  GLUCOSE 94 94  BUN 31* 29*  CREATININE 1.77* 1.55*  CALCIUM 8.7* 8.9   No results for input(s): LABPT, INR in the last 72 hours. No results for input(s): LABURIN in the last 72 hours. Results for orders placed or performed during the hospital encounter of 07/26/15  Urine culture     Status: Abnormal   Collection Time: 07/26/15 10:30 AM  Result Value Ref Range Status   Specimen Description URINE, RANDOM  Final   Special Requests NONE  Final   Culture >=100,000 COLONIES/mL KLEBSIELLA PNEUMONIAE (A)  Final   Report Status 07/30/2015 FINAL  Final   Organism ID, Bacteria KLEBSIELLA PNEUMONIAE (A)  Final      Susceptibility   Klebsiella pneumoniae - MIC*    AMPICILLIN >=32 RESISTANT Resistant     CEFAZOLIN >=64 RESISTANT Resistant    CEFTRIAXONE <=1 SENSITIVE Sensitive     CIPROFLOXACIN 1 SENSITIVE Sensitive     GENTAMICIN <=1 SENSITIVE Sensitive     IMIPENEM <=0.25 SENSITIVE Sensitive     NITROFURANTOIN 256 RESISTANT Resistant     TRIMETH/SULFA >=320 RESISTANT Resistant     AMPICILLIN/SULBACTAM >=32 RESISTANT Resistant     PIP/TAZO 64 RESISTANT Resistant     * >=100,000 COLONIES/mL KLEBSIELLA PNEUMONIAE    Studies/Results: No results found.  Assessment/Plan: 3 Days Post-Op Procedure(s) (LRB): LEFT LAPAROSCOPIC SIMPLE NEPHRECTOMY (Left)    Regular diet  Follow up UOP  OOB to chair  Ambulate in halls  Likely D/C Monday, 5/12.   LOS: 3 days   Nicolette Bang 08/01/2015, 4:30 PM

## 2015-08-02 ENCOUNTER — Telehealth: Payer: Self-pay | Admitting: *Deleted

## 2015-08-02 LAB — GLUCOSE, CAPILLARY
GLUCOSE-CAPILLARY: 98 mg/dL (ref 65–99)
Glucose-Capillary: 64 mg/dL — ABNORMAL LOW (ref 65–99)
Glucose-Capillary: 77 mg/dL (ref 65–99)

## 2015-08-02 MED ORDER — CEFDINIR 125 MG/5ML PO SUSR
250.0000 mg | Freq: Two times a day (BID) | ORAL | Status: DC
  Administered 2015-08-02: 250 mg via ORAL
  Filled 2015-08-02 (×2): qty 10

## 2015-08-02 NOTE — Progress Notes (Addendum)
LCSW aware of DC summary and discharge to SNF. Bed at [employer] have been faxed to [facility] to obtain authorization for SNF. (completed see below) Once auth has been obtained will send to facility and patient will be able to discharge to SNF.  Will notify patient and family once all above completed. (completed see below) Clinicals sent to [employer] through [employer] and notified of DC.   [place] has been obtained:  [phone] for three days, starting today 6-12. Rug Level: RVB She is approved for 576 minutes of therapy a week Updated clinicals need to be sent to [employer]:  [phone] All information has been communicated to facility as well. Call placed to patient sister:  Horton Finer who is agreeable with discharge and will meet patient at facility. Patient will transport by EMS. LCSW will arrange.   Lane Hacker, MSW Clinical Social Work: [employer] [phone]

## 2015-08-02 NOTE — Progress Notes (Signed)
D/c to [facility], report given to [place], Rn.

## 2015-08-02 NOTE — Discharge Instructions (Signed)
1.  Activity:  You are encouraged to ambulate frequently (about every hour during waking hours) to help prevent blood clots from forming in your legs or lungs.  However, you should not engage in any heavy lifting (> 10-15 lbs), strenuous activity, or straining. 2. Diet: You should advance your diet as instructed by your physician.  It will be normal to have some bloating, nausea, and abdominal discomfort intermittently. 3. Prescriptions:  You will be provided a prescription for pain medication to take as needed.  If your pain is not severe enough to require the prescription pain medication, you may take extra strength Tylenol instead which will have less side effects.  You should also take a prescribed stool softener to avoid straining with bowel movements as the prescription pain medication may constipate you. You have been discharged with 5 days of an antibiotic, Cefdinir or Omnicef. Please complete the full 5 day course of this.  You may restart your blood thinning medications (Eloquis) on 08/12/15. 4. Incisions: You may remove your dressing bandages 48 hours after surgery if not removed in the hospital.  You will either have some small staples or special tissue glue at each of the incision sites. Once the bandages are removed (if present), the incisions may stay open to air.  You may start showering (but not soaking or bathing in water) the 2nd day after surgery and the incisions simply need to be patted dry after the shower.  No additional care is needed. 5. What to call us about: You should call the office [phone]) if you develop fever > 101 or develop persistent vomiting.

## 2015-08-02 NOTE — Discharge Summary (Signed)
Physician Discharge Summary  Patient ID: Allison Dunn MRN: [id number] DOB/AGE: 80-26-1933 80 y.o.  Admit date: 07/29/2015 Discharge date: 08/02/2015  Admission Diagnoses: Xanthogranulomatous Pyelonephritis  Discharge Diagnoses:  Active Problems:   Chronic UTI   Xanthogranulomatous pyelonephritis   Discharged Condition: good  Hospital Course:  82 yo female who is s/p laparoscopic left simple nephrectomy and repair of intra-operative diaphragmatic injury with Dr. Louis Meckel. Immediately post-operatively she had a CXR in the PACU which confirmed for pneumothorax, pleural effusion or other acute respiratory process. She did well post-operatively. She had some low UOP for the first 24 hours post-operatively and required two 250 mL boluses the evening of POD#0.  Her foley catheter was removed the morning of POD#1 and she passed her trial of void. Her oxygen was weaned off by the morning of POD#2. Her diet was slowly advanced to a regular diet and she was passing flatus and having BMs by POD#3. She was continued on peri-operative ancef based on her prior +UCxs given her history of recurrent pyelonephritis and on POD#2 her pre-operative urine culture returned resistant to ancef and she was switched to IV ceftriaxone. PT/OT and social work were consulted on POD#1 for planned discharge to a SNF. They recommended 24 hour supervision and SNF placement.  At the time of discharge she was tolerating a regular diet, ambulating at her baseline with assistance, was voiding spontaneously after foley catheter removal with adequate UOP, and pain was well controlled with oral narcotics. She was discharged to a SNF on POD#4.  Consults: None  Significant Diagnostic Studies:  labs: Creatinine 1.55 on 6/11 Radiology: CXR, 07/29/15: No airspace abnormalities  Treatments: surgery: laparoscopic left simple nephrectomy and repair of intra-operative diaphragmatic injury   Discharge Exam: Blood pressure 131/53, pulse 61,  temperature 99.3 F (37.4 C), temperature source Oral, resp. rate 18, height '5\' 1"'$  (1.549 m), weight 53.524 kg (118 lb), SpO2 100 %. General:alert, cooperative and appears stated age GI: soft, appropriately tender to palpation, non-distended. Incisions without erythema, induration or exudate.  Female genitalia: foley catheter out Resp: no increased WOB on RA Extremities: extremities normal, atraumatic, no cyanosis or edema  Disposition: 01-Home or Self Care      Discharge Instructions    Call MD for:  difficulty breathing, headache or visual disturbances    Complete by:  As directed      Call MD for:  extreme fatigue    Complete by:  As directed      Call MD for:  hives    Complete by:  As directed      Call MD for:  persistant dizziness or light-headedness    Complete by:  As directed      Call MD for:  persistant nausea and vomiting    Complete by:  As directed      Call MD for:  redness, tenderness, or signs of infection (pain, swelling, redness, odor or green/yellow discharge around incision site)    Complete by:  As directed      Call MD for:  severe uncontrolled pain    Complete by:  As directed      Call MD for:  temperature >100.4    Complete by:  As directed      Diet - low sodium heart healthy    Complete by:  As directed      Diet Carb Modified    Complete by:  As directed      Increase activity slowly    Complete by:  As directed             Medication List    STOP taking these medications        apixaban 5 MG Tabs tablet  Commonly known as:  ELIQUIS      TAKE these medications        allopurinol 300 MG tablet  Commonly known as:  ZYLOPRIM  Take 1 tablet (300 mg total) by mouth daily.     amiodarone 200 MG tablet  Commonly known as:  PACERONE  Take 1 tablet (200 mg total) by mouth daily.     amLODipine-benazepril 5-10 MG capsule  Commonly known as:  LOTREL  Take 1 capsule by mouth daily.     atorvastatin 80 MG tablet  Commonly known as:   LIPITOR  Take 80 mg by mouth daily with supper.     cefdinir 300 MG capsule  Commonly known as:  OMNICEF  Take 1 capsule (300 mg total) by mouth 2 (two) times daily.     Cyanocobalamin 1500 MCG Tbdp  Take 1,500 mg by mouth daily with lunch.     donepezil 10 MG tablet  Commonly known as:  ARICEPT  Take 1 tablet (10 mg total) by mouth at bedtime.     glucose blood test strip  Commonly known as:  ONE TOUCH TEST STRIPS  Use as instructed to check blood sugar. DX E11.9     insulin lispro 100 UNIT/ML injection  Commonly known as:  HUMALOG  Inject 0-0.1 mLs (0-10 Units total) into the skin 3 (three) times daily before meals. Sliding scale     INSULIN SYRINGE .5CC/31GX5/16" 31G X 5/16" 0.5 ML Misc     LEVEMIR 100 UNIT/ML injection  Generic drug:  insulin detemir  Inject 6 Units into the skin at bedtime. Per sister Nelva Bush- may or may not give Levemir based on pm glucose reading.     onetouch ultrasoft lancets  Use as instructed to check blood sugar daily. DX E11.9     oxyCODONE-acetaminophen 5-325 MG tablet  Commonly known as:  ROXICET  Take 1 tablet by mouth every 4 (four) hours as needed for moderate pain or severe pain.     senna-docusate 8.6-50 MG tablet  Commonly known as:  Senokot-S  Take 2 tablets by mouth daily. Hold for diarrhea.     sulfamethoxazole-trimethoprim 400-80 MG tablet  Commonly known as:  BACTRIM,SEPTRA  Take 1 tablet by mouth every 12 (twelve) hours. Take for 28 days starting on 07/01/15.     Vitamin D-3 1000 units Caps  Take 1,000 Units by mouth daily with lunch.     WOMENS MULTI VITAMIN & MINERAL PO  Take 1 tablet by mouth daily with lunch.       Follow-up Information    Follow up with Ardis Hughs, MD On 08/12/2015.   Specialty:  Urology   Why:  1:45pm   Contact information:   [place] [address] [phone]       Follow up with [facility] .   Specialty:  Skilled Nursing Facility    Contact information:   [address] [place] [phone]      Signed: Acie Fredrickson 08/02/2015, 7:44 AM

## 2015-08-02 NOTE — Progress Notes (Signed)
4 Days Post-Op   Subjective: Patient reports pain control adequate.  Good PO intake. AF, VSS. Positive flatus and BMs. R IJ removed yesterday 2/2 leakage. Medlocked with excellent UOP.  Objective: Vital signs in last 24 hours: Temp:  [98.5 F (36.9 C)-99.6 F (37.6 C)] 99.3 F (37.4 C) (06/12 0600) Pulse Rate:  [59-65] 61 (06/12 0600) Resp:  [18] 18 (06/12 0600) BP: (131-159)/(52-60) 131/53 mmHg (06/12 0600) SpO2:  [91 %-100 %] 100 % (06/12 0600)  Intake/Output from previous day: 06/11 0701 - 06/12 0700 In: 3920 [P.O.:1320; I.V.:2600] Out: 1550 [Urine:1550] Intake/Output this shift:    Physical Exam:  General:alert, cooperative and appears stated age GI: soft, appropriately tender to palpation, non-distended. Incisions without erythema, induration or exudate.   Female genitalia: foley catheter out Resp: no increased WOB on RA Extremities: extremities normal, atraumatic, no cyanosis or edema  Lab Results:  Recent Labs  07/31/15 0425 08/01/15 0926  HGB 10.3* 11.3*  HCT 30.7* 33.2*   BMET  Recent Labs  07/31/15 0425 08/01/15 0926  NA 132* 133*  K 4.8 4.3  CL 101 102  CO2 26 25  GLUCOSE 94 94  BUN 31* 29*  CREATININE 1.77* 1.55*  CALCIUM 8.7* 8.9   No results for input(s): LABPT, INR in the last 72 hours. No results for input(s): LABURIN in the last 72 hours. Results for orders placed or performed during the hospital encounter of 07/26/15  Urine culture     Status: Abnormal   Collection Time: 07/26/15 10:30 AM  Result Value Ref Range Status   Specimen Description URINE, RANDOM  Final   Special Requests NONE  Final   Culture >=100,000 COLONIES/mL KLEBSIELLA PNEUMONIAE (A)  Final   Report Status 07/30/2015 FINAL  Final   Organism ID, Bacteria KLEBSIELLA PNEUMONIAE (A)  Final      Susceptibility   Klebsiella pneumoniae - MIC*    AMPICILLIN >=32 RESISTANT Resistant     CEFAZOLIN >=64 RESISTANT Resistant     CEFTRIAXONE <=1 SENSITIVE Sensitive    CIPROFLOXACIN 1 SENSITIVE Sensitive     GENTAMICIN <=1 SENSITIVE Sensitive     IMIPENEM <=0.25 SENSITIVE Sensitive     NITROFURANTOIN 256 RESISTANT Resistant     TRIMETH/SULFA >=320 RESISTANT Resistant     AMPICILLIN/SULBACTAM >=32 RESISTANT Resistant     PIP/TAZO 64 RESISTANT Resistant     * >=100,000 COLONIES/mL KLEBSIELLA PNEUMONIAE    Studies/Results: No results found.  Assessment/Plan: 4 Days Post-Op Procedure(s) (LRB): LEFT LAPAROSCOPIC SIMPLE NEPHRECTOMY (Left)    Regular diet  Medlock  Stop ceftriaxone   Start PO omnicef  OOB to chair  Ambulate in halls  D/C to SNF later today.   LOS: 4 days   Allison Dunn 08/02/2015, 7:31 AM

## 2015-08-02 NOTE — Telephone Encounter (Signed)
Pt was on TCM list admitted for Xanthrogranulomatous Pyelonephritis. Pt had a laparoscopic & repair of intra operative diaphragmatic injury. D/C 08/02/15 se to SNF...Allison Dunn

## 2015-08-02 NOTE — Clinical Social Work Placement (Signed)
   CLINICAL SOCIAL WORK PLACEMENT  NOTE  Date:  08/02/2015  Patient Details  Name: Allison Dunn MRN: [id number] Date of Birth: 1931/05/09  Clinical Social Work is seeking post-discharge placement for this patient at the [place] level of care (*CSW will initial, date and re-position this form in  chart as items are completed):  Yes   Patient/family provided with [place] Work Department's list of facilities offering this level of care within the geographic area requested by the patient (or if unable, by the patient's family).  Yes   Patient/family informed of their freedom to choose among providers that offer the needed level of care, that participate in Medicare, Medicaid or managed care program needed by the patient, have an available bed and are willing to accept the patient.  Yes   Patient/family informed of [facility]'s ownership interest in [facility] and [facility], as well as of the fact that they are under no obligation to receive care at these facilities.  PASRR submitted to EDS on       PASRR number received on       Existing PASRR number confirmed on 07/30/15     FL2 transmitted to all facilities in geographic area requested by pt/family on 07/30/15     FL2 transmitted to all facilities within larger geographic area on       Patient informed that his/her managed care company has contracts with or will negotiate with certain facilities, including the following:        Yes   Patient/family informed of bed offers received.  Patient chooses bed at [facility] and [place] recommends and patient chooses bed at     SNF Patient to be transferred to [facility] on  . 08/02/2015   Patient to be transferred to facility by     EMS  Patient family notified on   of transfer. [place]   Name of family member notified:      Sister  PHYSICIAN       Additional Comment:     _______________________________________________ Lilly Cove, LCSW 08/02/2015, 12:19 PM

## 2015-08-02 NOTE — Progress Notes (Addendum)
R IJ noted to be leaking, connections checked, dressing reinforced (pt noted to have been picking at it -education provided),  and IV team notified stat and Ivf rate reduced to [facility]    IV team in to d/c site.  Pt is on Rocephin Q 24 last given at 1630 today.  Not taking any other IV meds.   Progress note from MD states "likely d/c tomorrow - 6/12".  Pt is tolerating PO's.   Pt asking not to be stuck again if possible.  Will not restart pt IV at this time.

## 2015-08-02 NOTE — Progress Notes (Signed)
IVT in to d/c "RIJ" and am informed that it is a peripheral external IJ .  Site d/c'd.  Pt still declining restart and IVT states if MD decides she does need to have a new site tomorrow that IV team can come and restart it then.

## 2015-08-02 NOTE — Care Management Important Message (Signed)
Important Message  Patient Details  Name: Allison Dunn MRN: [id number] Date of Birth: [birth year]   Medicare Important Message Given:  Yes    Camillo Flaming 08/02/2015, 10:15 AMImportant Message  Patient Details  Name: Allison Dunn MRN: [id number] Date of Birth: Nov 18, 1931   Medicare Important Message Given:  Yes    Camillo Flaming 08/02/2015, 10:15 AM

## 2015-08-03 ENCOUNTER — Encounter: Payer: Self-pay | Admitting: Internal Medicine

## 2015-08-03 ENCOUNTER — Non-Acute Institutional Stay (SKILLED_NURSING_FACILITY): Payer: Medicare Other | Admitting: Internal Medicine

## 2015-08-03 DIAGNOSIS — Z794 Long term (current) use of insulin: Secondary | ICD-10-CM

## 2015-08-03 DIAGNOSIS — F0390 Unspecified dementia without behavioral disturbance: Secondary | ICD-10-CM | POA: Diagnosis not present

## 2015-08-03 DIAGNOSIS — IMO0001 Reserved for inherently not codable concepts without codable children: Secondary | ICD-10-CM

## 2015-08-03 DIAGNOSIS — D62 Acute posthemorrhagic anemia: Secondary | ICD-10-CM

## 2015-08-03 DIAGNOSIS — I110 Hypertensive heart disease with heart failure: Secondary | ICD-10-CM

## 2015-08-03 DIAGNOSIS — E785 Hyperlipidemia, unspecified: Secondary | ICD-10-CM

## 2015-08-03 DIAGNOSIS — N12 Tubulo-interstitial nephritis, not specified as acute or chronic: Secondary | ICD-10-CM | POA: Diagnosis not present

## 2015-08-03 DIAGNOSIS — B961 Klebsiella pneumoniae [K. pneumoniae] as the cause of diseases classified elsewhere: Secondary | ICD-10-CM | POA: Diagnosis not present

## 2015-08-03 DIAGNOSIS — E119 Type 2 diabetes mellitus without complications: Secondary | ICD-10-CM | POA: Diagnosis not present

## 2015-08-03 DIAGNOSIS — T8189XD Other complications of procedures, not elsewhere classified, subsequent encounter: Secondary | ICD-10-CM | POA: Diagnosis not present

## 2015-08-03 DIAGNOSIS — I429 Cardiomyopathy, unspecified: Secondary | ICD-10-CM

## 2015-08-03 DIAGNOSIS — I480 Paroxysmal atrial fibrillation: Secondary | ICD-10-CM | POA: Diagnosis not present

## 2015-08-03 DIAGNOSIS — N118 Other chronic tubulo-interstitial nephritis: Secondary | ICD-10-CM

## 2015-08-03 DIAGNOSIS — A498 Other bacterial infections of unspecified site: Secondary | ICD-10-CM

## 2015-08-03 NOTE — Progress Notes (Signed)
MRN: [id number] Name: Allison Dunn  Sex: female Age: 82 y.o. DOB: December 29, 1931  [place] #:  Facility/Room: [facility] / 102 P Level Of Care: SNF Provider: Noah Delaine. Sheppard Coil, MD Emergency Contacts: Extended Emergency Contact Information Primary Emergency Contact: [name],[name] Address: [address]          [place], [place] [address] [name] of [employer] Phone: [phone] Mobile Phone: [phone] Relation: Sister  Code Status: Full Code Allergies: Review of patient's allergies indicates no known allergies.  Chief Complaint  Patient presents with  . New Admit To SNF    Admit to Facility    HPI: Patient is 82 y.o. female with HTN,HLD,DM2, CA,pacemaker who was admitted to [facility] from 6/8-12 for a L nephrectomy 2/2 recurrent UTIs and a staghorm calculus, both of which have caused the kidney to atrophy and serve as nidus for repeated UTIs. Hospital course was complicated by intraoperative injury to diaphragm with repair and by a UTI. Pt is admitted to SNF witrh generalized weakness. While a SNF pt will be followed for AF, tx with amiodarone, eliquis has been stopped by urology, HTN, tx with lotrel and dementia tx with aricept.  Past Medical History  Diagnosis Date  . Hypertension   . Hyperlipidemia   . Diabetes mellitus without complication ([place])   . Cancer [facility])     breast, left; s/p lumpectomy/chemo/radiation  . Renal disorder   . Presence of permanent cardiac pacemaker   . Depression   . Arthritis     Past Surgical History  Procedure Laterality Date  . Kidney stone removal      x3  . Lumbar spine surgery      laminectomy and rod  . Cataract extraction    . Breast lumpectomy Left   . Eye surgery    . Laparoscopic nephrectomy Left 07/29/2015    Procedure: LEFT LAPAROSCOPIC SIMPLE NEPHRECTOMY;  Surgeon: Ardis Hughs, MD;  Location: [facility] ORS;  Service: Urology;  Laterality: Left;      Medication List       This list is accurate as of: 08/03/15 11:59 PM.   Always use your most recent med list.               allopurinol 300 MG tablet  Commonly known as:  ZYLOPRIM  Take 1 tablet (300 mg total) by mouth daily.     amiodarone 200 MG tablet  Commonly known as:  PACERONE  Take 1 tablet (200 mg total) by mouth daily.     amLODipine-benazepril 5-10 MG capsule  Commonly known as:  LOTREL  Take 1 capsule by mouth daily.     atorvastatin 80 MG tablet  Commonly known as:  LIPITOR  Take 80 mg by mouth daily with supper.     cefdinir 300 MG capsule  Commonly known as:  OMNICEF  Take 1 capsule (300 mg total) by mouth 2 (two) times daily.     Cyanocobalamin 1500 MCG Tbdp  Take 1,500 mcg by mouth daily with lunch.     D3-1000 PO  Take 1 tablet by mouth daily with lunch.     donepezil 10 MG tablet  Commonly known as:  ARICEPT  Take 1 tablet (10 mg total) by mouth at bedtime.     glucose blood test strip  Commonly known as:  ONE TOUCH TEST STRIPS  Use as instructed to check blood sugar. DX E11.9     insulin lispro 100 UNIT/ML injection  Commonly known as:  HUMALOG  Inject 0-0.1  mls (0-10 units total) into the skin 3 times daily before meals. Sliding Scale. Give 5 units if greater than 200.     INSULIN SYRINGE .5CC/31GX5/16" 31G X 5/16" 0.5 ML Misc     LEVEMIR 100 UNIT/ML injection  Generic drug:  insulin detemir  Inject 6 Units into the skin at bedtime. Per sister Nelva Bush- may or may not give Levemir based on pm glucose reading. If less than 250 do not give.     onetouch ultrasoft lancets  Use as instructed to check blood sugar daily. DX E11.9     oxyCODONE-acetaminophen 5-325 MG tablet  Commonly known as:  ROXICET  Take 1 tablet by mouth every 4 (four) hours as needed for moderate pain or severe pain.     senna-docusate 8.6-50 MG tablet  Commonly known as:  Senokot-S  Take 2 tablets by mouth daily. Hold for diarrhea.     sulfamethoxazole-trimethoprim 400-80 MG tablet  Commonly known as:  BACTRIM,SEPTRA  Take 1 tablet  by mouth every 12 (twelve) hours. Take for 28 days starting on 07/01/15.     WOMENS MULTI VITAMIN & MINERAL PO  Take 1 tablet by mouth daily with lunch.        Meds ordered this encounter  Medications  . insulin lispro (HUMALOG) 100 UNIT/ML injection    Sig: Inject 0-0.1 mls (0-10 units total) into the skin 3 times daily before meals. Sliding Scale. Give 5 units if greater than 200.  Marland Kitchen Cholecalciferol (D3-1000 PO)    Sig: Take 1 tablet by mouth daily with lunch.    Immunization History  Administered Date(s) Administered  . PPD Test 04/26/2015, 08/02/2015    Social History  Substance Use Topics  . Smoking status: Never Smoker   . Smokeless tobacco: Never Used  . Alcohol Use: No    Family history is   Family History  Problem Relation Age of Onset  . Cancer Mother     pancreatic  . Diabetes Sister   . Hypertension Sister   . Hypertension Brother       Review of Systems  DATA OBTAINED: from patient, nurse GENERAL:  no fevers, fatigue, appetite changes SKIN: No itching, rash or wounds EYES: No eye pain, redness, discharge EARS: No earache, tinnitus, change in hearing NOSE: No congestion, drainage or bleeding  MOUTH/THROAT: No mouth or tooth pain, No sore throat RESPIRATORY: No cough, wheezing, SOB CARDIAC: No chest pain, palpitations, lower extremity edema  GI: No abdominal pain, No N/V/D or constipation, No heartburn or reflux  GU: No dysuria, frequency or urgency, or incontinence  MUSCULOSKELETAL: No unrelieved bone/joint pain NEUROLOGIC: No headache, dizziness or focal weakness PSYCHIATRIC: No c/o anxiety or sadness   Filed Vitals:   08/03/15 0910  BP: 135/77  Pulse: 66  Temp: 97 F (36.1 C)  Resp: 20    SpO2 Readings from Last 1 Encounters:  08/02/15 92%        Physical Exam  GENERAL APPEARANCE: Alert, conversant,  No acute distress.  SKIN: No diaphoresis rash; incisions from anterior approach look good HEAD: Normocephalic, atraumatic  EYES:  Conjunctiva/lids clear. Pupils round, reactive. EOMs intact.  EARS: External exam WNL, canals clear. Hearing grossly normal.  NOSE: No deformity or discharge.  MOUTH/THROAT: Lips w/o lesions  RESPIRATORY: Breathing is even, unlabored. Lung sounds are clear   CARDIOVASCULAR: Heart RRR no murmurs, rubs or gallops. No peripheral edema.   GASTROINTESTINAL: Abdomen is soft, non-tender, not distended w/ normal bowel sounds. GENITOURINARY: Bladder non tender, not  distended  MUSCULOSKELETAL: No abnormal joints or musculature NEUROLOGIC:  Cranial nerves 2-12 grossly intact. Moves all extremities  PSYCHIATRIC: Mood and affect appropriate to situation, no behavioral issues  Patient Active Problem List   Diagnosis Date Noted  . Infection due to Klebsiella pneumoniae 08/14/2015  . Diaphragmatic injury as surgical complication 46/50/3546  . Postoperative anemia due to acute blood loss 08/14/2015  . Dementia without behavioral disturbance 08/14/2015  . S/p nephrectomy, right 07/31/2015  . Chronic UTI 07/29/2015  . 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 Essential hypertension 06/07/2015  . Klebsiella sepsis ([place]) 05/02/2015  . CAP (community acquired pneumonia) 05/02/2015  . Klebsiella pneumoniae sepsis ([place]) 04/24/2015  . Transaminasemia   . Sepsis ([place]) 04/22/2015  . UTI (lower urinary tract infection) 04/22/2015  . Paroxysmal atrial fibrillation ([place]) 04/22/2015  . Delirium   . Urinary tract infectious disease   . Osteoarthritis 03/27/2015  . CVA (cerebral vascular accident) ([place]) 03/23/2015  . Moderate tricuspid regurgitation 03/11/2015  . Cardiomyopathy ([place]), EF 40-45%  02/2015 03/11/2015  . Mild mitral regurgitation 03/11/2015  . Pulmonary arterial hypertension ([place]), mild 03/11/2015  . Mild cognitive impairment 02/24/2015  . Type 2 diabetes mellitus without complication, with long-term current use of insulin ([place]) 02/24/2015  . Hypertensive heart disease with CHF (congestive  heart failure) ([place]) 02/24/2015  . Essential hypertension, benign 02/21/2015  . Hyperlipidemia 02/19/2015  . Gout 02/19/2015  . Kidney stone 02/19/2015  . PAD (peripheral artery disease) ([place]) 02/19/2015  . Cardiac pacemaker in situ 02/19/2015  . CKD (chronic kidney disease) 02/19/2015       Component Value Date/Time   WBC 14.6* 08/01/2015 0926   RBC 3.64* 08/01/2015 0926   HGB 11.3* 08/01/2015 0926   HCT 33.2* 08/01/2015 0926   PLT 190 08/01/2015 0926   MCV 91.2 08/01/2015 0926   LYMPHSABS 0.7 08/01/2015 0926   MONOABS 0.5 08/01/2015 0926   EOSABS 0.1 08/01/2015 0926   BASOSABS 0.0 08/01/2015 0926        Component Value Date/Time   NA 133* 08/01/2015 0926   K 4.3 08/01/2015 0926   CL 102 08/01/2015 0926   CO2 25 08/01/2015 0926   GLUCOSE 94 08/01/2015 0926   BUN 29* 08/01/2015 0926   CREATININE 1.55* 08/01/2015 0926   CALCIUM 8.9 08/01/2015 0926   PROT 7.0 06/21/2015 1317   ALBUMIN 3.3* 06/21/2015 1317   AST 39 06/21/2015 1317   ALT 47 06/21/2015 1317   ALKPHOS 110 06/21/2015 1317   BILITOT 0.4 06/21/2015 1317   GFRNONAA 30* 08/01/2015 0926   GFRAA 35* 08/01/2015 0926    Lab Results  Component Value Date   HGBA1C 6.7* 07/26/2015    No results found for: CHOL, HDL, LDLCALC, LDLDIRECT, TRIG, CHOLHDL   No results found.  Not all labs, radiology exams or other studies done during hospitalization come through on my EPIC note; however they are reviewed by me.    Assessment and Plan  Hypertensive heart disease with CHF (congestive heart failure) (HCC) SNF - controlled with lotrel;plan - cont lotrel  Xanthogranulomatous pyelonephritis SNF - s/p L nephrectomy for same; admitted to SNF for strengthening  Infection due to Klebsiella pneumoniae SNF - peri-operative; tx with rocepine and d/c on omniceff  Diaphragmatic injury as surgical complication SNF - repaired intr-operatively; pt doing well  Postoperative anemia due to acute blood loss SNF -  11.8 to  10.1, no requiring transfusion; will f/u CBC  Cardiomyopathy ([place]), EF 40-45%  02/2015 SNF - chronic and stable;no issues  Paroxysmal  atrial fibrillation (HCC) SNF - rate controlled with amiodarone, eliquid stopped per surgeons  Type 2 diabetes mellitus without complication, with long-term current use of insulin (HCC) SNF - A1c 6.6, good controll;cont insulin regimen  Dementia without behavioral disturbance SNF - mild;cont aricept   Time spent > 45 min;> 50% of time with patient was spent reviewing records, labs, tests and studies, counseling and developing plan of care  Webb Silversmith D. Sheppard Coil, MD

## 2015-08-14 ENCOUNTER — Encounter: Payer: Self-pay | Admitting: Internal Medicine

## 2015-08-14 DIAGNOSIS — D62 Acute posthemorrhagic anemia: Secondary | ICD-10-CM | POA: Insufficient documentation

## 2015-08-14 DIAGNOSIS — B961 Klebsiella pneumoniae [K. pneumoniae] as the cause of diseases classified elsewhere: Secondary | ICD-10-CM

## 2015-08-14 DIAGNOSIS — A498 Other bacterial infections of unspecified site: Secondary | ICD-10-CM | POA: Insufficient documentation

## 2015-08-14 DIAGNOSIS — J989 Respiratory disorder, unspecified: Secondary | ICD-10-CM | POA: Insufficient documentation

## 2015-08-14 DIAGNOSIS — F0390 Unspecified dementia without behavioral disturbance: Secondary | ICD-10-CM

## 2015-08-14 HISTORY — DX: Unspecified dementia, unspecified severity, without behavioral disturbance, psychotic disturbance, mood disturbance, and anxiety: F03.90

## 2015-08-14 NOTE — Assessment & Plan Note (Addendum)
SNF - controlled with lotrel;plan - cont lotrel

## 2015-08-14 NOTE — Assessment & Plan Note (Signed)
SNF - not stated as uncontrolled ;LFT's nl;cont lipitor 80 mg

## 2015-08-14 NOTE — Assessment & Plan Note (Signed)
SNF -  11.8 to 10.1, no requiring transfusion; will f/u CBC

## 2015-08-14 NOTE — Assessment & Plan Note (Signed)
SNF - s/p L nephrectomy for same; admitted to SNF for strengthening

## 2015-08-14 NOTE — Assessment & Plan Note (Signed)
SNF - mild;cont aricept

## 2015-08-14 NOTE — Assessment & Plan Note (Signed)
SNF - peri-operative; tx with rocepine and d/c on omniceff

## 2015-08-14 NOTE — Assessment & Plan Note (Signed)
SNF - A1c 6.6, good controll;cont insulin regimen

## 2015-08-14 NOTE — Assessment & Plan Note (Signed)
SNF - repaired intr-operatively; pt doing well

## 2015-08-14 NOTE — Assessment & Plan Note (Signed)
SNF - chronic and stable;no issues

## 2015-08-14 NOTE — Assessment & Plan Note (Signed)
SNF - rate controlled with amiodarone, eliquid stopped per surgeons

## 2015-08-18 ENCOUNTER — Encounter: Payer: Self-pay | Admitting: Internal Medicine

## 2015-08-18 ENCOUNTER — Non-Acute Institutional Stay (SKILLED_NURSING_FACILITY): Payer: Medicare Other | Admitting: Internal Medicine

## 2015-08-18 DIAGNOSIS — I110 Hypertensive heart disease with heart failure: Secondary | ICD-10-CM

## 2015-08-18 DIAGNOSIS — E119 Type 2 diabetes mellitus without complications: Secondary | ICD-10-CM | POA: Diagnosis not present

## 2015-08-18 DIAGNOSIS — T8189XD Other complications of procedures, not elsewhere classified, subsequent encounter: Secondary | ICD-10-CM | POA: Diagnosis not present

## 2015-08-18 DIAGNOSIS — D62 Acute posthemorrhagic anemia: Secondary | ICD-10-CM | POA: Diagnosis not present

## 2015-08-18 DIAGNOSIS — Z794 Long term (current) use of insulin: Secondary | ICD-10-CM | POA: Diagnosis not present

## 2015-08-18 DIAGNOSIS — N118 Other chronic tubulo-interstitial nephritis: Secondary | ICD-10-CM

## 2015-08-18 DIAGNOSIS — N189 Chronic kidney disease, unspecified: Secondary | ICD-10-CM | POA: Diagnosis not present

## 2015-08-18 DIAGNOSIS — N12 Tubulo-interstitial nephritis, not specified as acute or chronic: Secondary | ICD-10-CM | POA: Diagnosis not present

## 2015-08-18 DIAGNOSIS — A498 Other bacterial infections of unspecified site: Secondary | ICD-10-CM

## 2015-08-18 DIAGNOSIS — I480 Paroxysmal atrial fibrillation: Secondary | ICD-10-CM

## 2015-08-18 DIAGNOSIS — Z905 Acquired absence of kidney: Secondary | ICD-10-CM | POA: Diagnosis not present

## 2015-08-18 DIAGNOSIS — F0390 Unspecified dementia without behavioral disturbance: Secondary | ICD-10-CM | POA: Diagnosis not present

## 2015-08-18 DIAGNOSIS — Z950 Presence of cardiac pacemaker: Secondary | ICD-10-CM | POA: Diagnosis not present

## 2015-08-18 DIAGNOSIS — IMO0001 Reserved for inherently not codable concepts without codable children: Secondary | ICD-10-CM

## 2015-08-18 DIAGNOSIS — B961 Klebsiella pneumoniae [K. pneumoniae] as the cause of diseases classified elsewhere: Secondary | ICD-10-CM | POA: Diagnosis not present

## 2015-08-18 NOTE — Progress Notes (Signed)
MRN: [id number] Name: Allison Dunn  Sex: female Age: 82 y.o. DOB: Jul 12, 1931  [place] #:  Facility/Room: [facility] / 102 P Level Of Care: SNF Provider: Noah Delaine. Sheppard Coil, MD Emergency Contacts: Extended Emergency Contact Information Primary Emergency Contact: [name],[name] Address: [address]          [place], [place] [address] [name] Phone: [phone] Mobile Phone: [phone] Relation: Sister  Code Status: Full Code  Allergies: Review of patient's allergies indicates no known allergies.  Chief Complaint  Patient presents with  . Discharge Note    Discharged from SNF    HPI: Patient is 82 y.o. female whoHTN,HLD,DM2, CA,pacemaker who was admitted to [facility] from 6/8-12 for a L nephrectomy 2/2 recurrent UTIs and a staghorm calculus, both of which have caused the kidney to atrophy and serve as nidus for repeated UTIs. Hospital course was complicated by intraoperative injury to diaphragm with repair and by a UTI. Pt is admitted to SNF witrh generalized weakness and is now ready to be d/c to home.  Past Medical History  Diagnosis Date  . Hypertension   . Hyperlipidemia   . Diabetes mellitus without complication ([place])   . Cancer [facility])     breast, left; s/p lumpectomy/chemo/radiation  . Renal disorder   . Presence of permanent cardiac pacemaker   . Depression   . Arthritis     Past Surgical History  Procedure Laterality Date  . Kidney stone removal      x3  . Lumbar spine surgery      laminectomy and rod  . Cataract extraction    . Breast lumpectomy Left   . Eye surgery    . Laparoscopic nephrectomy Left 07/29/2015    Procedure: LEFT LAPAROSCOPIC SIMPLE NEPHRECTOMY;  Surgeon: Ardis Hughs, MD;  Location: [facility] ORS;  Service: Urology;  Laterality: Left;      Medication List       This list is accurate as of: 08/18/15  4:01 PM.  Always use your most recent med list.               allopurinol 300 MG tablet  Commonly known as:   ZYLOPRIM  Take 1 tablet (300 mg total) by mouth daily.     amiodarone 200 MG tablet  Commonly known as:  PACERONE  Take 1 tablet (200 mg total) by mouth daily.     amLODipine-benazepril 5-10 MG capsule  Commonly known as:  LOTREL  Take 1 capsule by mouth daily.     atorvastatin 80 MG tablet  Commonly known as:  LIPITOR  Take 80 mg by mouth daily with supper.     Cyanocobalamin 1500 MCG Tbdp  Take 1,500 mcg by mouth daily with lunch.     D3-1000 PO  Take 1 tablet by mouth daily with lunch.     donepezil 10 MG tablet  Commonly known as:  ARICEPT  Take 1 tablet (10 mg total) by mouth at bedtime.     feeding supplement (GLUCERNA SHAKE) Liqd  Take 237 mLs by mouth 2 (two) times daily between meals.     glucose blood test strip  Commonly known as:  ONE TOUCH TEST STRIPS  Use as instructed to check blood sugar. DX E11.9     insulin lispro 100 UNIT/ML injection  Commonly known as:  HUMALOG  Inject 0-0.1 mls (0-10 units total) into the skin 3 times daily before meals. Sliding Scale. Give 5 units if greater than 200.  INSULIN SYRINGE .5CC/31GX5/16" 31G X 5/16" 0.5 ML Misc     LEVEMIR 100 UNIT/ML injection  Generic drug:  insulin detemir  Inject 6 Units into the skin at bedtime. Per sister Nelva Bush- may or may not give Levemir based on pm glucose reading. If less than 250 do not give.     onetouch ultrasoft lancets  Use as instructed to check blood sugar daily. DX E11.9     oxyCODONE-acetaminophen 5-325 MG tablet  Commonly known as:  ROXICET  Take 1 tablet by mouth every 4 (four) hours as needed for moderate pain or severe pain.     senna-docusate 8.6-50 MG tablet  Commonly known as:  Senokot-S  Take 2 tablets by mouth daily. Hold for diarrhea.     sulfamethoxazole-trimethoprim 400-80 MG tablet  Commonly known as:  BACTRIM,SEPTRA  Take 1 tablet by mouth every 12 (twelve) hours. Take for 28 days starting on 07/01/15.     WOMENS MULTI VITAMIN & MINERAL PO  Take 1  tablet by mouth daily with lunch.        Meds ordered this encounter  Medications  . feeding supplement, GLUCERNA SHAKE, (GLUCERNA SHAKE) LIQD    Sig: Take 237 mLs by mouth 2 (two) times daily between meals.    Immunization History  Administered Date(s) Administered  . PPD Test 04/26/2015, 08/02/2015    Social History  Substance Use Topics  . Smoking status: Never Smoker   . Smokeless tobacco: Never Used  . Alcohol Use: No    Filed Vitals:   08/18/15 0951  BP: 154/66  Pulse: 80  Temp: 97.7 F (36.5 C)  Resp: 18    Physical Exam  GENERAL APPEARANCE: Alert,  No acute distress.  HEENT: Unremarkable. RESPIRATORY: Breathing is even, unlabored. Lung sounds are clear   CARDIOVASCULAR: Heart RRR no murmurs, rubs or gallops. No peripheral edema.  GASTROINTESTINAL: Abdomen is soft, non-tender, not distended w/ normal bowel sounds.  NEUROLOGIC: Cranial nerves 2-12 grossly intact. Moves all extremities  Patient Active Problem List   Diagnosis Date Noted  . Infection due to Klebsiella pneumoniae 08/14/2015  . Diaphragmatic injury as surgical complication 16/11/9602  . Postoperative anemia due to acute blood loss 08/14/2015  . Dementia without behavioral disturbance 08/14/2015  . S/p nephrectomy, right 07/31/2015  . Chronic UTI 07/29/2015  . 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 Essential hypertension 06/07/2015  . Klebsiella sepsis ([place]) 05/02/2015  . CAP (community acquired pneumonia) 05/02/2015  . Klebsiella pneumoniae sepsis ([place]) 04/24/2015  . Transaminasemia   . Sepsis ([place]) 04/22/2015  . UTI (lower urinary tract infection) 04/22/2015  . Paroxysmal atrial fibrillation ([place]) 04/22/2015  . Delirium   . Urinary tract infectious disease   . Osteoarthritis 03/27/2015  . CVA (cerebral vascular accident) ([place]) 03/23/2015  . Moderate tricuspid regurgitation 03/11/2015  . Cardiomyopathy ([place]), EF 40-45%  02/2015 03/11/2015  . Mild mitral regurgitation  03/11/2015  . Pulmonary arterial hypertension ([place]), mild 03/11/2015  . Mild cognitive impairment 02/24/2015  . Type 2 diabetes mellitus without complication, with long-term current use of insulin ([place]) 02/24/2015  . Hypertensive heart disease with CHF (congestive heart failure) ([place]) 02/24/2015  . Essential hypertension, benign 02/21/2015  . Hyperlipidemia 02/19/2015  . Gout 02/19/2015  . Kidney stone 02/19/2015  . PAD (peripheral artery disease) ([place]) 02/19/2015  . Cardiac pacemaker in situ 02/19/2015  . CKD (chronic kidney disease) 02/19/2015    CBC    Component Value Date/Time   WBC 14.6* 08/01/2015 0926   RBC 3.64* 08/01/2015 5409  HGB 11.3* 08/01/2015 0926   HCT 33.2* 08/01/2015 0926   PLT 190 08/01/2015 0926   MCV 91.2 08/01/2015 0926   LYMPHSABS 0.7 08/01/2015 0926   MONOABS 0.5 08/01/2015 0926   EOSABS 0.1 08/01/2015 0926   BASOSABS 0.0 08/01/2015 0926    CMP     Component Value Date/Time   NA 133* 08/01/2015 0926   K 4.3 08/01/2015 0926   CL 102 08/01/2015 0926   CO2 25 08/01/2015 0926   GLUCOSE 94 08/01/2015 0926   BUN 29* 08/01/2015 0926   CREATININE 1.55* 08/01/2015 0926   CALCIUM 8.9 08/01/2015 0926   PROT 7.0 06/21/2015 1317   ALBUMIN 3.3* 06/21/2015 1317   AST 39 06/21/2015 1317   ALT 47 06/21/2015 1317   ALKPHOS 110 06/21/2015 1317   BILITOT 0.4 06/21/2015 1317   GFRNONAA 30* 08/01/2015 0926   GFRAA 35* 08/01/2015 0926    Assessment and Plan  Pt is d/c to home with HH/OT/PT?nursing. Medications have been reconciled and Rx's written.   Time spent > 30 min;> 50% of time with patient was spent reviewing records, labs, tests and studies, counseling and developing plan of care  Noah Delaine. Sheppard Coil, MD

## 2015-08-26 NOTE — Telephone Encounter (Signed)
Pri

## 2015-08-31 ENCOUNTER — Other Ambulatory Visit: Payer: Self-pay | Admitting: *Deleted

## 2015-08-31 MED ORDER — AMLODIPINE BESY-BENAZEPRIL HCL 5-10 MG PO CAPS: 1.0000 | ORAL_CAPSULE | Freq: Every day | ORAL | Status: DC

## 2015-08-31 MED ORDER — AMIODARONE HCL 200 MG PO TABS: 200.0000 mg | ORAL_TABLET | Freq: Every day | ORAL | Status: DC

## 2015-09-03 ENCOUNTER — Encounter: Payer: Self-pay | Admitting: Internal Medicine

## 2015-09-03 ENCOUNTER — Telehealth: Payer: Self-pay

## 2015-09-03 ENCOUNTER — Ambulatory Visit (INDEPENDENT_AMBULATORY_CARE_PROVIDER_SITE_OTHER): Payer: Medicare Other | Admitting: Internal Medicine

## 2015-09-03 VITALS — BP 140/72 | HR 74 | Temp 97.8°F | Resp 16 | Wt 121.0 lb

## 2015-09-03 DIAGNOSIS — E119 Type 2 diabetes mellitus without complications: Secondary | ICD-10-CM | POA: Diagnosis not present

## 2015-09-03 DIAGNOSIS — I10 Essential (primary) hypertension: Secondary | ICD-10-CM

## 2015-09-03 DIAGNOSIS — I480 Paroxysmal atrial fibrillation: Secondary | ICD-10-CM | POA: Diagnosis not present

## 2015-09-03 DIAGNOSIS — N12 Tubulo-interstitial nephritis, not specified as acute or chronic: Secondary | ICD-10-CM

## 2015-09-03 DIAGNOSIS — I110 Hypertensive heart disease with heart failure: Secondary | ICD-10-CM

## 2015-09-03 DIAGNOSIS — Z794 Long term (current) use of insulin: Secondary | ICD-10-CM

## 2015-09-03 DIAGNOSIS — N189 Chronic kidney disease, unspecified: Secondary | ICD-10-CM

## 2015-09-03 DIAGNOSIS — I6320 Cerebral infarction due to unspecified occlusion or stenosis of unspecified precerebral arteries: Secondary | ICD-10-CM | POA: Diagnosis not present

## 2015-09-03 DIAGNOSIS — N118 Other chronic tubulo-interstitial nephritis: Secondary | ICD-10-CM

## 2015-09-03 MED ORDER — INSULIN DETEMIR 100 UNIT/ML ~~LOC~~ SOLN: 6.0000 [IU] | Freq: Every day | SUBCUTANEOUS | Status: DC

## 2015-09-03 MED ORDER — APIXABAN 2.5 MG PO TABS: 2.5000 mg | ORAL_TABLET | Freq: Two times a day (BID) | ORAL | Status: DC

## 2015-09-03 MED ORDER — AMIODARONE HCL 200 MG PO TABS: 200.0000 mg | ORAL_TABLET | Freq: Every day | ORAL | Status: AC

## 2015-09-03 MED ORDER — ATORVASTATIN CALCIUM 80 MG PO TABS: 80.0000 mg | ORAL_TABLET | Freq: Every day | ORAL | Status: AC

## 2015-09-03 NOTE — Telephone Encounter (Signed)
PA initiated via CoverMyMeds key [id number]

## 2015-09-03 NOTE — Assessment & Plan Note (Signed)
Stable throughout June - no repeat blood work today Referred to [place] [facility] - her sister will reschedule her appt

## 2015-09-03 NOTE — Assessment & Plan Note (Signed)
Rate controlled with amiodarone On eliquis (stopped for surgery and not restarted until she left SNF) Continue current medications

## 2015-09-03 NOTE — Progress Notes (Signed)
Subjective:    Patient ID: Allison Dunn, female    DOB: 03-Oct-1931, 82 y.o.   MRN: [id number]  HPI She is here for follow up.  Since she was here last she was in the ED 06/21/15 for a UTI and she had the left laparoscopic simple nephrectomy for recurrent UTI's and large renal stone. During the operation she did have a diaphragmatic injury, which was repaired and a pneumothroax with pleural effusion. She also had a UTI.  She did well post operatively.  She was discharge to SNF the end of June.  She was discharged from SNF to home with home OT/PT.   She has been sedentary since being home.   Diabetes: She is taking her medication daily as prescribed. She is compliant with a diabetic diet. She is not exercising regularly and is very sedentary. She monitors her sugars and they have been well controlled - recently 112.   PAF, Hypertension: She is taking her medication daily. She is compliant with a low sodium diet.  She denies chest pain, palpitations,shortness of breath and regular headaches. She is not exercising regularly.    Stroke:  She was told in the hospital she had a stroke.  Her sister is not sure when it happened.  She was having stroke like symptoms the end of January and into February  - A CT scan at that time did not show a definitive stroke.  She has not had imaging since then.  Her sister says her arm has been weak for a while and she can no longer write her name.    Dementia;  She is following with neurology and has a follow up appointment in September.     Medications and allergies reviewed with patient and updated if appropriate.  Patient Active Problem List   Diagnosis Date Noted  . Infection due to Klebsiella pneumoniae 08/14/2015  . Diaphragmatic injury as surgical complication 79/89/2119  . Postoperative anemia due to acute blood loss 08/14/2015  . Dementia without behavioral disturbance 08/14/2015  . S/p nephrectomy, right 07/31/2015  . Chronic UTI 07/29/2015  .  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 Essential hypertension 06/07/2015  . Klebsiella sepsis ([place]) 05/02/2015  . CAP (community acquired pneumonia) 05/02/2015  . Klebsiella pneumoniae sepsis ([place]) 04/24/2015  . Transaminasemia   . Sepsis ([place]) 04/22/2015  . UTI (lower urinary tract infection) 04/22/2015  . Paroxysmal atrial fibrillation ([place]) 04/22/2015  . Delirium   . Urinary tract infectious disease   . Osteoarthritis 03/27/2015  . CVA (cerebral vascular accident) ([place]) 03/23/2015  . Moderate tricuspid regurgitation 03/11/2015  . Cardiomyopathy ([place]), EF 40-45%  02/2015 03/11/2015  . Mild mitral regurgitation 03/11/2015  . Pulmonary arterial hypertension ([place]), mild 03/11/2015  . Mild cognitive impairment 02/24/2015  . Type 2 diabetes mellitus without complication, with long-term current use of insulin ([place]) 02/24/2015  . Hypertensive heart disease with CHF (congestive heart failure) ([place]) 02/24/2015  . Essential hypertension, benign 02/21/2015  . Hyperlipidemia 02/19/2015  . Gout 02/19/2015  . Kidney stone 02/19/2015  . PAD (peripheral artery disease) ([place]) 02/19/2015  . Cardiac pacemaker in situ 02/19/2015  . CKD (chronic kidney disease) 02/19/2015    Current Outpatient Prescriptions on File Prior to Visit  Medication Sig Dispense Refill  . allopurinol (ZYLOPRIM) 300 MG tablet Take 1 tablet (300 mg total) by mouth daily. 90 tablet 1  . amLODipine-benazepril (LOTREL) 5-10 MG capsule Take 1 capsule by mouth daily. 90 capsule 1  . Cholecalciferol (D3-1000 PO) Take 1  tablet by mouth daily with lunch.    . Cyanocobalamin 1500 MCG TBDP Take 1,500 mcg by mouth daily with lunch.     . donepezil (ARICEPT) 10 MG tablet Take 1 tablet (10 mg total) by mouth at bedtime. 90 tablet 3  . feeding supplement, GLUCERNA SHAKE, (GLUCERNA SHAKE) LIQD Take 237 mLs by mouth 2 (two) times daily between meals.    Marland Kitchen glucose blood (ONE TOUCH TEST STRIPS) test strip Use as instructed to check  blood sugar. DX E11.9 100 each 12  . insulin lispro (HUMALOG) 100 UNIT/ML injection Inject 0-0.1 mls (0-10 units total) into the skin 3 times daily before meals. Sliding Scale. Give 5 units if greater than 200.    Marland Kitchen Insulin Syringe-Needle U-100 (INSULIN SYRINGE .5CC/31GX5/16") 31G X 5/16" 0.5 ML MISC   0  . Lancets (ONETOUCH ULTRASOFT) lancets Use as instructed to check blood sugar daily. DX E11.9 100 each 12  . Multiple Vitamins-Minerals (WOMENS MULTI VITAMIN & MINERAL PO) Take 1 tablet by mouth daily with lunch.     . senna-docusate (SENOKOT-S) 8.6-50 MG tablet Take 2 tablets by mouth daily. Hold for diarrhea. 60 tablet 1   No current facility-administered medications on file prior to visit.    Past Medical History  Diagnosis Date  . Hypertension   . Hyperlipidemia   . Diabetes mellitus without complication ([place])   . Cancer [facility])     breast, left; s/p lumpectomy/chemo/radiation  . Renal disorder   . Presence of permanent cardiac pacemaker   . Depression   . Arthritis     Past Surgical History  Procedure Laterality Date  . Kidney stone removal      x3  . Lumbar spine surgery      laminectomy and rod  . Cataract extraction    . Breast lumpectomy Left   . Eye surgery    . Laparoscopic nephrectomy Left 07/29/2015    Procedure: LEFT LAPAROSCOPIC SIMPLE NEPHRECTOMY;  Surgeon: Ardis Hughs, MD;  Location: [facility];  Service: Urology;  Laterality: Left;    Social History   Social History  . Marital Status: Widowed    Spouse Name: N/A  . Number of Children: 0  . Years of Education: N/A   Occupational History  . retired    Social History Main Topics  . Smoking status: Never Smoker   . Smokeless tobacco: Never Used  . Alcohol Use: No  . Drug Use: No  . Sexual Activity: Not on file   Other Topics Concern  . Not on file   Social History Narrative    Family History  Problem Relation Age of Onset  . Cancer Mother     pancreatic  . Diabetes Sister   . Hypertension  Sister   . Hypertension Brother     Review of Systems  Constitutional: Negative for fever, chills and appetite change.  Respiratory: Negative for cough, shortness of breath and wheezing.   Cardiovascular: Positive for palpitations (unsure if heart races) and leg swelling (legs and hands). Negative for chest pain.  Gastrointestinal: Negative for abdominal pain, diarrhea and constipation.       No gerd  Genitourinary: Negative for dysuria, frequency and hematuria.  Neurological: Positive for dizziness and light-headedness. Negative for headaches.  Psychiatric/Behavioral: Negative for sleep disturbance.       Sleeps well at night, after breakfast goes back to bed but does not sleep.  Up for lunch.  PT or OT in afternoon.  She is not motivated to do  much       Objective:   Filed Vitals:   09/03/15 0926  BP: 140/72  Pulse: 74  Temp: 97.8 F (36.6 C)  Resp: 16   Filed Weights   09/03/15 0926  Weight: 121 lb (54.885 kg)   Body mass index is 22.87 kg/(m^2).   Physical Exam Constitutional: Appears well-developed and well-nourished. No distress.  Neck: Neck supple. No tracheal deviation present. No thyromegaly present.  No carotid bruit. No cervical adenopathy.   Cardiovascular: Normal rate, regular rhythm and normal heart sounds.   2/6 systolic murmur heard.  Mld left ankle edema Pulmonary/Chest: Effort normal and breath sounds normal. No respiratory distress. No wheezes.  Psych: depressed affect      Assessment & Plan:   See Problem List for Assessment and Plan of chronic medical problems.  Follow up in 6 months sooner if needed

## 2015-09-03 NOTE — Progress Notes (Signed)
Pre visit review using our clinic review tool, if applicable. No additional management support is needed unless otherwise documented below in the visit note. 

## 2015-09-03 NOTE — Assessment & Plan Note (Signed)
She has right UE weakness.  She had symptoms concerning for a stroke in Jan-Feb, Ct scan showed chronic ischemic disease, but no obvious stroke Her sister thinks her weakness in the arm got worse in March - has not had imaging since then On atorvastatin 80 mg daily, eliquis (temporarily stopped for sugar and was not restarted at SNF- sister restarted it at home) Has neuro f/u in Sept Continue current medications Continue PT/OT

## 2015-09-03 NOTE — Assessment & Plan Note (Signed)
Lab Results  Component Value Date   HGBA1C 6.7* 07/26/2015   Sugars well controlled at home by her sister Continue insulin as prescribed - her sister does hold insulin or revise dose based on her sugar

## 2015-09-03 NOTE — Assessment & Plan Note (Signed)
Mild ankle swelling, no other evidence of fluid overload Continue current medications

## 2015-09-03 NOTE — Assessment & Plan Note (Signed)
S/p Left nephrectomy 07/2015

## 2015-09-03 NOTE — Patient Instructions (Addendum)
   Medications reviewed and updated.  No changes recommended at this time.  Your prescription(s) have been submitted to your pharmacy. Please take as directed and contact our office if you believe you are having problem(s) with the medication(s).    Please followup in 6 months   

## 2015-09-03 NOTE — Assessment & Plan Note (Signed)
BP Readings from Last 3 Encounters:  09/03/15 140/72  08/18/15 154/66  08/03/15 135/77   Adequate BP control, continue current medications Will see [facility] for further evaluation of CKD

## 2015-09-06 MED ORDER — AMLODIPINE BESYLATE 5 MG PO TABS: 5.0000 mg | ORAL_TABLET | Freq: Every day | ORAL | Status: DC

## 2015-09-06 MED ORDER — BENAZEPRIL HCL 10 MG PO TABS: 10.0000 mg | ORAL_TABLET | Freq: Every day | ORAL | Status: DC

## 2015-09-06 NOTE — Telephone Encounter (Signed)
amLODipine-benazepril (LOTREL) 5-10 MG  Patients sister came by today. Trying to see what is going on with getting this medication. She was told it needs PA.. She says can we get this done or just give her another medication. Please follow up, They are very worried about this.

## 2015-09-06 NOTE — Telephone Encounter (Signed)
Pt's sister advised in detail

## 2015-09-06 NOTE — Telephone Encounter (Signed)
Lets separate medication into two meds which should not be an issue with insurance.  New prescriptions sent to [facility].

## 2015-09-21 ENCOUNTER — Telehealth: Payer: Self-pay

## 2015-09-21 NOTE — Telephone Encounter (Signed)
FYI:: OT called and said patient had a missed visit the week of July 24. And have another missed visit the week of July 31 due to being out of town.

## 2015-09-21 NOTE — Telephone Encounter (Signed)
NOTED

## 2015-10-19 ENCOUNTER — Encounter: Payer: Self-pay | Admitting: Neurology

## 2015-10-19 ENCOUNTER — Ambulatory Visit (INDEPENDENT_AMBULATORY_CARE_PROVIDER_SITE_OTHER): Payer: Medicare Other | Admitting: Neurology

## 2015-10-19 VITALS — BP 122/62 | HR 59 | Temp 97.5°F | Ht 61.0 in | Wt 120.0 lb

## 2015-10-19 DIAGNOSIS — G3184 Mild cognitive impairment, so stated: Secondary | ICD-10-CM

## 2015-10-19 DIAGNOSIS — F0390 Unspecified dementia without behavioral disturbance: Secondary | ICD-10-CM

## 2015-10-19 DIAGNOSIS — F03A0 Unspecified dementia, mild, without behavioral disturbance, psychotic disturbance, mood disturbance, and anxiety: Secondary | ICD-10-CM

## 2015-10-19 MED ORDER — DONEPEZIL HCL 10 MG PO TABS: 10.0000 mg | ORAL_TABLET | Freq: Every day | ORAL | 3 refills | Status: AC

## 2015-10-19 NOTE — Progress Notes (Signed)
NEUROLOGY FOLLOW UP OFFICE NOTE  Allison Dunn [id number]  HISTORY OF PRESENT ILLNESS: I had the pleasure of seeing Allison Dunn in follow-up in the neurology clinic on 10/19/2015 for mild cognitive impairment. She is again accompanied by her sister who helps supplement the history today. The patient was last seen 4 months ago for memory loss. MMSE in January 2017 was 24/30. Aricept dose was increased to '10mg'$  daily on her last visit. Her sister reports she feels the patient is better, she is able to engage in conversation better. The patient reports her memory is "not too good." She is tolerating Aricept without side effects. She had a fall yesterday using her walker, her leg gave out on her. She denies any back or knee pain. She occasionally feels dizzy. She can feed and dress independently, but needs help with bathing. Her sister is in charge of medications and bill payments. She does not drive.   I personally reviewed head CT without contrast done 03/2015 which did not show any acute changes, there was mild diffuse atrophy, mild to moderate chronic microvascular disease.  HPI: This is a pleasant 82 yo RH woman with a history of hypertension, hyperlipidemia, diabetes, pacemaker placement, presenting for evaluation of memory loss. She reports that there are things she can and cannot remember. She forgets conversations but states that she can recall names and dates. She had been living by herself in [place] until November 2016 when her sister called her Thanksgiving morning and reported that she was not too coherent, answering yes and no to questions. Later that day, her brother-in-law called her to say they were having problems. They brought her back to her apartment and found it to be a mess, she did not know anything, and apparently had not paid the rent or bills for 2 months. She reports that memory changes started in October and she does not recall much of November. Her sister states that  she came to pick her up and brought her to live with her in [place] last month. Her sister states she did not seek medical attention at that time, but that they had called her doctor to straighten out her medications. Her sister is now in charge of bills and her medications. She stopped driving after November. Her sister feels she is doing better, she sometimes misplaces things, otherwise no significant concerns. No personality changes. Her sister asks about stress causing the symptoms, her lease was ending in November and she was under stress packing up to move in with her sister. There is no family history of dementia. She denies any history of head injuries, no alcohol use.   PAST MEDICAL HISTORY: Past Medical History:  Diagnosis Date  . Arthritis   . Cancer [facility])    breast, left; s/p lumpectomy/chemo/radiation  . Depression   . Diabetes mellitus without complication ([place])   . Hyperlipidemia   . Hypertension   . Presence of permanent cardiac pacemaker   . Renal disorder     MEDICATIONS: Current Outpatient Prescriptions on File Prior to Visit  Medication Sig Dispense Refill  . allopurinol (ZYLOPRIM) 300 MG tablet Take 1 tablet (300 mg total) by mouth daily. 90 tablet 1  . amiodarone (PACERONE) 200 MG tablet Take 1 tablet (200 mg total) by mouth daily. 90 tablet 1  . amLODipine (NORVASC) 5 MG tablet Take 1 tablet (5 mg total) by mouth daily. 90 tablet 3  . atorvastatin (LIPITOR) 80 MG tablet Take 1 tablet (80 mg total)  by mouth daily with supper. 90 tablet 1  . benazepril (LOTENSIN) 10 MG tablet Take 1 tablet (10 mg total) by mouth daily. 90 tablet 3  . Cholecalciferol (D3-1000 PO) Take 1 tablet by mouth daily with lunch.    . Cyanocobalamin 1500 MCG TBDP Take 1,500 mcg by mouth daily with lunch.     . donepezil (ARICEPT) 10 MG tablet Take 1 tablet (10 mg total) by mouth at bedtime. 90 tablet 3  . feeding supplement, GLUCERNA SHAKE, (GLUCERNA SHAKE) LIQD Take 237 mLs by mouth 2 (two)  times daily between meals.    Marland Kitchen glucose blood (ONE TOUCH TEST STRIPS) test strip Use as instructed to check blood sugar. DX E11.9 100 each 12  . insulin detemir (LEVEMIR) 100 UNIT/ML injection Inject 0.06 mLs (6 Units total) into the skin at bedtime. If less than 250 do not give. 10 mL 8  . insulin lispro (HUMALOG) 100 UNIT/ML injection Inject 0-0.1 mls (0-10 units total) into the skin 3 times daily before meals. Sliding Scale. Give 5 units if greater than 200.    Marland Kitchen Insulin Syringe-Needle U-100 (INSULIN SYRINGE .5CC/31GX5/16") 31G X 5/16" 0.5 ML MISC   0  . Lancets (ONETOUCH ULTRASOFT) lancets Use as instructed to check blood sugar daily. DX E11.9 100 each 12  . Multiple Vitamins-Minerals (WOMENS MULTI VITAMIN & MINERAL PO) Take 1 tablet by mouth daily with lunch.     . senna-docusate (SENOKOT-S) 8.6-50 MG tablet Take 2 tablets by mouth daily. Hold for diarrhea. 60 tablet 1  . apixaban (ELIQUIS) 2.5 MG TABS tablet Take 1 tablet (2.5 mg total) by mouth 2 (two) times daily. 90 tablet 3   No current facility-administered medications on file prior to visit.     ALLERGIES: No Known Allergies  FAMILY HISTORY: Family History  Problem Relation Age of Onset  . Cancer Mother     pancreatic  . Diabetes Sister   . Hypertension Sister   . Hypertension Brother     SOCIAL HISTORY: Social History   Social History  . Marital status: Widowed    Spouse name: N/A  . Number of children: 0  . Years of education: N/A   Occupational History  . retired    Social History Main Topics  . Smoking status: Never Smoker  . Smokeless tobacco: Never Used  . Alcohol use No  . Drug use: No  . Sexual activity: Not on file   Other Topics Concern  . Not on file   Social History Narrative  . No narrative on file    REVIEW OF SYSTEMS: Constitutional: No fevers, chills, or sweats, no generalized fatigue, change in appetite Eyes: No visual changes, double vision, eye pain Ear, nose and throat: No hearing  loss, ear pain, nasal congestion, sore throat Cardiovascular: No chest pain, palpitations Respiratory:  No shortness of breath at rest or with exertion, wheezes GastrointestinaI: No nausea, vomiting, diarrhea, abdominal pain, fecal incontinence Genitourinary:  No dysuria, urinary retention or frequency Musculoskeletal:  No neck pain, back pain Integumentary: No rash, pruritus, skin lesions Neurological: as above Psychiatric: No depression, insomnia, anxiety Endocrine: No palpitations, fatigue, diaphoresis, mood swings, change in appetite, change in weight, increased thirst Hematologic/Lymphatic:  No anemia, purpura, petechiae. Allergic/Immunologic: no itchy/runny eyes, nasal congestion, recent allergic reactions, rashes  PHYSICAL EXAM: Vitals:   10/19/15 1418  BP: 122/62  Pulse: (!) 59  Temp: 97.5 F (36.4 C)   General: No acute distress Head:  Normocephalic/atraumatic Neck: supple, no paraspinal tenderness, full range  of motion Heart:  Regular rate and rhythm Lungs:  Clear to auscultation bilaterally Back: No paraspinal tenderness Skin/Extremities: No rash, no edema Neurological Exam: alert and oriented to person, place, year, day of week. She states month is June. No aphasia or dysarthria. Fund of knowledge is appropriate.  Remote memory intact. 0/3 delayed recall.  Attention and concentration are normal.    Able to name objects and repeat phrases.  MMSE - Mini Mental State Exam 10/19/2015 02/24/2015  Orientation to time 3 4  Orientation to Place 4 4  Registration 3 3  Attention/ Calculation 4 4  Recall 0 0  Language- name 2 objects 2 2  Language- repeat 1 1  Language- follow 3 step command 3 3  Language- read & follow direction 1 1  Write a sentence 1 1  Copy design 1 1  Total score 23 24   Cranial nerves: Pupils equal, round, reactive to light. Extraocular movements intact with no nystagmus. Visual fields full. Facial sensation intact. No facial asymmetry. Tongue, uvula,  palate midline.  Motor: Bulk and tone normal, muscle strength 5/5 throughout with no pronator drift.  Sensation to light touch intact.  No extinction to double simultaneous stimulation.  Deep tendon reflexes 2+ throughout, toes downgoing.  Finger to nose testing intact.  Gait slow and cautious, unsteady with cane.   IMPRESSION: This is a pleasant 82 yo RH woman with vascular risk factors including hypertension, hyperlipidemia, diabetes, s/p pacemaker, who presented for memory loss. MMSE today 23/30 (24/30 in January 2017), indicating mild cognitive impairment, possible mild dementia Alzheimer's type. Her sister feels she is a little better. Continue Aricept to '10mg'$  daily. She denies any side effects. We again discussed the importance of control of vascular risk factors, as well as physical exercise and brain stimulation exercises for brain health. She does not drive. She will follow-up as scheduled in 6 months and knows to call for any problems.   Thank you for allowing me to participate in her care.  Please do not hesitate to call for any questions or concerns.  The duration of this appointment visit was 25 minutes of face-to-face time with the patient.  Greater than 50% of this time was spent in counseling, explanation of diagnosis, planning of further management, and coordination of care.   Ellouise Newer, M.D.   CC: Dr. Quay Burow

## 2015-10-19 NOTE — Patient Instructions (Signed)
1. Continue Aricept '10mg'$  daily 2. Control of blood pressure, cholesterol, as well as physical exercises and brain stimulation are important for brain health 3. Follow-up in 6 months

## 2015-10-20 ENCOUNTER — Encounter: Payer: Self-pay | Admitting: Neurology

## 2015-10-26 ENCOUNTER — Ambulatory Visit: Payer: Medicare Other | Admitting: Neurology

## 2015-11-01 ENCOUNTER — Ambulatory Visit: Payer: Medicare Other | Admitting: Neurology

## 2015-12-26 ENCOUNTER — Other Ambulatory Visit: Payer: Self-pay | Admitting: Internal Medicine

## 2015-12-29 NOTE — Progress Notes (Addendum)
Subjective:   Allison Dunn is a 82 y.o. female who presents for an Initial Medicare Annual Wellness Visit.  The Patient was informed that the wellness visit is to identify future health risk and educate and initiate measures that can reduce risk for increased disease through the lifespan.   Patient accompanied by sister [place].   Describes health as fair, good or great? "good"  Review of Systems    No ROS.  Medicare Wellness Visit.  Cardiac Risk Factors include: advanced age (>73mn, >>40women);diabetes mellitus;dyslipidemia;family history of premature cardiovascular disease;hypertension   Sleep patterns: Sleeps at least 8 hours. Up once void.  Home Safety/Smoke Alarms:  Smoke detectors in place Living environment; residence and Firearm Safety: Lives with sister in 1 story home. Rails in place. Firearms locked.  Seat Belt Safety/Bike Helmet: Wears seatbelt.    Counseling:   Eye Exam-last exam 03/2015, Dr. BValetta CloseDental-Last exam 04/2015, does not recall dentist name.   Female:   Pap-N/A.       Mammo and Dexa Scan-Unsure of last scans. Sister is obtaining records [place] previous [place]  [facility]-Pt report normal result, less than 10 years ago. Declines further testing.  (obtaining previous records)      Objective:    Today's Vitals   12/30/15 1411  BP: 128/60  Pulse: 63  SpO2: 96%  Weight: 116 lb 1.3 oz (52.7 kg)  Height: 5' (1.524 m)   Body mass index is 22.67 kg/m.   Current Medications (verified) Outpatient Encounter Prescriptions as of 12/30/2015  Medication Sig  . allopurinol (ZYLOPRIM) 300 MG tablet TAKE ONE TABLET BY MOUTH ONCE DAILY  . amiodarone (PACERONE) 200 MG tablet Take 1 tablet (200 mg total) by mouth daily.  .Marland KitchenamLODipine (NORVASC) 5 MG tablet Take 1 tablet (5 mg total) by mouth daily.  .Marland Kitchenatorvastatin (LIPITOR) 80 MG tablet Take 1 tablet (80 mg total) by mouth daily with supper.  . benazepril (LOTENSIN) 10 MG tablet Take 1 tablet (10  mg total) by mouth daily.  . Cholecalciferol (D3-1000 PO) Take 1 tablet by mouth daily with lunch.  . Cyanocobalamin 1500 MCG TBDP Take 1,500 mcg by mouth daily with lunch.   . donepezil (ARICEPT) 10 MG tablet Take 1 tablet (10 mg total) by mouth at bedtime.  . feeding supplement, GLUCERNA SHAKE, (GLUCERNA SHAKE) LIQD Take 237 mLs by mouth 2 (two) times daily between meals.  .Marland Kitchenglucose blood (ONE TOUCH TEST STRIPS) test strip Use as instructed to check blood sugar. DX E11.9  . insulin lispro (HUMALOG) 100 UNIT/ML injection Inject 0-0.1 mls (0-10 units total) into the skin 3 times daily before meals. Sliding Scale. Give 5 units if greater than 200.  .Marland KitchenInsulin Syringe-Needle U-100 (INSULIN SYRINGE .5CC/31GX5/16") 31G X 5/16" 0.5 ML MISC   . Lancets (ONETOUCH ULTRASOFT) lancets Use as instructed to check blood sugar daily. DX E11.9  . Multiple Vitamins-Minerals (WOMENS MULTI VITAMIN & MINERAL PO) Take 1 tablet by mouth daily with lunch.   .Marland Kitchenapixaban (ELIQUIS) 2.5 MG TABS tablet Take 1 tablet (2.5 mg total) by mouth 2 (two) times daily.  . insulin detemir (LEVEMIR) 100 UNIT/ML injection Inject 0.06 mLs (6 Units total) into the skin at bedtime. If less than 250 do not give. (Patient not taking: Reported on 12/30/2015)  . senna-docusate (SENOKOT-S) 8.6-50 MG tablet Take 2 tablets by mouth daily. Hold for diarrhea. (Patient not taking: Reported on 12/30/2015)   No facility-administered encounter medications on file as of 12/30/2015.  Allergies (verified) Patient has no known allergies.   History: Past Medical History:  Diagnosis Date  . Arthritis   . Cancer [facility])    breast, left; s/p lumpectomy/chemo/radiation  . Depression   . Diabetes mellitus without complication ([place])   . Hyperlipidemia   . Hypertension   . Presence of permanent cardiac pacemaker   . Renal disorder    Past Surgical History:  Procedure Laterality Date  . BREAST LUMPECTOMY Left   . CATARACT EXTRACTION    . EYE SURGERY     . kidney stone removal     x3  . LAPAROSCOPIC NEPHRECTOMY Left 07/29/2015   Procedure: LEFT LAPAROSCOPIC SIMPLE NEPHRECTOMY;  Surgeon: Ardis Hughs, MD;  Location: [facility]S;  Service: Urology;  Laterality: Left;  . LUMBAR SPINE SURGERY     laminectomy and rod   Family History  Problem Relation Age of Onset  . Cancer Mother     pancreatic  . Diabetes Sister   . Hypertension Sister   . Hypertension Brother    Social History   Occupational History  . retired    Social History Main Topics  . Smoking status: Never Smoker  . Smokeless tobacco: Never Used  . Alcohol use No  . Drug use: No  . Sexual activity: Not on file    Tobacco Counseling Counseling given: Not Answered   Activities of Daily Living In your present state of health, do you have any difficulty performing the following activities: 12/30/2015 07/29/2015  Hearing? N -  Vision? N -  Difficulty concentrating or making decisions? Y -  Walking or climbing stairs? N -  Dressing or bathing? N -  Doing errands, shopping? N Y  Conservation officer, nature and eating ? N -  Using the Toilet? N -  In the past six months, have you accidently leaked urine? N -  Do you have problems with loss of bowel control? N -  Managing your Medications? Y -  Managing your Finances? Y -  Housekeeping or managing your Housekeeping? Y -    Immunizations and Health Maintenance Immunization History  Administered Date(s) Administered  . PPD Test 04/26/2015, 08/02/2015   Health Maintenance Due  Topic Date Due  . FOOT EXAM  06/16/1941  . OPHTHALMOLOGY EXAM  06/16/1941  . TETANUS/TDAP  06/15/1952  . ZOSTAVAX  06/15/1993  . DEXA SCAN  06/16/1998  . PNA vac Low Risk Adult (1 of 2 - PCV13) 06/16/1998  . INFLUENZA VACCINE  09/21/2015    Patient Care Team: Binnie Rail, MD as PCP - General (Internal Medicine) Jola Schmidt, MD as Consulting Physician (Ophthalmology) Cameron Sprang, MD as Consulting Physician (Neurology) Evans Lance, MD as  Consulting Physician (Cardiology)  Indicate any recent Medical Services you may have received from other than [facility] providers in the past year (date may be approximate).     Assessment:   This is a routine wellness examination for Amar. Physical assessment deferred to PCP.   Hearing/Vision screen Hearing Screening Comments: Able to hear conversational tones w/o difficulty. No issues reported.   Vision Screening Comments: Wears corrective lenses to 20/20 vision.   Dietary issues and exercise activities discussed: Current Exercise Habits: The patient does not participate in regular exercise at present;Home exercise routine (walks throughout house, therapist walks around yard), Type of exercise: Other - see comments (with PT for balance, gait, leg strength.), Frequency (Times/Week): 2 (With physical therapist), Intensity: Mild   Diet (meal preparation, eat out, water intake, caffeinated beverages, dairy  products, fruits and vegetables): At least 3 meals/daily. Drinks water throughout day.   Breakfast: oatmeal, grits, [place] sausage, eggs, nutritional supplement Lunch: sandwich, soup, salad, baked fish Dinner: baked chicken, vegetables. Bananas, apples.   Discussed diabetic diet.  Patient has PT through [place] twice a week. Working on ambulation, balance and strength.   Goals      Patient Stated   . <enter goal here> (pt-stated)          Would like to increase daily activities to for brain stimulation.       Depression Screen PHQ 2/9 Scores 12/30/2015  PHQ - 2 Score 0    Fall Risk Fall Risk  12/30/2015 10/19/2015 06/07/2015  Falls in the past year? Yes Yes No  Number falls in past yr: 2 or more 1 -  Injury with Fall? No No -  Risk Factor Category  High Fall Risk - -  Risk for fall due to : Impaired balance/gait - -  Follow up Falls prevention discussed - -    Cognitive Function: MMSE - Mini Mental State Exam 12/30/2015 10/19/2015 02/24/2015  Orientation to time  '1 3 4  '$ Orientation to Place '3 4 4  '$ Registration '3 3 3  '$ Attention/ Calculation '3 4 4  '$ Recall 0 0 0  Language- name 2 objects '2 2 2  '$ Language- repeat '1 1 1  '$ Language- follow 3 step command '3 3 3  '$ Language- read & follow direction '1 1 1  '$ Write a sentence 0 1 1  Copy design '1 1 1  '$ Total score '18 23 24       '$ Discussed memory/safety issues. Patient is rarely (if ever) at home alone. Sister is researching local "adult day cares" for brain stimulation and activities.  Encouraged to color and complete word searches.   Screening Tests Health Maintenance  Topic Date Due  . FOOT EXAM  06/16/1941  . OPHTHALMOLOGY EXAM  06[birth year]16/1941  . TETANUS/TDAP  06/15/1952  . ZOSTAVAX  06/15/1993  . DEXA SCAN  06/16/1998  . PNA vac Low Risk Adult (1 of 2 - PCV13) 06/16/1998  . INFLUENZA VACCINE  09/21/2015  . HEMOGLOBIN A1C  01/25/2016   Flu Vaccine (high dose) administered today.  Patient states she had foot exam within the last year, examines feet daily.  Records of screenings and vaccines being obtained from previous provider. Will update immunizations at that time.    Plan:     Eat heart healthy diet (full of fruits, vegetables, whole grains, lean protein, water--limit salt, fat, and sugar intake) and increase physical activity as tolerated.  Do brain stimulating activities (puzzles, reading, adult coloring books, staying active) to keep memory sharp.   Bring a copy of your advanced directives to your next office visit.  Bring records from previous provider in [place] once obtained.   During the course of the visit, Ima was educated and counseled about the following appropriate screening and preventive services:   Vaccines to include Pneumoccal, Influenza, Hepatitis B, Td, Zostavax, HCV  Cardiovascular disease screening  Colorectal cancer screening  Bone density screening  Diabetes screening  Glaucoma screening  Mammography/PAP  Nutrition counseling  Patient Instructions  (the written plan) were given to the patient.    Gerilyn Nestle, RN   12/30/2015     Medical screening examination/treatment/procedure(s) were performed by non-physician practitioner and as supervising physician I was immediately available for consultation/collaboration. I agree with above. Binnie Rail, MD  Medical screening examination/treatment/procedure(s) were performed by  non-physician practitioner and as supervising physician I was immediately available for consultation/collaboration. I agree with above. Cathlean Cower, MD

## 2015-12-30 ENCOUNTER — Ambulatory Visit (INDEPENDENT_AMBULATORY_CARE_PROVIDER_SITE_OTHER): Payer: Medicare Other

## 2015-12-30 VITALS — BP 128/60 | HR 63 | Ht 60.0 in | Wt 116.1 lb

## 2015-12-30 DIAGNOSIS — Z23 Encounter for immunization: Secondary | ICD-10-CM

## 2015-12-30 DIAGNOSIS — Z0000 Encounter for general adult medical examination without abnormal findings: Secondary | ICD-10-CM

## 2015-12-30 NOTE — Patient Instructions (Addendum)
Eat heart healthy diet (full of fruits, vegetables, whole grains, lean protein, water--limit salt, fat, and sugar intake) and increase physical activity as tolerated.  Do brain stimulating activities (puzzles, reading, adult coloring books, staying active) to keep memory sharp.   Bring a copy of your advanced directives to your next office visit.  Bring records from previous provider in [place] once obtained.   Fall Prevention in the Home  Falls can cause injuries. They can happen to people of all ages. There are many things you can do to make your home safe and to help prevent falls.  WHAT CAN I DO ON THE OUTSIDE OF MY HOME?  Regularly fix the edges of walkways and driveways and fix any cracks.  Remove anything that might make you trip as you walk through a door, such as a raised step or threshold.  Trim any bushes or trees on the path to your home.  Use bright outdoor lighting.  Clear any walking paths of anything that might make someone trip, such as rocks or tools.  Regularly check to see if handrails are loose or broken. Make sure that both sides of any steps have handrails.  Any raised decks and porches should have guardrails on the edges.  Have any leaves, snow, or ice cleared regularly.  Use sand or salt on walking paths during winter.  Clean up any spills in your garage right away. This includes oil or grease spills. WHAT CAN I DO IN THE BATHROOM?   Use night lights.  Install grab bars by the toilet and in the tub and shower. Do not use towel bars as grab bars.  Use non-skid mats or decals in the tub or shower.  If you need to sit down in the shower, use a plastic, non-slip stool.  Keep the floor dry. Clean up any water that spills on the floor as soon as it happens.  Remove soap buildup in the tub or shower regularly.  Attach bath mats securely with double-sided non-slip rug tape.  Do not have throw rugs and other things on the floor that can make you  trip. WHAT CAN I DO IN THE BEDROOM?  Use night lights.  Make sure that you have a light by your bed that is easy to reach.  Do not use any sheets or blankets that are too big for your bed. They should not hang down onto the floor.  Have a firm chair that has side arms. You can use this for support while you get dressed.  Do not have throw rugs and other things on the floor that can make you trip. WHAT CAN I DO IN THE KITCHEN?  Clean up any spills right away.  Avoid walking on wet floors.  Keep items that you use a lot in easy-to-reach places.  If you need to reach something above you, use a strong step stool that has a grab bar.  Keep electrical cords out of the way.  Do not use floor polish or wax that makes floors slippery. If you must use wax, use non-skid floor wax.  Do not have throw rugs and other things on the floor that can make you trip. WHAT CAN I DO WITH MY STAIRS?  Do not leave any items on the stairs.  Make sure that there are handrails on both sides of the stairs and use them. Fix handrails that are broken or loose. Make sure that handrails are as long as the stairways.  Check any carpeting  to make sure that it is firmly attached to the stairs. Fix any carpet that is loose or worn.  Avoid having throw rugs at the top or bottom of the stairs. If you do have throw rugs, attach them to the floor with carpet tape.  Make sure that you have a light switch at the top of the stairs and the bottom of the stairs. If you do not have them, ask someone to add them for you. WHAT ELSE CAN I DO TO HELP PREVENT FALLS?  Wear shoes that:  Do not have high heels.  Have rubber bottoms.  Are comfortable and fit you well.  Are closed at the toe. Do not wear sandals.  If you use a stepladder:  Make sure that it is fully opened. Do not climb a closed stepladder.  Make sure that both sides of the stepladder are locked into place.  Ask someone to hold it for you, if  possible.  Clearly mark and make sure that you can see:  Any grab bars or handrails.  First and last steps.  Where the edge of each step is.  Use tools that help you move around (mobility aids) if they are needed. These include:  Canes.  Walkers.  Scooters.  Crutches.  Turn on the lights when you go into a dark area. Replace any light bulbs as soon as they burn out.  Set up your furniture so you have a clear path. Avoid moving your furniture around.  If any of your floors are uneven, fix them.  If there are any pets around you, be aware of where they are.  Review your medicines with your doctor. Some medicines can make you feel dizzy. This can increase your chance of falling. Ask your doctor what other things that you can do to help prevent falls.   This information is not intended to replace advice given to you by your health care provider. Make sure you discuss any questions you have with your health care provider.   Document Released: 12/03/2008 Document Revised: 06/23/2014 Document Reviewed: 03/13/2014 Elsevier Interactive Patient Education 2016 [place] Maintenance, Female Adopting a healthy lifestyle and getting preventive care can go a long way to promote health and wellness. Talk with your health care provider about what schedule of regular examinations is right for you. This is a good chance for you to check in with your provider about disease prevention and staying healthy. In between checkups, there are plenty of things you can do on your own. Experts have done a lot of research about which lifestyle changes and preventive measures are most likely to keep you healthy. Ask your health care provider for more information. WEIGHT AND DIET  Eat a healthy diet  Be sure to include plenty of vegetables, fruits, low-fat dairy products, and lean protein.  Do not eat a lot of foods high in solid fats, added sugars, or salt.  Get regular exercise. This is  one of the most important things you can do for your health.  Most adults should exercise for at least 150 minutes each week. The exercise should increase your heart rate and make you sweat (moderate-intensity exercise).  Most adults should also do strengthening exercises at least twice a week. This is in addition to the moderate-intensity exercise.  Maintain a healthy weight  Body mass index (BMI) is a measurement that can be used to identify possible weight problems. It estimates body fat based on height and weight. Your health  care provider can help determine your BMI and help you achieve or maintain a healthy weight.  For females 86 years of age and older:   A BMI below 18.5 is considered underweight.  A BMI of 18.5 to 24.9 is normal.  A BMI of 25 to 29.9 is considered overweight.  A BMI of 30 and above is considered obese.  Watch levels of cholesterol and blood lipids  You should start having your blood tested for lipids and cholesterol at 80 years of age, then have this test every 5 years.  You may need to have your cholesterol levels checked more often if:  Your lipid or cholesterol levels are high.  You are older than 80 years of age.  You are at high risk for heart disease.  CANCER SCREENING   Lung Cancer  Lung cancer screening is recommended for adults 80-30 years old who are at high risk for lung cancer because of a history of smoking.  A yearly low-dose CT scan of the lungs is recommended for people who:  Currently smoke.  Have quit within the past 15 years.  Have at least a 30-pack-year history of smoking. A pack year is smoking an average of one pack of cigarettes a day for 1 year.  Yearly screening should continue until it has been 15 years since you quit.  Yearly screening should stop if you develop a health problem that would prevent you from having lung cancer treatment.  Breast Cancer  Practice breast self-awareness. This means understanding  how your breasts normally appear and feel.  It also means doing regular breast self-exams. Let your health care provider know about any changes, no matter how small.  If you are in your 20s or 30s, you should have a clinical breast exam (CBE) by a health care provider every 1-3 years as part of a regular health exam.  If you are 63 or older, have a CBE every year. Also consider having a breast X-ray (mammogram) every year.  If you have a family history of breast cancer, talk to your health care provider about genetic screening.  If you are at high risk for breast cancer, talk to your health care provider about having an MRI and a mammogram every year.  Breast cancer gene (BRCA) assessment is recommended for women who have family members with BRCA-related cancers. BRCA-related cancers include:  Breast.  Ovarian.  Tubal.  Peritoneal cancers.  Results of the assessment will determine the need for genetic counseling and BRCA1 and BRCA2 testing. Cervical Cancer Your health care provider may recommend that you be screened regularly for cancer of the pelvic organs (ovaries, uterus, and vagina). This screening involves a pelvic examination, including checking for microscopic changes to the surface of your cervix (Pap test). You may be encouraged to have this screening done every 3 years, beginning at age 44.  For women ages 88-65, health care providers may recommend pelvic exams and Pap testing every 3 years, or they may recommend the Pap and pelvic exam, combined with testing for human papilloma virus (HPV), every 5 years. Some types of HPV increase your risk of cervical cancer. Testing for HPV may also be done on women of any age with unclear Pap test results.  Other health care providers may not recommend any screening for nonpregnant women who are considered low risk for pelvic cancer and who do not have symptoms. Ask your health care provider if a screening pelvic exam is right for  you.  If  you have had past treatment for cervical cancer or a condition that could lead to cancer, you need Pap tests and screening for cancer for at least 20 years after your treatment. If Pap tests have been discontinued, your risk factors (such as having a new sexual partner) need to be reassessed to determine if screening should resume. Some women have medical problems that increase the chance of getting cervical cancer. In these cases, your health care provider may recommend more frequent screening and Pap tests. Colorectal Cancer  This type of cancer can be detected and often prevented.  Routine colorectal cancer screening usually begins at 80 years of age and continues through 80 years of age.  Your health care provider may recommend screening at an earlier age if you have risk factors for colon cancer.  Your health care provider may also recommend using home test kits to check for hidden blood in the stool.  A small camera at the end of a tube can be used to examine your colon directly (sigmoidoscopy or colonoscopy). This is done to check for the earliest forms of colorectal cancer.  Routine screening usually begins at age 5.  Direct examination of the colon should be repeated every 5-10 years through 80 years of age. However, you may need to be screened more often if early forms of precancerous polyps or small growths are found. Skin Cancer  Check your skin from head to toe regularly.  Tell your health care provider about any new moles or changes in moles, especially if there is a change in a mole's shape or color.  Also tell your health care provider if you have a mole that is larger than the size of a pencil eraser.  Always use sunscreen. Apply sunscreen liberally and repeatedly throughout the day.  Protect yourself by wearing long sleeves, pants, a wide-brimmed hat, and sunglasses whenever you are outside. HEART DISEASE, DIABETES, AND HIGH BLOOD PRESSURE   High blood  pressure causes heart disease and increases the risk of stroke. High blood pressure is more likely to develop in:  People who have blood pressure in the high end of the normal range (130-139/85-89 mm Hg).  People who are overweight or obese.  People who are African American.  If you are 76-7 years of age, have your blood pressure checked every 3-5 years. If you are 13 years of age or older, have your blood pressure checked every year. You should have your blood pressure measured twice--once when you are at a hospital or clinic, and once when you are not at a hospital or clinic. Record the average of the two measurements. To check your blood pressure when you are not at a hospital or clinic, you can use:  An automated blood pressure machine at a pharmacy.  A home blood pressure monitor.  If you are between 24 years and 59 years old, ask your health care provider if you should take aspirin to prevent strokes.  Have regular diabetes screenings. This involves taking a blood sample to check your fasting blood sugar level.  If you are at a normal weight and have a low risk for diabetes, have this test once every three years after 80 years of age.  If you are overweight and have a high risk for diabetes, consider being tested at a younger age or more often. PREVENTING INFECTION  Hepatitis B  If you have a higher risk for hepatitis B, you should be screened for this virus. You are considered at  high risk for hepatitis B if:  You were born in a country where hepatitis B is common. Ask your health care provider which countries are considered high risk.  Your parents were born in a high-risk country, and you have not been immunized against hepatitis B (hepatitis B vaccine).  You have HIV or AIDS.  You use needles to inject street drugs.  You live with someone who has hepatitis B.  You have had sex with someone who has hepatitis B.  You get hemodialysis treatment.  You take certain  medicines for conditions, including cancer, organ transplantation, and autoimmune conditions. Hepatitis C  Blood testing is recommended for:  Everyone born from [birth year] through 1963.  Anyone with known risk factors for hepatitis C. Sexually transmitted infections (STIs)  You should be screened for sexually transmitted infections (STIs) including gonorrhea and chlamydia if:  You are sexually active and are younger than 80 years of age.  You are older than 80 years of age and your health care provider tells you that you are at risk for this type of infection.  Your sexual activity has changed since you were last screened and you are at an increased risk for chlamydia or gonorrhea. Ask your health care provider if you are at risk.  If you do not have HIV, but are at risk, it may be recommended that you take a prescription medicine daily to prevent HIV infection. This is called pre-exposure prophylaxis (PrEP). You are considered at risk if:  You are sexually active and do not regularly use condoms or know the HIV status of your partner(s).  You take drugs by injection.  You are sexually active with a partner who has HIV. Talk with your health care provider about whether you are at high risk of being infected with HIV. If you choose to begin PrEP, you should first be tested for HIV. You should then be tested every 3 months for as long as you are taking PrEP.  PREGNANCY   If you are premenopausal and you may become pregnant, ask your health care provider about preconception counseling.  If you may become pregnant, take 400 to 800 micrograms (mcg) of folic acid every day.  If you want to prevent pregnancy, talk to your health care provider about birth control (contraception). OSTEOPOROSIS AND MENOPAUSE   Osteoporosis is a disease in which the bones lose minerals and strength with aging. This can result in serious bone fractures. Your risk for osteoporosis can be identified using a bone  density scan.  If you are 49 years of age or older, or if you are at risk for osteoporosis and fractures, ask your health care provider if you should be screened.  Ask your health care provider whether you should take a calcium or vitamin D supplement to lower your risk for osteoporosis.  Menopause may have certain physical symptoms and risks.  Hormone replacement therapy may reduce some of these symptoms and risks. Talk to your health care provider about whether hormone replacement therapy is right for you.  HOME CARE INSTRUCTIONS   Schedule regular health, dental, and eye exams.  Stay current with your immunizations.   Do not use any tobacco products including cigarettes, chewing tobacco, or electronic cigarettes.  If you are pregnant, do not drink alcohol.  If you are breastfeeding, limit how much and how often you drink alcohol.  Limit alcohol intake to no more than 1 drink per day for nonpregnant women. One drink equals 12 ounces of beer,  5 ounces of wine, or 1 ounces of hard liquor.  Do not use street drugs.  Do not share needles.  Ask your health care provider for help if you need support or information about quitting drugs.  Tell your health care provider if you often feel depressed.  Tell your health care provider if you have ever been abused or do not feel safe at home.   This information is not intended to replace advice given to you by your health care provider. Make sure you discuss any questions you have with your health care provider.   Document Released: 08/22/2010 Document Revised: 02/27/2014 Document Reviewed: 01/08/2013 Elsevier Interactive Patient Education [employer].

## 2016-01-05 ENCOUNTER — Telehealth: Payer: Self-pay | Admitting: Internal Medicine

## 2016-01-05 NOTE — Telephone Encounter (Signed)
Spoke with pts sister. Pharmacy stated that she will have not be able to fill test strips until 11/18. Insulin is given to the pt based on her blood sugar. Pts sister is going to come by office and pick up sample strips until she is able to get a refill.

## 2016-01-05 NOTE — Telephone Encounter (Signed)
Allison Dunn walked in this morning to advxsiee that the pharmacy will not allow her to refill glucose blood (ONE TOUCH TEST STRIPS) test strip [[id number]] . States that it is not time yet. She thinks they told her that she cannot refill until December, but it does appear that the next refill would be due today. Please call them to clarify the confusion. The patient is completely out at this point.  Please call Allison Dunn to advise asap

## 2016-01-18 ENCOUNTER — Ambulatory Visit: Payer: Medicare Other | Admitting: Neurology

## 2016-02-19 ENCOUNTER — Encounter (HOSPITAL_COMMUNITY): Payer: Self-pay | Admitting: *Deleted

## 2016-02-19 ENCOUNTER — Observation Stay (HOSPITAL_COMMUNITY): Payer: Medicare Other

## 2016-02-19 ENCOUNTER — Observation Stay (HOSPITAL_COMMUNITY)
Admission: EM | Admit: 2016-02-19 | Discharge: 2016-02-23 | Disposition: A | Discharge status: Skilled Nursing Facility | Payer: Medicare Other | Attending: Internal Medicine | Admitting: Internal Medicine

## 2016-02-19 ENCOUNTER — Emergency Department (HOSPITAL_COMMUNITY): Payer: Medicare Other

## 2016-02-19 DIAGNOSIS — Z794 Long term (current) use of insulin: Secondary | ICD-10-CM | POA: Insufficient documentation

## 2016-02-19 DIAGNOSIS — E1122 Type 2 diabetes mellitus with diabetic chronic kidney disease: Secondary | ICD-10-CM | POA: Insufficient documentation

## 2016-02-19 DIAGNOSIS — I480 Paroxysmal atrial fibrillation: Secondary | ICD-10-CM | POA: Diagnosis not present

## 2016-02-19 DIAGNOSIS — I6330 Cerebral infarction due to thrombosis of unspecified cerebral artery: Secondary | ICD-10-CM

## 2016-02-19 DIAGNOSIS — M25511 Pain in right shoulder: Secondary | ICD-10-CM | POA: Diagnosis not present

## 2016-02-19 DIAGNOSIS — M1990 Unspecified osteoarthritis, unspecified site: Secondary | ICD-10-CM | POA: Diagnosis not present

## 2016-02-19 DIAGNOSIS — N183 Chronic kidney disease, stage 3 (moderate): Secondary | ICD-10-CM | POA: Diagnosis not present

## 2016-02-19 DIAGNOSIS — I6523 Occlusion and stenosis of bilateral carotid arteries: Secondary | ICD-10-CM | POA: Diagnosis not present

## 2016-02-19 DIAGNOSIS — I672 Cerebral atherosclerosis: Secondary | ICD-10-CM | POA: Insufficient documentation

## 2016-02-19 DIAGNOSIS — R531 Weakness: Principal | ICD-10-CM | POA: Insufficient documentation

## 2016-02-19 DIAGNOSIS — I6320 Cerebral infarction due to unspecified occlusion or stenosis of unspecified precerebral arteries: Secondary | ICD-10-CM

## 2016-02-19 DIAGNOSIS — E1121 Type 2 diabetes mellitus with diabetic nephropathy: Secondary | ICD-10-CM | POA: Insufficient documentation

## 2016-02-19 DIAGNOSIS — I639 Cerebral infarction, unspecified: Secondary | ICD-10-CM

## 2016-02-19 DIAGNOSIS — N189 Chronic kidney disease, unspecified: Secondary | ICD-10-CM | POA: Diagnosis not present

## 2016-02-19 DIAGNOSIS — I5022 Chronic systolic (congestive) heart failure: Secondary | ICD-10-CM | POA: Insufficient documentation

## 2016-02-19 DIAGNOSIS — F329 Major depressive disorder, single episode, unspecified: Secondary | ICD-10-CM | POA: Diagnosis not present

## 2016-02-19 DIAGNOSIS — M4802 Spinal stenosis, cervical region: Secondary | ICD-10-CM | POA: Insufficient documentation

## 2016-02-19 DIAGNOSIS — G9389 Other specified disorders of brain: Secondary | ICD-10-CM | POA: Diagnosis not present

## 2016-02-19 DIAGNOSIS — Z853 Personal history of malignant neoplasm of breast: Secondary | ICD-10-CM | POA: Insufficient documentation

## 2016-02-19 DIAGNOSIS — R296 Repeated falls: Secondary | ICD-10-CM | POA: Diagnosis not present

## 2016-02-19 DIAGNOSIS — I130 Hypertensive heart and chronic kidney disease with heart failure and stage 1 through stage 4 chronic kidney disease, or unspecified chronic kidney disease: Secondary | ICD-10-CM | POA: Insufficient documentation

## 2016-02-19 DIAGNOSIS — Z923 Personal history of irradiation: Secondary | ICD-10-CM | POA: Insufficient documentation

## 2016-02-19 DIAGNOSIS — F0390 Unspecified dementia without behavioral disturbance: Secondary | ICD-10-CM | POA: Diagnosis not present

## 2016-02-19 DIAGNOSIS — I700 Atherosclerosis of aorta: Secondary | ICD-10-CM | POA: Diagnosis not present

## 2016-02-19 DIAGNOSIS — E785 Hyperlipidemia, unspecified: Secondary | ICD-10-CM | POA: Insufficient documentation

## 2016-02-19 DIAGNOSIS — E876 Hypokalemia: Secondary | ICD-10-CM | POA: Insufficient documentation

## 2016-02-19 DIAGNOSIS — E119 Type 2 diabetes mellitus without complications: Secondary | ICD-10-CM

## 2016-02-19 DIAGNOSIS — G959 Disease of spinal cord, unspecified: Secondary | ICD-10-CM

## 2016-02-19 DIAGNOSIS — I10 Essential (primary) hypertension: Secondary | ICD-10-CM | POA: Diagnosis present

## 2016-02-19 DIAGNOSIS — Z950 Presence of cardiac pacemaker: Secondary | ICD-10-CM | POA: Insufficient documentation

## 2016-02-19 DIAGNOSIS — Z9221 Personal history of antineoplastic chemotherapy: Secondary | ICD-10-CM | POA: Insufficient documentation

## 2016-02-19 DIAGNOSIS — Z7901 Long term (current) use of anticoagulants: Secondary | ICD-10-CM | POA: Insufficient documentation

## 2016-02-19 HISTORY — DX: Paroxysmal atrial fibrillation: I48.0

## 2016-02-19 LAB — URINALYSIS, ROUTINE W REFLEX MICROSCOPIC
BILIRUBIN URINE: NEGATIVE
GLUCOSE, UA: NEGATIVE mg/dL
KETONES UR: NEGATIVE mg/dL
NITRITE: NEGATIVE
PH: 7 (ref 5.0–8.0)
PROTEIN: NEGATIVE mg/dL
Specific Gravity, Urine: 1.009 (ref 1.005–1.030)

## 2016-02-19 LAB — GLUCOSE, CAPILLARY: GLUCOSE-CAPILLARY: 130 mg/dL — AB (ref 65–99)

## 2016-02-19 LAB — CBC
HEMATOCRIT: 41.7 % (ref 36.0–46.0)
Hemoglobin: 14.2 g/dL (ref 12.0–15.0)
MCH: 30.9 pg (ref 26.0–34.0)
MCHC: 34.1 g/dL (ref 30.0–36.0)
MCV: 90.7 fL (ref 78.0–100.0)
PLATELETS: 218 10*3/uL (ref 150–400)
RBC: 4.6 MIL/uL (ref 3.87–5.11)
RDW: 16.7 % — AB (ref 11.5–15.5)
WBC: 8.6 10*3/uL (ref 4.0–10.5)

## 2016-02-19 LAB — BASIC METABOLIC PANEL
Anion gap: 9 (ref 5–15)
BUN: 26 mg/dL — AB (ref 6–20)
CHLORIDE: 97 mmol/L — AB (ref 101–111)
CO2: 30 mmol/L (ref 22–32)
Calcium: 9.1 mg/dL (ref 8.9–10.3)
Creatinine, Ser: 1.21 mg/dL — ABNORMAL HIGH (ref 0.44–1.00)
GFR, EST AFRICAN AMERICAN: 47 mL/min — AB (ref >60)
GFR, EST NON AFRICAN AMERICAN: 41 mL/min — AB (ref >60)
Glucose, Bld: 183 mg/dL — ABNORMAL HIGH (ref 65–99)
POTASSIUM: 3.2 mmol/L — AB (ref 3.5–5.1)
SODIUM: 136 mmol/L (ref 135–145)

## 2016-02-19 LAB — TSH: TSH: 1.092 u[IU]/mL (ref 0.350–4.500)

## 2016-02-19 LAB — MAGNESIUM: MAGNESIUM: 1.8 mg/dL (ref 1.7–2.4)

## 2016-02-19 LAB — TROPONIN I
Troponin I: 0.05 ng/mL (ref <0.03)
Troponin I: 0.05 ng/mL (ref <0.03)

## 2016-02-19 LAB — VITAMIN B12: VITAMIN B 12: 1998 pg/mL — AB (ref 180–914)

## 2016-02-19 LAB — CBG MONITORING, ED: GLUCOSE-CAPILLARY: 168 mg/dL — AB (ref 65–99)

## 2016-02-19 LAB — FOLATE: FOLATE: 26.8 ng/mL (ref >5.9)

## 2016-02-19 MED ORDER — ACETAMINOPHEN 325 MG PO TABS: 650.0000 mg | ORAL_TABLET | ORAL | Status: DC | PRN

## 2016-02-19 MED ORDER — SENNOSIDES-DOCUSATE SODIUM 8.6-50 MG PO TABS: 1.0000 | ORAL_TABLET | Freq: Every evening | ORAL | Status: DC | PRN

## 2016-02-19 MED ORDER — GLUCERNA SHAKE PO LIQD
237.0000 mL | Freq: Two times a day (BID) | ORAL | Status: DC
  Administered 2016-02-20 – 2016-02-21 (×3): 237 mL via ORAL
  Administered 2016-02-23: 1 via ORAL
  Filled 2016-02-19 (×11): qty 237

## 2016-02-19 MED ORDER — DONEPEZIL HCL 10 MG PO TABS
10.0000 mg | ORAL_TABLET | Freq: Every day | ORAL | Status: DC
  Administered 2016-02-19 – 2016-02-22 (×4): 10 mg via ORAL
  Filled 2016-02-19 (×4): qty 1

## 2016-02-19 MED ORDER — STROKE: EARLY STAGES OF RECOVERY BOOK: Freq: Once | Status: DC

## 2016-02-19 MED ORDER — POTASSIUM CHLORIDE CRYS ER 20 MEQ PO TBCR
40.0000 meq | EXTENDED_RELEASE_TABLET | Freq: Once | ORAL | Status: DC
  Filled 2016-02-19: qty 2

## 2016-02-19 MED ORDER — APIXABAN 2.5 MG PO TABS
2.5000 mg | ORAL_TABLET | Freq: Two times a day (BID) | ORAL | Status: DC
  Administered 2016-02-19 – 2016-02-23 (×8): 2.5 mg via ORAL
  Filled 2016-02-19 (×8): qty 1

## 2016-02-19 MED ORDER — INSULIN ASPART 100 UNIT/ML ~~LOC~~ SOLN: 0.0000 [IU] | SUBCUTANEOUS | Status: DC

## 2016-02-19 MED ORDER — ALLOPURINOL 100 MG PO TABS
300.0000 mg | ORAL_TABLET | Freq: Every day | ORAL | Status: DC
  Administered 2016-02-20 – 2016-02-23 (×4): 300 mg via ORAL
  Filled 2016-02-19 (×4): qty 3

## 2016-02-19 MED ORDER — ACETAMINOPHEN 160 MG/5ML PO SOLN: 650.0000 mg | ORAL | Status: DC | PRN

## 2016-02-19 MED ORDER — AMIODARONE HCL 200 MG PO TABS
200.0000 mg | ORAL_TABLET | Freq: Every day | ORAL | Status: DC
  Administered 2016-02-20 – 2016-02-23 (×4): 200 mg via ORAL
  Filled 2016-02-19 (×4): qty 1

## 2016-02-19 MED ORDER — INSULIN ASPART 100 UNIT/ML ~~LOC~~ SOLN
0.0000 [IU] | Freq: Three times a day (TID) | SUBCUTANEOUS | Status: DC
  Administered 2016-02-19: 1 [IU] via SUBCUTANEOUS
  Administered 2016-02-20: 7 [IU] via SUBCUTANEOUS
  Administered 2016-02-20: 2 [IU] via SUBCUTANEOUS
  Administered 2016-02-21 (×2): 1 [IU] via SUBCUTANEOUS
  Administered 2016-02-21: 5 [IU] via SUBCUTANEOUS
  Administered 2016-02-22: 2 [IU] via SUBCUTANEOUS
  Administered 2016-02-22 – 2016-02-23 (×2): 3 [IU] via SUBCUTANEOUS
  Administered 2016-02-23 (×2): 2 [IU] via SUBCUTANEOUS

## 2016-02-19 MED ORDER — ACETAMINOPHEN 650 MG RE SUPP: 650.0000 mg | RECTAL | Status: DC | PRN

## 2016-02-19 MED ORDER — ATORVASTATIN CALCIUM 80 MG PO TABS
80.0000 mg | ORAL_TABLET | Freq: Every day | ORAL | Status: DC
  Administered 2016-02-19 – 2016-02-20 (×2): 80 mg via ORAL
  Filled 2016-02-19 (×2): qty 1

## 2016-02-19 NOTE — H&P (Addendum)
[facility] History and Physical  Allison Dunn UJW:[id number] DOB: 1931/09/26 DOA: 02/19/2016  PCP: Binnie Rail, MD  Patient coming from: Home  Chief Complaint: Weakness  HPI: Allison Dunn is a 82 y.o. female with a medical history of diabetes, hypertension, breast cancer, presented to the emergency department with complaints of weakness. Patient had fallen 2 days prior. She does open her daughter. Patient was in her bed today, and states she was unable to get out of bed. Per emergency room documentation, patient's daughter had heard a thump and found her mother on the floor. Patient had reported feeling swimmy headed. No complaints of headache. Currently patient denies any chest pain, shortness of breath, abdominal pain, nausea or vomiting, diarrhea constipation, sick contacts or recent travel. She does not recall the events of the last few hours. Patient does use a walker for ambulation. Currently her sister is at bedside, and cannot provide much information. Patient does sees Dr. Osie Cheeks for her heart and does have a pacemaker.  ED Course: CT had suggested infarct. [facility] called for admission.  Review of Systems:  All other systems reviewed and are negative.   Past Medical History:  Diagnosis Date  . Arthritis   . Cancer [facility])    breast, left; s/p lumpectomy/chemo/radiation  . Depression   . Diabetes mellitus without complication ([place])   . Hyperlipidemia   . Hypertension   . Presence of permanent cardiac pacemaker   . Renal disorder     Past Surgical History:  Procedure Laterality Date  . BREAST LUMPECTOMY Left   . CATARACT EXTRACTION    . EYE SURGERY    . kidney stone removal     x3  . LAPAROSCOPIC NEPHRECTOMY Left 07/29/2015   Procedure: LEFT LAPAROSCOPIC SIMPLE NEPHRECTOMY;  Surgeon: Ardis Hughs, MD;  Location: [facility];  Service: Urology;  Laterality: Left;  . LUMBAR SPINE SURGERY     laminectomy and rod    Social History:  reports that she  has never smoked. She has never used smokeless tobacco. She reports that she does not drink alcohol or use drugs.   No Known Allergies  Family History  Problem Relation Age of Onset  . Cancer Mother     pancreatic  . Diabetes Sister   . Hypertension Sister   . Hypertension Brother     Prior to Admission medications   Medication Sig Start Date End Date Taking? Authorizing Provider  allopurinol (ZYLOPRIM) 300 MG tablet TAKE ONE TABLET BY MOUTH ONCE DAILY 12/27/15  Yes Binnie Rail, MD  amiodarone (PACERONE) 200 MG tablet Take 1 tablet (200 mg total) by mouth daily. 09/03/15  Yes Binnie Rail, MD  amLODipine (NORVASC) 5 MG tablet Take 1 tablet (5 mg total) by mouth daily. 09/06/15  Yes Binnie Rail, MD  apixaban (ELIQUIS) 2.5 MG TABS tablet Take 1 tablet (2.5 mg total) by mouth 2 (two) times daily. 09/03/15 02/19/16 Yes Binnie Rail, MD  atorvastatin (LIPITOR) 80 MG tablet Take 1 tablet (80 mg total) by mouth daily with supper. 09/03/15  Yes Binnie Rail, MD  benazepril (LOTENSIN) 10 MG tablet Take 1 tablet (10 mg total) by mouth daily. 09/06/15  Yes Binnie Rail, MD  Cholecalciferol (D3-1000 PO) Take 1 tablet by mouth daily with lunch.   Yes Historical Provider, MD  Cyanocobalamin 1500 MCG TBDP Take 1,500 mcg by mouth daily with lunch.    Yes Historical Provider, MD  donepezil (ARICEPT) 10 MG tablet Take  1 tablet (10 mg total) by mouth at bedtime. 10/19/15  Yes Cameron Sprang, MD  insulin lispro (HUMALOG) 100 UNIT/ML injection Inject 0-0.1 mls (0-10 units total) into the skin 3 times daily before meals. Sliding Scale. Give 5 units if greater than 200.   Yes Historical Provider, MD  Multiple Vitamins-Minerals (WOMENS MULTI VITAMIN & MINERAL PO) Take 1 tablet by mouth daily with lunch.    Yes Historical Provider, MD  feeding supplement, GLUCERNA SHAKE, (GLUCERNA SHAKE) LIQD Take 237 mLs by mouth 2 (two) times daily between meals.    Historical Provider, MD  glucose blood (ONE TOUCH TEST STRIPS)  test strip Use as instructed to check blood sugar. DX E11.9 07/21/15   Binnie Rail, MD  Insulin Syringe-Needle U-100 (INSULIN SYRINGE .5CC/31GX5/16") 31G X 5/16" 0.5 ML MISC  04/03/15   Historical Provider, MD  Lancets (ONETOUCH ULTRASOFT) lancets Use as instructed to check blood sugar daily. DX E11.9 07/21/15   Binnie Rail, MD    Physical Exam: Vitals:   02/19/16 1242 02/19/16 1500  BP:  177/67  Pulse: 60 (!) 59  Resp:  11  Temp: 98.6 F (37 C)      General: Well developed, thin, NAD, appears stated age  HEENT: NCAT, PERRLA, EOMI, Anicteic Sclera, mucous membranes moist.   Neck: Supple, no JVD, no masses  Cardiovascular: S1 S2 auscultated, no rubs, murmurs or gallops. Regular rate and rhythm.  Respiratory: Clear to auscultation bilaterally with equal chest rise  Abdomen: Soft, nontender, nondistended, + bowel sounds  Extremities: warm dry without cyanosis clubbing or edema, muscle atrophy.  Neuro: AAOx 2 (self and place, not situation or year), cranial nerves grossly intact. Overall strength weak, however, moreso on the RUE 4/5. Grip strength 5/5, LE 5/5. Decreased visual field bilaterally.   Skin: Without rashes exudates or nodules  Psych: Normal affect and demeanor with intact judgement and insight  Labs on Admission: I have personally reviewed following labs and imaging studies CBC:  Recent Labs Lab 02/19/16 1318  WBC 8.6  HGB 14.2  HCT 41.7  MCV 90.7  PLT 630   Basic Metabolic Panel:  Recent Labs Lab 02/19/16 1318  NA 136  K 3.2*  CL 97*  CO2 30  GLUCOSE 183*  BUN 26*  CREATININE 1.21*  CALCIUM 9.1   GFR: CrCl cannot be calculated (Unknown ideal weight.). Liver Function Tests: No results for input(s): AST, ALT, ALKPHOS, BILITOT, PROT, ALBUMIN in the last 168 hours. No results for input(s): LIPASE, AMYLASE in the last 168 hours. No results for input(s): AMMONIA in the last 168 hours. Coagulation Profile: No results for input(s): INR, PROTIME  in the last 168 hours. Cardiac Enzymes:  Recent Labs Lab 02/19/16 1320  TROPONINI 0.05*   BNP (last 3 results) No results for input(s): PROBNP in the last 8760 hours. HbA1C: No results for input(s): HGBA1C in the last 72 hours. CBG: No results for input(s): GLUCAP in the last 168 hours. Lipid Profile: No results for input(s): CHOL, HDL, LDLCALC, TRIG, CHOLHDL, LDLDIRECT in the last 72 hours. Thyroid Function Tests: No results for input(s): TSH, T4TOTAL, FREET4, T3FREE, THYROIDAB in the last 72 hours. Anemia Panel: No results for input(s): VITAMINB12, FOLATE, FERRITIN, TIBC, IRON, RETICCTPCT in the last 72 hours. Urine analysis:    Component Value Date/Time   COLORURINE YELLOW 02/19/2016 1330   APPEARANCEUR CLEAR 02/19/2016 1330   LABSPEC 1.009 02/19/2016 1330   PHURINE 7.0 02/19/2016 1330   GLUCOSEU NEGATIVE 02/19/2016 1330   HGBUR  SMALL (A) 02/19/2016 1330   BILIRUBINUR NEGATIVE 02/19/2016 1330   KETONESUR NEGATIVE 02/19/2016 1330   PROTEINUR NEGATIVE 02/19/2016 1330   NITRITE NEGATIVE 02/19/2016 1330   LEUKOCYTESUR SMALL (A) 02/19/2016 1330   Sepsis Labs: '@LABRCNTIP'$ (procalcitonin:4,lacticidven:4) )No results found for this or any previous visit (from the past 240 hour(s)).   Radiological Exams on Admission: Ct Head Wo Contrast  Result Date: 02/19/2016 CLINICAL DATA:  Weakness. EXAM: CT HEAD WITHOUT CONTRAST TECHNIQUE: Contiguous axial images were obtained from the base of the skull through the vertex without intravenous contrast. COMPARISON:  CT scan of April 14, 2015. FINDINGS: Brain: Mild chronic ischemic white matter disease is noted. No mass effect or midline shift is noted. Ventricular size is within normal limits. There is no evidence of mass lesion or hemorrhage. There is interval development of low density involving the left occipital lobe concerning for infarction of indeterminate age. Vascular: No hyperdense vessel or unexpected calcification. Skull: Normal.  Negative for fracture or focal lesion. Sinuses/Orbits: No acute finding. Other: None. IMPRESSION: Interval development of left occipital low density concerning for infarction of indeterminate age. MRI is recommended for further evaluation. Electronically Signed   By: Marijo Conception, M.D.   On: 02/19/2016 15:13    EKG: Independently reviewed. Atrial paced, rate 60  Assessment/Plan  Generalized weakness, rule out CVA -CT head showed no full left development of left occipital low density concerning for infarction of indeterminate age. -Neurology consulted and appreciated -Will transfer patient to [facility] -Echocardiogram, carotid Doppler, lipid panel, carotid Doppler -Cannot obtain MRI due to pacemaker placement. -Neuro checks -Continue Eliquis and statin -PT/OT/Speech consulted -Check TSH, B12, Folate, Thiamine  Addendum: Spoke with [employer] rep- patient has MRI compatible PPM.  Spoke with neurology. Will add MRI/MRA brain.  Mildly elevated troponin -Troponin 0.05, currently no complaints of chest pain -Will ask cardiology to have pacemaker interrogated -Cycle troponins  Hypokalemia -Will replace and continue to monitor -Magnesium level ordered  Diabetes mellitus, type II -Hold lispro -Will place on insulin by scale was CBG monitoring  Essential hypertension -Allow for permissive hypertension -Hold benazepril amlodipine  Paroxysmal atrial fibrillation -Continue Eliquis, amiodarone -Currently atrially paced -After reviewing chart, patient was started on Eliquis in January 2017 by Dr. Lovena Le.  Pacemaker was interrogated a that time and was working properly. She does have a history of sinus node dysfunction.   Dementia  -Continue Aricept   Chronic kidney disease, stage III -Creatinine appears to be stable, continue to monitor BMP  DVT prophylaxis: Eliquis  Code Status: Full  Family Communication: Sister at bedside. Admission, patients condition and plan of care including  tests being ordered have been discussed with the patient and sister who indicate understanding and agree with the plan and Code Status.  Disposition Plan: Home    Consults called: Neurology, Dr. Cheral Marker   Admission status: Observation    Time spent: 65 minutes  Demontez Novack D.O. [facility] Pager [phone]  If 7PM-7AM, please contact night-coverage [email or web address] Password [email or web address] 02/19/2016, 4:26 PM

## 2016-02-19 NOTE — ED Notes (Signed)
Bed: WA21 Expected date:  Expected time:  Means of arrival:  Comments: UTI?

## 2016-02-19 NOTE — ED Provider Notes (Signed)
[facility] DEPT Provider Note   CSN: [id number] Arrival date & time: 02/19/16  1222     History   Chief Complaint Chief Complaint  Patient presents with  . Weakness    HPI Allison Dunn is a 82 y.o. female.  HPI Patient has had several falls over the course of the last week. Most recent fall was 2 days ago. Patient's daughter lives with her. She reports that the patient was already in bed in the evening. She heard a thump and went to check on her and she was in the floor. Reportedly she was trying to get something. EMS came and assisted. Patient seemed fine at that time at baseline. Patient and daughter refused transport. At baseline she uses a walker to ambulate in her home. This morning, the patient felt too weak to get out of bed. She reports she feels "swimmy headed". No headache. She did note that it seemed particularly difficult to raise her right arm. She does not identify other focal feeling of an extremity that does not work. Patient denies chest pain recent cough or shortness of breath abdominal pain. No recent fevers or chills. Past Medical History:  Diagnosis Date  . Arthritis   . Cancer [facility])    breast, left; s/p lumpectomy/chemo/radiation  . Depression   . Diabetes mellitus without complication ([place])   . Hyperlipidemia   . Hypertension   . Presence of permanent cardiac pacemaker   . Renal disorder     Patient Active Problem List   Diagnosis Date Noted  . Diaphragmatic injury as surgical complication 78/29/5621  . Postoperative anemia due to acute blood loss 08/14/2015  . Dementia without behavioral disturbance 08/14/2015  . Chronic UTI 07/29/2015  . 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 Klebsiella sepsis ([place]) 05/02/2015  . CAP (community acquired pneumonia) 05/02/2015  . Transaminasemia   . Sepsis ([place]) 04/22/2015  . Paroxysmal atrial fibrillation ([place]) 04/22/2015  . Delirium   . Urinary tract infectious disease   . Osteoarthritis  03/27/2015  . CVA (cerebral vascular accident) ([place]) 03/23/2015  . Moderate tricuspid regurgitation 03/11/2015  . Cardiomyopathy ([place]), EF 40-45%  02/2015 03/11/2015  . Mild mitral regurgitation 03/11/2015  . Pulmonary arterial hypertension ([place]), mild 03/11/2015  . Mild cognitive impairment 02/24/2015  . Type 2 diabetes mellitus without complication, with long-term current use of insulin ([place]) 02/24/2015  . Hypertensive heart disease with CHF (congestive heart failure) ([place]) 02/24/2015  . Essential hypertension, benign 02/21/2015  . Hyperlipidemia 02/19/2015  . Gout 02/19/2015  . PAD (peripheral artery disease) ([place]) 02/19/2015  . Cardiac pacemaker in situ 02/19/2015  . CKD (chronic kidney disease) 02/19/2015    Past Surgical History:  Procedure Laterality Date  . BREAST LUMPECTOMY Left   . CATARACT EXTRACTION    . EYE SURGERY    . kidney stone removal     x3  . LAPAROSCOPIC NEPHRECTOMY Left 07/29/2015   Procedure: LEFT LAPAROSCOPIC SIMPLE NEPHRECTOMY;  Surgeon: Ardis Hughs, MD;  Location: [facility];  Service: Urology;  Laterality: Left;  . LUMBAR SPINE SURGERY     laminectomy and rod    OB History    No data available       Home Medications    Prior to Admission medications   Medication Sig Start Date End Date Taking? Authorizing Provider  allopurinol (ZYLOPRIM) 300 MG tablet TAKE ONE TABLET BY MOUTH ONCE DAILY 12/27/15  Yes Binnie Rail, MD  amiodarone (PACERONE) 200 MG tablet Take 1 tablet (200 mg total) by  mouth daily. 09/03/15  Yes Binnie Rail, MD  amLODipine (NORVASC) 5 MG tablet Take 1 tablet (5 mg total) by mouth daily. 09/06/15  Yes Binnie Rail, MD  apixaban (ELIQUIS) 2.5 MG TABS tablet Take 1 tablet (2.5 mg total) by mouth 2 (two) times daily. 09/03/15 02/19/16 Yes Binnie Rail, MD  atorvastatin (LIPITOR) 80 MG tablet Take 1 tablet (80 mg total) by mouth daily with supper. 09/03/15  Yes Binnie Rail, MD  benazepril (LOTENSIN) 10 MG tablet Take 1 tablet (10  mg total) by mouth daily. 09/06/15  Yes Binnie Rail, MD  Cholecalciferol (D3-1000 PO) Take 1 tablet by mouth daily with lunch.   Yes Historical Provider, MD  Cyanocobalamin 1500 MCG TBDP Take 1,500 mcg by mouth daily with lunch.    Yes Historical Provider, MD  donepezil (ARICEPT) 10 MG tablet Take 1 tablet (10 mg total) by mouth at bedtime. 10/19/15  Yes Cameron Sprang, MD  insulin lispro (HUMALOG) 100 UNIT/ML injection Inject 0-0.1 mls (0-10 units total) into the skin 3 times daily before meals. Sliding Scale. Give 5 units if greater than 200.   Yes Historical Provider, MD  Multiple Vitamins-Minerals (WOMENS MULTI VITAMIN & MINERAL PO) Take 1 tablet by mouth daily with lunch.    Yes Historical Provider, MD  feeding supplement, GLUCERNA SHAKE, (GLUCERNA SHAKE) LIQD Take 237 mLs by mouth 2 (two) times daily between meals.    Historical Provider, MD  glucose blood (ONE TOUCH TEST STRIPS) test strip Use as instructed to check blood sugar. DX E11.9 07/21/15   Binnie Rail, MD  insulin detemir (LEVEMIR) 100 UNIT/ML injection Inject 0.06 mLs (6 Units total) into the skin at bedtime. If less than 250 do not give. Patient not taking: Reported on 02/19/2016 09/03/15   Binnie Rail, MD  Insulin Syringe-Needle U-100 (INSULIN SYRINGE .5CC/31GX5/16") 31G X 5/16" 0.5 ML MISC  04/03/15   Historical Provider, MD  Lancets (ONETOUCH ULTRASOFT) lancets Use as instructed to check blood sugar daily. DX E11.9 07/21/15   Binnie Rail, MD  senna-docusate (SENOKOT-S) 8.6-50 MG tablet Take 2 tablets by mouth daily. Hold for diarrhea. Patient not taking: Reported on 02/19/2016 07/30/15   Acie Fredrickson, MD    Family History Family History  Problem Relation Age of Onset  . Cancer Mother     pancreatic  . Diabetes Sister   . Hypertension Sister   . Hypertension Brother     Social History Social History  Substance Use Topics  . Smoking status: Never Smoker  . Smokeless tobacco: Never Used  . Alcohol use No      Allergies   Patient has no known allergies.   Review of Systems Review of Systems  10 Systems reviewed and are negative for acute change except as noted in the HPI.  Physical Exam Updated Vital Signs BP 177/67 (BP Location: Right Arm)   Pulse (!) 59   Temp 98.6 F (37 C) (Rectal)   Resp 11   SpO2 100%   Physical Exam  Constitutional: She is oriented to person, place, and time.  Patient is alert and nontoxic. No respiratory distress.  HENT:  Head: Normocephalic and atraumatic.  Right Ear: External ear normal.  Left Ear: External ear normal.  Nose: Nose normal.  Mouth/Throat: Oropharynx is clear and moist.  Eyes: Conjunctivae and EOM are normal. Pupils are equal, round, and reactive to light.  Neck: Neck supple.  Cardiovascular: Normal rate, regular rhythm, normal heart sounds and  intact distal pulses.   Pulmonary/Chest: Effort normal. No respiratory distress.  Breath sounds seem somewhat diminished at the left base. Diffusely breath sounds are soft. No gross wheeze or rail.  Abdominal: Soft. Bowel sounds are normal. She exhibits no distension. There is no tenderness. There is no guarding.  Musculoskeletal: She exhibits no edema.  Patient has general muscular atrophy. No peripheral edema. No deformity or apparent injury.  Neurological: She is alert and oriented to person, place, and time. No cranial nerve deficit.  Patient perceives some weakness and elevating right extremity upper also been relative to left. Patient however can hold arms in extension and pronated without drift. Grip strength seems symmetric 5\5. Patient can independently elevate each lower extremity off the bed and hold. 5\5 flexion extension at the ankle.  Skin: Skin is warm and dry.  Psychiatric: She has a normal mood and affect.     ED Treatments / Results  Labs (all labs ordered are listed, but only abnormal results are displayed) Labs Reviewed  BASIC METABOLIC PANEL - Abnormal; Notable for  the following:       Result Value   Potassium 3.2 (*)    Chloride 97 (*)    Glucose, Bld 183 (*)    BUN 26 (*)    Creatinine, Ser 1.21 (*)    GFR calc non Af Amer 41 (*)    GFR calc Af Amer 47 (*)    All other components within normal limits  CBC - Abnormal; Notable for the following:    RDW 16.7 (*)    All other components within normal limits  URINALYSIS, ROUTINE W REFLEX MICROSCOPIC - Abnormal; Notable for the following:    Hgb urine dipstick SMALL (*)    Leukocytes, UA SMALL (*)    Bacteria, UA RARE (*)    Squamous Epithelial / LPF 0-5 (*)    All other components within normal limits  TROPONIN I - Abnormal; Notable for the following:    Troponin I 0.05 (*)    All other components within normal limits    EKG  EKG Interpretation  Date/Time:  Saturday February 19 2016 14:35:54 EST Ventricular Rate:  60 PR Interval:    QRS Duration: 88 QT Interval:  508 QTC Calculation: 508 R Axis:   22 Text Interpretation:  Atrial-paced rhythm LVH with secondary repolarization abnormality Anterior infarct, old Prolonged QT interval agree. no sig change from previous Confirmed by Johnney Killian, MD, Jeannie Done [phone]) on 02/19/2016 3:05:31 PM       Radiology Ct Head Wo Contrast  Result Date: 02/19/2016 CLINICAL DATA:  Weakness. EXAM: CT HEAD WITHOUT CONTRAST TECHNIQUE: Contiguous axial images were obtained from the base of the skull through the vertex without intravenous contrast. COMPARISON:  CT scan of April 14, 2015. FINDINGS: Brain: Mild chronic ischemic white matter disease is noted. No mass effect or midline shift is noted. Ventricular size is within normal limits. There is no evidence of mass lesion or hemorrhage. There is interval development of low density involving the left occipital lobe concerning for infarction of indeterminate age. Vascular: No hyperdense vessel or unexpected calcification. Skull: Normal. Negative for fracture or focal lesion. Sinuses/Orbits: No acute finding. Other:  None. IMPRESSION: Interval development of left occipital low density concerning for infarction of indeterminate age. MRI is recommended for further evaluation. Electronically Signed   By: Marijo Conception, M.D.   On: 02/19/2016 15:13    Procedures Procedures (including critical care time)  Medications Ordered in ED Medications - No data  to display   Initial Impression / Assessment and Plan / ED Course  I have reviewed the triage vital signs and the nursing notes.  Pertinent labs & imaging results that were available during my care of the patient were reviewed by me and considered in my medical decision making (see chart for details).  Clinical Course    Consult: [facility] dr. Ree Kida for admission.  Final Clinical Impressions(s) / ED Diagnoses   Final diagnoses:  Weakness  Cerebrovascular accident (CVA) due to thrombosis of cerebral artery [facility])   Patient presents with initially reporting generalized weakness and unable to get out of bed this morning. With increased questioning however patient endorses that she felt that it was very difficult to raise her right arm specifically. CT has suggests infarct. Time of onset is unknown. Patient had gone to bed the previous night without symptoms. She awakened with symptoms. Patient is however alert and appropriate. Airway well protected. Plan will be for admission New Prescriptions New Prescriptions   No medications on file     Charlesetta Shanks, MD 02/19/16 [phone]

## 2016-02-19 NOTE — Consult Note (Signed)
NEURO HOSPITALIST CONSULT NOTE   Requestig physician: Dr. Ree Kida  Reason for Consult: Lethargy and weakness. Left occipital lobe stroke on CT, appears chronic but new since prior CT in February.   History obtained from:  Chart    HPI:                                                                                                                                          Allison Dunn is an 82 y.o. female who presented to [facility] ED with initial c/c by family of generalized weakness and unable to get out of bed this morning. With increased questioning, the patient endorsed that she felt that it was very difficult to raise her right arm specifically. A CT was obtained revealing a chronic-appearing left occipital lobe stroke, which was new since prior CT in February. She was out of IV tPA and endovascular time windows. She was transferred to [facility] for stroke work up.   She has had several falls over the past week, the most recent being two days ago. Initially today, the patient's daughter, who lives with her, heard a thump and went to check on her - she was in the floor. EMS was called but on arrival the patient "seemed fine". The patient and daughter then refused transport.   Patient endorsed feeling "swimmy headed" this morning as well as diffusely weak, but without headache. Other than focal right arm weakness, she denied other limb deficits.   Her PMHx includes dementia, paroxysmal atrial fibrillation, arthritis, breast CA s/p surgery and chemo/radrx, depression, DM, HLD, HTN, renal disorder and cardiac pacemaker.   Home medications include Eliquis, Lipitor and donepezil.   Past Medical History:  Diagnosis Date  . Arthritis   . Cancer [facility])    breast, left; s/p lumpectomy/chemo/radiation  . Depression   . Diabetes mellitus without complication ([place])   . Hyperlipidemia   . Hypertension   . Presence of permanent cardiac pacemaker   . Renal disorder     Past Surgical  History:  Procedure Laterality Date  . BREAST LUMPECTOMY Left   . CATARACT EXTRACTION    . EYE SURGERY    . kidney stone removal     x3  . LAPAROSCOPIC NEPHRECTOMY Left 07/29/2015   Procedure: LEFT LAPAROSCOPIC SIMPLE NEPHRECTOMY;  Surgeon: Ardis Hughs, MD;  Location: [facility] ORS;  Service: Urology;  Laterality: Left;  . LUMBAR SPINE SURGERY     laminectomy and rod    Family History  Problem Relation Age of Onset  . Cancer Mother     pancreatic  . Diabetes Sister   . Hypertension Sister   . Hypertension Brother     Social History:  reports that she has never smoked. She has never used smokeless tobacco. She reports  that she does not drink alcohol or use drugs.  No Known Allergies  MEDICATIONS:                                                                                                                     Scheduled: .  stroke: mapping our early stages of recovery book   Does not apply Once  . [START ON 02/20/2016] allopurinol  300 mg Oral Daily  . [START ON 02/20/2016] amiodarone  200 mg Oral Daily  . apixaban  2.5 mg Oral BID  . atorvastatin  80 mg Oral Q supper  . donepezil  10 mg Oral QHS  . [START ON 02/20/2016] feeding supplement (GLUCERNA SHAKE)  237 mL Oral BID BM  . insulin aspart  0-9 Units Subcutaneous Q4H  . potassium chloride  40 mEq Oral Once    ROS:                                                                                                                                       History obtained from patient. No headache, chest pain, abdominal pain, SOB or N/V.   Blood pressure 121/65, pulse (!) 57, temperature 97.5 F (36.4 C), temperature source Axillary, resp. rate 14, SpO2 100 %.   General Examination:                                                                                                      HEENT-  Normocephalic/atraumatic. Lungs- Respirations unlabored. No gross wheezing.  Extremities- Warm and well-perfused  Neurological  Examination Mental Status: Awake and alert. Oriented to self, month, recent holiday and location. Not oriented to day or year. Pleasant and cooperative. Speech fluent without evidence of aphasia.  Able to follow all simple motor commands without difficulty. Cranial Nerves: II: Visual fields are constricted bilaterally. PERRL.  III,IV, VI: ptosis not present, extra-ocular motions intact bilaterally V,VII: Mild right facial droop. Facial light touch sensation normal bilaterally VIII: hearing intact to questions and commands IX,X:  no hypophonia XI: bilateral shoulder shrug is equal XII: midline tongue extension Motor: Right : Upper extremity   2-3/5 at deltoid, 3/5 triceps and biceps, 4/5 grip  Lower extremity 4+/5 Left:  Upper extremity 5/5  Lower extremity 5/5     Sensory: Temperature and light touch intact x 4 Deep Tendon Reflexes: Hypoactive x 4  Plantars: Equivocal Cerebellar: No ataxia with FNF bilaterally Gait: Deferred   Lab Results: Basic Metabolic Panel:  Recent Labs Lab 02/19/16 1318 02/19/16 1633  NA 136  --   K 3.2*  --   CL 97*  --   CO2 30  --   GLUCOSE 183*  --   BUN 26*  --   CREATININE 1.21*  --   CALCIUM 9.1  --   MG  --  1.8    Liver Function Tests: No results for input(s): AST, ALT, ALKPHOS, BILITOT, PROT, ALBUMIN in the last 168 hours. No results for input(s): LIPASE, AMYLASE in the last 168 hours. No results for input(s): AMMONIA in the last 168 hours.  CBC:  Recent Labs Lab 02/19/16 1318  WBC 8.6  HGB 14.2  HCT 41.7  MCV 90.7  PLT 218    Cardiac Enzymes:  Recent Labs Lab 02/19/16 1320  TROPONINI 0.05*    Lipid Panel: No results for input(s): CHOL, TRIG, HDL, CHOLHDL, VLDL, LDLCALC in the last 168 hours.  CBG:  Recent Labs Lab 02/19/16 1656  GLUCAP 168*    Microbiology: Results for orders placed or performed during the hospital encounter of 07/26/15  Urine culture     Status: Abnormal   Collection Time: 07/26/15 10:30  AM  Result Value Ref Range Status   Specimen Description URINE, RANDOM  Final   Special Requests NONE  Final   Culture >=100,000 COLONIES/mL KLEBSIELLA PNEUMONIAE (A)  Final   Report Status 07/30/2015 FINAL  Final   Organism ID, Bacteria KLEBSIELLA PNEUMONIAE (A)  Final      Susceptibility   Klebsiella pneumoniae - MIC*    AMPICILLIN >=32 RESISTANT Resistant     CEFAZOLIN >=64 RESISTANT Resistant     CEFTRIAXONE <=1 SENSITIVE Sensitive     CIPROFLOXACIN 1 SENSITIVE Sensitive     GENTAMICIN <=1 SENSITIVE Sensitive     IMIPENEM <=0.25 SENSITIVE Sensitive     NITROFURANTOIN 256 RESISTANT Resistant     TRIMETH/SULFA >=320 RESISTANT Resistant     AMPICILLIN/SULBACTAM >=32 RESISTANT Resistant     PIP/TAZO 64 RESISTANT Resistant     * >=100,000 COLONIES/mL KLEBSIELLA PNEUMONIAE    Coagulation Studies: No results for input(s): LABPROT, INR in the last 72 hours.  Imaging: Ct Head Wo Contrast  Result Date: 02/19/2016 CLINICAL DATA:  Weakness. EXAM: CT HEAD WITHOUT CONTRAST TECHNIQUE: Contiguous axial images were obtained from the base of the skull through the vertex without intravenous contrast. COMPARISON:  CT scan of April 14, 2015. FINDINGS: Brain: Mild chronic ischemic white matter disease is noted. No mass effect or midline shift is noted. Ventricular size is within normal limits. There is no evidence of mass lesion or hemorrhage. There is interval development of low density involving the left occipital lobe concerning for infarction of indeterminate age. Vascular: No hyperdense vessel or unexpected calcification. Skull: Normal. Negative for fracture or focal lesion. Sinuses/Orbits: No acute finding. Other: None. IMPRESSION: Interval development of left occipital low density concerning for infarction of indeterminate age. MRI is recommended for further evaluation. Electronically Signed   By: Marijo Conception, M.D.   On: 02/19/2016 15:13  Assessment: 1. Chronic-appearing left occipital  lobe ischemic infarct on CT. This finding is new since prior CT performed in February of this year.  2. Possible new stroke with RUE weakness. May be too early or too small in size to be detectable on initial CT.  3. Paroxysmal atrial fibrillation, on Eliquis.   Recommendations: 1. Pacemaker is MRI compatible per admitting team. MRI brain will be of benefit in localizing possible new ischemic stroke.   2. MRA of head. 3. Carotid ultrasound.  4. TTE.  5. PT/OT/Speech.  6. Consider lowering her statin dose given her advanced age and complaint of diffuse weakness. 7. CK level and LFTs.    8. Continue Eliquis. If acute stroke is seen on MRI, switching to an anticoagulant with a different mechanism may be indicated (warfarin or dabigatran).  9. Gentle IV hydration.  10. Out of permissive HTN time window.  11. Continue donepezil.  Electronically signed: Dr. Kerney Elbe 02/19/2016, 6:26 PM

## 2016-02-19 NOTE — Progress Notes (Signed)
Patient arrived to unit via stretcher with [facility].  Alert, verbal, pleasantly confused.  On call, and Dr. Asher Muir paged notification of patient arrival.

## 2016-02-19 NOTE — ED Triage Notes (Signed)
Per EMS. Pt family states pt has been lethargic and weak. Pt states she feels slightly confused but answered questions appropriately for EMS. Pt fell 2 days ago. Pt has hx of dementia. CBG 244.

## 2016-02-20 ENCOUNTER — Observation Stay (HOSPITAL_BASED_OUTPATIENT_CLINIC_OR_DEPARTMENT_OTHER): Payer: Medicare Other

## 2016-02-20 ENCOUNTER — Observation Stay (HOSPITAL_COMMUNITY): Payer: Medicare Other

## 2016-02-20 ENCOUNTER — Encounter (HOSPITAL_COMMUNITY): Payer: Self-pay | Admitting: Radiology

## 2016-02-20 DIAGNOSIS — I6330 Cerebral infarction due to thrombosis of unspecified cerebral artery: Secondary | ICD-10-CM

## 2016-02-20 DIAGNOSIS — N189 Chronic kidney disease, unspecified: Secondary | ICD-10-CM | POA: Diagnosis not present

## 2016-02-20 DIAGNOSIS — I6320 Cerebral infarction due to unspecified occlusion or stenosis of unspecified precerebral arteries: Secondary | ICD-10-CM | POA: Diagnosis not present

## 2016-02-20 LAB — GLUCOSE, CAPILLARY
GLUCOSE-CAPILLARY: 93 mg/dL (ref 65–99)
GLUCOSE-CAPILLARY: 157 mg/dL — AB (ref 65–99)
GLUCOSE-CAPILLARY: 307 mg/dL — AB (ref 65–99)
GLUCOSE-CAPILLARY: 196 mg/dL — AB (ref 65–99)

## 2016-02-20 LAB — TROPONIN I
TROPONIN I: 0.05 ng/mL — AB (ref <0.03)
Troponin I: 0.05 ng/mL (ref <0.03)

## 2016-02-20 LAB — LIPID PANEL
CHOL/HDL RATIO: 3 ratio
Cholesterol: 222 mg/dL — ABNORMAL HIGH (ref 0–200)
HDL: 74 mg/dL (ref >40)
LDL CALC: 133 mg/dL — AB (ref 0–99)
Triglycerides: 74 mg/dL (ref <150)
VLDL: 15 mg/dL (ref 0–40)

## 2016-02-20 NOTE — Evaluation (Signed)
Physical Therapy Evaluation Patient Details Name: Allison Dunn MRN: [id number] DOB: 1932-01-18 Today's Date: 02/20/2016   History of Present Illness  Patient is a 82 y/o female with hx of cognitive impairment, SSS, pacemaker, HTN, A-fib, DM, depression presents with weakness. Head CT suggestive of left occipital low density concerning for infarction. Cannot do MRI due to pacemaker.  Clinical Impression  Patient presents with generalized weakness, dizziness and impaired mobility s/p above. Pt not a great historian so difficult to obtain PLOF/history. Tolerated short distance ambulation with Min A for balance/safety. Distance limited due to feeling dizzy. BP stable. Will need to see if pt lives with sister or daughter and if they can provide supervision for all OOB mobility as pt fall risk at this time due to dizziness. Will follow acutely to maximize independence and mobility prior to return home.    Follow Up Recommendations Home health PT;Supervision for mobility/OOB;Supervision/Assistance - 24 hour    Equipment Recommendations  None recommended by PT    Recommendations for Other Services OT consult     Precautions / Restrictions Precautions Precautions: Fall Restrictions Weight Bearing Restrictions: No      Mobility  Bed Mobility Overal bed mobility: Needs Assistance Bed Mobility: Supine to Sit     Supine to sit: Min assist;HOB elevated     General bed mobility comments: Increased time and effort to get to EOB with cues for technique.  Transfers Overall transfer level: Needs assistance Equipment used: Rolling walker (2 wheeled) Transfers: Sit to/from Stand Sit to Stand: Min guard;Min assist         General transfer comment: Assist initially to power to standing- stood from EOB x3 for peri care. Transferred to chair post ambulation.  Ambulation/Gait Ambulation/Gait assistance: Min assist   Assistive device: Rolling walker (2 wheeled) Gait Pattern/deviations:  Step-through pattern;Decreased stride length;Trunk flexed Gait velocity: decreased   General Gait Details: Slow, unsteady gait wtih forward flexed trunk. Reports feeling "swimmy headed" BP stable.   Stairs            Wheelchair Mobility    Modified Rankin (Stroke Patients Only) Modified Rankin (Stroke Patients Only) Pre-Morbid Rankin Score: Moderate disability Modified Rankin: Moderately severe disability     Balance Overall balance assessment: Needs assistance;History of Falls Sitting-balance support: Feet supported;No upper extremity supported Sitting balance-Leahy Scale: Fair     Standing balance support: During functional activity;Bilateral upper extremity supported Standing balance-Leahy Scale: Poor Standing balance comment: Reliant on BUes for support in standing. Total A for pericare                             Pertinent Vitals/Pain Pain Assessment: No/denies pain    Home Living Family/patient expects to be discharged to:: Private residence Living Arrangements: Other relatives (but MD note says with daughter?) Available Help at Discharge: Family Type of Home: House Home Access: Stairs to enter Entrance Stairs-Rails: Right Entrance Stairs-Number of Steps: 4-5 Home Layout: One level Home Equipment: Environmental consultant - 2 wheels;Shower seat - built in;Hand held shower head Additional Comments: questionable accuracy, no family present. At one tme reports living in an apt with no stairs and then states house with stairs.    Prior Function Level of Independence: Independent with assistive device(s)         Comments: pt states she did her own bathing, dressing and fixed her own breakfast. sister did the cooking.     Hand Dominance   Dominant Hand: Right  Extremity/Trunk Assessment   Upper Extremity Assessment Upper Extremity Assessment: Defer to OT evaluation    Lower Extremity Assessment Lower Extremity Assessment: Generalized weakness        Communication   Communication: No difficulties  Cognition Arousal/Alertness: Awake/alert Behavior During Therapy: WFL for tasks assessed/performed Overall Cognitive Status: No family/caregiver present to determine baseline cognitive functioning                 General Comments: Pt with inconsistent information reported. Hx of dementia noted. POor awareness/insight as pt sitting in stool and had no idea.    General Comments      Exercises     Assessment/Plan    PT Assessment Patient needs continued PT services  PT Problem List Decreased strength;Decreased mobility;Decreased safety awareness;Decreased cognition;Decreased activity tolerance;Decreased balance;Cardiopulmonary status limiting activity          PT Treatment Interventions DME instruction;Therapeutic activities;Therapeutic exercise;Gait training;Patient/family education;Functional mobility training;Balance training;Stair training    PT Goals (Current goals can be found in the Care Plan section)  Acute Rehab PT Goals Patient Stated Goal: none stated PT Goal Formulation: With patient Time For Goal Achievement: 02/27/16 Potential to Achieve Goals: Fair    Frequency Min 4X/week   Barriers to discharge Decreased caregiver support not sure level of support at home    Co-evaluation               End of Session Equipment Utilized During Treatment: Gait belt Activity Tolerance: Patient tolerated treatment well Patient left: in chair;with call bell/phone within reach;with chair alarm set (no box but pad under patient) Nurse Communication: Mobility status    Functional Assessment Tool Used: clinical judgment Functional Limitation: Mobility: Walking and moving around Mobility: Walking and Moving Around Current Status (T9774): At least 20 percent but less than 40 percent impaired, limited or restricted Mobility: Walking and Moving Around Goal Status [phone]): At least 1 percent but less than 20 percent  impaired, limited or restricted    Time: 0837-0900 PT Time Calculation (min) (ACUTE ONLY): 23 min   Charges:   PT Evaluation $PT Eval Moderate Complexity: 1 Procedure PT Treatments $Therapeutic Activity: 8-22 mins   PT G Codes:   PT G-Codes **NOT FOR INPATIENT CLASS** Functional Assessment Tool Used: clinical judgment Functional Limitation: Mobility: Walking and moving around Mobility: Walking and Moving Around Current Status (R3202): At least 20 percent but less than 40 percent impaired, limited or restricted Mobility: Walking and Moving Around Goal Status [phone]): At least 1 percent but less than 20 percent impaired, limited or restricted    [place] 02/20/2016, 9:28 AM Wray Kearns, [place], DPT [phone]

## 2016-02-20 NOTE — Progress Notes (Signed)
PROGRESS NOTE  Allison Dunn OJJ:[id number] DOB: [birth year] DOA: 02/19/2016 PCP: Binnie Rail, MD   LOS: 0 days   Brief Narrative: Allison Dunn is a 82 y.o. female with a medical history of diabetes, hypertension, breast cancer, presented to the emergency department with complaints of weakness  Assessment & Plan: Active Problems:   Hyperlipidemia   CKD (chronic kidney disease)   Essential hypertension, benign   Type 2 diabetes mellitus without complication, with long-term current use of insulin (HCC)   CVA (cerebral vascular accident) ([place])   Paroxysmal atrial fibrillation ([place])   Dementia without behavioral disturbance   Weakness generalized   Generalized weakness, rule out CVA - CT head showed no full left development of left occipital low density concerning for infarction of indeterminate age. - Neurology consulted and appreciated - Echocardiogram, carotid Doppler pending - Dr. Ree Kida d/w [employer] representative on admission, patient has an MRI compatible PPM. For some reason, there is a note from radiology that the PPM is not MRI compatible based on chest x-ray appearance. I reviewed the PPM again today on [email or web address], as well as discussed with on-call electrophysiology, the PPM appears compatible, and chest x-ray is not ideal to determine compatibility of a pacemaker - PT/OT/Speech consulted  Mildly elevated troponin - Troponin 0.05, flat, not in a pattern consistent with ACS  Hypokalemia - Will replace and continue to monitor  Diabetes mellitus, type II - Hold lispro - SSI  Essential hypertension - Allow for permissive hypertension - Hold benazepril amlodipine  Paroxysmal atrial fibrillation - Continue Eliquis, amiodarone - Currently atrially paced - After reviewing chart, patient was started on Eliquis in January 2017 by Dr. Lovena Le.  Pacemaker was interrogated a that time and was working properly. She does have a history of sinus node  dysfunction. We'll defer to neurology choice of anticoagulation if this is indeed failure of her current regimen  Dementia  - Continue Aricept   Chronic kidney disease, stage III - Creatinine appears to be stable, continue to monitor BMP   DVT prophylaxis: Eliquis Code Status: Full Family Communication: no family bedside Disposition Plan: TBD  Consultants:   Neurology   Procedures:   2D echo  Antimicrobials:  None    Subjective: - no chest pain, shortness of breath, no abdominal pain, nausea or vomiting. Feels overall weak  Objective: Vitals:   02/20/16 0400 02/20/16 0600 02/20/16 1201 02/20/16 1332  BP: (!) 155/58 (!) 153/58 (!) 162/71 (!) 152/60  Pulse: 60 60 (!) 59 (!) 59  Resp: '16 16 16 16  '$ Temp: 98.6 F (37 C) 98.8 F (37.1 C) 97.7 F (36.5 C) 98.1 F (36.7 C)  TempSrc: Oral Oral Oral Oral  SpO2: 96% 95% 100% 98%   No intake or output data in the 24 hours ending 02/20/16 1407 There were no vitals filed for this visit.  Examination: Constitutional: NAD Vitals:   02/20/16 0400 02/20/16 0600 02/20/16 1201 02/20/16 1332  BP: (!) 155/58 (!) 153/58 (!) 162/71 (!) 152/60  Pulse: 60 60 (!) 59 (!) 59  Resp: '16 16 16 16  '$ Temp: 98.6 F (37 C) 98.8 F (37.1 C) 97.7 F (36.5 C) 98.1 F (36.7 C)  TempSrc: Oral Oral Oral Oral  SpO2: 96% 95% 100% 98%   Eyes: PERRL, lids and conjunctivae normal Respiratory: clear to auscultation bilaterally, no wheezing, no crackles. Cardiovascular: Regular rate and rhythm, no murmurs / rubs / gallops. Abdomen: no tenderness. Bowel sounds positive.  Musculoskeletal: no clubbing / cyanosis.  Neurologic: no  focal findings   Data Reviewed: I have personally reviewed following labs and imaging studies  CBC:  Recent Labs Lab 02/19/16 1318  WBC 8.6  HGB 14.2  HCT 41.7  MCV 90.7  PLT 469   Basic Metabolic Panel:  Recent Labs Lab 02/19/16 1318 02/19/16 1633  NA 136  --   K 3.2*  --   CL 97*  --   CO2 30  --     GLUCOSE 183*  --   BUN 26*  --   CREATININE 1.21*  --   CALCIUM 9.1  --   MG  --  1.8   GFR: CrCl cannot be calculated (Unknown ideal weight.). Liver Function Tests: No results for input(s): AST, ALT, ALKPHOS, BILITOT, PROT, ALBUMIN in the last 168 hours. No results for input(s): LIPASE, AMYLASE in the last 168 hours. No results for input(s): AMMONIA in the last 168 hours. Coagulation Profile: No results for input(s): INR, PROTIME in the last 168 hours. Cardiac Enzymes:  Recent Labs Lab 02/19/16 1320 02/19/16 1827 02/20/16 0010 02/20/16 0440  TROPONINI 0.05* 0.05* 0.05* 0.05*   BNP (last 3 results) No results for input(s): PROBNP in the last 8760 hours. HbA1C: No results for input(s): HGBA1C in the last 72 hours. CBG:  Recent Labs Lab 02/19/16 1656 02/19/16 2139 02/20/16 0623 02/20/16 1128  GLUCAP 168* 130* 93 157*   Lipid Profile:  Recent Labs  02/20/16 0440  CHOL 222*  HDL 74  LDLCALC 133*  TRIG 74  CHOLHDL 3.0   Thyroid Function Tests:  Recent Labs  02/19/16 1827  TSH 1.092   Anemia Panel:  Recent Labs  02/19/16 1827  VITAMINB12 1,998*  FOLATE 26.8   Urine analysis:    Component Value Date/Time   COLORURINE YELLOW 02/19/2016 1330   APPEARANCEUR CLEAR 02/19/2016 1330   LABSPEC 1.009 02/19/2016 1330   PHURINE 7.0 02/19/2016 1330   GLUCOSEU NEGATIVE 02/19/2016 1330   HGBUR SMALL (A) 02/19/2016 1330   BILIRUBINUR NEGATIVE 02/19/2016 1330   KETONESUR NEGATIVE 02/19/2016 1330   PROTEINUR NEGATIVE 02/19/2016 1330   NITRITE NEGATIVE 02/19/2016 1330   LEUKOCYTESUR SMALL (A) 02/19/2016 1330   Sepsis Labs: Invalid input(s): PROCALCITONIN, LACTICIDVEN  No results found for this or any previous visit (from the past 240 hour(s)).    Radiology Studies: Dg Chest 2 View  Result Date: 02/19/2016 CLINICAL DATA:  Cerebral vascular accident. EXAM: CHEST  2 VIEW COMPARISON:  Radiograph July 29, 2015. FINDINGS: The heart size and mediastinal  contours are within normal limits. Right-sided pacemaker is unchanged in position. Atherosclerosis of thoracic aorta is noted. No pneumothorax or pleural effusion is noted. Both lungs are clear. The visualized skeletal structures are unremarkable. IMPRESSION: No active cardiopulmonary disease.  Aortic atherosclerosis. Electronically Signed   By: Marijo Conception, M.D.   On: 02/19/2016 20:58   Ct Head Wo Contrast  Result Date: 02/19/2016 CLINICAL DATA:  Weakness. EXAM: CT HEAD WITHOUT CONTRAST TECHNIQUE: Contiguous axial images were obtained from the base of the skull through the vertex without intravenous contrast. COMPARISON:  CT scan of April 14, 2015. FINDINGS: Brain: Mild chronic ischemic white matter disease is noted. No mass effect or midline shift is noted. Ventricular size is within normal limits. There is no evidence of mass lesion or hemorrhage. There is interval development of low density involving the left occipital lobe concerning for infarction of indeterminate age. Vascular: No hyperdense vessel or unexpected calcification. Skull: Normal. Negative for fracture or focal lesion. Sinuses/Orbits: No acute finding.  Other: None. IMPRESSION: Interval development of left occipital low density concerning for infarction of indeterminate age. MRI is recommended for further evaluation. Electronically Signed   By: Marijo Conception, M.D.   On: 02/19/2016 15:13     Scheduled Meds: .  stroke: mapping our early stages of recovery book   Does not apply Once  . allopurinol  300 mg Oral Daily  . amiodarone  200 mg Oral Daily  . apixaban  2.5 mg Oral BID  . atorvastatin  80 mg Oral Q supper  . donepezil  10 mg Oral QHS  . feeding supplement (GLUCERNA SHAKE)  237 mL Oral BID BM  . insulin aspart  0-9 Units Subcutaneous TID PC & HS  . potassium chloride  40 mEq Oral Once   Continuous Infusions:   Marzetta Board, MD, PhD [facility] Pager [phone]  If 7PM-7AM, please contact  night-coverage [email or web address] Password [email or web address] 02/20/2016, 2:07 PM

## 2016-02-20 NOTE — Progress Notes (Signed)
CRITICAL VALUE ALERT  Critical value received: Troponin 0.05  Date of notification:  02/19/16  Time of notification:  1950  Critical value read back:Yes.    Nurse who received alert:  Linford Arnold  MD notified (1st page):  A. Hugelmeyer  Time of first page:  1950  MD notified (2nd page):  Time of second page:  Responding MD:   Time MD responded:

## 2016-02-20 NOTE — Progress Notes (Signed)
VASCULAR LAB PRELIMINARY  PRELIMINARY  PRELIMINARY  PRELIMINARY  Carotid duplex completed.    Preliminary report:  1-39% ICA plaquing. Vertebral artery flow is antegrade.   Shaili Donalson, RVT 02/20/2016, 10:42 AM

## 2016-02-20 NOTE — Evaluation (Signed)
Occupational Therapy Evaluation Patient Details Name: Allison Dunn MRN: [id number] DOB: [birth year] Today's Date: 02/20/2016    History of Present Illness Patient is a 82 y/o female with hx of cognitive impairment, SSS, pacemaker, HTN, A-fib, DM, depression presents with weakness. Head CT suggestive of left occipital low density concerning for infarction. Cannot do MRI due to pacemaker.   Clinical Impression   Pt reports she was managing ADL independently PTA. Currently pt requires min assist for basic transfers and ADL with the exception of max assist for LB ADL. Pt c/o dizziness with positional changes. Pt presenting with generalized weakness and poor standing balance impacting her independence and safety with ADL and functional mobility. Pt planning to d/c home with 24/7 supervision from her sister. Recommending HHOT for follow up to maximize independence and safety with ADL and functional mobility. Will need to confirm that pt has 24/7 supervision at home for safe d/c. Pt would benefit from continued skilled OT to address established goals.    Follow Up Recommendations  Home health OT;Supervision/Assistance - 24 hour    Equipment Recommendations  None recommended by OT    Recommendations for Other Services       Precautions / Restrictions Precautions Precautions: Fall Restrictions Weight Bearing Restrictions: No      Mobility Bed Mobility Overal bed mobility: Needs Assistance Bed Mobility: Supine to Sit     Supine to sit: Supervision     General bed mobility comments: Increased time and cues for sequencing  Transfers Overall transfer level: Needs assistance Equipment used: Rolling walker (2 wheeled) Transfers: Sit to/from Stand Sit to Stand: Min assist         General transfer comment: Light min assist for balance. Good hand placement. Increased time.    Balance Overall balance assessment: Needs assistance;History of Falls Sitting-balance support: Feet  supported;No upper extremity supported Sitting balance-Leahy Scale: Fair     Standing balance support: Bilateral upper extremity supported Standing balance-Leahy Scale: Poor Standing balance comment: Reliant on BUes for support in standing. Total A for pericare                            ADL Overall ADL's : Needs assistance/impaired Eating/Feeding: Set up;Sitting   Grooming: Set up;Supervision/safety;Sitting   Upper Body Bathing: Minimal assistance;Sitting   Lower Body Bathing: Maximal assistance;Sit to/from stand   Upper Body Dressing : Set up;Supervision/safety;Sitting   Lower Body Dressing: Maximal assistance;Sit to/from stand Lower Body Dressing Details (indicate cue type and reason): Pt able to adjust socks long sitting in bed Toilet Transfer: Minimal assistance;Stand-pivot;BSC;RW Toilet Transfer Details (indicate cue type and reason): Simulated by stand pivot to chair         Functional mobility during ADLs: Minimal assistance;Rolling walker (for stand pivot only) General ADL Comments: Limited mobillity due to pt reports of dizziness in sitting/standing.      Vision Additional Comments: Needs further assessment. Pt reports no visual changes; unsure of accuracy.   Perception     Praxis      Pertinent Vitals/Pain Pain Assessment: No/denies pain     Hand Dominance Right   Extremity/Trunk Assessment Upper Extremity Assessment Upper Extremity Assessment: Generalized weakness   Lower Extremity Assessment Lower Extremity Assessment: Defer to PT evaluation   Cervical / Trunk Assessment Cervical / Trunk Assessment: Kyphotic   Communication Communication Communication: No difficulties   Cognition Arousal/Alertness: Awake/alert Behavior During Therapy: WFL for tasks assessed/performed Overall Cognitive Status: No family/caregiver present to  determine baseline cognitive functioning                 General Comments: Pt with inconsistent home  sest up informaiont reported. Hx of dementia noted.   General Comments       Exercises       Shoulder Instructions      Home Living Family/patient expects to be discharged to:: Private residence Living Arrangements: Other relatives (sister per pt report) Available Help at Discharge: Family;Available 24 hours/day Type of Home: House Home Access: Stairs to enter [employer] of Steps: 4-5 Entrance Stairs-Rails: Right Home Layout: One level     Bathroom Shower/Tub: Tub/shower unit Shower/tub characteristics: Curtain Bathroom Toilet: Handicapped height     Home Equipment: Environmental consultant - 2 wheels;Hand held shower head;Shower seat   Additional Comments: question accuracy of pt report; reports different bathroom set up to PT vs OT      Prior Functioning/Environment Level of Independence: Independent with assistive device(s)        Comments: pt states she did her own bathing, dressing and fixed her own breakfast. sister did the cooking.        OT Problem List: Decreased strength;Decreased activity tolerance;Impaired balance (sitting and/or standing);Decreased cognition;Decreased knowledge of use of DME or AE   OT Treatment/Interventions: Self-care/ADL training;Therapeutic exercise;Energy conservation;DME and/or AE instruction;Therapeutic activities;Patient/family education;Balance training    OT Goals(Current goals can be found in the care plan section) Acute Rehab OT Goals Patient Stated Goal: none stated OT Goal Formulation: With patient Time For Goal Achievement: 03/05/16 Potential to Achieve Goals: Good ADL Goals Pt Will Perform Grooming: with supervision;standing Pt Will Perform Upper Body Bathing: with set-up;sitting;with supervision Pt Will Perform Lower Body Bathing: with supervision;sit to/from stand Pt Will Transfer to Toilet: with supervision;ambulating;regular height toilet Pt Will Perform Toileting - Clothing Manipulation and hygiene: with  supervision;sit to/from stand  OT Frequency: Min 2X/week   Barriers to D/C:            Co-evaluation              End of Session Equipment Utilized During Treatment: Surveyor, mining Communication: Mobility status  Activity Tolerance: Patient tolerated treatment well Patient left: in chair;with call bell/phone within reach;with chair alarm set   Time: 1829-9371 OT Time Calculation (min): 18 min Charges:  OT General Charges $OT Visit: 1 Procedure OT Evaluation $OT Eval Moderate Complexity: 1 Procedure G-Codes: OT G-codes **NOT FOR INPATIENT CLASS** Functional Assessment Tool Used: Clinical judgement Functional Limitation: Self care Self Care Current Status (I9678): At least 20 percent but less than 40 percent impaired, limited or restricted Self Care Goal Status (L3810): At least 1 percent but less than 20 percent impaired, limited or restricted   Binnie Kand M.S., OTR/L Pager: [phone]  02/20/2016, 11:57 AM

## 2016-02-20 NOTE — Progress Notes (Signed)
STROKE TEAM PROGRESS NOTE   HISTORY OF PRESENT ILLNESS (per record) Allison Dunn is an 82 y.o. female who presented to [facility] ED with initial c/c by family of generalized weakness and unable to get out of bed this morning. With increased questioning, the patient endorsed that she felt that it was very difficult to raise her right arm specifically. A CT was obtained revealing a chronic-appearing left occipital lobe stroke, which was new since prior CT in February. She was out of IV tPA and endovascular time windows. She was transferred to [facility] for stroke work up.   She has had several falls over the past week, the most recent being two days ago. Initially today, the patient's daughter, who lives with her, heard a thump and went to check on her - she was in the floor. EMS was called but on arrival the patient "seemed fine". The patient and daughter then refused transport.   Patient endorsed feeling "swimmy headed" this morning as well as diffusely weak, but without headache. Other than focal right arm weakness, she denied other limb deficits.   Her PMHx includes dementia, paroxysmal atrial fibrillation, arthritis, breast CA s/p surgery and chemo/radrx, depression, DM, HLD, HTN, renal disorder and cardiac pacemaker.   Home medications include Eliquis, Lipitor and donepezil.    SUBJECTIVE (INTERVAL HISTORY) Her family was not at the bedside.  Overall she feels her condition is improved.  She reports feeling amnestic to the reason she was brought to the hospital.  It will be helpful to obtain information from her family regarding her baseline cognition/memory   OBJECTIVE Temp:  [97.5 F (36.4 C)-98.8 F (37.1 C)] 98.8 F (37.1 C) (12/31 0600) Pulse Rate:  [52-61] 60 (12/31 0600) Cardiac Rhythm: Atrial paced (12/31 0700) Resp:  [11-16] 16 (12/31 0600) BP: (121-180)/(57-70) 153/58 (12/31 0600) SpO2:  [95 %-100 %] 95 % (12/31 0600)  CBC:   Recent Labs Lab 02/19/16 1318  WBC 8.6  HGB  14.2  HCT 41.7  MCV 90.7  PLT 947    Basic Metabolic Panel:   Recent Labs Lab 02/19/16 1318 02/19/16 1633  NA 136  --   K 3.2*  --   CL 97*  --   CO2 30  --   GLUCOSE 183*  --   BUN 26*  --   CREATININE 1.21*  --   CALCIUM 9.1  --   MG  --  1.8    Lipid Panel:     Component Value Date/Time   CHOL 222 (H) 02/20/2016 0440   TRIG 74 02/20/2016 0440   HDL 74 02/20/2016 0440   CHOLHDL 3.0 02/20/2016 0440   VLDL 15 02/20/2016 0440   LDLCALC 133 (H) 02/20/2016 0440   HgbA1c:  Lab Results  Component Value Date   HGBA1C 6.7 (H) 07/26/2015   Urine Drug Screen: No results found for: LABOPIA, COCAINSCRNUR, LABBENZ, AMPHETMU, THCU, LABBARB    IMAGING Dg Chest 2 View 02/19/2016 No active cardiopulmonary disease.  Aortic atherosclerosis.   Ct Head Wo Contrast 02/19/2016 Interval development of left occipital low density concerning for infarction of indeterminate age. MRI is recommended for further evaluation.    MRI / MRA Brain / Head - ordered ( pt has permanent pacemaker).  If patient is not a candidate for MRI, recommend repeat CT in AM  Carotid Doppler - 1-39% ICA plaquing. Vertebral artery flow is antegrade.   PHYSICAL EXAM HEENT-  Normocephalic/atraumatic. Lungs- CTA  Cardiac: irreg, irreg Abdomen:  Soft ND NT  Extremities- Warm and well-perfused  Neurological Examination Mental Status: Awake and alert. Oriented to self, month, and year.  Yesterday she was apparently not oriented to year. Pleasant and cooperative. Speech fluent without evidence of aphasia.  Able to follow all simple motor commands without difficulty. Cranial Nerves: II: Visual fields with some signs of right visual filed cut but not an enter HH. PERRL.  III,IV, VI: ptosis not present, extra-ocular motions intact bilaterally V,VII: Mild right facial droop. Facial light touch sensation normal bilaterally VIII: hearing intact to questions and commands IX,X: no hypophonia XI: bilateral  shoulder shrug is equal XII: midline tongue extension Motor: Right : Upper extremity   2-3/5 at deltoid, 3/5 triceps and biceps, 4/5 grip             Lower extremity 4+/5 Left:     Upper extremity 5/5             Lower extremity 5/5                                         Sensory: Temperature and light touch intact x 4 Deep Tendon Reflexes: Hypoactive x 4  Plantars: Equivocal Cerebellar: No ataxia with FNF bilaterally   ASSESSMENT/PLAN [name] is a 82 y.o. female with history of dementia, paroxysmal atrial fibrillation (Eliquis), arthritis, breast CA s/p surgery and chemo/radrx, depression, DM, HLD, HTN, renal disorder and cardiac pacemaker presenting with right upper extremity weakness, multiple falls, and feeling "swimmy headed". She did not receive IV t-PA due to late presentation and minimal deficits.  Stroke:  Dominant likely embolic secondary to paroxysmal atrial fibrillation.  Resultant  Right face and arm weakness with some visual field loss  MRI  ppm  MRA  ppm  Carotid Doppler - 1-39% ICA plaquing. Vertebral artery flow is antegrade.   2D Echo - pending  LDL - 133  HgbA1c pending  VTE prophylaxis - Eliquis Diet heart healthy/carb modified Room service appropriate? Yes; Fluid consistency: Thin  Eliquis (apixaban) daily prior to admission, now on Eliquis (apixaban) daily  Patient counseled to be compliant with her antithrombotic medications  Ongoing aggressive stroke risk factor management  Therapy recommendations: Home health physical and occupational therapies recommended  Disposition:  Pending  Hypertension  Stable  Permissive hypertension (OK if < 220/120) but gradually normalize in 5-7 days  Long-term BP goal normotensive  Hyperlipidemia  Home meds: Lipitor 80 mg daily resumed in hospital  LDL 133, goal < 70  Verify compliance  Continue statin at discharge  Diabetes  HgbA1c pending, goal < 7.0  Unc / Controlled  Other Stroke  Risk Factors  Advanced age  Obesity, There is no height or weight on file to calculate BMI., recommend weight loss, diet and exercise as appropriate   Paroxysmal atrial fibrillation  Other Active Problems  Hypokalemia - 3.2 - supplemented -> recheck Monday  Renal insufficiency - BUN 26 ; creatinine 1.21  Hospital day # 0  ATTENDING NOTE: Patient was seen and examined by me personally. Documentation reflects findings. The laboratory and radiographic studies reviewed by me. ROS completed by me personally and pertinent positives fully documented Condition:  stable  Assessment and plan completed by me personally and fully documented above. Plans/Recommendations include:     Recommend repeat CT if unable to undergo MRI  Stroke work-up ongoing   Will follow  SIGNED BY: Dr. Elissa Hefty  To contact Stroke Continuity provider, please refer to [email or web address] After hours, contact General Neurology

## 2016-02-20 NOTE — Progress Notes (Signed)
Pt has an implanted cardiac pacemaker. I have reviewed pt's CXR and this does not  have the distinguishing identifiers of a  MRI conditional pacemaker. This pt appears to not be a candidate for MRI.  Lynelle Doctor RT,R,MR

## 2016-02-21 ENCOUNTER — Observation Stay (HOSPITAL_COMMUNITY): Payer: Medicare Other

## 2016-02-21 DIAGNOSIS — F329 Major depressive disorder, single episode, unspecified: Secondary | ICD-10-CM | POA: Diagnosis not present

## 2016-02-21 DIAGNOSIS — G959 Disease of spinal cord, unspecified: Secondary | ICD-10-CM

## 2016-02-21 DIAGNOSIS — Z9221 Personal history of antineoplastic chemotherapy: Secondary | ICD-10-CM | POA: Diagnosis not present

## 2016-02-21 DIAGNOSIS — E119 Type 2 diabetes mellitus without complications: Secondary | ICD-10-CM

## 2016-02-21 DIAGNOSIS — E876 Hypokalemia: Secondary | ICD-10-CM | POA: Diagnosis not present

## 2016-02-21 DIAGNOSIS — R296 Repeated falls: Secondary | ICD-10-CM | POA: Diagnosis not present

## 2016-02-21 DIAGNOSIS — I6330 Cerebral infarction due to thrombosis of unspecified cerebral artery: Secondary | ICD-10-CM | POA: Diagnosis not present

## 2016-02-21 DIAGNOSIS — N183 Chronic kidney disease, stage 3 (moderate): Secondary | ICD-10-CM | POA: Diagnosis not present

## 2016-02-21 DIAGNOSIS — R531 Weakness: Secondary | ICD-10-CM | POA: Diagnosis not present

## 2016-02-21 DIAGNOSIS — I10 Essential (primary) hypertension: Secondary | ICD-10-CM | POA: Diagnosis not present

## 2016-02-21 DIAGNOSIS — I638 Other cerebral infarction: Secondary | ICD-10-CM | POA: Diagnosis not present

## 2016-02-21 DIAGNOSIS — G9389 Other specified disorders of brain: Secondary | ICD-10-CM | POA: Diagnosis not present

## 2016-02-21 DIAGNOSIS — Z794 Long term (current) use of insulin: Secondary | ICD-10-CM

## 2016-02-21 DIAGNOSIS — S0990XA Unspecified injury of head, initial encounter: Secondary | ICD-10-CM | POA: Diagnosis not present

## 2016-02-21 DIAGNOSIS — F0390 Unspecified dementia without behavioral disturbance: Secondary | ICD-10-CM | POA: Diagnosis not present

## 2016-02-21 DIAGNOSIS — Z853 Personal history of malignant neoplasm of breast: Secondary | ICD-10-CM | POA: Diagnosis not present

## 2016-02-21 DIAGNOSIS — E785 Hyperlipidemia, unspecified: Secondary | ICD-10-CM

## 2016-02-21 DIAGNOSIS — I5022 Chronic systolic (congestive) heart failure: Secondary | ICD-10-CM | POA: Diagnosis not present

## 2016-02-21 DIAGNOSIS — I672 Cerebral atherosclerosis: Secondary | ICD-10-CM | POA: Diagnosis not present

## 2016-02-21 DIAGNOSIS — Z950 Presence of cardiac pacemaker: Secondary | ICD-10-CM | POA: Diagnosis not present

## 2016-02-21 DIAGNOSIS — E1121 Type 2 diabetes mellitus with diabetic nephropathy: Secondary | ICD-10-CM | POA: Diagnosis not present

## 2016-02-21 DIAGNOSIS — E1122 Type 2 diabetes mellitus with diabetic chronic kidney disease: Secondary | ICD-10-CM | POA: Diagnosis not present

## 2016-02-21 DIAGNOSIS — I6523 Occlusion and stenosis of bilateral carotid arteries: Secondary | ICD-10-CM | POA: Diagnosis not present

## 2016-02-21 DIAGNOSIS — I480 Paroxysmal atrial fibrillation: Secondary | ICD-10-CM | POA: Diagnosis not present

## 2016-02-21 DIAGNOSIS — M1990 Unspecified osteoarthritis, unspecified site: Secondary | ICD-10-CM | POA: Diagnosis not present

## 2016-02-21 DIAGNOSIS — M25511 Pain in right shoulder: Secondary | ICD-10-CM | POA: Diagnosis not present

## 2016-02-21 DIAGNOSIS — Z923 Personal history of irradiation: Secondary | ICD-10-CM | POA: Diagnosis not present

## 2016-02-21 DIAGNOSIS — M4802 Spinal stenosis, cervical region: Secondary | ICD-10-CM | POA: Diagnosis not present

## 2016-02-21 DIAGNOSIS — I130 Hypertensive heart and chronic kidney disease with heart failure and stage 1 through stage 4 chronic kidney disease, or unspecified chronic kidney disease: Secondary | ICD-10-CM | POA: Diagnosis not present

## 2016-02-21 DIAGNOSIS — I700 Atherosclerosis of aorta: Secondary | ICD-10-CM | POA: Diagnosis not present

## 2016-02-21 LAB — GLUCOSE, CAPILLARY
GLUCOSE-CAPILLARY: 117 mg/dL — AB (ref 65–99)
Glucose-Capillary: 129 mg/dL — ABNORMAL HIGH (ref 65–99)
Glucose-Capillary: 255 mg/dL — ABNORMAL HIGH (ref 65–99)
Glucose-Capillary: 131 mg/dL — ABNORMAL HIGH (ref 65–99)

## 2016-02-21 LAB — BASIC METABOLIC PANEL
Anion gap: 7 (ref 5–15)
BUN: 21 mg/dL — AB (ref 6–20)
CALCIUM: 9 mg/dL (ref 8.9–10.3)
CHLORIDE: 98 mmol/L — AB (ref 101–111)
CO2: 32 mmol/L (ref 22–32)
CREATININE: 1.12 mg/dL — AB (ref 0.44–1.00)
GFR calc Af Amer: 52 mL/min — ABNORMAL LOW (ref >60)
GFR calc non Af Amer: 44 mL/min — ABNORMAL LOW (ref >60)
Glucose, Bld: 132 mg/dL — ABNORMAL HIGH (ref 65–99)
Potassium: 3.3 mmol/L — ABNORMAL LOW (ref 3.5–5.1)
SODIUM: 137 mmol/L (ref 135–145)

## 2016-02-21 MED ORDER — POTASSIUM CHLORIDE CRYS ER 20 MEQ PO TBCR
40.0000 meq | EXTENDED_RELEASE_TABLET | Freq: Once | ORAL | Status: AC
  Administered 2016-02-21: 40 meq via ORAL
  Filled 2016-02-21: qty 2

## 2016-02-21 MED ORDER — ATORVASTATIN CALCIUM 40 MG PO TABS
40.0000 mg | ORAL_TABLET | Freq: Every day | ORAL | Status: DC
  Administered 2016-02-21 – 2016-02-23 (×3): 40 mg via ORAL
  Filled 2016-02-21 (×3): qty 1

## 2016-02-21 NOTE — Progress Notes (Signed)
*  PRELIMINARY RESULTS* Echocardiogram 2D Echocardiogram has been performed.  Leavy Cella 02/21/2016, 4:36 PM

## 2016-02-21 NOTE — Progress Notes (Signed)
STROKE TEAM PROGRESS NOTE   SUBJECTIVE (INTERVAL HISTORY) Sister is at the bedside.  Overall she feels her condition is unchanged. She still reports RUE weaker than before after recent fall. However, she did have some mild right shoulder pain likely due to the fall. Her MRI showed no definite stroke but concerning for cervical spinal cord compression. Will need MRI C spine to evaluate. Sister stated that pt compliant with eliquis. Pacer interrogation did not show afib.    OBJECTIVE Temp:  [98.6 F (37 C)-99.1 F (37.3 C)] 98.8 F (37.1 C) (01/01 0907) Pulse Rate:  [60] 60 (01/01 0907) Cardiac Rhythm: Atrial paced (01/01 0700) Resp:  [16-20] 20 (01/01 0907) BP: (125-146)/(50-87) 125/81 (01/01 0907) SpO2:  [95 %-99 %] 98 % (01/01 0907)  CBC:   Recent Labs Lab 02/19/16 1318  WBC 8.6  HGB 14.2  HCT 41.7  MCV 90.7  PLT 829    Basic Metabolic Panel:   Recent Labs Lab 02/19/16 1318 02/19/16 1633 02/21/16 0449  NA 136  --  137  K 3.2*  --  3.3*  CL 97*  --  98*  CO2 30  --  32  GLUCOSE 183*  --  132*  BUN 26*  --  21*  CREATININE 1.21*  --  1.12*  CALCIUM 9.1  --  9.0  MG  --  1.8  --     Lipid Panel:     Component Value Date/Time   CHOL 222 (H) 02/20/2016 0440   TRIG 74 02/20/2016 0440   HDL 74 02/20/2016 0440   CHOLHDL 3.0 02/20/2016 0440   VLDL 15 02/20/2016 0440   LDLCALC 133 (H) 02/20/2016 0440   HgbA1c:  Lab Results  Component Value Date   HGBA1C 6.7 (H) 07/26/2015   Urine Drug Screen: No results found for: LABOPIA, COCAINSCRNUR, LABBENZ, AMPHETMU, THCU, LABBARB    IMAGING I have personally reviewed the radiological images below and agree with the radiology interpretations.  Dg Chest 2 View 02/19/2016 No active cardiopulmonary disease.  Aortic atherosclerosis.   Ct Head Wo Contrast 02/19/2016 Interval development of left occipital low density concerning for infarction of indeterminate age. MRI is recommended for further evaluation.   Mri and  Mra Head Wo Contrast 02/21/2016 IMPRESSION: 1. Widespread severe degenerative cervical spinal stenosis with severe cervical spinal cord mass effect. See series 3, image 12. 2. Chronic appearing occlusion or near-occlusion of the distal Right Vertebral artery and other severe intracranial atherosclerosis, including severe stenosis of the Right ICA siphon and Left PCA. 3. No definite acute infarct; unusual, symmetric signal changes in the bilateral cerebellar peduncles without strong evidence of acute infarct. Chronic infarcts in the left PCA territory with laminar necrosis. Chronic right brainstem lacune at the pons midbrain junction, and tiny chronic right superior motor strip infarct.    Carotid Doppler - 1-39% ICA plaquing. Vertebral artery flow is antegrade.   TTE pending  MRI C-spine pending   PHYSICAL EXAM  Temp:  [98.6 F (37 C)-99.1 F (37.3 C)] 98.8 F (37.1 C) (01/01 0907) Pulse Rate:  [60] 60 (01/01 0907) Resp:  [16-20] 20 (01/01 0907) BP: (125-146)/(50-87) 125/81 (01/01 0907) SpO2:  [95 %-99 %] 98 % (01/01 0907)  General - Well nourished, well developed, in no apparent distress.  Ophthalmologic - Fundi not visualized due to noncooperation.  Cardiovascular - Regular rate and rhythm.  Mental Status -  Level of arousal and orientation to place, and person were intact, but not orientated to time. Language including expression,  naming, repetition, comprehension was assessed and found intact. Fund of Knowledge was assessed and was impaired.  Cranial Nerves II - XII - II - Visual field intact OU. III, IV, VI - Extraocular movements intact. V - Facial sensation intact bilaterally. VII - Facial movement intact bilaterally. VIII - Hearing & vestibular intact bilaterally. X - Palate elevates symmetrically. XI - Chin turning & shoulder shrug intact bilaterally. XII - Tongue protrusion intact.  Motor Strength - The patient's strength was 4+/5 in all extremities except RUE  proximal 3/5 and pronator drift was absent.  Bulk was normal and fasciculations were absent.   Motor Tone - Muscle tone was assessed at the neck and appendages and was normal.  Reflexes - The patient's reflexes were 1+ in all extremities and she had no pathological reflexes.  Sensory - Light touch, temperature/pinprick were assessed and were symmetrical.    Coordination - The patient had normal movements in the hands with no ataxia or dysmetria although right FTN slow and difficult but no ataxia.  Tremor was absent.  Gait and Station - deferred    ASSESSMENT/PLAN Ms. KAMYLA OLEJNIK is a 83 y.o. female with history of dementia, paroxysmal atrial fibrillation (Eliquis), arthritis, breast CA s/p surgery and chemo/radrx, depression, DM, HLD, HTN, renal disorder and cardiac pacemaker presenting with right upper extremity weakness, multiple falls, and feeling "swimmy headed". She did not receive IV t-PA due to late presentation and minimal deficits.  RUE weakness - concerning for cervical myelopathy vs. Radiculopathy post fall  Resultant  Right arm weakness  MRI brain - no definite acute infarct but old right motor strip, pons, left PCA infarcts. In addition, concerning for cervical spinal cord compression  MRI C-spine - pending  MRA  Right VA occlusion, left PCA and right ICA siphon severe stenosis  Carotid Doppler - 1-39% ICA plaquing. Vertebral artery flow is antegrade.   2D Echo - pending  LDL - 133  HgbA1c pending  VTE prophylaxis - Eliquis Diet heart healthy/carb modified Room service appropriate? Yes; Fluid consistency: Thin  Eliquis (apixaban) daily prior to admission, now on Eliquis (apixaban) daily. Continue eliquis 2.'5mg'$  bid.   Patient counseled to be compliant with her antithrombotic medications  Ongoing aggressive stroke risk factor management  Therapy recommendations: Home health PT/OT  Disposition:  Pending  pAfib  Pacer interrogation showed no afib  burdon  On eliquis 2.'5mg'$  bid  Continue eliquis  Recent fall  Hit right side body  Mild right shoulder pain  No obvious fracture  Hypertension  Stable  Permissive hypertension (OK if < 220/120) but gradually normalize in 5-7 days  Long-term BP goal normotensive  Hyperlipidemia  Home meds: Lipitor 80 mg daily resumed in hospital  LDL 133, goal < 70  Continue statin at discharge  Diabetes  HgbA1c pending, goal < 7.0  SSI  CBG monitoring  Other Stroke Risk Factors  Advanced age  Other Active Problems  Hypokalemia - 3.2 ->3.3  Renal insufficiency - creatinine 1.21->1.12  Hospital day # 0  Rosalin Hawking, MD PhD Stroke Neurology 02/21/2016 10:42 PM   To contact Stroke Continuity provider, please refer to [email or web address] After hours, contact General Neurology

## 2016-02-21 NOTE — Progress Notes (Signed)
PROGRESS NOTE  Allison BERKEMEIER ERX:[id number] DOB: May 12, 1931 DOA: 02/19/2016 PCP: Binnie Rail, MD   LOS: 0 days   Brief Narrative: Allison Dunn is a 83 y.o. female with a medical history of diabetes, hypertension, breast cancer, presented to the emergency department with complaints of weakness  Assessment & Plan: Active Problems:   Hyperlipidemia   CKD (chronic kidney disease)   Essential hypertension, benign   Type 2 diabetes mellitus without complication, with long-term current use of insulin (HCC)   Cerebrovascular accident (CVA) due to thrombosis of cerebral artery (HCC)   Paroxysmal atrial fibrillation (HCC)   Dementia without behavioral disturbance   Weakness generalized   Generalized weakness, rule out CVA - CT head showed no full left development of left occipital low density concerning for infarction of indeterminate age. - Neurology consulted and appreciated - Echocardiogram, carotid Doppler pending - Dr. Ree Kida d/w [employer] representative on admission, patient has an MRI compatible PPM. For some reason, there is a note from radiology that the PPM is not MRI compatible based on chest x-ray appearance. I reviewed the PPM again today on [email or web address], as well as discussed with on-call electrophysiology, the PPM appears compatible, and chest x-ray is not ideal to determine compatibility of a pacemaker - PT/OT/Speech consulted - MRI and 2D echo still pending  Mildly elevated troponin - Troponin 0.05, flat, not in a pattern consistent with ACS  Hypokalemia - Will replace and continue to monitor  Diabetes mellitus, type II - Hold lispro - SSI  Essential hypertension - Allow for permissive hypertension - Hold benazepril amlodipine  Paroxysmal atrial fibrillation - Continue Eliquis, amiodarone - Currently atrially paced - After reviewing chart, patient was started on Eliquis in January 2017 by Dr. Lovena Le.  Pacemaker was interrogated a that time  and was working properly. She does have a history of sinus node dysfunction. We'll defer to neurology choice of anticoagulation if this is indeed failure of her current regimen  Dementia  - Continue Aricept   Chronic kidney disease, stage III - Creatinine appears to be stable, continue to monitor BMP   DVT prophylaxis: Eliquis Code Status: Full Family Communication: no family bedside Disposition Plan: TBD  Consultants:   Neurology   Procedures:   2D echo  Antimicrobials:  None    Subjective: - no chest pain, shortness of breath, no abdominal pain, nausea or vomiting. Feels overall weak  Objective: Vitals:   02/20/16 2103 02/21/16 0220 02/21/16 0512 02/21/16 0907  BP: (!) 129/55 (!) 142/65 (!) 146/50 125/81  Pulse: 60 60 60 60  Resp: '18 18 18 20  '$ Temp: 98.9 F (37.2 C) 98.7 F (37.1 C) 99.1 F (37.3 C) 98.8 F (37.1 C)  TempSrc: Oral Oral Oral Oral  SpO2: 99% 99% 95% 98%   No intake or output data in the 24 hours ending 02/21/16 1325 There were no vitals filed for this visit.  Examination: Constitutional: NAD Vitals:   02/20/16 2103 02/21/16 0220 02/21/16 0512 02/21/16 0907  BP: (!) 129/55 (!) 142/65 (!) 146/50 125/81  Pulse: 60 60 60 60  Resp: '18 18 18 20  '$ Temp: 98.9 F (37.2 C) 98.7 F (37.1 C) 99.1 F (37.3 C) 98.8 F (37.1 C)  TempSrc: Oral Oral Oral Oral  SpO2: 99% 99% 95% 98%   Eyes: PERRL, lids and conjunctivae normal Respiratory: clear to auscultation bilaterally, no wheezing, no crackles. Cardiovascular: Regular rate and rhythm, no murmurs / rubs / gallops. Abdomen: no tenderness. Bowel sounds positive.  Musculoskeletal: no  clubbing / cyanosis.  Neurologic: no focal findings   Data Reviewed: I have personally reviewed following labs and imaging studies  CBC:  Recent Labs Lab 02/19/16 1318  WBC 8.6  HGB 14.2  HCT 41.7  MCV 90.7  PLT 710   Basic Metabolic Panel:  Recent Labs Lab 02/19/16 1318 02/19/16 1633 02/21/16 0449    NA 136  --  137  K 3.2*  --  3.3*  CL 97*  --  98*  CO2 30  --  32  GLUCOSE 183*  --  132*  BUN 26*  --  21*  CREATININE 1.21*  --  1.12*  CALCIUM 9.1  --  9.0  MG  --  1.8  --    GFR: CrCl cannot be calculated (Unknown ideal weight.). Liver Function Tests: No results for input(s): AST, ALT, ALKPHOS, BILITOT, PROT, ALBUMIN in the last 168 hours. No results for input(s): LIPASE, AMYLASE in the last 168 hours. No results for input(s): AMMONIA in the last 168 hours. Coagulation Profile: No results for input(s): INR, PROTIME in the last 168 hours. Cardiac Enzymes:  Recent Labs Lab 02/19/16 1320 02/19/16 1827 02/20/16 0010 02/20/16 0440  TROPONINI 0.05* 0.05* 0.05* 0.05*   BNP (last 3 results) No results for input(s): PROBNP in the last 8760 hours. HbA1C: No results for input(s): HGBA1C in the last 72 hours. CBG:  Recent Labs Lab 02/20/16 1128 02/20/16 1653 02/20/16 2102 02/21/16 0625 02/21/16 1101  GLUCAP 157* 307* 196* 129* 255*   Lipid Profile:  Recent Labs  02/20/16 0440  CHOL 222*  HDL 74  LDLCALC 133*  TRIG 74  CHOLHDL 3.0   Thyroid Function Tests:  Recent Labs  02/19/16 1827  TSH 1.092   Anemia Panel:  Recent Labs  02/19/16 1827  VITAMINB12 1,998*  FOLATE 26.8   Urine analysis:    Component Value Date/Time   COLORURINE YELLOW 02/19/2016 1330   APPEARANCEUR CLEAR 02/19/2016 1330   LABSPEC 1.009 02/19/2016 1330   PHURINE 7.0 02/19/2016 1330   GLUCOSEU NEGATIVE 02/19/2016 1330   HGBUR SMALL (A) 02/19/2016 1330   BILIRUBINUR NEGATIVE 02/19/2016 1330   KETONESUR NEGATIVE 02/19/2016 1330   PROTEINUR NEGATIVE 02/19/2016 1330   NITRITE NEGATIVE 02/19/2016 1330   LEUKOCYTESUR SMALL (A) 02/19/2016 1330   Sepsis Labs: Invalid input(s): PROCALCITONIN, LACTICIDVEN  No results found for this or any previous visit (from the past 240 hour(s)).    Radiology Studies: Dg Chest 2 View  Result Date: 02/19/2016 CLINICAL DATA:  Cerebral  vascular accident. EXAM: CHEST  2 VIEW COMPARISON:  Radiograph July 29, 2015. FINDINGS: The heart size and mediastinal contours are within normal limits. Right-sided pacemaker is unchanged in position. Atherosclerosis of thoracic aorta is noted. No pneumothorax or pleural effusion is noted. Both lungs are clear. The visualized skeletal structures are unremarkable. IMPRESSION: No active cardiopulmonary disease.  Aortic atherosclerosis. Electronically Signed   By: Marijo Conception, M.D.   On: 02/19/2016 20:58   Ct Head Wo Contrast  Result Date: 02/19/2016 CLINICAL DATA:  Weakness. EXAM: CT HEAD WITHOUT CONTRAST TECHNIQUE: Contiguous axial images were obtained from the base of the skull through the vertex without intravenous contrast. COMPARISON:  CT scan of April 14, 2015. FINDINGS: Brain: Mild chronic ischemic white matter disease is noted. No mass effect or midline shift is noted. Ventricular size is within normal limits. There is no evidence of mass lesion or hemorrhage. There is interval development of low density involving the left occipital lobe concerning for infarction  of indeterminate age. Vascular: No hyperdense vessel or unexpected calcification. Skull: Normal. Negative for fracture or focal lesion. Sinuses/Orbits: No acute finding. Other: None. IMPRESSION: Interval development of left occipital low density concerning for infarction of indeterminate age. MRI is recommended for further evaluation. Electronically Signed   By: Marijo Conception, M.D.   On: 02/19/2016 15:13   Mr Virgel Paling VE Contrast  Result Date: 02/21/2016 CLINICAL DATA:  83 year old female with generalized weakness and recent falls. Right arm weakness. Query stroke. Initial encounter. EXAM: MRI HEAD WITHOUT CONTRAST MRA HEAD WITHOUT CONTRAST TECHNIQUE: Multiplanar, multiecho pulse sequences of the brain and surrounding structures were obtained without intravenous contrast. Angiographic images of the head were obtained using MRA  technique without contrast. COMPARISON:  Head CTs without contrast 02/19/2016 and earlier. FINDINGS: MRI HEAD FINDINGS Brain: Chronic lacunar infarct along the ventral left thalamus. Small to moderate area of cortical encephalomalacia in the left occipital pole with some laminar necrosis. Small chronic lacunar infarct in the left central brainstem at the junction of the midbrain and pons (series 6, image 9). Normal signal throughout the remaining lower brainstem. There is a tiny chronic cortical infarct at the right superior frontal gyrus motor strip (series 8, image 20). Minimal for age nonspecific cerebral white matter signal changes. There are fairly symmetric signal changes in the cerebellar peduncle is with T2 and FLAIR hyperintensity (series 6 image 7). There is associated increased trace diffusion signal, but this does not appear restricted (series 4, image 11 and series 400, image 11). The cerebellum otherwise appears normal. No convincing restricted diffusion to suggest acute infarction. No midline shift, mass effect, evidence of mass lesion, ventriculomegaly, extra-axial collection or acute intracranial hemorrhage. Cervicomedullary junction and pituitary are within normal limits. Vascular: Major intracranial vascular flow voids are remarkable for loss of the distal right vertebral artery flow void. The distal left vertebral artery appears normal, and the basilar artery flow void is maintained. Other Major intracranial vascular flow voids are preserved. Skull and upper cervical spine: Multilevel severe degenerative appearing cervical spinal stenosis and severe spinal cord mass effect (series 3, image 12). Widespread severe cervical disc space loss. Normal bone marrow signal. Sinuses/Orbits: Normal orbits aside from postoperative changes to both globes. Visualized paranasal sinuses and mastoids are stable and well pneumatized. Other: Mild mastoid effusions greater on the left. Negative nasopharynx. Grossly  normal visualized other internal auditory structures. Negative scalp soft tissues. MRA HEAD FINDINGS Antegrade flow in the distal left vertebral artery which is irregular but without significant stenosis. Minimal antegrade flow signal in the distal right vertebral artery. There is probably retrograde supply to the right V4 segment and right PICA from the vertebrobasilar junction. The left PICA origin is patent. The basilar artery is irregular but without hemodynamically significant stenosis. Both SCA and PCA origins are patent although there is moderate to severe left and mild-to-moderate right widespread P1 and P2 segment irregularity with stenosis. Distal left PCA branches are attenuated. Posterior communicating arteries are diminutive or absent. Antegrade flow in both ICA siphons with severe irregularity throughout the right cavernous and supraclinoid segments. Associated moderate or severe stenosis perhaps most pronounced at the distal cavernous segment. The right ICA terminus remains patent. The right MCA origin is normal. Left siphon antegrade flow with up to moderate irregularity in the cavernous and supraclinoid segments but only mild stenosis. Normal left ICA terminus, left MCA and ACA origins. The right A1 is diminutive or absent. The anterior communicating artery and visualized ACA branches are within normal  limits. MCA M1 segments and bifurcations are patent without stenosis. No MCA branch occlusion is identified. Mild bilateral MCA branch irregularity. IMPRESSION: 1. Widespread severe degenerative cervical spinal stenosis with severe cervical spinal cord mass effect. See series 3, image 12. 2. Chronic appearing occlusion or near-occlusion of the distal Right Vertebral artery and other severe intracranial atherosclerosis, including severe stenosis of the Right ICA siphon and Left PCA. 3. No definite acute infarct; unusual, symmetric signal changes in the bilateral cerebellar peduncles without strong  evidence of acute infarct. Chronic infarcts in the left PCA territory with laminar necrosis. Chronic right brainstem lacune at the pons midbrain junction, and tiny chronic right superior motor strip infarct. Electronically Signed   By: Genevie Ann M.D.   On: 02/21/2016 12:41   Mr Brain Wo Contrast  Result Date: 02/21/2016 CLINICAL DATA:  83 year old female with generalized weakness and recent falls. Right arm weakness. Query stroke. Initial encounter. EXAM: MRI HEAD WITHOUT CONTRAST MRA HEAD WITHOUT CONTRAST TECHNIQUE: Multiplanar, multiecho pulse sequences of the brain and surrounding structures were obtained without intravenous contrast. Angiographic images of the head were obtained using MRA technique without contrast. COMPARISON:  Head CTs without contrast 02/19/2016 and earlier. FINDINGS: MRI HEAD FINDINGS Brain: Chronic lacunar infarct along the ventral left thalamus. Small to moderate area of cortical encephalomalacia in the left occipital pole with some laminar necrosis. Small chronic lacunar infarct in the left central brainstem at the junction of the midbrain and pons (series 6, image 9). Normal signal throughout the remaining lower brainstem. There is a tiny chronic cortical infarct at the right superior frontal gyrus motor strip (series 8, image 20). Minimal for age nonspecific cerebral white matter signal changes. There are fairly symmetric signal changes in the cerebellar peduncle is with T2 and FLAIR hyperintensity (series 6 image 7). There is associated increased trace diffusion signal, but this does not appear restricted (series 4, image 11 and series 400, image 11). The cerebellum otherwise appears normal. No convincing restricted diffusion to suggest acute infarction. No midline shift, mass effect, evidence of mass lesion, ventriculomegaly, extra-axial collection or acute intracranial hemorrhage. Cervicomedullary junction and pituitary are within normal limits. Vascular: Major intracranial vascular  flow voids are remarkable for loss of the distal right vertebral artery flow void. The distal left vertebral artery appears normal, and the basilar artery flow void is maintained. Other Major intracranial vascular flow voids are preserved. Skull and upper cervical spine: Multilevel severe degenerative appearing cervical spinal stenosis and severe spinal cord mass effect (series 3, image 12). Widespread severe cervical disc space loss. Normal bone marrow signal. Sinuses/Orbits: Normal orbits aside from postoperative changes to both globes. Visualized paranasal sinuses and mastoids are stable and well pneumatized. Other: Mild mastoid effusions greater on the left. Negative nasopharynx. Grossly normal visualized other internal auditory structures. Negative scalp soft tissues. MRA HEAD FINDINGS Antegrade flow in the distal left vertebral artery which is irregular but without significant stenosis. Minimal antegrade flow signal in the distal right vertebral artery. There is probably retrograde supply to the right V4 segment and right PICA from the vertebrobasilar junction. The left PICA origin is patent. The basilar artery is irregular but without hemodynamically significant stenosis. Both SCA and PCA origins are patent although there is moderate to severe left and mild-to-moderate right widespread P1 and P2 segment irregularity with stenosis. Distal left PCA branches are attenuated. Posterior communicating arteries are diminutive or absent. Antegrade flow in both ICA siphons with severe irregularity throughout the right cavernous and supraclinoid segments. Associated  moderate or severe stenosis perhaps most pronounced at the distal cavernous segment. The right ICA terminus remains patent. The right MCA origin is normal. Left siphon antegrade flow with up to moderate irregularity in the cavernous and supraclinoid segments but only mild stenosis. Normal left ICA terminus, left MCA and ACA origins. The right A1 is  diminutive or absent. The anterior communicating artery and visualized ACA branches are within normal limits. MCA M1 segments and bifurcations are patent without stenosis. No MCA branch occlusion is identified. Mild bilateral MCA branch irregularity. IMPRESSION: 1. Widespread severe degenerative cervical spinal stenosis with severe cervical spinal cord mass effect. See series 3, image 12. 2. Chronic appearing occlusion or near-occlusion of the distal Right Vertebral artery and other severe intracranial atherosclerosis, including severe stenosis of the Right ICA siphon and Left PCA. 3. No definite acute infarct; unusual, symmetric signal changes in the bilateral cerebellar peduncles without strong evidence of acute infarct. Chronic infarcts in the left PCA territory with laminar necrosis. Chronic right brainstem lacune at the pons midbrain junction, and tiny chronic right superior motor strip infarct. Electronically Signed   By: Genevie Ann M.D.   On: 02/21/2016 12:41     Scheduled Meds: .  stroke: mapping our early stages of recovery book   Does not apply Once  . allopurinol  300 mg Oral Daily  . amiodarone  200 mg Oral Daily  . apixaban  2.5 mg Oral BID  . atorvastatin  80 mg Oral Q supper  . donepezil  10 mg Oral QHS  . feeding supplement (GLUCERNA SHAKE)  237 mL Oral BID BM  . insulin aspart  0-9 Units Subcutaneous TID PC & HS  . potassium chloride  40 mEq Oral Once   Continuous Infusions:   Marzetta Board, MD, PhD [facility] Pager [phone]  If 7PM-7AM, please contact night-coverage [email or web address] Password [email or web address] 02/21/2016, 1:25 PM

## 2016-02-22 ENCOUNTER — Observation Stay (HOSPITAL_COMMUNITY): Payer: Medicare Other

## 2016-02-22 DIAGNOSIS — I5022 Chronic systolic (congestive) heart failure: Secondary | ICD-10-CM | POA: Diagnosis not present

## 2016-02-22 DIAGNOSIS — E876 Hypokalemia: Secondary | ICD-10-CM | POA: Diagnosis not present

## 2016-02-22 DIAGNOSIS — M4802 Spinal stenosis, cervical region: Secondary | ICD-10-CM | POA: Diagnosis not present

## 2016-02-22 DIAGNOSIS — F0390 Unspecified dementia without behavioral disturbance: Secondary | ICD-10-CM | POA: Diagnosis not present

## 2016-02-22 DIAGNOSIS — Z950 Presence of cardiac pacemaker: Secondary | ICD-10-CM | POA: Diagnosis not present

## 2016-02-22 DIAGNOSIS — Z794 Long term (current) use of insulin: Secondary | ICD-10-CM | POA: Diagnosis not present

## 2016-02-22 DIAGNOSIS — M25511 Pain in right shoulder: Secondary | ICD-10-CM | POA: Diagnosis not present

## 2016-02-22 DIAGNOSIS — G9389 Other specified disorders of brain: Secondary | ICD-10-CM | POA: Diagnosis not present

## 2016-02-22 DIAGNOSIS — S199XXA Unspecified injury of neck, initial encounter: Secondary | ICD-10-CM | POA: Diagnosis not present

## 2016-02-22 DIAGNOSIS — Z923 Personal history of irradiation: Secondary | ICD-10-CM | POA: Diagnosis not present

## 2016-02-22 DIAGNOSIS — I130 Hypertensive heart and chronic kidney disease with heart failure and stage 1 through stage 4 chronic kidney disease, or unspecified chronic kidney disease: Secondary | ICD-10-CM | POA: Diagnosis not present

## 2016-02-22 DIAGNOSIS — I6523 Occlusion and stenosis of bilateral carotid arteries: Secondary | ICD-10-CM | POA: Diagnosis not present

## 2016-02-22 DIAGNOSIS — I480 Paroxysmal atrial fibrillation: Secondary | ICD-10-CM | POA: Diagnosis not present

## 2016-02-22 DIAGNOSIS — I700 Atherosclerosis of aorta: Secondary | ICD-10-CM | POA: Diagnosis not present

## 2016-02-22 DIAGNOSIS — E785 Hyperlipidemia, unspecified: Secondary | ICD-10-CM | POA: Diagnosis not present

## 2016-02-22 DIAGNOSIS — R531 Weakness: Secondary | ICD-10-CM | POA: Diagnosis not present

## 2016-02-22 DIAGNOSIS — Z853 Personal history of malignant neoplasm of breast: Secondary | ICD-10-CM | POA: Diagnosis not present

## 2016-02-22 DIAGNOSIS — N183 Chronic kidney disease, stage 3 (moderate): Secondary | ICD-10-CM | POA: Diagnosis not present

## 2016-02-22 DIAGNOSIS — I10 Essential (primary) hypertension: Secondary | ICD-10-CM | POA: Diagnosis not present

## 2016-02-22 DIAGNOSIS — E1121 Type 2 diabetes mellitus with diabetic nephropathy: Secondary | ICD-10-CM | POA: Diagnosis not present

## 2016-02-22 DIAGNOSIS — Z9221 Personal history of antineoplastic chemotherapy: Secondary | ICD-10-CM | POA: Diagnosis not present

## 2016-02-22 DIAGNOSIS — G959 Disease of spinal cord, unspecified: Secondary | ICD-10-CM | POA: Diagnosis not present

## 2016-02-22 DIAGNOSIS — M1990 Unspecified osteoarthritis, unspecified site: Secondary | ICD-10-CM | POA: Diagnosis not present

## 2016-02-22 DIAGNOSIS — R296 Repeated falls: Secondary | ICD-10-CM | POA: Diagnosis not present

## 2016-02-22 DIAGNOSIS — N189 Chronic kidney disease, unspecified: Secondary | ICD-10-CM | POA: Diagnosis not present

## 2016-02-22 DIAGNOSIS — I672 Cerebral atherosclerosis: Secondary | ICD-10-CM | POA: Diagnosis not present

## 2016-02-22 DIAGNOSIS — E1122 Type 2 diabetes mellitus with diabetic chronic kidney disease: Secondary | ICD-10-CM | POA: Diagnosis not present

## 2016-02-22 LAB — GLUCOSE, CAPILLARY
GLUCOSE-CAPILLARY: 105 mg/dL — AB (ref 65–99)
GLUCOSE-CAPILLARY: 198 mg/dL — AB (ref 65–99)
Glucose-Capillary: 173 mg/dL — ABNORMAL HIGH (ref 65–99)
Glucose-Capillary: 247 mg/dL — ABNORMAL HIGH (ref 65–99)

## 2016-02-22 LAB — VAS US CAROTID
LCCADSYS: -71 cm/s
LEFT ECA DIAS: -3 cm/s
LEFT VERTEBRAL DIAS: 14 cm/s
Left CCA dist dias: -8 cm/s
Left CCA prox dias: 10 cm/s
Left CCA prox sys: 74 cm/s
Left ICA dist dias: -18 cm/s
Left ICA dist sys: -91 cm/s
Left ICA prox dias: -14 cm/s
Left ICA prox sys: -65 cm/s
RCCADSYS: -43 cm/s
RCCAPDIAS: -4 cm/s
RCCAPSYS: -57 cm/s
RIGHT ECA DIAS: -4 cm/s
RIGHT VERTEBRAL DIAS: -2 cm/s

## 2016-02-22 LAB — HEMOGLOBIN A1C
Hgb A1c MFr Bld: 7.3 % — ABNORMAL HIGH (ref 4.8–5.6)
Mean Plasma Glucose: 163 mg/dL

## 2016-02-22 LAB — ECHOCARDIOGRAM COMPLETE

## 2016-02-22 LAB — VITAMIN B1: VITAMIN B1 (THIAMINE): 164 nmol/L (ref 66.5–200.0)

## 2016-02-22 NOTE — Evaluation (Signed)
Speech Language Pathology Evaluation Patient Details Name: Allison Dunn MRN: [id number] DOB: 12-13-1931 Today's Date: 02/22/2016 Time: 6195-0932 SLP Time Calculation (min) (ACUTE ONLY): 15 min  Problem List:  Patient Active Problem List   Diagnosis Date Noted  . Myelopathy ([place])   . Weakness 02/19/2016  . Diaphragmatic injury as surgical complication 67/01/4579  . Postoperative anemia due to acute blood loss 08/14/2015  . Dementia without behavioral disturbance 08/14/2015  . Chronic UTI 07/29/2015  . 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 Klebsiella sepsis ([place]) 05/02/2015  . CAP (community acquired pneumonia) 05/02/2015  . Transaminasemia   . Sepsis ([place]) 04/22/2015  . Paroxysmal atrial fibrillation ([place]) 04/22/2015  . Delirium   . Urinary tract infectious disease   . Osteoarthritis 03/27/2015  . Cerebrovascular accident (CVA) due to thrombosis of cerebral artery ([place]) 03/23/2015  . Moderate tricuspid regurgitation 03/11/2015  . Cardiomyopathy ([place]), EF 40-45%  02/2015 03/11/2015  . Mild mitral regurgitation 03/11/2015  . Pulmonary arterial hypertension ([place]), mild 03/11/2015  . Mild cognitive impairment 02/24/2015  . Type 2 diabetes mellitus without complication, with long-term current use of insulin ([place]) 02/24/2015  . Hypertensive heart disease with CHF (congestive heart failure) ([place]) 02/24/2015  . Essential hypertension, benign 02/21/2015  . Hyperlipidemia 02/19/2015  . Gout 02/19/2015  . PAD (peripheral artery disease) ([place]) 02/19/2015  . Cardiac pacemaker in situ 02/19/2015  . CKD (chronic kidney disease) 02/19/2015   Past Medical History:  Past Medical History:  Diagnosis Date  . Arthritis   . Cancer [facility])    breast, left; s/p lumpectomy/chemo/radiation  . Depression   . Diabetes mellitus without complication ([place])   . Hyperlipidemia   . Hypertension   . Paroxysmal atrial fibrillation (HCC)   . Presence of permanent cardiac pacemaker   . Renal  disorder    Past Surgical History:  Past Surgical History:  Procedure Laterality Date  . BREAST LUMPECTOMY Left   . CARDIAC PACEMAKER PLACEMENT    . CATARACT EXTRACTION    . EYE SURGERY    . kidney stone removal     x3  . LAPAROSCOPIC NEPHRECTOMY Left 07/29/2015   Procedure: LEFT LAPAROSCOPIC SIMPLE NEPHRECTOMY;  Surgeon: Ardis Hughs, MD;  Location: [facility];  Service: Urology;  Laterality: Left;  . LUMBAR SPINE SURGERY     laminectomy and rod   HPI:  Patient is a 83 y/o female with hx of cognitive impairment, SSS, pacemaker, HTN, A-fib, DM, depression presents with weakness. Head CT suggestive of left occipital low density concerning for infarction. Cannot do MRI due to pacemaker.   Assessment / Plan / Recommendation Clinical Impression  Pt states she has difficulty with memory. She moved to [place] to reside with sister. Pt reports she can no longer organize and safely take her meds independently and sister is responsible for finances and medication management. Difficulty with temporal and situational orientation and age. Pt exhibits cognitive impairments however she has a good support system at home and necessary assist. Pt's sister present and agrees. No further ST needed.      SLP Assessment  Patient does not need any further Speech Lanaguage Pathology Services    Follow Up Recommendations       Frequency and Duration           SLP Evaluation Cognition  Overall Cognitive Status: History of cognitive impairments - at baseline (close to baseline per sister) Arousal/Alertness: Awake/alert Orientation Level: Oriented to person;Oriented to place;Disoriented to time (not oriented to age) Attention: Sustained Sustained Attention: Appears  intact Memory: Impaired Awareness: Impaired Awareness Impairment: Anticipatory impairment Problem Solving: Appears intact (for verbal) Safety/Judgment: Impaired       Comprehension  Auditory Comprehension Overall Auditory  Comprehension: Appears within functional limits for tasks assessed Visual Recognition/Discrimination Discrimination: Not tested Reading Comprehension Reading Status: Not tested    Expression Expression Primary Mode of Expression: Verbal Verbal Expression Overall Verbal Expression: Appears within functional limits for tasks assessed Initiation: No impairment Level of Generative/Spontaneous Verbalization: Conversation Repetition:  (NT) Naming: Not tested Pragmatics: No impairment Written Expression Dominant Hand: Right Written Expression: Not tested   Oral / Motor  Oral Motor/Sensory Function Overall Oral Motor/Sensory Function: Within functional limits Motor Speech Overall Motor Speech: Appears within functional limits for tasks assessed Respiration: Within functional limits Phonation: Normal Resonance: Within functional limits Articulation: Within functional limitis Intelligibility: Intelligible Motor Planning: Witnin functional limits   GO                    [name] 02/22/2016, 3:29 PM  [name] Eriyonna Matsushita M.Ed [employer] [phone]

## 2016-02-22 NOTE — Progress Notes (Signed)
Occupational Therapy Treatment Patient Details Name: Allison Dunn MRN: [id number] DOB: 1932-01-08 Today's Date: 02/22/2016    History of present illness Patient is a 83 y/o female with hx of cognitive impairment, SSS, pacemaker, HTN, A-fib, DM, depression presents with weakness. Head CT suggestive of left occipital low density concerning for infarction. Cannot do MRI due to pacemaker.   OT comments  Pt sitting upright in bed eating dinner. No complaints of dizziness. Pt reclined to supine and moved into sitting EOB and complained of room spinning. Unsure if +nystagmus. Pt unable to report if loss of R shoulder AROM is new. Pt unable to lift arm greater than 30 degrees FF, unable to hold arm in abduction. ER 3/5. Pt is tender when palpating anterior shoulder. Question if pt has RTC deficit. Elbow/wrist/hand strength @ 4/5 throughout without complaints of sensory deficits. Recommend PT Vestibular evalution to rule out BPPV. Pt will need 24/7 S and S for all mobility and ADL for safe DC home with [place]. Will continue to follow acutely.l  Follow Up Recommendations  Home health OT;Supervision/Assistance - 24 hour  HOme health Aide   Equipment Recommendations  None recommended by OT    Recommendations for Other Services Other (comment) (PT Vestibular evaluation)    Precautions / Restrictions Precautions Precautions: Fall       Mobility Bed Mobility Overal bed mobility: Needs Assistance Bed Mobility: Supine to Sit;Sit to Supine     Supine to sit: Supervision Sit to supine: Supervision      Transfers                      Balance                                   ADL Overall ADL's : Needs assistance/impaired Eating/Feeding: Set up;Sitting   Grooming: Set up;Sitting   Upper Body Bathing: Minimal assistance   Lower Body Bathing: Moderate assistance;Sit to/from stand                         General ADL Comments: Pt transitioned from supine  to sit and became dizzy feeling as if room is "spinning". did not ambulate at this time.      Vision                     Perception     Praxis      Cognition   Behavior During Therapy: [facility] for tasks assessed/performed Overall Cognitive Status: History of cognitive impairments - at baseline                  General Comments: Pt unable to reports if symptoms are new or were present before admission.  Pt is a poor historian.      Extremity/Trunk Assessment               Exercises Other Exercises Other Exercises: R shoulder A/AA/PROM. Taught self ROM   Shoulder Instructions       General Comments      Pertinent Vitals/ Pain       Pain Assessment: No/denies pain  Home Living     Available Help at Discharge: Family Type of Home: House                              Lives With:  Family (sister)    Prior Functioning/Environment              Frequency  Min 2X/week        Progress Toward Goals  OT Goals(current goals can now be found in the care plan section)  Progress towards OT goals: Progressing toward goals  Acute Rehab OT Goals Patient Stated Goal: to get better OT Goal Formulation: With patient Time For Goal Achievement: 03/05/16 Potential to Achieve Goals: Good ADL Goals Pt Will Perform Grooming: with supervision;standing Pt Will Perform Upper Body Bathing: with set-up;sitting;with supervision Pt Will Perform Lower Body Bathing: with supervision;sit to/from stand Pt Will Transfer to Toilet: with supervision;ambulating;regular height toilet Pt Will Perform Toileting - Clothing Manipulation and hygiene: with supervision;sit to/from stand  Plan Discharge plan remains appropriate    Co-evaluation                 End of Session     Activity Tolerance Patient tolerated treatment well   Patient Left in bed;with call bell/phone within reach;with bed alarm set   Nurse Communication Mobility status         Time: 1730-1745 OT Time Calculation (min): 15 min  Charges: OT General Charges $OT Visit: 1 Procedure OT Treatments $Self Care/Home Management : 8-22 mins  Tonja Jezewski,HILLARY 02/22/2016, 5:54 PM  [facility], OT/L  [phone] 02/22/2016

## 2016-02-22 NOTE — Progress Notes (Addendum)
PROGRESS NOTE  Allison Dunn NGE:[id number] DOB: [birth year] DOA: 02/19/2016 PCP: Binnie Rail, MD   LOS: 0 days   Brief Narrative: Allison Dunn is a 83 y.o. female with a medical history of diabetes, hypertension, breast cancer, presented to the emergency department with complaints of weakness  Assessment & Plan: Active Problems:   Hyperlipidemia   CKD (chronic kidney disease)   Essential hypertension, benign   Type 2 diabetes mellitus without complication, with long-term current use of insulin (HCC)   Cerebrovascular accident (CVA) due to thrombosis of cerebral artery (HCC)   Paroxysmal atrial fibrillation (HCC)   Dementia without behavioral disturbance   Weakness   Myelopathy (HCC)   Generalized weakness, rule out CVA - CT head showed no full left development of left occipital low density concerning for infarction of indeterminate age. - Neurology consulted and appreciated - Allison Dunn d/w [employer] representative on admission, patient has an MRI compatible PPM. For some reason, there is a note from radiology that the PPM is not MRI compatible based on chest x-ray appearance. I reviewed the PPM again today on [email or web address], as well as discussed with on-call electrophysiology, the PPM appears compatible, and chest x-ray is not ideal to determine compatibility of a pacemaker - PT/OT/Speech consulted - MRI finally obtained 1/1 and showed no acute CVA, but did show widespread severe degenerative cervical spinal stenosis with severe cervical spinal cord mass effect.   Degenerative C spine changes  - as evidenced by MRI, d/w Dr. Cyndy Freeze from neurosurgery, no acute interventions needed, recommended PT/OT and follow up with him in 2 weeks in office  Mildly elevated troponin - Troponin 0.05, flat, not in a pattern consistent with ACS  Chronic systolic CHF - repeat 2D echo with EF 20-25% from 40% at the beginning of the year. She appears euvolemic, is on ACEI at home,  continue. Will need follow up with cardiology in few weeks as an outpatient. No chest pain / telemetry unremarkable.   Hypokalemia - Will replace and continue to monitor  Diabetes mellitus, type II - Hold lispro - SSI  Essential hypertension - stable  Paroxysmal atrial fibrillation - Continue Eliquis, amiodarone - Currently atrially paced  Dementia  - Continue Aricept   Chronic kidney disease, stage III - Creatinine appears to be stable, continue to monitor BMP   DVT prophylaxis: Eliquis Code Status: Full Family Communication: no family bedside Disposition Plan: home 1 day  Consultants:   Neurology   Procedures:   2D echo  Antimicrobials:  None    Subjective: - no chest pain, shortness of breath, no abdominal pain, nausea or vomiting. Feels overall weak  Objective: Vitals:   02/21/16 2126 02/22/16 0128 02/22/16 0528 02/22/16 1013  BP: (!) 120/59 (!) 123/49 (!) 138/52 (!) 149/63  Pulse: 60 (!) 58 60 (!) 59  Resp: '18 18 18 20  '$ Temp: 98.6 F (37 C) 98.7 F (37.1 C) 98.1 F (36.7 C) 97.9 F (36.6 C)  TempSrc: Oral Oral Oral Oral  SpO2: 96% 99% 98% 99%    Intake/Output Summary (Last 24 hours) at 02/22/16 1116 Last data filed at 02/22/16 0828  Gross per 24 hour  Intake              600 ml  Output                0 ml  Net              600 ml   There were no vitals  filed for this visit.  Examination: Constitutional: NAD Vitals:   02/21/16 2126 02/22/16 0128 02/22/16 0528 02/22/16 1013  BP: (!) 120/59 (!) 123/49 (!) 138/52 (!) 149/63  Pulse: 60 (!) 58 60 (!) 59  Resp: '18 18 18 20  '$ Temp: 98.6 F (37 C) 98.7 F (37.1 C) 98.1 F (36.7 C) 97.9 F (36.6 C)  TempSrc: Oral Oral Oral Oral  SpO2: 96% 99% 98% 99%   Eyes: PERRL, lids and conjunctivae normal Respiratory: clear to auscultation bilaterally, no wheezing, no crackles. Cardiovascular: Regular rate and rhythm, no murmurs / rubs / gallops. Abdomen: no tenderness. Bowel sounds positive.    Musculoskeletal: no clubbing / cyanosis.  Neurologic: no focal findings   Data Reviewed: I have personally reviewed following labs and imaging studies  CBC:  Recent Labs Lab 02/19/16 1318  WBC 8.6  HGB 14.2  HCT 41.7  MCV 90.7  PLT 673   Basic Metabolic Panel:  Recent Labs Lab 02/19/16 1318 02/19/16 1633 02/21/16 0449  NA 136  --  137  K 3.2*  --  3.3*  CL 97*  --  98*  CO2 30  --  32  GLUCOSE 183*  --  132*  BUN 26*  --  21*  CREATININE 1.21*  --  1.12*  CALCIUM 9.1  --  9.0  MG  --  1.8  --    GFR: CrCl cannot be calculated (Unknown ideal weight.). Liver Function Tests: No results for input(s): AST, ALT, ALKPHOS, BILITOT, PROT, ALBUMIN in the last 168 hours. No results for input(s): LIPASE, AMYLASE in the last 168 hours. No results for input(s): AMMONIA in the last 168 hours. Coagulation Profile: No results for input(s): INR, PROTIME in the last 168 hours. Cardiac Enzymes:  Recent Labs Lab 02/19/16 1320 02/19/16 1827 02/20/16 0010 02/20/16 0440  TROPONINI 0.05* 0.05* 0.05* 0.05*   BNP (last 3 results) No results for input(s): PROBNP in the last 8760 hours. HbA1C:  Recent Labs  02/20/16 0440  HGBA1C 7.3*   CBG:  Recent Labs Lab 02/21/16 0625 02/21/16 1101 02/21/16 1643 02/21/16 2132 02/22/16 0623  GLUCAP 129* 255* 131* 117* 105*   Lipid Profile:  Recent Labs  02/20/16 0440  CHOL 222*  HDL 74  LDLCALC 133*  TRIG 74  CHOLHDL 3.0   Thyroid Function Tests:  Recent Labs  02/19/16 1827  TSH 1.092   Anemia Panel:  Recent Labs  02/19/16 1827  VITAMINB12 1,998*  FOLATE 26.8   Urine analysis:    Component Value Date/Time   COLORURINE YELLOW 02/19/2016 1330   APPEARANCEUR CLEAR 02/19/2016 1330   LABSPEC 1.009 02/19/2016 1330   PHURINE 7.0 02/19/2016 1330   GLUCOSEU NEGATIVE 02/19/2016 1330   HGBUR SMALL (A) 02/19/2016 1330   BILIRUBINUR NEGATIVE 02/19/2016 1330   KETONESUR NEGATIVE 02/19/2016 1330   PROTEINUR  NEGATIVE 02/19/2016 1330   NITRITE NEGATIVE 02/19/2016 1330   LEUKOCYTESUR SMALL (A) 02/19/2016 1330   Sepsis Labs: Invalid input(s): PROCALCITONIN, LACTICIDVEN  No results found for this or any previous visit (from the past 240 hour(s)).    Radiology Studies: Mr Virgel Paling AL Contrast  Result Date: 02/21/2016 CLINICAL DATA:  83 year old female with generalized weakness and recent falls. Right arm weakness. Query stroke. Initial encounter. EXAM: MRI HEAD WITHOUT CONTRAST MRA HEAD WITHOUT CONTRAST TECHNIQUE: Multiplanar, multiecho pulse sequences of the brain and surrounding structures were obtained without intravenous contrast. Angiographic images of the head were obtained using MRA technique without contrast. COMPARISON:  Head CTs without contrast  02/19/2016 and earlier. FINDINGS: MRI HEAD FINDINGS Brain: Chronic lacunar infarct along the ventral left thalamus. Small to moderate area of cortical encephalomalacia in the left occipital pole with some laminar necrosis. Small chronic lacunar infarct in the left central brainstem at the junction of the midbrain and pons (series 6, image 9). Normal signal throughout the remaining lower brainstem. There is a tiny chronic cortical infarct at the right superior frontal gyrus motor strip (series 8, image 20). Minimal for age nonspecific cerebral white matter signal changes. There are fairly symmetric signal changes in the cerebellar peduncle is with T2 and FLAIR hyperintensity (series 6 image 7). There is associated increased trace diffusion signal, but this does not appear restricted (series 4, image 11 and series 400, image 11). The cerebellum otherwise appears normal. No convincing restricted diffusion to suggest acute infarction. No midline shift, mass effect, evidence of mass lesion, ventriculomegaly, extra-axial collection or acute intracranial hemorrhage. Cervicomedullary junction and pituitary are within normal limits. Vascular: Major intracranial vascular  flow voids are remarkable for loss of the distal right vertebral artery flow void. The distal left vertebral artery appears normal, and the basilar artery flow void is maintained. Other Major intracranial vascular flow voids are preserved. Skull and upper cervical spine: Multilevel severe degenerative appearing cervical spinal stenosis and severe spinal cord mass effect (series 3, image 12). Widespread severe cervical disc space loss. Normal bone marrow signal. Sinuses/Orbits: Normal orbits aside from postoperative changes to both globes. Visualized paranasal sinuses and mastoids are stable and well pneumatized. Other: Mild mastoid effusions greater on the left. Negative nasopharynx. Grossly normal visualized other internal auditory structures. Negative scalp soft tissues. MRA HEAD FINDINGS Antegrade flow in the distal left vertebral artery which is irregular but without significant stenosis. Minimal antegrade flow signal in the distal right vertebral artery. There is probably retrograde supply to the right V4 segment and right PICA from the vertebrobasilar junction. The left PICA origin is patent. The basilar artery is irregular but without hemodynamically significant stenosis. Both SCA and PCA origins are patent although there is moderate to severe left and mild-to-moderate right widespread P1 and P2 segment irregularity with stenosis. Distal left PCA branches are attenuated. Posterior communicating arteries are diminutive or absent. Antegrade flow in both ICA siphons with severe irregularity throughout the right cavernous and supraclinoid segments. Associated moderate or severe stenosis perhaps most pronounced at the distal cavernous segment. The right ICA terminus remains patent. The right MCA origin is normal. Left siphon antegrade flow with up to moderate irregularity in the cavernous and supraclinoid segments but only mild stenosis. Normal left ICA terminus, left MCA and ACA origins. The right A1 is  diminutive or absent. The anterior communicating artery and visualized ACA branches are within normal limits. MCA M1 segments and bifurcations are patent without stenosis. No MCA branch occlusion is identified. Mild bilateral MCA branch irregularity. IMPRESSION: 1. Widespread severe degenerative cervical spinal stenosis with severe cervical spinal cord mass effect. See series 3, image 12. 2. Chronic appearing occlusion or near-occlusion of the distal Right Vertebral artery and other severe intracranial atherosclerosis, including severe stenosis of the Right ICA siphon and Left PCA. 3. No definite acute infarct; unusual, symmetric signal changes in the bilateral cerebellar peduncles without strong evidence of acute infarct. Chronic infarcts in the left PCA territory with laminar necrosis. Chronic right brainstem lacune at the pons midbrain junction, and tiny chronic right superior motor strip infarct. Electronically Signed   By: Genevie Ann M.D.   On: 02/21/2016 12:41  Mr Brain 31 Contrast  Result Date: 02/21/2016 CLINICAL DATA:  83 year old female with generalized weakness and recent falls. Right arm weakness. Query stroke. Initial encounter. EXAM: MRI HEAD WITHOUT CONTRAST MRA HEAD WITHOUT CONTRAST TECHNIQUE: Multiplanar, multiecho pulse sequences of the brain and surrounding structures were obtained without intravenous contrast. Angiographic images of the head were obtained using MRA technique without contrast. COMPARISON:  Head CTs without contrast 02/19/2016 and earlier. FINDINGS: MRI HEAD FINDINGS Brain: Chronic lacunar infarct along the ventral left thalamus. Small to moderate area of cortical encephalomalacia in the left occipital pole with some laminar necrosis. Small chronic lacunar infarct in the left central brainstem at the junction of the midbrain and pons (series 6, image 9). Normal signal throughout the remaining lower brainstem. There is a tiny chronic cortical infarct at the right superior frontal  gyrus motor strip (series 8, image 20). Minimal for age nonspecific cerebral white matter signal changes. There are fairly symmetric signal changes in the cerebellar peduncle is with T2 and FLAIR hyperintensity (series 6 image 7). There is associated increased trace diffusion signal, but this does not appear restricted (series 4, image 11 and series 400, image 11). The cerebellum otherwise appears normal. No convincing restricted diffusion to suggest acute infarction. No midline shift, mass effect, evidence of mass lesion, ventriculomegaly, extra-axial collection or acute intracranial hemorrhage. Cervicomedullary junction and pituitary are within normal limits. Vascular: Major intracranial vascular flow voids are remarkable for loss of the distal right vertebral artery flow void. The distal left vertebral artery appears normal, and the basilar artery flow void is maintained. Other Major intracranial vascular flow voids are preserved. Skull and upper cervical spine: Multilevel severe degenerative appearing cervical spinal stenosis and severe spinal cord mass effect (series 3, image 12). Widespread severe cervical disc space loss. Normal bone marrow signal. Sinuses/Orbits: Normal orbits aside from postoperative changes to both globes. Visualized paranasal sinuses and mastoids are stable and well pneumatized. Other: Mild mastoid effusions greater on the left. Negative nasopharynx. Grossly normal visualized other internal auditory structures. Negative scalp soft tissues. MRA HEAD FINDINGS Antegrade flow in the distal left vertebral artery which is irregular but without significant stenosis. Minimal antegrade flow signal in the distal right vertebral artery. There is probably retrograde supply to the right V4 segment and right PICA from the vertebrobasilar junction. The left PICA origin is patent. The basilar artery is irregular but without hemodynamically significant stenosis. Both SCA and PCA origins are patent  although there is moderate to severe left and mild-to-moderate right widespread P1 and P2 segment irregularity with stenosis. Distal left PCA branches are attenuated. Posterior communicating arteries are diminutive or absent. Antegrade flow in both ICA siphons with severe irregularity throughout the right cavernous and supraclinoid segments. Associated moderate or severe stenosis perhaps most pronounced at the distal cavernous segment. The right ICA terminus remains patent. The right MCA origin is normal. Left siphon antegrade flow with up to moderate irregularity in the cavernous and supraclinoid segments but only mild stenosis. Normal left ICA terminus, left MCA and ACA origins. The right A1 is diminutive or absent. The anterior communicating artery and visualized ACA branches are within normal limits. MCA M1 segments and bifurcations are patent without stenosis. No MCA branch occlusion is identified. Mild bilateral MCA branch irregularity. IMPRESSION: 1. Widespread severe degenerative cervical spinal stenosis with severe cervical spinal cord mass effect. See series 3, image 12. 2. Chronic appearing occlusion or near-occlusion of the distal Right Vertebral artery and other severe intracranial atherosclerosis, including severe stenosis of  the Right ICA siphon and Left PCA. 3. No definite acute infarct; unusual, symmetric signal changes in the bilateral cerebellar peduncles without strong evidence of acute infarct. Chronic infarcts in the left PCA territory with laminar necrosis. Chronic right brainstem lacune at the pons midbrain junction, and tiny chronic right superior motor strip infarct. Electronically Signed   By: Genevie Ann M.D.   On: 02/21/2016 12:41     Scheduled Meds: .  stroke: mapping our early stages of recovery book   Does not apply Once  . allopurinol  300 mg Oral Daily  . amiodarone  200 mg Oral Daily  . apixaban  2.5 mg Oral BID  . atorvastatin  40 mg Oral Q supper  . donepezil  10 mg Oral  QHS  . feeding supplement (GLUCERNA SHAKE)  237 mL Oral BID BM  . insulin aspart  0-9 Units Subcutaneous TID PC & HS  . potassium chloride  40 mEq Oral Once   Continuous Infusions:   Marzetta Board, MD, PhD [facility] Pager [phone]  If 7PM-7AM, please contact night-coverage [email or web address] Password [email or web address] 02/22/2016, 11:16 AM

## 2016-02-22 NOTE — Care Management Note (Signed)
Case Management Note  Patient Details  Name: Allison Dunn MRN: [id number] Date of Birth: [birth year]  Subjective/Objective:    Pt in with weakness. Spinal stenosis on MRI. She is from home with her sister.              Action/Plan: Awaiting MRI of C spine. PT/OT recommending [place] services. CM following for d/c needs.   Expected Discharge Date:                  Expected Discharge Plan:  [place]  In-House Referral:     Discharge planning Services     Post Acute Care Choice:    Choice offered to:     DME Arranged:    DME Agency:     HH Arranged:    [place] Agency:     Status of Service:  In process, will continue to follow  If discussed at Long Length of Stay Meetings, dates discussed:    Additional Comments:  Pollie Friar, RN 02/22/2016, 11:15 AM

## 2016-02-22 NOTE — Progress Notes (Signed)
Physical Therapy Treatment Patient Details Name: Allison Dunn MRN: [id number] DOB: [birth year] Today's Date: 02/22/2016    History of Present Illness Patient is a 83 y/o female with hx of cognitive impairment, SSS, pacemaker, HTN, A-fib, DM, depression presents with weakness. Head CT suggestive of left occipital low density concerning for infarction. Cannot do MRI due to pacemaker.    PT Comments    Pt performed increased mobility from previous session but remains limited due to dizziness.  BP obtained and patient is not orthostatic.  Pt reports blurred vision on R and presents with R UE weakness and coordination deficits.  Pt would continue to benefit from skilled rehab during hospitalization and at d/c to improve strength and function.  Still no MRI due to pacemaker.   Follow Up Recommendations  Home health PT;Supervision for mobility/OOB;Supervision/Assistance - 24 hour     Equipment Recommendations  None recommended by PT    Recommendations for Other Services       Precautions / Restrictions Precautions Precautions: Fall Restrictions Weight Bearing Restrictions: No    Mobility  Bed Mobility               General bed mobility comments: Pt OOB in recliner on arrival.    Transfers Overall transfer level: Needs assistance Equipment used: Rolling walker (2 wheeled) Transfers: Sit to/from Stand Sit to Stand: Min guard         General transfer comment: Poor hand placement to and from seated surface.  Cues to correct and improve safety.    Ambulation/Gait Ambulation/Gait assistance: Min guard Ambulation Distance (Feet): 6 Feet (x2 trials.  From chair to bed and back to chair.  Pt unable to progress gait distance due to dizziness.  ) Assistive device: Rolling walker (2 wheeled) Gait Pattern/deviations: Step-through pattern;Decreased stride length;Trunk flexed Gait velocity: decreased Gait velocity interpretation: Below normal speed for age/gender General Gait  Details: Slow, unsteady gait wtih forward flexed trunk. Reports feeling "swimmy headed" BP in sitting 153/56, and in standing 172/72.  Reports blurriness in R eye.     Stairs            Wheelchair Mobility    Modified Rankin (Stroke Patients Only)       Balance     Sitting balance-Leahy Scale: Good       Standing balance-Leahy Scale: Fair Standing balance comment: Heavy reliance on RW remains.  Placed eye patch on R eye and patient reports she can see clearer but after several seconds of standing reports dizziness and returns to seated position.                      Cognition Arousal/Alertness: Awake/alert Behavior During Therapy: WFL for tasks assessed/performed Overall Cognitive Status: History of cognitive impairments - at baseline (History of Dementia.  )                 General Comments: Pt unable to reports if symptoms are new or were present before admission.  Pt is a poor historian.      Exercises General Exercises - Lower Extremity Long Arc Quad: AROM;Both;10 reps;Seated Hip ABduction/ADduction: AROM;Both;10 reps;Seated Hip Flexion/Marching: AROM;Both;10 reps;Seated Mini-Sqauts: AROM;Both;10 reps;Seated;Standing (2x5 reps of chair push-ups to encourage weight bearing.  )    General Comments        Pertinent Vitals/Pain Pain Assessment: No/denies pain    Home Living  Prior Function            PT Goals (current goals can now be found in the care plan section) Acute Rehab PT Goals Patient Stated Goal: none stated Potential to Achieve Goals: Fair Progress towards PT goals: Progressing toward goals    Frequency    Min 4X/week      PT Plan Current plan remains appropriate    Co-evaluation             End of Session Equipment Utilized During Treatment: Gait belt Activity Tolerance: Patient tolerated treatment well Patient left: in chair;with call bell/phone within reach;with chair alarm set      Time: 6967-8938 PT Time Calculation (min) (ACUTE ONLY): 38 min  Charges:  $Therapeutic Exercise: 8-22 mins $Therapeutic Activity: 23-37 mins                    G Codes:      Cristela Blue 22-Mar-2016, 10:23 AM  Governor Rooks, PTA pager [phone]

## 2016-02-22 NOTE — Care Management Obs Status (Signed)
[facility] NOTIFICATION   Patient Details  Name: Allison Dunn MRN: [id number] Date of Birth: January 14, 1932   Medicare Observation Status Notification Given:  Yes    Pollie Friar, RN 02/22/2016, 3:44 PM

## 2016-02-22 NOTE — Progress Notes (Signed)
STROKE TEAM PROGRESS NOTE   SUBJECTIVE (INTERVAL HISTORY) Sister is at bedside. Pt just had MRI C spine and sitting comfortably in bed. She still complains of right arm weakness. She denies any facial droop or leg weakness. Denies neck pain, no bowel bladder difficulty.    OBJECTIVE Temp:  [97.9 F (36.6 C)-98.8 F (37.1 C)] 97.9 F (36.6 C) (01/02 1013) Pulse Rate:  [58-60] 59 (01/02 1013) Cardiac Rhythm: Atrial paced;Heart block (01/02 0700) Resp:  [18-20] 20 (01/02 1013) BP: (120-155)/(49-63) 149/63 (01/02 1013) SpO2:  [96 %-99 %] 98 % (01/02 1227)  CBC:   Recent Labs Lab 02/19/16 1318  WBC 8.6  HGB 14.2  HCT 41.7  MCV 90.7  PLT 086    Basic Metabolic Panel:   Recent Labs Lab 02/19/16 1318 02/19/16 1633 02/21/16 0449  NA 136  --  137  K 3.2*  --  3.3*  CL 97*  --  98*  CO2 30  --  32  GLUCOSE 183*  --  132*  BUN 26*  --  21*  CREATININE 1.21*  --  1.12*  CALCIUM 9.1  --  9.0  MG  --  1.8  --     Lipid Panel:     Component Value Date/Time   CHOL 222 (H) 02/20/2016 0440   TRIG 74 02/20/2016 0440   HDL 74 02/20/2016 0440   CHOLHDL 3.0 02/20/2016 0440   VLDL 15 02/20/2016 0440   LDLCALC 133 (H) 02/20/2016 0440   HgbA1c:  Lab Results  Component Value Date   HGBA1C 7.3 (H) 02/20/2016   Urine Drug Screen: No results found for: LABOPIA, COCAINSCRNUR, LABBENZ, AMPHETMU, THCU, LABBARB    IMAGING I have personally reviewed the radiological images below and agree with the radiology interpretations.  Dg Chest 2 View 02/19/2016 No active cardiopulmonary disease.  Aortic atherosclerosis.   Ct Head Wo Contrast 02/19/2016 Interval development of left occipital low density concerning for infarction of indeterminate age. MRI is recommended for further evaluation.   Mri and Mra Head Wo Contrast 02/21/2016 Widespread severe degenerative cervical spinal stenosis with severe cervical spinal cord mass effect. See series 3, image 12. 2. Chronic appearing  occlusion or near-occlusion of the distal Right Vertebral artery and other severe intracranial atherosclerosis, including severe stenosis of the Right ICA siphon and Left PCA. 3. No definite acute infarct; unusual, symmetric signal changes in the bilateral cerebellar peduncles without strong evidence of acute infarct. Chronic infarcts in the left PCA territory with laminar necrosis. Chronic right brainstem lacune at the pons midbrain junction, and tiny chronic right superior motor strip infarct.    Carotid Doppler - 1-39% ICA plaquing. Vertebral artery flow is antegrade.   TTE  Left ventricle: The cavity size was normal. Wall thickness was   increased in a pattern of moderate LVH. Systolic function was   severely reduced. The estimated ejection fraction was in the   range of 20% to 25%. Akinesis of the mid-apicalanterior   myocardium. - Mitral valve: There was moderate to severe regurgitation. - Left atrium: The atrium was moderately dilated. - Pulmonary arteries: Systolic pressure was mildly increased. PA   peak pressure: 34 mm Hg (S).  MRI C-spine  Increased T2 cord signal is present from C3 through the C5. 1. Severe central canal stenosis at C2-3, C3-4, and particularly C4-5. 2. Severe left and moderate right foraminal stenosis at C2-3. 3. Severe foraminal narrowing at C3-4 is worse on the right. 4. Severe foraminal stenosis C4-5 is worse on  the left. 5. Severe left and moderate right foraminal stenosis at C5-6. 6. Severe right and moderate left foraminal stenosis at C6-7. 7. Facet hypertrophy at C7-T1 is worse on the right. There is no significant central canal stenosis. 8. Moderate foraminal stenosis at T2-3 and T3-4 is worse on the right.   PHYSICAL EXAM  Temp:  [97.9 F (36.6 C)-98.8 F (37.1 C)] 97.9 F (36.6 C) (01/02 1013) Pulse Rate:  [58-60] 59 (01/02 1013) Resp:  [18-20] 20 (01/02 1013) BP: (120-155)/(49-63) 149/63 (01/02 1013) SpO2:  [96 %-99 %] 98 % (01/02  1227)  General - Well nourished, well developed, in no apparent distress.  Ophthalmologic - Fundi not visualized due to noncooperation.  Cardiovascular - Regular rate and rhythm.  Mental Status -  Level of arousal and orientation to place, and person were intact, but not orientated to time. Language including expression, naming, repetition, comprehension was assessed and found intact. Fund of Knowledge was assessed and was impaired.  Cranial Nerves II - XII - II - Visual field intact OU. III, IV, VI - Extraocular movements intact. V - Facial sensation intact bilaterally. VII - Facial movement intact bilaterally. VIII - Hearing & vestibular intact bilaterally. X - Palate elevates symmetrically. XI - Chin turning & shoulder shrug intact bilaterally. XII - Tongue protrusion intact.  Motor Strength - The patient's strength was 4+/5 in all extremities except RUE proximal 3/5 and pronator drift was absent.  Bulk was normal and fasciculations were absent.   Motor Tone - Muscle tone was assessed at the neck and appendages and was normal.  Reflexes - The patient's reflexes were 1+ in all extremities and she had no pathological reflexes.  Sensory - Light touch, temperature/pinprick were assessed and were symmetrical.    Coordination - The patient had normal movements in the hands with no ataxia or dysmetria although right FTN slow and difficult but no ataxia.  Tremor was absent.  Gait and Station - deferred    ASSESSMENT/PLAN [name] is a 83 y.o. female with history of dementia, paroxysmal atrial fibrillation (Eliquis), arthritis, breast CA s/p surgery and chemo/radrx, depression, DM, HLD, HTN, renal disorder and cardiac pacemaker presenting with right upper extremity weakness, multiple falls, and feeling "swimmy headed". She did not receive IV t-PA due to late presentation and minimal deficits.  Cervical spinal cord compression on MRI concerning for cervical myelopathy vs.  Radiculopathy post fall. Pt lack of upper neuron sign, therefore the cervical spinal cord compression likely chronic but would consider acute on chronic post fall to explain her symptoms.   Resultant  Right arm weakness  MRI brain - no definite acute infarct but chronic right motor strip, pons, left PCA infarcts. In addition, concerning for cervical spinal cord compression  MRI C-spine - severe cervical spinal cord compression - pt lack of upper neuro signs, likely to be chronic but need to consider acute on chronic post fall to explain her symptoms. Recommend neurosurgery consultation.   MRA  Right VA occlusion, left PCA and right ICA siphon severe stenosis  Carotid Doppler - 1-39% ICA plaquing. Vertebral artery flow is antegrade.   2D Echo - EF 20-25% declined from her previous EF 40-45% in 02/2015  LDL - 133  HgbA1c 7.3  VTE prophylaxis - Eliquis Diet heart healthy/carb modified Room service appropriate? Yes; Fluid consistency: Thin  Eliquis (apixaban) daily prior to admission, now on Eliquis (apixaban) daily. Continue eliquis 2.'5mg'$  bid.   Patient counseled to be compliant with her antithrombotic medications  Ongoing aggressive stroke risk factor management  Therapy recommendations: Home health PT/OT  Disposition:  Pending  pAfib  Pacer interrogation showed no afib burden  On eliquis 2.'5mg'$  bid  Continue eliquis  Cardiomyopathy with CHF  2D Echo - EF 20-25% declined from her previous EF 40-45% in 02/2015  Continue anticoagulation with eliquis  Follow up with cardiology closely   Hypertension  Stable  Permissive hypertension (OK if < 220/120) but gradually normalize in 5-7 days  Long-term BP goal normotensive  Hyperlipidemia  Home meds: Lipitor 80 mg daily resumed in hospital  LDL 133, goal < 70  Continue statin at discharge  Diabetes  HgbA1c 7.3, goal < 7.0  uncontrolled  SSI  CBG monitoring  PCP close follow up  Other Stroke Risk  Factors  Advanced age  Other Active Problems  Hypokalemia - 3.2 ->3.3  Renal insufficiency - creatinine 1.21->1.12  Hospital day # 0  Neurology will sign off. Please call with questions. Pt needs to follow up with NSG and cardiology. No neuro follow up needed at this tim. Thanks for the consult.   Rosalin Hawking, MD PhD Stroke Neurology 02/22/2016 4:25 PM    To contact Stroke Continuity provider, please refer to [email or web address] After hours, contact General Neurology

## 2016-02-23 DIAGNOSIS — I5022 Chronic systolic (congestive) heart failure: Secondary | ICD-10-CM | POA: Diagnosis not present

## 2016-02-23 DIAGNOSIS — E785 Hyperlipidemia, unspecified: Secondary | ICD-10-CM | POA: Diagnosis not present

## 2016-02-23 DIAGNOSIS — N12 Tubulo-interstitial nephritis, not specified as acute or chronic: Secondary | ICD-10-CM | POA: Diagnosis not present

## 2016-02-23 DIAGNOSIS — N390 Urinary tract infection, site not specified: Secondary | ICD-10-CM | POA: Diagnosis not present

## 2016-02-23 DIAGNOSIS — N183 Chronic kidney disease, stage 3 (moderate): Secondary | ICD-10-CM | POA: Diagnosis not present

## 2016-02-23 DIAGNOSIS — R0600 Dyspnea, unspecified: Secondary | ICD-10-CM | POA: Diagnosis not present

## 2016-02-23 DIAGNOSIS — M4802 Spinal stenosis, cervical region: Secondary | ICD-10-CM | POA: Diagnosis not present

## 2016-02-23 DIAGNOSIS — R488 Other symbolic dysfunctions: Secondary | ICD-10-CM | POA: Diagnosis not present

## 2016-02-23 DIAGNOSIS — M25511 Pain in right shoulder: Secondary | ICD-10-CM | POA: Diagnosis not present

## 2016-02-23 DIAGNOSIS — Z950 Presence of cardiac pacemaker: Secondary | ICD-10-CM | POA: Diagnosis not present

## 2016-02-23 DIAGNOSIS — I110 Hypertensive heart disease with heart failure: Secondary | ICD-10-CM | POA: Diagnosis not present

## 2016-02-23 DIAGNOSIS — G9389 Other specified disorders of brain: Secondary | ICD-10-CM | POA: Diagnosis not present

## 2016-02-23 DIAGNOSIS — I10 Essential (primary) hypertension: Secondary | ICD-10-CM | POA: Diagnosis not present

## 2016-02-23 DIAGNOSIS — I672 Cerebral atherosclerosis: Secondary | ICD-10-CM | POA: Diagnosis not present

## 2016-02-23 DIAGNOSIS — F0390 Unspecified dementia without behavioral disturbance: Secondary | ICD-10-CM | POA: Diagnosis not present

## 2016-02-23 DIAGNOSIS — Z853 Personal history of malignant neoplasm of breast: Secondary | ICD-10-CM | POA: Diagnosis not present

## 2016-02-23 DIAGNOSIS — R262 Difficulty in walking, not elsewhere classified: Secondary | ICD-10-CM | POA: Diagnosis not present

## 2016-02-23 DIAGNOSIS — E1122 Type 2 diabetes mellitus with diabetic chronic kidney disease: Secondary | ICD-10-CM | POA: Diagnosis not present

## 2016-02-23 DIAGNOSIS — Z794 Long term (current) use of insulin: Secondary | ICD-10-CM | POA: Diagnosis not present

## 2016-02-23 DIAGNOSIS — I6330 Cerebral infarction due to thrombosis of unspecified cerebral artery: Secondary | ICD-10-CM | POA: Diagnosis not present

## 2016-02-23 DIAGNOSIS — G9529 Other cord compression: Secondary | ICD-10-CM | POA: Diagnosis not present

## 2016-02-23 DIAGNOSIS — Z923 Personal history of irradiation: Secondary | ICD-10-CM | POA: Diagnosis not present

## 2016-02-23 DIAGNOSIS — R531 Weakness: Secondary | ICD-10-CM | POA: Diagnosis not present

## 2016-02-23 DIAGNOSIS — M1990 Unspecified osteoarthritis, unspecified site: Secondary | ICD-10-CM | POA: Diagnosis not present

## 2016-02-23 DIAGNOSIS — B961 Klebsiella pneumoniae [K. pneumoniae] as the cause of diseases classified elsewhere: Secondary | ICD-10-CM | POA: Diagnosis not present

## 2016-02-23 DIAGNOSIS — Z9221 Personal history of antineoplastic chemotherapy: Secondary | ICD-10-CM | POA: Diagnosis not present

## 2016-02-23 DIAGNOSIS — E876 Hypokalemia: Secondary | ICD-10-CM | POA: Diagnosis not present

## 2016-02-23 DIAGNOSIS — M6281 Muscle weakness (generalized): Secondary | ICD-10-CM | POA: Diagnosis not present

## 2016-02-23 DIAGNOSIS — E1121 Type 2 diabetes mellitus with diabetic nephropathy: Secondary | ICD-10-CM | POA: Diagnosis not present

## 2016-02-23 DIAGNOSIS — E1165 Type 2 diabetes mellitus with hyperglycemia: Secondary | ICD-10-CM | POA: Diagnosis not present

## 2016-02-23 DIAGNOSIS — R296 Repeated falls: Secondary | ICD-10-CM | POA: Diagnosis not present

## 2016-02-23 DIAGNOSIS — I255 Ischemic cardiomyopathy: Secondary | ICD-10-CM | POA: Diagnosis not present

## 2016-02-23 DIAGNOSIS — I130 Hypertensive heart and chronic kidney disease with heart failure and stage 1 through stage 4 chronic kidney disease, or unspecified chronic kidney disease: Secondary | ICD-10-CM | POA: Diagnosis not present

## 2016-02-23 DIAGNOSIS — I480 Paroxysmal atrial fibrillation: Secondary | ICD-10-CM | POA: Diagnosis not present

## 2016-02-23 DIAGNOSIS — I6523 Occlusion and stenosis of bilateral carotid arteries: Secondary | ICD-10-CM | POA: Diagnosis not present

## 2016-02-23 DIAGNOSIS — I700 Atherosclerosis of aorta: Secondary | ICD-10-CM | POA: Diagnosis not present

## 2016-02-23 LAB — BASIC METABOLIC PANEL
Anion gap: 4 — ABNORMAL LOW (ref 5–15)
BUN: 19 mg/dL (ref 4–21)
BUN: 19 mg/dL (ref 6–20)
CHLORIDE: 101 mmol/L (ref 101–111)
CO2: 33 mmol/L — ABNORMAL HIGH (ref 22–32)
CREATININE: 1.1 mg/dL (ref <=1.1)
Calcium: 8.8 mg/dL — ABNORMAL LOW (ref 8.9–10.3)
Creatinine, Ser: 1.12 mg/dL — ABNORMAL HIGH (ref 0.44–1.00)
GFR calc Af Amer: 52 mL/min — ABNORMAL LOW (ref >60)
GFR calc non Af Amer: 44 mL/min — ABNORMAL LOW (ref >60)
GLUCOSE: 139 mg/dL — AB (ref 65–99)
Glucose: 139 mg/dL
Potassium: 4.1 mmol/L (ref 3.5–5.1)
Sodium: 138 mmol/L (ref 135–145)
Sodium: 138 mmol/L (ref 137–147)

## 2016-02-23 LAB — CBC
HEMATOCRIT: 36.7 % (ref 36.0–46.0)
Hemoglobin: 12.3 g/dL (ref 12.0–15.0)
MCH: 30.8 pg (ref 26.0–34.0)
MCHC: 33.5 g/dL (ref 30.0–36.0)
MCV: 92 fL (ref 78.0–100.0)
Platelets: 215 10*3/uL (ref 150–400)
RBC: 3.99 MIL/uL (ref 3.87–5.11)
RDW: 16.5 % — AB (ref 11.5–15.5)
WBC: 7.4 10*3/uL (ref 4.0–10.5)

## 2016-02-23 LAB — CBC AND DIFFERENTIAL
HEMATOCRIT: 37 % (ref 36–46)
Hemoglobin: 12.3 g/dL (ref 12.0–16.0)
PLATELETS: 215 10*3/uL (ref 150–399)
WBC: 7.4 10*3/mL

## 2016-02-23 LAB — GLUCOSE, CAPILLARY
GLUCOSE-CAPILLARY: 164 mg/dL — AB (ref 65–99)
Glucose-Capillary: 153 mg/dL — ABNORMAL HIGH (ref 65–99)
Glucose-Capillary: 210 mg/dL — ABNORMAL HIGH (ref 65–99)

## 2016-02-23 NOTE — Clinical Social Work Note (Signed)
CSW consulted for New SNF today. CSW spoke with pt and sister to address consult. Pt has been to SNF in the past and would like to return to [facility]. CSW referred pt to [facility], who is able to accept today as pt is ready for discharge today. Pt and sister are aware and agreeable to discharge plan. RN to call report and PTAR will provide transportation. CSW is singing off as no further needs identified.   Darden Dates, MSW, LCSW  Clinical Social Worker  [phone]

## 2016-02-23 NOTE — Care Management Note (Signed)
Case Management Note  Patient Details  Name: AZUREE MINISH MRN: [id number] Date of Birth: [birth year]  Subjective/Objective:                    Action/Plan: Pt discharging to [facility] today. No further needs per CM.   Expected Discharge Date:                  Expected Discharge Plan:  [place]  In-House Referral:     Discharge planning Services     Post Acute Care Choice:    Choice offered to:     DME Arranged:    DME Agency:     HH Arranged:    [place] Agency:     Status of Service:  Completed, signed off  If discussed at [employer] of Stay Meetings, dates discussed:    Additional Comments:  Pollie Friar, RN 02/23/2016, 3:09 PM

## 2016-02-23 NOTE — Progress Notes (Signed)
Physical Therapy Treatment & Vestibular Evaluation Patient Details Name: Allison Dunn MRN: [id number] DOB: 1931/12/10 Today's Date: 02/23/2016    History of Present Illness Patient is a 83 y/o female with hx of cognitive impairment, SSS, pacemaker, HTN, A-fib, DM, depression presents with weakness. Head CT suggestive of left occipital low density concerning for infarction. Cannot do MRI due to pacemaker.    PT Comments    Patient presents with dizziness likely from central source.  Educated this session in visual compensation and highly cautioned not to move without assistance at home.  She continues at high risk for falls and will benefit from HHPT at d/c for mobilization as habituation for central vestibular deficits.  PT to follow until d/c.  Follow Up Recommendations  Home health PT;Supervision for mobility/OOB;Supervision/Assistance - 24 hour     Equipment Recommendations  None recommended by PT    Recommendations for Other Services       Precautions / Restrictions Precautions Precautions: Fall    Mobility  Bed Mobility Overal bed mobility: Needs Assistance Bed Mobility: Sit to Sidelying;Sidelying to Sit   Sidelying to sit: Min assist     Sit to sidelying: Min assist General bed mobility comments: for positioning for vestibular testing  Transfers Overall transfer level: Needs assistance Equipment used: Rolling walker (2 wheeled) Transfers: Sit to/from Stand Sit to Stand: Min guard         General transfer comment: increased time, cues for safety  Ambulation/Gait Ambulation/Gait assistance: Min guard Ambulation Distance (Feet): 90 Feet Assistive device: Rolling walker (2 wheeled) Gait Pattern/deviations: Step-through pattern;Trunk flexed;Decreased stride length;Shuffle Gait velocity: decreased   General Gait Details: cues throughout for visual compensation to assist with "swimmy headed" feeling, cues for posture, steadying assist    Stairs             Wheelchair Mobility    Modified Rankin (Stroke Patients Only) Modified Rankin (Stroke Patients Only) Pre-Morbid Rankin Score: Moderate disability Modified Rankin: Moderately severe disability     Balance Overall balance assessment: Needs assistance Sitting-balance support: Feet unsupported;Feet supported Sitting balance-Leahy Scale: Good Sitting balance - Comments: EOB for all vestibular testing, R lateral lean, but no LOB                            Cognition Arousal/Alertness: Awake/alert Behavior During Therapy: WFL for tasks assessed/performed Overall Cognitive Status: History of cognitive impairments - at baseline                      Exercises      General Comments General comments (skin integrity, edema, etc.): Educated on importance of having assist when mobilizing at home for fall prevention; reports she won't be left alone.   02/23/16 0001  Symptom Behavior  Type of Dizziness "Funny feeling in head" (swimmy headed, blurry vision)  Frequency of Dizziness intermittent with movement  Duration of Dizziness few minutes  Aggravating Factors Turning head quickly;Supine to sit;Sit to stand  Relieving Factors Rest  Occulomotor Exam  Occulomotor Alignment Abnormal (R eye somewhat medially deviated)  Spontaneous Absent  Gaze-induced Absent  Head shaking Horizontal Absent  Smooth Pursuits Intact  Saccades Intact;Slow  Vestibulo-Occular Reflex  VOR 1 Head Only (x 1 viewing) horizontal and vertical x 30 seconds with mild symptoms with horizontal head movements only  VOR to Slow Head Movement Normal  VOR Cancellation Corrective saccades (with head movement to R)  Other Tests  Comments intact and equal  bilateral to scratch test  Positional Testing  Sidelying Test Sidelying Right;Sidelying Left  Horizontal Canal Testing Horizontal Canal Right;Horizontal Canal Left  Sidelying Right  Sidelying Right Duration 60 sec  Sidelying Right Symptoms No  nystagmus  Sidelying Left  Sidelying Left Duration 60 sec  Sidelying Left Symptoms No nystagmus  Horizontal Canal Right  Horizontal Canal Right Duration 45 sec  Horizontal Canal Right Symptoms Normal  Horizontal Canal Left  Horizontal Canal Left Duration 45 sec  Horizontal Canal Left Symptoms Normal       Pertinent Vitals/Pain Pain Assessment: No/denies pain    Home Living                      Prior Function            PT Goals (current goals can now be found in the care plan section) Progress towards PT goals: Progressing toward goals    Frequency    Min 4X/week      PT Plan Current plan remains appropriate    Co-evaluation             End of Session Equipment Utilized During Treatment: Gait belt Activity Tolerance: Patient tolerated treatment well Patient left: in bed;with bed alarm set;with call bell/phone within reach     Time: 0942-1015 PT Time Calculation (min) (ACUTE ONLY): 33 min  Charges:  $Gait Training: 8-22 mins $Neuromuscular Re-education: 8-22 mins                    G Codes:      Reginia Naas March 11, 2016, 11:34 AM  Magda Kiel, [place] Mar 11, 2016

## 2016-02-23 NOTE — NC FL2 (Signed)
[place] LEVEL OF CARE SCREENING TOOL     IDENTIFICATION  Patient Name: Allison Dunn Birthdate: 03/27/1931 Sex: female Admission Date (Current Location): 02/19/2016  [facility] and [place] Number:  Herbalist and Address:  [facility], [place] [address] [place], [place] [address]      Provider Number: [id number]  Attending Physician Name and Address:  Theodis Blaze, MD  Relative Name and Phone Number:       Current Level of Care: Hospital Recommended Level of Care: [place] Prior Approval Number:    Date Approved/Denied:   PASRR Number: [id number]  Discharge Plan: SNF    Current Diagnoses: Patient Active Problem List   Diagnosis Date Noted  . Myelopathy ([place])   . Weakness 02/19/2016  . Diaphragmatic injury as surgical complication 47/42/5956  . Postoperative anemia due to acute blood loss 08/14/2015  . Dementia without behavioral disturbance 08/14/2015  . Chronic UTI 07/29/2015  . 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 Klebsiella sepsis ([place]) 05/02/2015  . CAP (community acquired pneumonia) 05/02/2015  . Transaminasemia   . Sepsis ([place]) 04/22/2015  . Paroxysmal atrial fibrillation ([place]) 04/22/2015  . Delirium   . Urinary tract infectious disease   . Osteoarthritis 03/27/2015  . Cerebrovascular accident (CVA) due to thrombosis of cerebral artery ([place]) 03/23/2015  . Moderate tricuspid regurgitation 03/11/2015  . Cardiomyopathy ([place]), EF 40-45%  02/2015 03/11/2015  . Mild mitral regurgitation 03/11/2015  . Pulmonary arterial hypertension ([place]), mild 03/11/2015  . Mild cognitive impairment 02/24/2015  . Type 2 diabetes mellitus without complication, with long-term current use of insulin ([place]) 02/24/2015  . Hypertensive heart disease with CHF (congestive heart failure) ([place]) 02/24/2015  . Essential hypertension, benign 02/21/2015  . Hyperlipidemia 02/19/2015  . Gout 02/19/2015  . PAD  (peripheral artery disease) ([place]) 02/19/2015  . Cardiac pacemaker in situ 02/19/2015  . CKD (chronic kidney disease) 02/19/2015    Orientation RESPIRATION BLADDER Height & Weight     Self, Time, Situation, Place  Normal Continent Weight: 106 lb 6.4 oz (48.3 kg) Height:  '5\' 1"'$  (154.9 cm)  BEHAVIORAL SYMPTOMS/MOOD NEUROLOGICAL BOWEL NUTRITION STATUS      Continent Diet (Heart Heart/Carb Modified, Thin Liquids)  AMBULATORY STATUS COMMUNICATION OF NEEDS Skin   Extensive Assist Verbally Normal                       Personal Care Assistance Level of Assistance  Bathing, Feeding, Dressing Bathing Assistance: Limited assistance Feeding assistance: Independent Dressing Assistance: Limited assistance     Functional Limitations Info  Sight, Hearing, Speech Sight Info: Adequate Hearing Info: Adequate Speech Info: Adequate    SPECIAL CARE FACTORS FREQUENCY  PT (By licensed PT), OT (By licensed OT)     PT Frequency: 5 OT Frequency: 5            Contractures Contractures Info: Not present    Additional Factors Info  Code Status, Allergies, Insulin Sliding Scale Code Status Info: Full Code Allergies Info: Penicillins   Insulin Sliding Scale Info: SSN:  [id number]       Current Medications (02/23/2016):  This is the current hospital active medication list Current Facility-Administered Medications  Medication Dose Route Frequency Provider Last Rate Last Dose  .  stroke: mapping our early stages of recovery book   Does not apply Once [employer], DO      . acetaminophen (TYLENOL) tablet 650 mg  650 mg Oral Q4H PRN Cristal Ford,  DO       Or  . acetaminophen (TYLENOL) solution 650 mg  650 mg Per Tube Q4H PRN Maryann Mikhail, DO       Or  . acetaminophen (TYLENOL) suppository 650 mg  650 mg Rectal Q4H PRN Maryann Mikhail, DO      . allopurinol (ZYLOPRIM) tablet 300 mg  300 mg Oral Daily Maryann Mikhail, DO   300 mg at 02/23/16 1120  . amiodarone (PACERONE) tablet 200  mg  200 mg Oral Daily Maryann Mikhail, DO   200 mg at 02/23/16 1120  . apixaban (ELIQUIS) tablet 2.5 mg  2.5 mg Oral BID Maryann Mikhail, DO   2.5 mg at 02/23/16 1121  . atorvastatin (LIPITOR) tablet 40 mg  40 mg Oral Q supper Rosalin Hawking, MD   40 mg at 02/22/16 1819  . donepezil (ARICEPT) tablet 10 mg  10 mg Oral QHS Maryann Mikhail, DO   10 mg at 02/22/16 2236  . feeding supplement (GLUCERNA SHAKE) (GLUCERNA SHAKE) liquid 237 mL  237 mL Oral BID BM Maryann Mikhail, DO   1 Container at 02/23/16 1132  . insulin aspart (novoLOG) injection 0-9 Units  0-9 Units Subcutaneous TID PC & HS Maryann Mikhail, DO   3 Units at 02/23/16 1311  . potassium chloride SA (K-DUR,KLOR-CON) CR tablet 40 mEq  40 mEq Oral Once [employer], DO      . senna-docusate (Senokot-S) tablet 1 tablet  1 tablet Oral QHS PRN Cristal Ford, DO         Discharge Medications: Please see discharge summary for a list of discharge medications.  Relevant Imaging Results:  Relevant Lab Results:   Additional Information SSN:  [id number]  Darden Dates, LCSW

## 2016-02-23 NOTE — Discharge Summary (Signed)
Physician Discharge Summary  Allison Dunn WRU:[id number] DOB: May 06, 1931 DOA: 02/19/2016  PCP: Binnie Rail, MD  Admit date: 02/19/2016 Discharge date: 02/23/2016  Recommendations for Outpatient Follow-up:  1. Pt will need to follow up with PCP in 1-2 weeks post discharge 2. Please obtain BMP to evaluate electrolytes and kidney function 3. Please also check CBC to evaluate Hg and Hct levels 4. Dr. Cyndy Freeze from neurosurgery, no acute interventions needed, recommended PT/OT and follow up with him in 2 weeks in office  Discharge Diagnoses:  Active Problems:   Hyperlipidemia   CKD (chronic kidney disease)   Essential hypertension, benign  Discharge Condition: Stable  Diet recommendation: Heart healthy diet discussed in details   History of present illness:  83 y.o.femalewith a medical history of diabetes, hypertension, breast cancer, presented to the emergency department with complaints of weakness  Assessment & Plan: Generalized weakness, rule out CVA - CT head showed interval development of left occipital low density concerning for infarction of indeterminate age. - Neurology consulted and appreciated - Dr. Ree Kida d/w [employer] representative on admission, patient has an MRI compatible PPM. For some reason, there is a note from radiology that the PPM is not MRI compatible based on chest x-ray appearance. Dr Renne Crigler reviewed the PPM again 1/2 on [email or web address], as well as discussed with on-call electrophysiology, the PPM appears compatible, and chest x-ray is not ideal to determine compatibility of a pacemaker - PT/OT eval done, SNF recommended, placement completed, SNF at [facility]   - MRI finally obtained 1/1 and showed no acute CVA, but did show widespread severe degenerative cervical spinal stenosis with severe cervical spinal cord mass effect.   Degenerative C spine changes  - as evidenced by MRI, d/w Dr. Cyndy Freeze from neurosurgery, no acute interventions needed,  recommended PT/OT and follow up with him in 2 weeks in office  Mildly elevated troponin - Troponin 0.05, flat, not in a pattern consistent with ACS  Chronic systolic CHF - repeat 2D echo with EF 20-25% from 40% at the beginning of the year - appears euvolemic, is on ACEI at home, continue - need follow up with cardiology in few weeks as an outpatient. No chest pain / telemetry unremarkable.   Hypokalemia - supplemented and WNL this AM   Diabetes mellitus, type II with complications of nephropathy  - resume home medical regimen   Essential hypertension - stable  Paroxysmal atrial fibrillation - Continue Eliquis, amiodarone - Currently atrially paced  Dementia  - Continue Aricept   Chronic kidney disease, stage III - Creatinine appears to be stable  DVT prophylaxis: Eliquis Code Status: Full Family Communication: no family bedside Disposition Plan: SNF today   Consultants:   Neurology   Procedures:   2D echo  Antimicrobials:  None    Procedures/Studies: Dg Chest 2 View  Result Date: 02/19/2016 CLINICAL DATA:  Cerebral vascular accident. EXAM: CHEST  2 VIEW COMPARISON:  Radiograph July 29, 2015. FINDINGS: The heart size and mediastinal contours are within normal limits. Right-sided pacemaker is unchanged in position. Atherosclerosis of thoracic aorta is noted. No pneumothorax or pleural effusion is noted. Both lungs are clear. The visualized skeletal structures are unremarkable. IMPRESSION: No active cardiopulmonary disease.  Aortic atherosclerosis. Electronically Signed   By: Marijo Conception, M.D.   On: 02/19/2016 20:58   Ct Head Wo Contrast  Result Date: 02/19/2016 CLINICAL DATA:  Weakness. EXAM: CT HEAD WITHOUT CONTRAST TECHNIQUE: Contiguous axial images were obtained from the base of the skull through the vertex  without intravenous contrast. COMPARISON:  CT scan of April 14, 2015. FINDINGS: Brain: Mild chronic ischemic white matter disease is  noted. No mass effect or midline shift is noted. Ventricular size is within normal limits. There is no evidence of mass lesion or hemorrhage. There is interval development of low density involving the left occipital lobe concerning for infarction of indeterminate age. Vascular: No hyperdense vessel or unexpected calcification. Skull: Normal. Negative for fracture or focal lesion. Sinuses/Orbits: No acute finding. Other: None. IMPRESSION: Interval development of left occipital low density concerning for infarction of indeterminate age. MRI is recommended for further evaluation. Electronically Signed   By: Marijo Conception, M.D.   On: 02/19/2016 15:13   Mr Virgel Paling KZ Contrast  Result Date: 02/21/2016 CLINICAL DATA:  83 year old female with generalized weakness and recent falls. Right arm weakness. Query stroke. Initial encounter. EXAM: MRI HEAD WITHOUT CONTRAST MRA HEAD WITHOUT CONTRAST TECHNIQUE: Multiplanar, multiecho pulse sequences of the brain and surrounding structures were obtained without intravenous contrast. Angiographic images of the head were obtained using MRA technique without contrast. COMPARISON:  Head CTs without contrast 02/19/2016 and earlier. FINDINGS: MRI HEAD FINDINGS Brain: Chronic lacunar infarct along the ventral left thalamus. Small to moderate area of cortical encephalomalacia in the left occipital pole with some laminar necrosis. Small chronic lacunar infarct in the left central brainstem at the junction of the midbrain and pons (series 6, image 9). Normal signal throughout the remaining lower brainstem. There is a tiny chronic cortical infarct at the right superior frontal gyrus motor strip (series 8, image 20). Minimal for age nonspecific cerebral white matter signal changes. There are fairly symmetric signal changes in the cerebellar peduncle is with T2 and FLAIR hyperintensity (series 6 image 7). There is associated increased trace diffusion signal, but this does not appear  restricted (series 4, image 11 and series 400, image 11). The cerebellum otherwise appears normal. No convincing restricted diffusion to suggest acute infarction. No midline shift, mass effect, evidence of mass lesion, ventriculomegaly, extra-axial collection or acute intracranial hemorrhage. Cervicomedullary junction and pituitary are within normal limits. Vascular: Major intracranial vascular flow voids are remarkable for loss of the distal right vertebral artery flow void. The distal left vertebral artery appears normal, and the basilar artery flow void is maintained. Other Major intracranial vascular flow voids are preserved. Skull and upper cervical spine: Multilevel severe degenerative appearing cervical spinal stenosis and severe spinal cord mass effect (series 3, image 12). Widespread severe cervical disc space loss. Normal bone marrow signal. Sinuses/Orbits: Normal orbits aside from postoperative changes to both globes. Visualized paranasal sinuses and mastoids are stable and well pneumatized. Other: Mild mastoid effusions greater on the left. Negative nasopharynx. Grossly normal visualized other internal auditory structures. Negative scalp soft tissues. MRA HEAD FINDINGS Antegrade flow in the distal left vertebral artery which is irregular but without significant stenosis. Minimal antegrade flow signal in the distal right vertebral artery. There is probably retrograde supply to the right V4 segment and right PICA from the vertebrobasilar junction. The left PICA origin is patent. The basilar artery is irregular but without hemodynamically significant stenosis. Both SCA and PCA origins are patent although there is moderate to severe left and mild-to-moderate right widespread P1 and P2 segment irregularity with stenosis. Distal left PCA branches are attenuated. Posterior communicating arteries are diminutive or absent. Antegrade flow in both ICA siphons with severe irregularity throughout the right cavernous  and supraclinoid segments. Associated moderate or severe stenosis perhaps most pronounced at the distal  cavernous segment. The right ICA terminus remains patent. The right MCA origin is normal. Left siphon antegrade flow with up to moderate irregularity in the cavernous and supraclinoid segments but only mild stenosis. Normal left ICA terminus, left MCA and ACA origins. The right A1 is diminutive or absent. The anterior communicating artery and visualized ACA branches are within normal limits. MCA M1 segments and bifurcations are patent without stenosis. No MCA branch occlusion is identified. Mild bilateral MCA branch irregularity. IMPRESSION: 1. Widespread severe degenerative cervical spinal stenosis with severe cervical spinal cord mass effect. See series 3, image 12. 2. Chronic appearing occlusion or near-occlusion of the distal Right Vertebral artery and other severe intracranial atherosclerosis, including severe stenosis of the Right ICA siphon and Left PCA. 3. No definite acute infarct; unusual, symmetric signal changes in the bilateral cerebellar peduncles without strong evidence of acute infarct. Chronic infarcts in the left PCA territory with laminar necrosis. Chronic right brainstem lacune at the pons midbrain junction, and tiny chronic right superior motor strip infarct. Electronically Signed   By: Genevie Ann M.D.   On: 02/21/2016 12:41   Mr Brain Wo Contrast  Result Date: 02/21/2016 CLINICAL DATA:  83 year old female with generalized weakness and recent falls. Right arm weakness. Query stroke. Initial encounter. EXAM: MRI HEAD WITHOUT CONTRAST MRA HEAD WITHOUT CONTRAST TECHNIQUE: Multiplanar, multiecho pulse sequences of the brain and surrounding structures were obtained without intravenous contrast. Angiographic images of the head were obtained using MRA technique without contrast. COMPARISON:  Head CTs without contrast 02/19/2016 and earlier. FINDINGS: MRI HEAD FINDINGS Brain: Chronic lacunar infarct  along the ventral left thalamus. Small to moderate area of cortical encephalomalacia in the left occipital pole with some laminar necrosis. Small chronic lacunar infarct in the left central brainstem at the junction of the midbrain and pons (series 6, image 9). Normal signal throughout the remaining lower brainstem. There is a tiny chronic cortical infarct at the right superior frontal gyrus motor strip (series 8, image 20). Minimal for age nonspecific cerebral white matter signal changes. There are fairly symmetric signal changes in the cerebellar peduncle is with T2 and FLAIR hyperintensity (series 6 image 7). There is associated increased trace diffusion signal, but this does not appear restricted (series 4, image 11 and series 400, image 11). The cerebellum otherwise appears normal. No convincing restricted diffusion to suggest acute infarction. No midline shift, mass effect, evidence of mass lesion, ventriculomegaly, extra-axial collection or acute intracranial hemorrhage. Cervicomedullary junction and pituitary are within normal limits. Vascular: Major intracranial vascular flow voids are remarkable for loss of the distal right vertebral artery flow void. The distal left vertebral artery appears normal, and the basilar artery flow void is maintained. Other Major intracranial vascular flow voids are preserved. Skull and upper cervical spine: Multilevel severe degenerative appearing cervical spinal stenosis and severe spinal cord mass effect (series 3, image 12). Widespread severe cervical disc space loss. Normal bone marrow signal. Sinuses/Orbits: Normal orbits aside from postoperative changes to both globes. Visualized paranasal sinuses and mastoids are stable and well pneumatized. Other: Mild mastoid effusions greater on the left. Negative nasopharynx. Grossly normal visualized other internal auditory structures. Negative scalp soft tissues. MRA HEAD FINDINGS Antegrade flow in the distal left vertebral artery  which is irregular but without significant stenosis. Minimal antegrade flow signal in the distal right vertebral artery. There is probably retrograde supply to the right V4 segment and right PICA from the vertebrobasilar junction. The left PICA origin is patent. The basilar artery is irregular  but without hemodynamically significant stenosis. Both SCA and PCA origins are patent although there is moderate to severe left and mild-to-moderate right widespread P1 and P2 segment irregularity with stenosis. Distal left PCA branches are attenuated. Posterior communicating arteries are diminutive or absent. Antegrade flow in both ICA siphons with severe irregularity throughout the right cavernous and supraclinoid segments. Associated moderate or severe stenosis perhaps most pronounced at the distal cavernous segment. The right ICA terminus remains patent. The right MCA origin is normal. Left siphon antegrade flow with up to moderate irregularity in the cavernous and supraclinoid segments but only mild stenosis. Normal left ICA terminus, left MCA and ACA origins. The right A1 is diminutive or absent. The anterior communicating artery and visualized ACA branches are within normal limits. MCA M1 segments and bifurcations are patent without stenosis. No MCA branch occlusion is identified. Mild bilateral MCA branch irregularity. IMPRESSION: 1. Widespread severe degenerative cervical spinal stenosis with severe cervical spinal cord mass effect. See series 3, image 12. 2. Chronic appearing occlusion or near-occlusion of the distal Right Vertebral artery and other severe intracranial atherosclerosis, including severe stenosis of the Right ICA siphon and Left PCA. 3. No definite acute infarct; unusual, symmetric signal changes in the bilateral cerebellar peduncles without strong evidence of acute infarct. Chronic infarcts in the left PCA territory with laminar necrosis. Chronic right brainstem lacune at the pons midbrain junction,  and tiny chronic right superior motor strip infarct. Electronically Signed   By: Genevie Ann M.D.   On: 02/21/2016 12:41   Mr Cervical Spine Wo Contrast  Result Date: 02/22/2016 CLINICAL DATA:  Fall. Progressive right upper extremity weakness after fall. Right shoulder pain. EXAM: MRI CERVICAL SPINE WITHOUT CONTRAST TECHNIQUE: Multiplanar, multisequence MR imaging of the cervical spine was performed. No intravenous contrast was administered. COMPARISON:  MRI of the brain 02/21/2016 FINDINGS: Alignment: Slight retrolisthesis is present at C2-3 and C3-4. AP alignment is otherwise anatomic. Vertebrae: Chronic endplate marrow changes are present at C2-3, C3-4, and C4-5. Edematous changes are noted in the facet joints on the left at C3-4 and C4-5. Cord: Increased T2 cord signal is present from C3 through the C5. No mass lesion is present. Posterior Fossa, vertebral arteries, paraspinal tissues: The craniocervical junction is within normal limits. The visualized intracranial contents are normal. Flow is present in the vertebral arteries. The visualized paraspinous soft tissues are within normal limits. Disc levels: C2-3: A broad-based disc osteophyte complex effaces the ventral CSF. The central canal is narrowed to 5 mm. Severe left and moderate right foraminal stenosis is present. C3-4: Severe central canal stenosis is present. The canal is narrowed to less than 4 mm. Severe foraminal stenosis is worse on the right. C4-5: Severe central canal stenosis is present. The canal is narrowed to less than 3 mm. Severe foraminal stenosis is worse on the left. C5-6: A broad-based disc osteophyte complex effaces the ventral CSF. Severe left and moderate right foraminal stenosis is present. C6-7: The central canal is patent. Severe right and moderate left foraminal stenosis is present. C7-T1: A broad-based disc osteophyte complex is present. No significant stenosis is evident. Facet hypertrophy is worse on the right. Mild endplate  changes are present at T2-3 and T3-4. Uncovertebral and facet spurring results in moderate foraminal narrowing at these 2 levels, right greater than left. IMPRESSION: 1. Severe central canal stenosis at C2-3, C3-4, and particularly C4-5. 2. Severe left and moderate right foraminal stenosis at C2-3. 3. Severe foraminal narrowing at C3-4 is worse on the right.  4. Severe foraminal stenosis C4-5 is worse on the left. 5. Severe left and moderate right foraminal stenosis at C5-6. 6. Severe right and moderate left foraminal stenosis at C6-7. 7. Facet hypertrophy at C7-T1 is worse on the right. There is no significant central canal stenosis. 8. Moderate foraminal stenosis at T2-3 and T3-4 is worse on the right. Electronically Signed   By: San Morelle M.D.   On: 02/22/2016 15:09     Discharge Exam: Vitals:   02/23/16 0933 02/23/16 1410  BP: (!) 144/58 (!) 142/56  Pulse: 61 (!) 57  Resp: 18 16  Temp: 98.4 F (36.9 C) 99 F (37.2 C)   Vitals:   02/23/16 0521 02/23/16 0646 02/23/16 0933 02/23/16 1410  BP:   (!) 144/58 (!) 142/56  Pulse:   61 (!) 57  Resp:   18 16  Temp:   98.4 F (36.9 C) 99 F (37.2 C)  TempSrc:   Oral Oral  SpO2:   98% 97%  Weight:  48.3 kg (106 lb 6.4 oz)    Height: '5\' 1"'$  (1.549 m)      General: Pt is alert, follows commands appropriately, not in acute distress Cardiovascular: Irregular rate and rhythm, no rubs, no gallops Respiratory: Clear to auscultation bilaterally, no wheezing, no crackles, no rhonchi Abdominal: Soft, non tender, non distended, bowel sounds +, no guarding  Discharge Instructions  Discharge Instructions    Diet - low sodium heart healthy    Complete by:  As directed    Increase activity slowly    Complete by:  As directed      Allergies as of 02/23/2016   No Known Allergies     Medication List    TAKE these medications   allopurinol 300 MG tablet Commonly known as:  ZYLOPRIM TAKE ONE TABLET BY MOUTH ONCE DAILY   amiodarone 200 MG  tablet Commonly known as:  PACERONE Take 1 tablet (200 mg total) by mouth daily.   amLODipine 5 MG tablet Commonly known as:  NORVASC Take 1 tablet (5 mg total) by mouth daily.   apixaban 2.5 MG Tabs tablet Commonly known as:  ELIQUIS Take 1 tablet (2.5 mg total) by mouth 2 (two) times daily.   atorvastatin 80 MG tablet Commonly known as:  LIPITOR Take 1 tablet (80 mg total) by mouth daily with supper.   benazepril 10 MG tablet Commonly known as:  LOTENSIN Take 1 tablet (10 mg total) by mouth daily.   Cyanocobalamin 1500 MCG Tbdp Take 1,500 mcg by mouth daily with lunch.   D3-1000 PO Take 1 tablet by mouth daily with lunch.   donepezil 10 MG tablet Commonly known as:  ARICEPT Take 1 tablet (10 mg total) by mouth at bedtime.   feeding supplement (GLUCERNA SHAKE) Liqd Take 237 mLs by mouth 2 (two) times daily between meals.   glucose blood test strip Commonly known as:  ONE TOUCH TEST STRIPS Use as instructed to check blood sugar. DX E11.9   insulin lispro 100 UNIT/ML injection Commonly known as:  HUMALOG Inject 0-0.1 mls (0-10 units total) into the skin 3 times daily before meals. Sliding Scale. Give 5 units if greater than 200.   INSULIN SYRINGE .5CC/31GX5/16" 31G X 5/16" 0.5 ML Misc   onetouch ultrasoft lancets Use as instructed to check blood sugar daily. DX E11.9   WOMENS MULTI VITAMIN & MINERAL PO Take 1 tablet by mouth daily with lunch.      Follow-up Information    Kevan Ny Ditty,  MD. Schedule an appointment as soon as possible for a visit in 2 week(s).   Specialty:  Neurosurgery Contact information: [address] [place] [address] [phone]        Cristopher Peru, MD. Schedule an appointment as soon as possible for a visit in 2 week(s).   Specialty:  Cardiology Contact information: [address] [place] [address] [phone]        Binnie Rail, MD Follow up.   Specialty:  Internal  Medicine Contact information: [place] [address] [phone]            The results of significant diagnostics from this hospitalization (including imaging, microbiology, ancillary and laboratory) are listed below for reference.     Microbiology: No results found for this or any previous visit (from the past 240 hour(s)).   Labs: Basic Metabolic Panel:  Recent Labs Lab 02/19/16 1318 02/19/16 1633 02/21/16 0449 02/23/16 0605  NA 136  --  137 138  K 3.2*  --  3.3* 4.1  CL 97*  --  98* 101  CO2 30  --  32 33*  GLUCOSE 183*  --  132* 139*  BUN 26*  --  21* 19  CREATININE 1.21*  --  1.12* 1.12*  CALCIUM 9.1  --  9.0 8.8*  MG  --  1.8  --   --    CBC:  Recent Labs Lab 02/19/16 1318 02/23/16 0605  WBC 8.6 7.4  HGB 14.2 12.3  HCT 41.7 36.7  MCV 90.7 92.0  PLT 218 215   Cardiac Enzymes:  Recent Labs Lab 02/19/16 1320 02/19/16 1827 02/20/16 0010 02/20/16 0440  TROPONINI 0.05* 0.05* 0.05* 0.05*    CBG:  Recent Labs Lab 02/22/16 1129 02/22/16 1632 02/22/16 2135 02/23/16 0606 02/23/16 1119  GLUCAP 173* 198* 247* 153* 210*   SIGNED: Time coordinating discharge:  30 minutes  MAGICK-Maysoon Lozada, MD  [facility] 02/23/2016, 2:31 PM Pager [phone]  If 7PM-7AM, please contact night-coverage [email or web address] Password [email or web address]

## 2016-02-23 NOTE — Progress Notes (Signed)
Pt being discharge from hospital to [place] per orders from MD. Pt and family are aware of transfer. Pt's IV was removed prior to discharge. RN called [facility] and gave report to [facility]. Pt transferred via [facility].

## 2016-02-23 NOTE — Progress Notes (Signed)
SLP addendum    02/22/16 1422  SLP G-Codes **NOT FOR INPATIENT CLASS**  Functional Assessment Tool Used skilled clincial judgement  Functional Limitations Memory  Memory Current Status (Y0511) CK  Memory Goal Status (M2111) CK  Memory Discharge Status (G9170) CK  SLP Evaluations  $ SLP Speech Visit 1 Procedure  SLP Evaluations  $ SLP EVAL LANGUAGE/SOUND PRODUCTION 1 Procedure    Allison Dunn M.Ed [employer] [phone]

## 2016-02-23 NOTE — Care Management Note (Signed)
Case Management Note  Patient Details  Name: MALONE ADMIRE MRN: [id number] Date of Birth: [birth year]  Subjective/Objective:                    Action/Plan: CM spoke to patients sister Ms Caryn Section over the phone about discharge plans. Per Ms Caryn Section she can not handle taking care of her sister currently. She states she is having some health issues and would like her sister to go to SNF. She states Ms Vargus has been to [facility] in the past and would like to try and get her there again. CSW informed of request for SNF. CM following.   Expected Discharge Date:                  Expected Discharge Plan:  [place]  In-House Referral:     Discharge planning Services     Post Acute Care Choice:    Choice offered to:     DME Arranged:    DME Agency:     HH Arranged:    [place] Agency:     Status of Service:  In process, will continue to follow  If discussed at Long Length of Stay Meetings, dates discussed:    Additional Comments:  Pollie Friar, RN 02/23/2016, 8:37 AM

## 2016-02-23 NOTE — Discharge Instructions (Signed)
Deconditioning Deconditioning refers to the changes in your body that occur during a period of inactivity. The changes happen in your heart, lungs, and muscles. They decrease your ability to be active, and they make you feel tired and weak. There are three stages of deconditioning:  Mild deconditioning. At this stage, you will notice a change in your ability to do your usual exercise activities, such as running, biking, or swimming.  Moderate deconditioning. At this stage, you will notice a change in your ability to do normal everyday activities, such as walking, grocery shopping, and doing chores.  Severe deconditioning. At this stage, you will notice a change in your ability to do minimal activity or normal self-care. Deconditioning can occur after only a few days of inactivity. The longer the period of inactivity, the more severe the deconditioning will be, and the longer it will take to return to your previous level of functioning. What are the causes? Deconditioning is often caused by inactivity due to:  Illnesses, such as cancer, stroke, heart attack, fibromyalgia, and chronic fatigue syndrome.  Injuries, especially back injuries, broken bones, and ligament and tendon injuries.  A long stay in the hospital.  Pregnancy, especially if long periods of bed rest are needed. What increases the risk? This condition is more likely to develop in:  People who are hospitalized.  People on bed rest.  People who are obese.  People with poor nutrition.  Elderly adults.  People with injuries or illnesses that interfere with movement and activity. What are the signs or symptoms? Symptoms of deconditioning include:  Weakness.  Tiredness.  Shortness of breath with minor exertion.  A faster-than-normal heartbeat. You may not notice this without taking your pulse.  Pain or discomfort with activity.  Decreased strength.  Decreased sense of balance.  Decreased  endurance.  Difficulty doing your usual forms of exercise.  Difficulty doing activities of daily living, such as grocery shopping or chores.  Difficulty walking around the house and doing basic self-care, such as getting to the bathroom, preparing meals, or doing laundry. How is this diagnosed? Deconditioning is diagnosed based on your medical history and a physical exam. During the physical exam, your health care provider will check for signs of deconditioning, such as:  Decreased size of muscles.  Decreased strength.  Trouble with balance.  Shortness of breath or abnormally increased heart rate after minor exertion. How is this treated? Treatment for deconditioning usually involves following a structured exercise program in which activity is increased gradually. Your health care provider will determine which exercises are right for you. The exercise program will likely include aerobic exercise and strength training:  Aerobic exercise helps improve the functioning of the heart and lungs as well as the muscles.  Strength training helps improve muscle size and strength. Both of these types of exercise will improve your endurance. You may be referred to a physical therapist who can create a safe strengthening program for you to follow. Follow these instructions at home:  Follow the exercise program that is recommended by your health care provider or physical therapist.  Do not increase your exercise any faster than directed.  Eat a healthy diet.  Do not use any products that contain nicotine or tobacco, such as cigarettes and e-cigarettes. If you need help quitting, ask your health care provider.  Take over-the-counter and prescription medicines only as told by your health care provider.  Keep all follow-up visits as told by your health care provider. This is important. Contact a  health care provider if:  You are not able to carry out the prescribed exercise program.  You are  becoming more and more fatigued and weak.  You become light-headed when rising to a sitting or standing position.  Your level of endurance decreases after it has improved. Get help right away if:  You have chest pain.  You are very short of breath.  You have any episodes of passing out. This information is not intended to replace advice given to you by your health care provider. Make sure you discuss any questions you have with your health care provider. Document Released: 06/23/2013 Document Revised: 08/27/2015 Document Reviewed: 05/08/2015 Elsevier Interactive Patient Education  2017 [employer].

## 2016-02-23 NOTE — Clinical Social Work Placement (Signed)
   CLINICAL SOCIAL WORK PLACEMENT  NOTE  Date:  02/23/2016  Patient Details  Name: Allison Dunn MRN: [id number] Date of Birth: 10-27-1931  Clinical Social Work is seeking post-discharge placement for this patient at the [place] level of care (*CSW will initial, date and re-position this form in  chart as items are completed):  Yes   Patient/family provided with [facility] Work Department's list of facilities offering this level of care within the geographic area requested by the patient (or if unable, by the patient's family).  Yes   Patient/family informed of their freedom to choose among providers that offer the needed level of care, that participate in Medicare, Medicaid or managed care program needed by the patient, have an available bed and are willing to accept the patient.  Yes   Patient/family informed of [facility]'s ownership interest in [facility] and [facility], as well as of the fact that they are under no obligation to receive care at these facilities.  PASRR submitted to EDS on       PASRR number received on       Existing PASRR number confirmed on 02/23/16     FL2 transmitted to all facilities in geographic area requested by pt/family on 02/23/16     FL2 transmitted to all facilities within larger geographic area on       Patient informed that his/her managed care company has contracts with or will negotiate with certain facilities, including the following:        Yes   Patient/family informed of bed offers received.  Patient chooses bed at [facility]     Physician recommends and patient chooses bed at      Patient to be transferred to [facility] on 02/23/16.  Patient to be transferred to facility by [facility]     Patient family notified on 02/23/16 of transfer.  Name of family member notified:  Pt and sister     PHYSICIAN       Additional Comment:     _______________________________________________ Darden Dates, LCSW 02/23/2016, 2:59 PM

## 2016-02-24 ENCOUNTER — Telehealth: Payer: Self-pay | Admitting: *Deleted

## 2016-02-24 NOTE — Telephone Encounter (Signed)
Tried calling pt to set-up TCM appt no answer LMOM RTC...Allison Dunn

## 2016-02-25 NOTE — Telephone Encounter (Signed)
Tried calling pt again still no answer LMOM on sister cell to return call back...Allison Dunn

## 2016-02-28 ENCOUNTER — Encounter: Payer: Self-pay | Admitting: Internal Medicine

## 2016-02-28 ENCOUNTER — Non-Acute Institutional Stay (SKILLED_NURSING_FACILITY): Payer: Medicare Other | Admitting: Internal Medicine

## 2016-02-28 DIAGNOSIS — M4710 Other spondylosis with myelopathy, site unspecified: Secondary | ICD-10-CM

## 2016-02-28 DIAGNOSIS — N183 Chronic kidney disease, stage 3 unspecified: Secondary | ICD-10-CM

## 2016-02-28 DIAGNOSIS — M4802 Spinal stenosis, cervical region: Secondary | ICD-10-CM | POA: Diagnosis not present

## 2016-02-28 DIAGNOSIS — E876 Hypokalemia: Secondary | ICD-10-CM

## 2016-02-28 DIAGNOSIS — E1122 Type 2 diabetes mellitus with diabetic chronic kidney disease: Secondary | ICD-10-CM

## 2016-02-28 DIAGNOSIS — G9529 Other cord compression: Secondary | ICD-10-CM | POA: Diagnosis not present

## 2016-02-28 DIAGNOSIS — R531 Weakness: Secondary | ICD-10-CM | POA: Diagnosis not present

## 2016-02-28 DIAGNOSIS — I110 Hypertensive heart disease with heart failure: Secondary | ICD-10-CM | POA: Diagnosis not present

## 2016-02-28 DIAGNOSIS — F0280 Dementia in other diseases classified elsewhere without behavioral disturbance: Secondary | ICD-10-CM

## 2016-02-28 DIAGNOSIS — I255 Ischemic cardiomyopathy: Secondary | ICD-10-CM

## 2016-02-28 DIAGNOSIS — Z794 Long term (current) use of insulin: Secondary | ICD-10-CM

## 2016-02-28 DIAGNOSIS — G301 Alzheimer's disease with late onset: Secondary | ICD-10-CM | POA: Diagnosis not present

## 2016-02-28 DIAGNOSIS — G3189 Other specified degenerative diseases of nervous system: Secondary | ICD-10-CM | POA: Diagnosis not present

## 2016-02-28 NOTE — Progress Notes (Signed)
: Provider:  Inocencio Homes MD Location:  [facility] [place] Room Number: 993 Place of Service:  SNF ([phone])  PCP: Binnie Rail, MD Patient Care Team: Binnie Rail, MD as PCP - General (Internal Medicine) Jola Schmidt, MD as Consulting Physician (Ophthalmology) Cameron Sprang, MD as Consulting Physician (Neurology) Evans Lance, MD as Consulting Physician (Cardiology)  Extended Emergency Contact Information Primary Emergency Contact: [name],[name] Address: [address]          [place], [place] [address] [name] of [place] Phone: [phone] Mobile Phone: [phone] Relation: Sister     Allergies: Patient has no known allergies.  Chief Complaint  Patient presents with  . New Admit To SNF    HPI: Patient is 83 y.o. female with diabetes, hypertension, breast cancer, presented to the emergency department with complaints of weakness. Patient had fallen 2 days prior. She does open her daughter. Patient was in her bed today, and states she was unable to get out of bed. Per emergency room documentation, patient's daughter had heard a thump and found her mother on the floor. Patient had reported feeling swimmy headed. No complaints of headache.  Pt  denied any chest pain, shortness of breath, abdominal pain, nausea or vomiting, diarrhea constipation, sick contacts or recent travel. She does not recall the events of the last few hours. Patient does use a walker for ambulation. Pt's Ct was suggestive of infarct. Pt was admitted to [facility] from 12/30- 1/3 where an MRI was neg for CVA but did show severe spinal stenosis woith  With cervical spinal cord mass effect. N'Surg rec no acute intervention, rec OT/PT. Pt is admitted to SNF for rehab. While at SNF pt will be followed for CHF, tx with benazapril, PAF, tx with amiodarone and eliquis and dementia ,tx with aricept.  Past Medical History:  Diagnosis Date  . Arthritis   . Cancer [facility])    breast, left; s/p  lumpectomy/chemo/radiation  . Depression   . Diabetes mellitus without complication ([place])   . Hyperlipidemia   . Hypertension   . Paroxysmal atrial fibrillation ([facility])   . Presence of permanent cardiac pacemaker   . Renal disorder     Past Surgical History:  Procedure Laterality Date  . BREAST LUMPECTOMY Left   . CARDIAC PACEMAKER PLACEMENT    . CATARACT EXTRACTION    . EYE SURGERY    . kidney stone removal     x3  . LAPAROSCOPIC NEPHRECTOMY Left 07/29/2015   Procedure: LEFT LAPAROSCOPIC SIMPLE NEPHRECTOMY;  Surgeon: Ardis Hughs, MD;  Location: [facility]S;  Service: Urology;  Laterality: Left;  . LUMBAR SPINE SURGERY     laminectomy and rod    Allergies as of 02/28/2016   No Known Allergies     Medication List       Accurate as of 02/28/16  1:47 PM. Always use your most recent med list.          allopurinol 300 MG tablet Commonly known as:  ZYLOPRIM TAKE ONE TABLET BY MOUTH ONCE DAILY   amiodarone 200 MG tablet Commonly known as:  PACERONE Take 1 tablet (200 mg total) by mouth daily.   amLODipine 5 MG tablet Commonly known as:  NORVASC Take 1 tablet (5 mg total) by mouth daily.   apixaban 2.5 MG Tabs tablet Commonly known as:  ELIQUIS Take 1 tablet (2.5 mg total) by mouth 2 (two) times daily.   atorvastatin 80 MG tablet Commonly known as:  LIPITOR Take 1 tablet (80 mg total) by mouth daily with supper.   benazepril 10 MG tablet Commonly known as:  LOTENSIN Take 1 tablet (10 mg total) by mouth daily.   Cyanocobalamin 1500 MCG Tbdp Take 1,500 mcg by mouth daily with lunch.   D3-1000 PO Take 1 tablet by mouth daily with lunch.   donepezil 10 MG tablet Commonly known as:  ARICEPT Take 1 tablet (10 mg total) by mouth at bedtime.   feeding supplement (GLUCERNA SHAKE) Liqd Take 237 mLs by mouth 2 (two) times daily between meals.   glucose blood test strip Commonly known as:  ONE TOUCH TEST STRIPS Use as instructed to check blood sugar. DX E11.9     insulin lispro 100 UNIT/ML injection Commonly known as:  HUMALOG Inject 0-0.1 mls (0-10 units total) into the skin 3 times daily before meals. Sliding Scale. Give 5 units if greater than 200.   INSULIN SYRINGE .5CC/31GX5/16" 31G X 5/16" 0.5 ML Misc   onetouch ultrasoft lancets Use as instructed to check blood sugar daily. DX E11.9   WOMENS MULTI VITAMIN & MINERAL PO Take 1 tablet by mouth daily with lunch.       No orders of the defined types were placed in this encounter.   Immunization History  Administered Date(s) Administered  . Influenza, High Dose Seasonal PF 12/30/2015  . PPD Test 04/26/2015, 08/02/2015    Social History  Substance Use Topics  . Smoking status: Never Smoker  . Smokeless tobacco: Never Used  . Alcohol use No    Family history is   Family History  Problem Relation Age of Onset  . Cancer Mother     pancreatic  . Diabetes Sister   . Hypertension Sister   . Hypertension Brother       Review of Systems  DATA OBTAINED: from patient GENERAL:  no fevers, fatigue, appetite changes SKIN: No itching, or rash EYES: No eye pain, redness, discharge EARS: No earache, tinnitus, change in hearing NOSE: No congestion, drainage or bleeding  MOUTH/THROAT: No mouth or tooth pain, No sore throat RESPIRATORY: No cough, wheezing, SOB CARDIAC: No chest pain, palpitations, lower extremity edema  GI: No abdominal pain, No N/V/D or constipation, No heartburn or reflux  GU: No dysuria, frequency or urgency, or incontinence  MUSCULOSKELETAL: No unrelieved bone/joint pain NEUROLOGIC: No headache, dizziness ; R arm numbness and weakness for several months PSYCHIATRIC: No c/o anxiety or sadness   Vitals:   02/28/16 1334  BP: 135/65  Pulse: 60  Resp: 18  Temp: 97.6 F (36.4 C)    SpO2 Readings from Last 1 Encounters:  02/28/16 98%   Body mass index is 20.05 kg/m.     Physical Exam  GENERAL APPEARANCE: Alert, conversant,  No acute distress.  SKIN:  No diaphoresis rash HEAD: Normocephalic, atraumatic  EYES: Conjunctiva/lids clear. Pupils round, reactive. EOMs intact.  EARS: External exam WNL, canals clear. Hearing grossly normal.  NOSE: No deformity or discharge.  MOUTH/THROAT: Lips w/o lesions  RESPIRATORY: Breathing is even, unlabored. Lung sounds are clear   CARDIOVASCULAR: Heart RRR no murmurs, rubs or gallops. trace peripheral edema.   GASTROINTESTINAL: Abdomen is soft, non-tender, not distended w/ normal bowel sounds. GENITOURINARY: Bladder non tender, not distended  MUSCULOSKELETAL: No abnormal joints or musculature NEUROLOGIC:  Cranial nerves 2-12 grossly intact. Moves all extremities; Mild RUE weakness 4/5 PSYCHIATRIC: Mood and affect appropriate to situation with dementia, no behavioral issues  Patient Active Problem List   Diagnosis Date Noted  .  Myelopathy ([place])   . Weakness 02/19/2016  . Diaphragmatic injury as surgical complication 24/23/5361  . Postoperative anemia due to acute blood loss 08/14/2015  . Dementia without behavioral disturbance 08/14/2015  . Chronic UTI 07/29/2015  . 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 Klebsiella sepsis ([place]) 05/02/2015  . CAP (community acquired pneumonia) 05/02/2015  . Transaminasemia   . Sepsis ([place]) 04/22/2015  . Paroxysmal atrial fibrillation ([place]) 04/22/2015  . Delirium   . Urinary tract infectious disease   . Osteoarthritis 03/27/2015  . Cerebrovascular accident (CVA) due to thrombosis of cerebral artery ([place]) 03/23/2015  . Moderate tricuspid regurgitation 03/11/2015  . Cardiomyopathy ([place]), EF 40-45%  02/2015 03/11/2015  . Mild mitral regurgitation 03/11/2015  . Pulmonary arterial hypertension ([place]), mild 03/11/2015  . Mild cognitive impairment 02/24/2015  . Type 2 diabetes mellitus without complication, with long-term current use of insulin ([place]) 02/24/2015  . Hypertensive heart disease with CHF (congestive heart failure) ([place]) 02/24/2015  . Essential  hypertension, benign 02/21/2015  . Hyperlipidemia 02/19/2015  . Gout 02/19/2015  . PAD (peripheral artery disease) ([place]) 02/19/2015  . Cardiac pacemaker in situ 02/19/2015  . CKD (chronic kidney disease) 02/19/2015      Labs reviewed: Basic Metabolic Panel:    Component Value Date/Time   NA 138 02/23/2016 0605   NA 138 02/23/2016   K 4.1 02/23/2016 0605   CL 101 02/23/2016 0605   CO2 33 (H) 02/23/2016 0605   GLUCOSE 139 (H) 02/23/2016 0605   BUN 19 02/23/2016 0605   BUN 19 02/23/2016   CREATININE 1.12 (H) 02/23/2016 0605   CALCIUM 8.8 (L) 02/23/2016 0605   PROT 7.0 06/21/2015 1317   ALBUMIN 3.3 (L) 06/21/2015 1317   AST 39 06/21/2015 1317   ALT 47 06/21/2015 1317   ALKPHOS 110 06/21/2015 1317   BILITOT 0.4 06/21/2015 1317   GFRNONAA 44 (L) 02/23/2016 0605   GFRAA 52 (L) 02/23/2016 0605     Recent Labs  02/19/16 1318 02/19/16 1633 02/21/16 0449 02/23/16 02/23/16 0605  NA 136  --  137 138 138  K 3.2*  --  3.3*  --  4.1  CL 97*  --  98*  --  101  CO2 30  --  32  --  33*  GLUCOSE 183*  --  132*  --  139*  BUN 26*  --  21* 19 19  CREATININE 1.21*  --  1.12* 1.1 1.12*  CALCIUM 9.1  --  9.0  --  8.8*  MG  --  1.8  --   --   --    Liver Function Tests:  Recent Labs  04/26/15 0530 05/19/15 1022 06/21/15 1317  AST 102* 36 39  ALT 155* 30 47  ALKPHOS 76 120* 110  BILITOT 0.4 0.5 0.4  PROT 6.0* 7.3 7.0  ALBUMIN 2.2* 3.4* 3.3*   No results for input(s): LIPASE, AMYLASE in the last 8760 hours. No results for input(s): AMMONIA in the last 8760 hours. CBC:  Recent Labs  04/22/15 2245  06/21/15 1317  08/01/15 0926 02/19/16 1318 02/23/16 02/23/16 0605  WBC 7.0  < > 6.0  < > 14.6* 8.6 7.4 7.4  NEUTROABS 4.6  --  3.7  --  13.3*  --   --   --   HGB 11.4*  < > 11.8*  < > 11.3* 14.2 12.3 12.3  HCT 34.2*  < > 36.1  < > 33.2* 41.7 37 36.7  MCV 91.7  < > 94.5  < > 91.2  90.7  --  92.0  PLT 156  < > 221  < > 190 218 215 215  < > = values in this interval not  displayed. Lipid  Recent Labs  02/20/16 0440  CHOL 222*  HDL 74  LDLCALC 133*  TRIG 74    Cardiac Enzymes:  Recent Labs  02/19/16 1827 02/20/16 0010 02/20/16 0440  TROPONINI 0.05* 0.05* 0.05*   BNP: No results for input(s): BNP in the last 8760 hours. No results found for: [facility] Lab Results  Component Value Date   HGBA1C 7.3 (H) 02/20/2016   Lab Results  Component Value Date   TSH 1.092 02/19/2016   Lab Results  Component Value Date   VITAMINB12 1,998 (H) 02/19/2016   Lab Results  Component Value Date   FOLATE 26.8 02/19/2016   No results found for: IRON, TIBC, FERRITIN  Imaging and Procedures obtained prior to SNF admission: Dg Chest 2 View  Result Date: 02/19/2016 CLINICAL DATA:  Cerebral vascular accident. EXAM: CHEST  2 VIEW COMPARISON:  Radiograph July 29, 2015. FINDINGS: The heart size and mediastinal contours are within normal limits. Right-sided pacemaker is unchanged in position. Atherosclerosis of thoracic aorta is noted. No pneumothorax or pleural effusion is noted. Both lungs are clear. The visualized skeletal structures are unremarkable. IMPRESSION: No active cardiopulmonary disease.  Aortic atherosclerosis. Electronically Signed   By: Marijo Conception, M.D.   On: 02/19/2016 20:58   Ct Head Wo Contrast  Result Date: 02/19/2016 CLINICAL DATA:  Weakness. EXAM: CT HEAD WITHOUT CONTRAST TECHNIQUE: Contiguous axial images were obtained from the base of the skull through the vertex without intravenous contrast. COMPARISON:  CT scan of April 14, 2015. FINDINGS: Brain: Mild chronic ischemic white matter disease is noted. No mass effect or midline shift is noted. Ventricular size is within normal limits. There is no evidence of mass lesion or hemorrhage. There is interval development of low density involving the left occipital lobe concerning for infarction of indeterminate age. Vascular: No hyperdense vessel or unexpected calcification. Skull: Normal.  Negative for fracture or focal lesion. Sinuses/Orbits: No acute finding. Other: None. IMPRESSION: Interval development of left occipital low density concerning for infarction of indeterminate age. MRI is recommended for further evaluation. Electronically Signed   By: Marijo Conception, M.D.   On: 02/19/2016 15:13     Not all labs, radiology exams or other studies done during hospitalization come through on my EPIC note; however they are reviewed by me.    Assessment and Plan  GENERALIZED WEAKNESS - CT head showed interval development of left occipital low density concerning for infarction of indeterminate age;MRI finally obtained 1/1 and showed no acute CVA, but did show widespread severe degenerative cervical spinal stenosis with severe cervical spinal cord mass effect.  SNF - admitted for OT/PT  SEVERE CERVICAL SPINAL STENOSIS WITH SPINAL CORD MASS EFFECT  --N'surg rec OT/PT SNF admitted for OT/PT; pt will mild r arm weakness 2/2  -  CHRONIC SYSTOLIC CHF-repeat 2D echo with EF 20-25% from 40% at the beginning of the year SNF - euvolemic , cont benazopril 10 mg daily; will follow weights  HYPOKALEMIA SNF - d/c K+4.1; will f/u BMP  HTN SNF - stable; cont norvasc 5 mg and benazopril 10 mg daily  PAF SNF - atrially paced; cont amiodarone 200 mg daily and eliquis 2.5 mg BID  DEMENTIA  SNF - stable; cont aricept 10 mg qHS  DM2 SNF - 5 unts ac for BS> 200; pt on ACE  statin  CKD3 SNF - stable; will f/u BMP   Time spent > 67mn;> 50% of time with patient was spent reviewing records, labs, tests and studies, counseling and developing plan of care  AInocencio Homes, MD

## 2016-02-28 NOTE — Telephone Encounter (Signed)
Family never called back. Per chart pt has appt w/MD 02/29/16. Closing encounter...Allison Dunn

## 2016-02-29 ENCOUNTER — Ambulatory Visit: Payer: Medicare Other | Admitting: Internal Medicine

## 2016-03-01 ENCOUNTER — Encounter: Payer: Self-pay | Admitting: Internal Medicine

## 2016-03-01 DIAGNOSIS — E876 Hypokalemia: Secondary | ICD-10-CM | POA: Insufficient documentation

## 2016-03-01 DIAGNOSIS — M4802 Spinal stenosis, cervical region: Secondary | ICD-10-CM | POA: Insufficient documentation

## 2016-03-01 DIAGNOSIS — M4710 Other spondylosis with myelopathy, site unspecified: Secondary | ICD-10-CM | POA: Insufficient documentation

## 2016-03-01 DIAGNOSIS — G3189 Other specified degenerative diseases of nervous system: Secondary | ICD-10-CM

## 2016-03-01 DIAGNOSIS — G9529 Other cord compression: Secondary | ICD-10-CM

## 2016-03-01 HISTORY — DX: Spinal stenosis, cervical region: M48.02

## 2016-03-01 HISTORY — DX: Other spondylosis with myelopathy, site unspecified: M47.10

## 2016-03-08 ENCOUNTER — Ambulatory Visit: Payer: Medicare Other | Admitting: Internal Medicine

## 2016-03-14 ENCOUNTER — Encounter: Payer: Self-pay | Admitting: Internal Medicine

## 2016-03-14 ENCOUNTER — Non-Acute Institutional Stay (SKILLED_NURSING_FACILITY): Payer: Medicare Other | Admitting: Internal Medicine

## 2016-03-14 DIAGNOSIS — Z794 Long term (current) use of insulin: Secondary | ICD-10-CM | POA: Diagnosis not present

## 2016-03-14 DIAGNOSIS — E1165 Type 2 diabetes mellitus with hyperglycemia: Secondary | ICD-10-CM

## 2016-03-14 NOTE — Progress Notes (Signed)
Location:  [facility] Room Number: [phone] Place of Service:  SNF [phone])  Allison Dunn. Sheppard Coil, MD  Patient Care Team: Binnie Rail, MD as PCP - General (Internal Medicine) Jola Schmidt, MD as Consulting Physician (Ophthalmology) Cameron Sprang, MD as Consulting Physician (Neurology) Evans Lance, MD as Consulting Physician (Cardiology)  Extended Emergency Contact Information Primary Emergency Contact: [name],[name] Address: [address]          [place], [place] [address] [name] of [place] Phone: [phone] Mobile Phone: [phone] Relation: Sister    Allergies: Patient has no known allergies.  Chief Complaint  Patient presents with  . Acute Visit    Acute    HPI: Patient is 83 y.o. female who nursing asked me to see for consistently elevated BS, esp in the afternoon. Pt has has no c/o or symptoms.  Past Medical History:  Diagnosis Date  . Arthritis   . Cancer [facility])    breast, left; s/p lumpectomy/chemo/radiation  . Depression   . Diabetes mellitus without complication ([place])   . Hyperlipidemia   . Hypertension   . Paroxysmal atrial fibrillation (H[facility])   . Presence of permanent cardiac pacemaker   . Renal disorder     Past Surgical History:  Procedure Laterality Date  . BREAST LUMPECTOMY Left   . CARDIAC PACEMAKER PLACEMENT    . CATARACT EXTRACTION    . EYE SURGERY    . kidney stone removal     x3  . LAPAROSCOPIC NEPHRECTOMY Left 07/29/2015   Procedure: LEFT LAPAROSCOPIC SIMPLE NEPHRECTOMY;  Surgeon: Ardis Hughs, MD;  Location: [facility] ORS;  Service: Urology;  Laterality: Left;  . LUMBAR SPINE SURGERY     laminectomy and rod    Allergies as of 03/14/2016   No Known Allergies     Medication List       Accurate as of 03/14/16  1:57 PM. Always use your most recent med list.          allopurinol 300 MG tablet Commonly known as:  ZYLOPRIM TAKE ONE TABLET BY MOUTH ONCE DAILY   amiodarone 200 MG  tablet Commonly known as:  PACERONE Take 1 tablet (200 mg total) by mouth daily.   amLODipine 5 MG tablet Commonly known as:  NORVASC Take 1 tablet (5 mg total) by mouth daily.   apixaban 2.5 MG Tabs tablet Commonly known as:  ELIQUIS Take 1 tablet (2.5 mg total) by mouth 2 (two) times daily.   atorvastatin 80 MG tablet Commonly known as:  LIPITOR Take 1 tablet (80 mg total) by mouth daily with supper.   benazepril 10 MG tablet Commonly known as:  LOTENSIN Take 1 tablet (10 mg total) by mouth daily.   Cyanocobalamin 1500 MCG Tbdp Take 1,500 mcg by mouth daily with lunch.   D3-1000 PO Take 1 tablet by mouth daily with lunch.   donepezil 10 MG tablet Commonly known as:  ARICEPT Take 1 tablet (10 mg total) by mouth at bedtime.   feeding supplement (GLUCERNA SHAKE) Liqd Take 237 mLs by mouth 2 (two) times daily between meals.   glucose blood test strip Commonly known as:  ONE TOUCH TEST STRIPS Use as instructed to check blood sugar. DX E11.9   insulin lispro 100 UNIT/ML injection Commonly known as:  HUMALOG Inject 0-0.1 mls (0-10 units total) into the skin 3 times daily before meals. Sliding Scale. Give 5 units if greater than 200.   INSULIN SYRINGE .5CC/31GX5/16" 31G X 5/16"  0.5 ML Misc   onetouch ultrasoft lancets Use as instructed to check blood sugar daily. DX E11.9   WOMENS MULTI VITAMIN & MINERAL PO Take 1 tablet by mouth daily with lunch.       No orders of the defined types were placed in this encounter.   Immunization History  Administered Date(s) Administered  . Influenza, High Dose Seasonal PF 12/30/2015  . PPD Test 04/26/2015, 08/02/2015    Social History  Substance Use Topics  . Smoking status: Never Smoker  . Smokeless tobacco: Never Used  . Alcohol use No    Review of Systems  DATA OBTAINED: from patient GENERAL:  no fevers, fatigue, appetite changes SKIN: No itching, rash HEENT: No complaint RESPIRATORY: No cough, wheezing,  SOB CARDIAC: No chest pain, palpitations, lower extremity edema  GI: No abdominal pain, No N/V/D or constipation, No heartburn or reflux  GU: No dysuria, frequency or urgency, or incontinence  MUSCULOSKELETAL: No unrelieved bone/joint pain NEUROLOGIC: No headache, dizziness  PSYCHIATRIC: No overt anxiety or sadness  Vitals:   03/14/16 1353  BP: (!) 163/69  Pulse: 66  Resp: 18  Temp: 97.7 F (36.5 C)   Body mass index is 22.71 kg/m. Physical Exam  GENERAL APPEARANCE: Alert, conversant, No acute distress  SKIN: No diaphoresis rash HEENT: Unremarkable RESPIRATORY: Breathing is even, unlabored. Lung sounds are clear   CARDIOVASCULAR: Heart RRR no murmurs, rubs or gallops. No peripheral edema  GASTROINTESTINAL: Abdomen is soft, non-tender, not distended w/ normal bowel sounds.  GENITOURINARY: Bladder non tender, not distended  MUSCULOSKELETAL: No abnormal joints or musculature NEUROLOGIC: Cranial nerves 2-12 grossly intact;mild RUE weakness PSYCHIATRIC: Mood and affect appropriate to situation with dementia, no behavioral issues  Patient Active Problem List   Diagnosis Date Noted  . Degenerative cervical spinal stenosis 03/01/2016  . Spinal cord compression due to degenerative disorder of spinal column ([place]) 03/01/2016  . Hypokalemia 03/01/2016  . Myelopathy ([place])   . Generalized weakness 02/19/2016  . Diaphragmatic injury as surgical complication 82/50/5397  . Postoperative anemia due to acute blood loss 08/14/2015  . Dementia without behavioral disturbance 08/14/2015  . Chronic UTI 07/29/2015  . 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 Klebsiella sepsis ([place]) 05/02/2015  . CAP (community acquired pneumonia) 05/02/2015  . Transaminasemia   . Sepsis ([place]) 04/22/2015  . Paroxysmal atrial fibrillation ([place]) 04/22/2015  . Delirium   . Urinary tract infectious disease   . Osteoarthritis 03/27/2015  . Cerebrovascular accident (CVA) due to thrombosis of cerebral artery  ([place]) 03/23/2015  . Moderate tricuspid regurgitation 03/11/2015  . Cardiomyopathy ([place]), EF 40-45%  02/2015 03/11/2015  . Mild mitral regurgitation 03/11/2015  . Pulmonary arterial hypertension ([place]), mild 03/11/2015  . Mild cognitive impairment 02/24/2015  . Type 2 diabetes mellitus without complication, with long-term current use of insulin ([place]) 02/24/2015  . Hypertensive heart disease with CHF (congestive heart failure) ([place]) 02/24/2015  . Diabetes ([place]) 02/21/2015  . Essential hypertension, benign 02/21/2015  . Hyperlipidemia 02/19/2015  . Gout 02/19/2015  . PAD (peripheral artery disease) ([place]) 02/19/2015  . Cardiac pacemaker in situ 02/19/2015  . CKD (chronic kidney disease) 02/19/2015    CMP     Component Value Date/Time   NA 138 02/23/2016 0605   NA 138 02/23/2016   K 4.1 02/23/2016 0605   CL 101 02/23/2016 0605   CO2 33 (H) 02/23/2016 0605   GLUCOSE 139 (H) 02/23/2016 0605   BUN 19 02/23/2016 0605   BUN 19 02/23/2016   CREATININE 1.12 (H) 02/23/2016 6734  CALCIUM 8.8 (L) 02/23/2016 0605   PROT 7.0 06/21/2015 1317   ALBUMIN 3.3 (L) 06/21/2015 1317   AST 39 06/21/2015 1317   ALT 47 06/21/2015 1317   ALKPHOS 110 06/21/2015 1317   BILITOT 0.4 06/21/2015 1317   GFRNONAA 44 (L) 02/23/2016 0605   GFRAA 52 (L) 02/23/2016 0605    Recent Labs  02/19/16 1318 02/19/16 1633 02/21/16 0449 02/23/16 02/23/16 0605  NA 136  --  137 138 138  K 3.2*  --  3.3*  --  4.1  CL 97*  --  98*  --  101  CO2 30  --  32  --  33*  GLUCOSE 183*  --  132*  --  139*  BUN 26*  --  21* 19 19  CREATININE 1.21*  --  1.12* 1.1 1.12*  CALCIUM 9.1  --  9.0  --  8.8*  MG  --  1.8  --   --   --     Recent Labs  04/26/15 0530 05/19/15 1022 06/21/15 1317  AST 102* 36 39  ALT 155* 30 47  ALKPHOS 76 120* 110  BILITOT 0.4 0.5 0.4  PROT 6.0* 7.3 7.0  ALBUMIN 2.2* 3.4* 3.3*    Recent Labs  04/22/15 2245  06/21/15 1317  08/01/15 0926 02/19/16 1318 02/23/16 02/23/16 0605  WBC 7.0  <  > 6.0  < > 14.6* 8.6 7.4 7.4  NEUTROABS 4.6  --  3.7  --  13.3*  --   --   --   HGB 11.4*  < > 11.8*  < > 11.3* 14.2 12.3 12.3  HCT 34.2*  < > 36.1  < > 33.2* 41.7 37 36.7  MCV 91.7  < > 94.5  < > 91.2 90.7  --  92.0  PLT 156  < > 221  < > 190 218 215 215  < > = values in this interval not displayed.  Recent Labs  02/20/16 0440  CHOL 222*  LDLCALC 133*  TRIG 74   No results found for: [facility] Lab Results  Component Value Date   TSH 1.092 02/19/2016   Lab Results  Component Value Date   HGBA1C 7.3 (H) 02/20/2016   Lab Results  Component Value Date   CHOL 222 (H) 02/20/2016   HDL 74 02/20/2016   LDLCALC 133 (H) 02/20/2016   TRIG 74 02/20/2016   CHOLHDL 3.0 02/20/2016    Significant Diagnostic Results in last 30 days:  Dg Chest 2 View  Result Date: 02/19/2016 CLINICAL DATA:  Cerebral vascular accident. EXAM: CHEST  2 VIEW COMPARISON:  Radiograph July 29, 2015. FINDINGS: The heart size and mediastinal contours are within normal limits. Right-sided pacemaker is unchanged in position. Atherosclerosis of thoracic aorta is noted. No pneumothorax or pleural effusion is noted. Both lungs are clear. The visualized skeletal structures are unremarkable. IMPRESSION: No active cardiopulmonary disease.  Aortic atherosclerosis. Electronically Signed   By: Marijo Conception, M.D.   On: 02/19/2016 20:58   Ct Head Wo Contrast  Result Date: 02/19/2016 CLINICAL DATA:  Weakness. EXAM: CT HEAD WITHOUT CONTRAST TECHNIQUE: Contiguous axial images were obtained from the base of the skull through the vertex without intravenous contrast. COMPARISON:  CT scan of April 14, 2015. FINDINGS: Brain: Mild chronic ischemic white matter disease is noted. No mass effect or midline shift is noted. Ventricular size is within normal limits. There is no evidence of mass lesion or hemorrhage. There is interval development of low density involving the  left occipital lobe concerning for infarction of indeterminate  age. Vascular: No hyperdense vessel or unexpected calcification. Skull: Normal. Negative for fracture or focal lesion. Sinuses/Orbits: No acute finding. Other: None. IMPRESSION: Interval development of left occipital low density concerning for infarction of indeterminate age. MRI is recommended for further evaluation. Electronically Signed   By: Marijo Conception, M.D.   On: 02/19/2016 15:13   Mr Virgel Paling PX Contrast  Result Date: 02/21/2016 CLINICAL DATA:  83 year old female with generalized weakness and recent falls. Right arm weakness. Query stroke. Initial encounter. EXAM: MRI HEAD WITHOUT CONTRAST MRA HEAD WITHOUT CONTRAST TECHNIQUE: Multiplanar, multiecho pulse sequences of the brain and surrounding structures were obtained without intravenous contrast. Angiographic images of the head were obtained using MRA technique without contrast. COMPARISON:  Head CTs without contrast 02/19/2016 and earlier. FINDINGS: MRI HEAD FINDINGS Brain: Chronic lacunar infarct along the ventral left thalamus. Small to moderate area of cortical encephalomalacia in the left occipital pole with some laminar necrosis. Small chronic lacunar infarct in the left central brainstem at the junction of the midbrain and pons (series 6, image 9). Normal signal throughout the remaining lower brainstem. There is a tiny chronic cortical infarct at the right superior frontal gyrus motor strip (series 8, image 20). Minimal for age nonspecific cerebral white matter signal changes. There are fairly symmetric signal changes in the cerebellar peduncle is with T2 and FLAIR hyperintensity (series 6 image 7). There is associated increased trace diffusion signal, but this does not appear restricted (series 4, image 11 and series 400, image 11). The cerebellum otherwise appears normal. No convincing restricted diffusion to suggest acute infarction. No midline shift, mass effect, evidence of mass lesion, ventriculomegaly, extra-axial collection or acute  intracranial hemorrhage. Cervicomedullary junction and pituitary are within normal limits. Vascular: Major intracranial vascular flow voids are remarkable for loss of the distal right vertebral artery flow void. The distal left vertebral artery appears normal, and the basilar artery flow void is maintained. Other Major intracranial vascular flow voids are preserved. Skull and upper cervical spine: Multilevel severe degenerative appearing cervical spinal stenosis and severe spinal cord mass effect (series 3, image 12). Widespread severe cervical disc space loss. Normal bone marrow signal. Sinuses/Orbits: Normal orbits aside from postoperative changes to both globes. Visualized paranasal sinuses and mastoids are stable and well pneumatized. Other: Mild mastoid effusions greater on the left. Negative nasopharynx. Grossly normal visualized other internal auditory structures. Negative scalp soft tissues. MRA HEAD FINDINGS Antegrade flow in the distal left vertebral artery which is irregular but without significant stenosis. Minimal antegrade flow signal in the distal right vertebral artery. There is probably retrograde supply to the right V4 segment and right PICA from the vertebrobasilar junction. The left PICA origin is patent. The basilar artery is irregular but without hemodynamically significant stenosis. Both SCA and PCA origins are patent although there is moderate to severe left and mild-to-moderate right widespread P1 and P2 segment irregularity with stenosis. Distal left PCA branches are attenuated. Posterior communicating arteries are diminutive or absent. Antegrade flow in both ICA siphons with severe irregularity throughout the right cavernous and supraclinoid segments. Associated moderate or severe stenosis perhaps most pronounced at the distal cavernous segment. The right ICA terminus remains patent. The right MCA origin is normal. Left siphon antegrade flow with up to moderate irregularity in the  cavernous and supraclinoid segments but only mild stenosis. Normal left ICA terminus, left MCA and ACA origins. The right A1 is diminutive or absent. The anterior communicating artery  and visualized ACA branches are within normal limits. MCA M1 segments and bifurcations are patent without stenosis. No MCA branch occlusion is identified. Mild bilateral MCA branch irregularity. IMPRESSION: 1. Widespread severe degenerative cervical spinal stenosis with severe cervical spinal cord mass effect. See series 3, image 12. 2. Chronic appearing occlusion or near-occlusion of the distal Right Vertebral artery and other severe intracranial atherosclerosis, including severe stenosis of the Right ICA siphon and Left PCA. 3. No definite acute infarct; unusual, symmetric signal changes in the bilateral cerebellar peduncles without strong evidence of acute infarct. Chronic infarcts in the left PCA territory with laminar necrosis. Chronic right brainstem lacune at the pons midbrain junction, and tiny chronic right superior motor strip infarct. Electronically Signed   By: Genevie Ann M.D.   On: 02/21/2016 12:41   Mr Brain Wo Contrast  Result Date: 02/21/2016 CLINICAL DATA:  83 year old female with generalized weakness and recent falls. Right arm weakness. Query stroke. Initial encounter. EXAM: MRI HEAD WITHOUT CONTRAST MRA HEAD WITHOUT CONTRAST TECHNIQUE: Multiplanar, multiecho pulse sequences of the brain and surrounding structures were obtained without intravenous contrast. Angiographic images of the head were obtained using MRA technique without contrast. COMPARISON:  Head CTs without contrast 02/19/2016 and earlier. FINDINGS: MRI HEAD FINDINGS Brain: Chronic lacunar infarct along the ventral left thalamus. Small to moderate area of cortical encephalomalacia in the left occipital pole with some laminar necrosis. Small chronic lacunar infarct in the left central brainstem at the junction of the midbrain and pons (series 6, image 9).  Normal signal throughout the remaining lower brainstem. There is a tiny chronic cortical infarct at the right superior frontal gyrus motor strip (series 8, image 20). Minimal for age nonspecific cerebral white matter signal changes. There are fairly symmetric signal changes in the cerebellar peduncle is with T2 and FLAIR hyperintensity (series 6 image 7). There is associated increased trace diffusion signal, but this does not appear restricted (series 4, image 11 and series 400, image 11). The cerebellum otherwise appears normal. No convincing restricted diffusion to suggest acute infarction. No midline shift, mass effect, evidence of mass lesion, ventriculomegaly, extra-axial collection or acute intracranial hemorrhage. Cervicomedullary junction and pituitary are within normal limits. Vascular: Major intracranial vascular flow voids are remarkable for loss of the distal right vertebral artery flow void. The distal left vertebral artery appears normal, and the basilar artery flow void is maintained. Other Major intracranial vascular flow voids are preserved. Skull and upper cervical spine: Multilevel severe degenerative appearing cervical spinal stenosis and severe spinal cord mass effect (series 3, image 12). Widespread severe cervical disc space loss. Normal bone marrow signal. Sinuses/Orbits: Normal orbits aside from postoperative changes to both globes. Visualized paranasal sinuses and mastoids are stable and well pneumatized. Other: Mild mastoid effusions greater on the left. Negative nasopharynx. Grossly normal visualized other internal auditory structures. Negative scalp soft tissues. MRA HEAD FINDINGS Antegrade flow in the distal left vertebral artery which is irregular but without significant stenosis. Minimal antegrade flow signal in the distal right vertebral artery. There is probably retrograde supply to the right V4 segment and right PICA from the vertebrobasilar junction. The left PICA origin is  patent. The basilar artery is irregular but without hemodynamically significant stenosis. Both SCA and PCA origins are patent although there is moderate to severe left and mild-to-moderate right widespread P1 and P2 segment irregularity with stenosis. Distal left PCA branches are attenuated. Posterior communicating arteries are diminutive or absent. Antegrade flow in both ICA siphons with severe irregularity throughout  the right cavernous and supraclinoid segments. Associated moderate or severe stenosis perhaps most pronounced at the distal cavernous segment. The right ICA terminus remains patent. The right MCA origin is normal. Left siphon antegrade flow with up to moderate irregularity in the cavernous and supraclinoid segments but only mild stenosis. Normal left ICA terminus, left MCA and ACA origins. The right A1 is diminutive or absent. The anterior communicating artery and visualized ACA branches are within normal limits. MCA M1 segments and bifurcations are patent without stenosis. No MCA branch occlusion is identified. Mild bilateral MCA branch irregularity. IMPRESSION: 1. Widespread severe degenerative cervical spinal stenosis with severe cervical spinal cord mass effect. See series 3, image 12. 2. Chronic appearing occlusion or near-occlusion of the distal Right Vertebral artery and other severe intracranial atherosclerosis, including severe stenosis of the Right ICA siphon and Left PCA. 3. No definite acute infarct; unusual, symmetric signal changes in the bilateral cerebellar peduncles without strong evidence of acute infarct. Chronic infarcts in the left PCA territory with laminar necrosis. Chronic right brainstem lacune at the pons midbrain junction, and tiny chronic right superior motor strip infarct. Electronically Signed   By: Genevie Ann M.D.   On: 02/21/2016 12:41   Mr Cervical Spine Wo Contrast  Result Date: 02/22/2016 CLINICAL DATA:  Fall. Progressive right upper extremity weakness after fall.  Right shoulder pain. EXAM: MRI CERVICAL SPINE WITHOUT CONTRAST TECHNIQUE: Multiplanar, multisequence MR imaging of the cervical spine was performed. No intravenous contrast was administered. COMPARISON:  MRI of the brain 02/21/2016 FINDINGS: Alignment: Slight retrolisthesis is present at C2-3 and C3-4. AP alignment is otherwise anatomic. Vertebrae: Chronic endplate marrow changes are present at C2-3, C3-4, and C4-5. Edematous changes are noted in the facet joints on the left at C3-4 and C4-5. Cord: Increased T2 cord signal is present from C3 through the C5. No mass lesion is present. Posterior Fossa, vertebral arteries, paraspinal tissues: The craniocervical junction is within normal limits. The visualized intracranial contents are normal. Flow is present in the vertebral arteries. The visualized paraspinous soft tissues are within normal limits. Disc levels: C2-3: A broad-based disc osteophyte complex effaces the ventral CSF. The central canal is narrowed to 5 mm. Severe left and moderate right foraminal stenosis is present. C3-4: Severe central canal stenosis is present. The canal is narrowed to less than 4 mm. Severe foraminal stenosis is worse on the right. C4-5: Severe central canal stenosis is present. The canal is narrowed to less than 3 mm. Severe foraminal stenosis is worse on the left. C5-6: A broad-based disc osteophyte complex effaces the ventral CSF. Severe left and moderate right foraminal stenosis is present. C6-7: The central canal is patent. Severe right and moderate left foraminal stenosis is present. C7-T1: A broad-based disc osteophyte complex is present. No significant stenosis is evident. Facet hypertrophy is worse on the right. Mild endplate changes are present at T2-3 and T3-4. Uncovertebral and facet spurring results in moderate foraminal narrowing at these 2 levels, right greater than left. IMPRESSION: 1. Severe central canal stenosis at C2-3, C3-4, and particularly C4-5. 2. Severe left and  moderate right foraminal stenosis at C2-3. 3. Severe foraminal narrowing at C3-4 is worse on the right. 4. Severe foraminal stenosis C4-5 is worse on the left. 5. Severe left and moderate right foraminal stenosis at C5-6. 6. Severe right and moderate left foraminal stenosis at C6-7. 7. Facet hypertrophy at C7-T1 is worse on the right. There is no significant central canal stenosis. 8. Moderate foraminal stenosis at T2-3  and T3-4 is worse on the right. Electronically Signed   By: San Morelle M.D.   On: 02/22/2016 15:09    Assessment and Plan  HYPERGLYCEMIA/ DM2 - I reviewed 19 days of BS for each meal. Pt has been on SSI alone, however pt's fasting BS are elevated so a basal insulin can be started, [place] 4u q HS; mealtime SSI has been increased also-5 u for BS> 200 and 8 u for BS> 300; will monitor.    Time spent > 25 min;> 50% of time with patient was spent reviewing records, labs, tests and studies, counseling and developing plan of care  Allison Dunn. Sheppard Coil, MD

## 2016-03-22 ENCOUNTER — Ambulatory Visit (INDEPENDENT_AMBULATORY_CARE_PROVIDER_SITE_OTHER): Payer: Medicare Other | Admitting: Internal Medicine

## 2016-03-22 ENCOUNTER — Encounter: Payer: Self-pay | Admitting: Internal Medicine

## 2016-03-22 VITALS — BP 158/62 | HR 60 | Ht 60.0 in | Wt 123.8 lb

## 2016-03-22 DIAGNOSIS — I10 Essential (primary) hypertension: Secondary | ICD-10-CM | POA: Diagnosis not present

## 2016-03-22 DIAGNOSIS — Z950 Presence of cardiac pacemaker: Secondary | ICD-10-CM

## 2016-03-22 DIAGNOSIS — R001 Bradycardia, unspecified: Secondary | ICD-10-CM | POA: Diagnosis not present

## 2016-03-22 DIAGNOSIS — R0989 Other specified symptoms and signs involving the circulatory and respiratory systems: Secondary | ICD-10-CM | POA: Diagnosis not present

## 2016-03-22 DIAGNOSIS — R05 Cough: Secondary | ICD-10-CM | POA: Diagnosis not present

## 2016-03-22 LAB — CUP PACEART INCLINIC DEVICE CHECK
Battery Voltage: 3.02 V
Brady Statistic AP VP Percent: 0.14 %
Brady Statistic AS VP Percent: 0 %
Brady Statistic AS VS Percent: 0.3 %
Brady Statistic RA Percent Paced: 99.61 %
Date Time Interrogation Session: 20180131161323
Implantable Lead Implant Date: 20160405
Implantable Lead Location: 753859
Implantable Lead Location: 753860
Implantable Pulse Generator Implant Date: 20160405
Lead Channel Impedance Value: 437 Ohm
Lead Channel Pacing Threshold Amplitude: 0.625 V
Lead Channel Sensing Intrinsic Amplitude: 1.625 mV
MDC IDC LEAD IMPLANT DT: 20160405
MDC IDC MSMT BATTERY REMAINING LONGEVITY: 98 mo
MDC IDC MSMT LEADCHNL RA IMPEDANCE VALUE: 266 Ohm
MDC IDC MSMT LEADCHNL RA IMPEDANCE VALUE: 342 Ohm
MDC IDC MSMT LEADCHNL RA PACING THRESHOLD PULSEWIDTH: 0.4 ms
MDC IDC MSMT LEADCHNL RV IMPEDANCE VALUE: 323 Ohm
MDC IDC MSMT LEADCHNL RV PACING THRESHOLD AMPLITUDE: 0.875 V
MDC IDC MSMT LEADCHNL RV PACING THRESHOLD PULSEWIDTH: 0.4 ms
MDC IDC MSMT LEADCHNL RV SENSING INTR AMPL: 4.625 mV
MDC IDC SET LEADCHNL RA PACING AMPLITUDE: 2 V
MDC IDC SET LEADCHNL RV PACING AMPLITUDE: 2 V
MDC IDC SET LEADCHNL RV PACING PULSEWIDTH: 0.4 ms
MDC IDC SET LEADCHNL RV SENSING SENSITIVITY: 2.8 mV
MDC IDC STAT BRADY AP VS PERCENT: 99.56 %
MDC IDC STAT BRADY RV PERCENT PACED: 0.15 %

## 2016-03-22 NOTE — Patient Instructions (Addendum)
Medication Instructions:  Your physician recommends that you continue on your current medications as directed. Please refer to the Current Medication list given to you today.   Labwork: None Ordered   Testing/Procedures: None Ordered   Follow-Up: Your physician wants you to follow-up in: 1 year with Dr. Lovena Le. You will receive a reminder letter in the mail two months in advance. If you don't receive a letter, please call our office to schedule the follow-up appointment.  Remote monitoring is used to monitor your Pacemaker from home. This monitoring reduces the number of office visits required to check your device to one time per year. It allows [place] to keep an eye on the functioning of your device to ensure it is working properly. You are scheduled for a device check from home on 06/21/16. You may send your transmission at any time that day. If you have a wireless device, the transmission will be sent automatically. After your physician reviews your transmission, you will receive a postcard with your next transmission date.      Any Other Special Instructions Will Be Listed Below (If Applicable).    If you need a refill on your cardiac medications before your next appointment, please call your pharmacy.

## 2016-03-22 NOTE — Progress Notes (Signed)
HPI Mrs. Allison Dunn returns today for followup. She is a elderly woman with HTN and symptomatic bradycardia, s/p PPM insertion. She has not had syncope. She has had some memory problems and vision changes. No chest pain or sob. She does note some swelling in her legs. She is very sedentary. No Known Allergies   Current Outpatient Prescriptions  Medication Sig Dispense Refill  . allopurinol (ZYLOPRIM) 300 MG tablet TAKE ONE TABLET BY MOUTH ONCE DAILY 90 tablet 0  . amiodarone (PACERONE) 200 MG tablet Take 1 tablet (200 mg total) by mouth daily. 90 tablet 1  . amLODipine (NORVASC) 5 MG tablet Take 1 tablet (5 mg total) by mouth daily. 90 tablet 3  . atorvastatin (LIPITOR) 80 MG tablet Take 1 tablet (80 mg total) by mouth daily with supper. 90 tablet 1  . benazepril (LOTENSIN) 10 MG tablet Take 1 tablet (10 mg total) by mouth daily. 90 tablet 3  . Cholecalciferol (D3-1000 PO) Take 1 tablet by mouth daily with lunch.    . Cyanocobalamin 1500 MCG TBDP Take 1,500 mcg by mouth daily with lunch.     . donepezil (ARICEPT) 10 MG tablet Take 1 tablet (10 mg total) by mouth at bedtime. 90 tablet 3  . feeding supplement, GLUCERNA SHAKE, (GLUCERNA SHAKE) LIQD Take 237 mLs by mouth 2 (two) times daily between meals.    Marland Kitchen glucose blood (ONE TOUCH TEST STRIPS) test strip Use as instructed to check blood sugar. DX E11.9 100 each 12  . insulin lispro (HUMALOG) 100 UNIT/ML injection Inject 0-0.1 mls (0-10 units total) into the skin 3 times daily before meals. Sliding Scale. Give 5 units if greater than 200.    Marland Kitchen Insulin Syringe-Needle U-100 (INSULIN SYRINGE .5CC/31GX5/16") 31G X 5/16" 0.5 ML MISC   0  . Lancets (ONETOUCH ULTRASOFT) lancets Use as instructed to check blood sugar daily. DX E11.9 100 each 12  . Multiple Vitamins-Minerals (WOMENS MULTI VITAMIN & MINERAL PO) Take 1 tablet by mouth daily with lunch.     Marland Kitchen apixaban (ELIQUIS) 2.5 MG TABS tablet Take 1 tablet (2.5 mg total) by mouth 2 (two) times  daily. 90 tablet 3   No current facility-administered medications for this visit.      Past Medical History:  Diagnosis Date  . Arthritis   . Cancer [facility])    breast, left; s/p lumpectomy/chemo/radiation  . Depression   . Diabetes mellitus without complication ([place])   . Hyperlipidemia   . Hypertension   . Paroxysmal atrial fibrillation (HCC)   . Presence of permanent cardiac pacemaker   . Renal disorder     ROS:   All systems reviewed and negative except as noted in the HPI.   Past Surgical History:  Procedure Laterality Date  . BREAST LUMPECTOMY Left   . CARDIAC PACEMAKER PLACEMENT    . CATARACT EXTRACTION    . EYE SURGERY    . kidney stone removal     x3  . LAPAROSCOPIC NEPHRECTOMY Left 07/29/2015   Procedure: LEFT LAPAROSCOPIC SIMPLE NEPHRECTOMY;  Surgeon: Ardis Hughs, MD;  Location: [facility];  Service: Urology;  Laterality: Left;  . LUMBAR SPINE SURGERY     laminectomy and rod     Family History  Problem Relation Age of Onset  . Cancer Mother     pancreatic  . Diabetes Sister   . Hypertension Sister   . Hypertension Brother      Social History   Social History  . Marital status:  Widowed    Spouse name: N/A  . Number of children: 0  . Years of education: N/A   Occupational History  . retired    Social History Main Topics  . Smoking status: Never Smoker  . Smokeless tobacco: Never Used  . Alcohol use No  . Drug use: No  . Sexual activity: Not on file   Other Topics Concern  . Not on file   Social History Narrative  . No narrative on file     BP (!) 158/62   Pulse 60   Ht 5' (1.524 m)   Wt 123 lb 12.8 oz (56.2 kg)   BMI 24.18 kg/m   Physical Exam:  Well appearing 83 yo man, NAD HEENT: Unremarkable Neck:  6 cm JVD, no thyromegally Lymphatics:  No adenopathy Back:  No CVA tenderness Lungs:  Clear with no wheezes HEART:  Regular rate rhythm, no murmurs, no rubs, no clicks Abd:  soft, positive bowel sounds, no organomegally,  no rebound, no guarding Ext:  2 plus pulses, no edema, no cyanosis, no clubbing Skin:  No rashes no nodules Neuro:  CN II through XII intact, motor grossly intact  EKG - nsr with atrial pacing  DEVICE  Normal device function.  See PaceArt for details.   A/P 1. Sinus node dysfunction - she is s/p PPM insertion and now is asymptomatic. 2. S/p PPM - her medtronic device is working normally. Will recheck in several months.  3. Atrial fib - she clearly has atrial fib on her PPM interogation. Will continue eliquis 5 mg twice daily. 4. HTN - Her blood pressure is elevated. She is encouraged to reduce her salt intake.  Mikle Bosworth.D.

## 2016-03-23 ENCOUNTER — Non-Acute Institutional Stay (SKILLED_NURSING_FACILITY): Payer: Medicare Other | Admitting: Internal Medicine

## 2016-03-23 DIAGNOSIS — Z20828 Contact with and (suspected) exposure to other viral communicable diseases: Secondary | ICD-10-CM

## 2016-03-23 DIAGNOSIS — R6889 Other general symptoms and signs: Secondary | ICD-10-CM

## 2016-03-27 ENCOUNTER — Other Ambulatory Visit: Payer: Self-pay | Admitting: Internal Medicine

## 2016-03-27 ENCOUNTER — Encounter: Payer: Self-pay | Admitting: Internal Medicine

## 2016-03-27 DIAGNOSIS — R591 Generalized enlarged lymph nodes: Secondary | ICD-10-CM

## 2016-03-27 DIAGNOSIS — I10 Essential (primary) hypertension: Secondary | ICD-10-CM | POA: Diagnosis not present

## 2016-03-27 LAB — CBC AND DIFFERENTIAL
HCT: 42 % (ref 36–46)
Hemoglobin: 13.9 g/dL (ref 12.0–16.0)
Platelets: 223 10*3/uL (ref 150–399)
WBC: 8.8 10^3/mL

## 2016-03-27 LAB — BASIC METABOLIC PANEL
BUN: 19 mg/dL (ref 4–21)
CREATININE: 0.9 mg/dL (ref 0.5–1.1)
Glucose: 193 mg/dL
Potassium: 4.2 mmol/L (ref 3.4–5.3)
SODIUM: 141 mmol/L (ref 137–147)

## 2016-03-29 ENCOUNTER — Encounter: Payer: Self-pay | Admitting: Internal Medicine

## 2016-03-29 NOTE — Progress Notes (Signed)
Location:  [place] Room Number: [phone] Place of Service:  SNF [phone])  Allison Dunn. Sheppard Coil, MD  Patient Care Team: Binnie Rail, MD as PCP - General (Internal Medicine) Jola Schmidt, MD as Consulting Physician (Ophthalmology) Cameron Sprang, MD as Consulting Physician (Neurology) Evans Lance, MD as Consulting Physician (Cardiology)  Extended Emergency Contact Information Primary Emergency Contact: [name],[name] Address: [address]          [place], [place] [address] [name] of [place] Phone: [phone] Mobile Phone: [phone] Relation: Sister    Allergies: Patient has no known allergies.  Chief Complaint  Patient presents with  . Medical Management of Chronic Issues    Routine Visit    HPI: Patient is 83 y.o. female who   Past Medical History:  Diagnosis Date  . Arthritis   . Cancer [facility])    breast, left; s/p lumpectomy/chemo/radiation  . Depression   . Diabetes mellitus without complication ([place])   . Hyperlipidemia   . Hypertension   . Paroxysmal atrial fibrillation (HCC)   . Presence of permanent cardiac pacemaker   . Renal disorder     Past Surgical History:  Procedure Laterality Date  . BREAST LUMPECTOMY Left   . CARDIAC PACEMAKER PLACEMENT    . CATARACT EXTRACTION    . EYE SURGERY    . kidney stone removal     x3  . LAPAROSCOPIC NEPHRECTOMY Left 07/29/2015   Procedure: LEFT LAPAROSCOPIC SIMPLE NEPHRECTOMY;  Surgeon: Ardis Hughs, MD;  Location: [facility];  Service: Urology;  Laterality: Left;  . LUMBAR SPINE SURGERY     laminectomy and rod    Allergies as of 03/29/2016   No Known Allergies     Medication List       Accurate as of 03/29/16 10:02 AM. Always use your most recent med list.          allopurinol 300 MG tablet Commonly known as:  ZYLOPRIM TAKE ONE TABLET BY MOUTH ONCE DAILY   amiodarone 200 MG tablet Commonly known as:  PACERONE Take 1 tablet (200 mg total) by mouth daily.     amLODipine 5 MG tablet Commonly known as:  NORVASC Take 1 tablet (5 mg total) by mouth daily.   apixaban 2.5 MG Tabs tablet Commonly known as:  ELIQUIS Take 2.5 mg by mouth 2 (two) times daily.   atorvastatin 80 MG tablet Commonly known as:  LIPITOR Take 1 tablet (80 mg total) by mouth daily with supper.   benazepril 10 MG tablet Commonly known as:  LOTENSIN Take 1 tablet (10 mg total) by mouth daily.   D3-1000 PO Take 1 tablet by mouth daily with lunch.   donepezil 10 MG tablet Commonly known as:  ARICEPT Take 1 tablet (10 mg total) by mouth at bedtime.   feeding supplement (GLUCERNA SHAKE) Liqd Take 237 mLs by mouth 2 (two) times daily between meals.   insulin lispro 100 UNIT/ML injection Commonly known as:  HUMALOG Inject 5 units subcutaneously three times a day before meals with blood glucose greater than 200; inject 8 units subcutaneously before meals with CBG greater than 300   TOUJEO SOLOSTAR 300 UNIT/ML Sopn Generic drug:  Insulin Glargine Inject 4 Units into the skin at bedtime.   vitamin B-12 500 MCG tablet Commonly known as:  CYANOCOBALAMIN Take 1,500 mcg by mouth daily with lunch. 3 tablets   WOMENS MULTI VITAMIN & MINERAL PO Take 1 tablet by mouth daily with lunch.  Meds ordered this encounter  Medications  . apixaban (ELIQUIS) 2.5 MG TABS tablet    Sig: Take 2.5 mg by mouth 2 (two) times daily.  . vitamin B-12 (CYANOCOBALAMIN) 500 MCG tablet    Sig: Take 1,500 mcg by mouth daily with lunch. 3 tablets  . Insulin Glargine (TOUJEO SOLOSTAR) 300 UNIT/ML SOPN    Sig: Inject 4 Units into the skin at bedtime.    Immunization History  Administered Date(s) Administered  . Influenza, High Dose Seasonal PF 12/30/2015  . PPD Test 04/26/2015, 08/02/2015    Social History  Substance Use Topics  . Smoking status: Never Smoker  . Smokeless tobacco: Never Used  . Alcohol use No    Review of Systems  DATA OBTAINED: from patient, nurse, medical  record, family member GENERAL:  no fevers, fatigue, appetite changes SKIN: No itching, rash HEENT: No complaint RESPIRATORY: No cough, wheezing, SOB CARDIAC: No chest pain, palpitations, lower extremity edema  GI: No abdominal pain, No N/V/D or constipation, No heartburn or reflux  GU: No dysuria, frequency or urgency, or incontinence  MUSCULOSKELETAL: No unrelieved bone/joint pain NEUROLOGIC: No headache, dizziness  PSYCHIATRIC: No overt anxiety or sadness  Vitals:   03/29/16 0953  BP: (!) 163/69  Pulse: 60  Resp: 20  Temp: 97.7 F (36.5 C)   Body mass index is 23.53 kg/m. Physical Exam  GENERAL APPEARANCE: Alert, conversant, No acute distress  SKIN: No diaphoresis rash HEENT: Unremarkable RESPIRATORY: Breathing is even, unlabored. Lung sounds are clear   CARDIOVASCULAR: Heart RRR no murmurs, rubs or gallops. No peripheral edema  GASTROINTESTINAL: Abdomen is soft, non-tender, not distended w/ normal bowel sounds.  GENITOURINARY: Bladder non tender, not distended  MUSCULOSKELETAL: No abnormal joints or musculature NEUROLOGIC: Cranial nerves 2-12 grossly intact. Moves all extremities PSYCHIATRIC: Mood and affect appropriate to situation, no behavioral issues  Patient Active Problem List   Diagnosis Date Noted  . Degenerative cervical spinal stenosis 03/01/2016  . Spinal cord compression due to degenerative disorder of spinal column ([place]) 03/01/2016  . Hypokalemia 03/01/2016  . Myelopathy ([place])   . Generalized weakness 02/19/2016  . Diaphragmatic injury as surgical complication 34/74/2595  . Postoperative anemia due to acute blood loss 08/14/2015  . Dementia without behavioral disturbance 08/14/2015  . Chronic UTI 07/29/2015  . 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 Klebsiella sepsis ([place]) 05/02/2015  . CAP (community acquired pneumonia) 05/02/2015  . Transaminasemia   . Sepsis ([place]) 04/22/2015  . Paroxysmal atrial fibrillation ([place]) 04/22/2015  . Delirium    . Urinary tract infectious disease   . Osteoarthritis 03/27/2015  . Cerebrovascular accident (CVA) due to thrombosis of cerebral artery ([place]) 03/23/2015  . Moderate tricuspid regurgitation 03/11/2015  . Cardiomyopathy ([place]), EF 40-45%  02/2015 03/11/2015  . Mild mitral regurgitation 03/11/2015  . Pulmonary arterial hypertension ([place]), mild 03/11/2015  . Mild cognitive impairment 02/24/2015  . Type 2 diabetes mellitus without complication, with long-term current use of insulin ([place]) 02/24/2015  . Hypertensive heart disease with CHF (congestive heart failure) ([place]) 02/24/2015  . Diabetes ([place]) 02/21/2015  . Essential hypertension, benign 02/21/2015  . Hyperlipidemia 02/19/2015  . Gout 02/19/2015  . PAD (peripheral artery disease) ([place]) 02/19/2015  . Cardiac pacemaker in situ 02/19/2015  . CKD (chronic kidney disease) 02/19/2015    CMP     Component Value Date/Time   NA 138 02/23/2016 0605   NA 138 02/23/2016   K 4.1 02/23/2016 0605   CL 101 02/23/2016 0605   CO2 33 (H) 02/23/2016 6387  GLUCOSE 139 (H) 02/23/2016 0605   BUN 19 02/23/2016 0605   BUN 19 02/23/2016   CREATININE 1.12 (H) 02/23/2016 0605   CALCIUM 8.8 (L) 02/23/2016 0605   PROT 7.0 06/21/2015 1317   ALBUMIN 3.3 (L) 06/21/2015 1317   AST 39 06/21/2015 1317   ALT 47 06/21/2015 1317   ALKPHOS 110 06/21/2015 1317   BILITOT 0.4 06/21/2015 1317   GFRNONAA 44 (L) 02/23/2016 0605   GFRAA 52 (L) 02/23/2016 0605    Recent Labs  02/19/16 1318 02/19/16 1633 02/21/16 0449 02/23/16 02/23/16 0605  NA 136  --  137 138 138  K 3.2*  --  3.3*  --  4.1  CL 97*  --  98*  --  101  CO2 30  --  32  --  33*  GLUCOSE 183*  --  132*  --  139*  BUN 26*  --  21* 19 19  CREATININE 1.21*  --  1.12* 1.1 1.12*  CALCIUM 9.1  --  9.0  --  8.8*  MG  --  1.8  --   --   --     Recent Labs  04/26/15 0530 05/19/15 1022 06/21/15 1317  AST 102* 36 39  ALT 155* 30 47  ALKPHOS 76 120* 110  BILITOT 0.4 0.5 0.4  PROT 6.0* 7.3 7.0    ALBUMIN 2.2* 3.4* 3.3*    Recent Labs  04/22/15 2245  06/21/15 1317  08/01/15 0926 02/19/16 1318 02/23/16 02/23/16 0605  WBC 7.0  < > 6.0  < > 14.6* 8.6 7.4 7.4  NEUTROABS 4.6  --  3.7  --  13.3*  --   --   --   HGB 11.4*  < > 11.8*  < > 11.3* 14.2 12.3 12.3  HCT 34.2*  < > 36.1  < > 33.2* 41.7 37 36.7  MCV 91.7  < > 94.5  < > 91.2 90.7  --  92.0  PLT 156  < > 221  < > 190 218 215 215  < > = values in this interval not displayed.  Recent Labs  02/20/16 0440  CHOL 222*  LDLCALC 133*  TRIG 74   No results found for: [facility] Lab Results  Component Value Date   TSH 1.092 02/19/2016   Lab Results  Component Value Date   HGBA1C 7.3 (H) 02/20/2016   Lab Results  Component Value Date   CHOL 222 (H) 02/20/2016   HDL 74 02/20/2016   LDLCALC 133 (H) 02/20/2016   TRIG 74 02/20/2016   CHOLHDL 3.0 02/20/2016    Significant Diagnostic Results in last 30 days:  No results found.  Assessment and Plan  No problem-specific Assessment & Plan notes found for this encounter.   Labs/tests ordered:    Allison Dunn. Sheppard Coil, MD

## 2016-03-30 DIAGNOSIS — M4712 Other spondylosis with myelopathy, cervical region: Secondary | ICD-10-CM | POA: Diagnosis not present

## 2016-03-31 ENCOUNTER — Ambulatory Visit (HOSPITAL_COMMUNITY): Payer: Medicare Other

## 2016-03-31 ENCOUNTER — Non-Acute Institutional Stay (SKILLED_NURSING_FACILITY): Payer: Medicare Other | Admitting: Internal Medicine

## 2016-03-31 ENCOUNTER — Encounter: Payer: Self-pay | Admitting: Internal Medicine

## 2016-03-31 DIAGNOSIS — I110 Hypertensive heart disease with heart failure: Secondary | ICD-10-CM

## 2016-03-31 DIAGNOSIS — I255 Ischemic cardiomyopathy: Secondary | ICD-10-CM

## 2016-03-31 DIAGNOSIS — I739 Peripheral vascular disease, unspecified: Secondary | ICD-10-CM

## 2016-03-31 NOTE — Progress Notes (Signed)
Location:  [facility] Room Number: [phone] Place of Service:  SNF [phone])  Hennie Duos, MD  Patient Care Team: Binnie Rail, MD as PCP - General (Internal Medicine) Jola Schmidt, MD as Consulting Physician (Ophthalmology) Cameron Sprang, MD as Consulting Physician (Neurology) Evans Lance, MD as Consulting Physician (Cardiology)  Extended Emergency Contact Information Primary Emergency Contact: [name],[name] Address: [address]          [place], [place] [address] [name] of [place] Phone: [phone] Mobile Phone: [phone] Relation: Sister    Allergies: Patient has no known allergies.  Chief Complaint  Patient presents with  . Medical Management of Chronic Issues    Routine Visit    HPI: Patient is 83 y.o. female who is being seen for routine issues of cardiomyopathy, HTN and PAD.  Past Medical History:  Diagnosis Date  . Arthritis   . Cancer [facility])    breast, left; s/p lumpectomy/chemo/radiation  . Depression   . Diabetes mellitus without complication ([place])   . Hyperlipidemia   . Hypertension   . Paroxysmal atrial fibrillation (HCC)   . Presence of permanent cardiac pacemaker   . Renal disorder     Past Surgical History:  Procedure Laterality Date  . BREAST LUMPECTOMY Left   . CARDIAC PACEMAKER PLACEMENT    . CATARACT EXTRACTION    . EYE SURGERY    . kidney stone removal     x3  . LAPAROSCOPIC NEPHRECTOMY Left 07/29/2015   Procedure: LEFT LAPAROSCOPIC SIMPLE NEPHRECTOMY;  Surgeon: Ardis Hughs, MD;  Location: [facility];  Service: Urology;  Laterality: Left;  . LUMBAR SPINE SURGERY     laminectomy and rod    Allergies as of 03/31/2016   No Known Allergies     Medication List       Accurate as of 03/31/16 11:59 PM. Always use your most recent med list.          allopurinol 300 MG tablet Commonly known as:  ZYLOPRIM TAKE ONE TABLET BY MOUTH ONCE DAILY   amiodarone 200 MG tablet Commonly known  as:  PACERONE Take 1 tablet (200 mg total) by mouth daily.   amLODipine 5 MG tablet Commonly known as:  NORVASC Take 1 tablet (5 mg total) by mouth daily.   apixaban 2.5 MG Tabs tablet Commonly known as:  ELIQUIS Take 2.5 mg by mouth 2 (two) times daily.   atorvastatin 80 MG tablet Commonly known as:  LIPITOR Take 1 tablet (80 mg total) by mouth daily with supper.   benazepril 10 MG tablet Commonly known as:  LOTENSIN Take 1 tablet (10 mg total) by mouth daily.   D3-1000 PO Take 1 tablet by mouth daily with lunch.   donepezil 10 MG tablet Commonly known as:  ARICEPT Take 1 tablet (10 mg total) by mouth at bedtime.   feeding supplement (GLUCERNA SHAKE) Liqd Take 237 mLs by mouth 2 (two) times daily between meals.   insulin lispro 100 UNIT/ML injection Commonly known as:  HUMALOG Inject 5 units subcutaneously three times a day before meals with blood glucose greater than 200; inject 8 units subcutaneously before meals with CBG greater than 300   TOUJEO SOLOSTAR 300 UNIT/ML Sopn Generic drug:  Insulin Glargine Inject 4 Units into the skin at bedtime.   vitamin B-12 500 MCG tablet Commonly known as:  CYANOCOBALAMIN Take 1,500 mcg by mouth daily with lunch. 3 tablets   WOMENS MULTI VITAMIN & MINERAL PO  Take 1 tablet by mouth daily with lunch.       No orders of the defined types were placed in this encounter.   Immunization History  Administered Date(s) Administered  . Influenza, High Dose Seasonal PF 12/30/2015  . PPD Test 04/26/2015, 08/02/2015, 02/23/2016    Social History  Substance Use Topics  . Smoking status: Never Smoker  . Smokeless tobacco: Never Used  . Alcohol use No    Review of Systems  DATA OBTAINED: from patient, nurse GENERAL:  no fevers, fatigue, appetite changes SKIN: No itching, rash HEENT: No complaint RESPIRATORY: No cough, wheezing, SOB CARDIAC: No chest pain, palpitations, lower extremity edema  GI: No abdominal pain, No N/V/D  or constipation, No heartburn or reflux  GU: No dysuria, frequency or urgency, or incontinence  MUSCULOSKELETAL: No unrelieved bone/joint pain NEUROLOGIC: No headache, dizziness  PSYCHIATRIC: No overt anxiety or sadness  Vitals:   03/31/16 0836  BP: (!) 163/69  Pulse: 60  Resp: 20  Temp: 97.7 F (36.5 C)   Body mass index is 23.53 kg/m. Physical Exam  GENERAL APPEARANCE: Alert, conversant, No acute distress  SKIN: No diaphoresis rash HEENT: Unremarkable RESPIRATORY: Breathing is even, unlabored. Lung sounds are clear   CARDIOVASCULAR: Heart RRR no murmurs, rubs or gallops. No peripheral edema  GASTROINTESTINAL: Abdomen is soft, non-tender, not distended w/ normal bowel sounds.  GENITOURINARY: Bladder non tender, not distended  MUSCULOSKELETAL: No abnormal joints or musculature NEUROLOGIC: Cranial nerves 2-12 grossly intact. Moves all extremities with mild RUE weakness PSYCHIATRIC: Mood and affect appropriate to situation with dementia, no behavioral issues  Patient Active Problem List   Diagnosis Date Noted  . Degenerative cervical spinal stenosis 03/01/2016  . Spinal cord compression due to degenerative disorder of spinal column ([place]) 03/01/2016  . Hypokalemia 03/01/2016  . Myelopathy ([place])   . Generalized weakness 02/19/2016  . Diaphragmatic injury as surgical complication 70/48/8891  . Postoperative anemia due to acute blood loss 08/14/2015  . Dementia without behavioral disturbance 08/14/2015  . Chronic UTI 07/29/2015  . 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 Klebsiella sepsis ([place]) 05/02/2015  . CAP (community acquired pneumonia) 05/02/2015  . Transaminasemia   . Sepsis ([place]) 04/22/2015  . Paroxysmal atrial fibrillation ([place]) 04/22/2015  . Delirium   . Urinary tract infectious disease   . Osteoarthritis 03/27/2015  . Cerebrovascular accident (CVA) due to thrombosis of cerebral artery ([place]) 03/23/2015  . Moderate tricuspid regurgitation 03/11/2015  .  Cardiomyopathy ([place]), EF 40-45%  02/2015 03/11/2015  . Mild mitral regurgitation 03/11/2015  . Pulmonary arterial hypertension ([place]), mild 03/11/2015  . Mild cognitive impairment 02/24/2015  . Type 2 diabetes mellitus without complication, with long-term current use of insulin ([place]) 02/24/2015  . Hypertensive heart disease with CHF (congestive heart failure) ([place]) 02/24/2015  . Diabetes ([place]) 02/21/2015  . Essential hypertension, benign 02/21/2015  . Hyperlipidemia 02/19/2015  . Gout 02/19/2015  . PAD (peripheral artery disease) ([place]) 02/19/2015  . Cardiac pacemaker in situ 02/19/2015  . CKD (chronic kidney disease) 02/19/2015    CMP     Component Value Date/Time   NA 141 04/03/2016   K 4.7 04/03/2016   CL 101 02/23/2016 0605   CO2 33 (H) 02/23/2016 0605   GLUCOSE 139 (H) 02/23/2016 0605   BUN 24 (A) 04/03/2016   CREATININE 1.1 04/03/2016   CREATININE 1.12 (H) 02/23/2016 0605   CALCIUM 8.8 (L) 02/23/2016 0605   PROT 7.0 06/21/2015 1317   ALBUMIN 3.3 (L) 06/21/2015 1317   AST 39 06/21/2015 1317  ALT 47 06/21/2015 1317   ALKPHOS 110 06/21/2015 1317   BILITOT 0.4 06/21/2015 1317   GFRNONAA 44 (L) 02/23/2016 0605   GFRAA 52 (L) 02/23/2016 0605    Recent Labs  02/19/16 1318 02/19/16 1633 02/21/16 0449  02/23/16 0605 03/27/16 04/03/16  NA 136  --  137  < > 138 141 141  K 3.2*  --  3.3*  --  4.1 4.2 4.7  CL 97*  --  98*  --  101  --   --   CO2 30  --  32  --  33*  --   --   GLUCOSE 183*  --  132*  --  139*  --   --   BUN 26*  --  21*  < > 19 19 24*  CREATININE 1.21*  --  1.12*  < > 1.12* 0.9 1.1  CALCIUM 9.1  --  9.0  --  8.8*  --   --   MG  --  1.8  --   --   --   --   --   < > = values in this interval not displayed.  Recent Labs  04/26/15 0530 05/19/15 1022 06/21/15 1317  AST 102* 36 39  ALT 155* 30 47  ALKPHOS 76 120* 110  BILITOT 0.4 0.5 0.4  PROT 6.0* 7.3 7.0  ALBUMIN 2.2* 3.4* 3.3*    Recent Labs  04/22/15 2245  06/21/15 1317  08/01/15 0926  02/19/16 1318  02/23/16 0605 03/27/16 04/03/16  WBC 7.0  < > 6.0  < > 14.6* 8.6  < > 7.4 8.8 8.8  NEUTROABS 4.6  --  3.7  --  13.3*  --   --   --   --   --   HGB 11.4*  < > 11.8*  < > 11.3* 14.2  < > 12.3 13.9 12.8  HCT 34.2*  < > 36.1  < > 33.2* 41.7  < > 36.7 42 41  MCV 91.7  < > 94.5  < > 91.2 90.7  --  92.0  --   --   PLT 156  < > 221  < > 190 218  < > 215 223 228  < > = values in this interval not displayed.  Recent Labs  02/20/16 0440 04/03/16  CHOL 222* 234*  LDLCALC 133* 135  TRIG 74 96   No results found for: [facility] Lab Results  Component Value Date   TSH 1.16 04/03/2016   Lab Results  Component Value Date   HGBA1C 8.4 04/03/2016   Lab Results  Component Value Date   CHOL 234 (A) 04/03/2016   HDL 80 (A) 04/03/2016   LDLCALC 135 04/03/2016   TRIG 96 04/03/2016   CHOLHDL 3.0 02/20/2016    Significant Diagnostic Results in last 30 days:  Ct Chest W Contrast  Result Date: 04/04/2016 CLINICAL DATA:  Concern for mass on chest radiograph. Further evaluation requested. Initial encounter. EXAM: CT CHEST WITH CONTRAST TECHNIQUE: Multidetector CT imaging of the chest was performed during intravenous contrast administration. CONTRAST:  55m ISOVUE-300 IOPAMIDOL (ISOVUE-300) INJECTION 61% COMPARISON:  Chest radiograph performed 02/19/2016 FINDINGS: Cardiovascular: The heart is mildly enlarged. Diffuse coronary artery calcifications are seen. Scattered calcification is noted along the aortic arch and descending thoracic aorta. The great vessels demonstrate mild calcification but are otherwise unremarkable. Mediastinum/Nodes: There is a lobulated 3.8 x 3.3 x 3.6 cm mass at the anterior right hilum, extending into the right middle lung lobe, with  adjacent opacity possibly reflecting postobstructive pneumonia. No definite mediastinal lymphadenopathy is seen. No pericardial effusion is identified. Scattered small hypodensities within the thyroid gland measure up to 1.3 cm, likely  benign given their size. No axillary lymphadenopathy is appreciated. A pacemaker is noted at the right chest wall, with leads ending at the right atrium and right ventricle. Lungs/Pleura: Trace fluid is suggested tracking along the superior aspect of the left major fissure. Mild bibasilar atelectasis is noted. No additional pulmonary nodules are identified. No pneumothorax is seen. Upper Abdomen: The visualized portions of the liver and spleen are grossly unremarkable. The visualized portions of the pancreas and adrenal glands are within normal limits. Musculoskeletal: No acute osseous abnormalities are identified. The visualized musculature is unremarkable in appearance. IMPRESSION: 1. Lobulated 3.8 cm mass at the anterior right hilum, extending into the right middle lung lobe, with adjacent airspace opacity possibly reflecting postobstructive pneumonia. This is concerning for primary bronchogenic malignancy. Tissue diagnosis is recommended, as deemed clinically appropriate. 2. Trace fluid suggested tracking along the superior aspect of the left major fissure. Mild bibasilar atelectasis noted. 3. Mild cardiomegaly.  Diffuse coronary artery calcifications seen. Electronically Signed   By: Garald Balding M.D.   On: 04/04/2016 21:18    Assessment and Plan  Cardiomyopathy [facility]), EF 40-45%  02/2015 Chronic and stable; pt is not on diuretic, is on an ACE; will cont monitor  Hypertensive heart disease with CHF (congestive heart failure) ([place]) Not well controlled; will inc lotensin to 20 mg daily  PAD (peripheral artery disease) ([place]) No reported skin breakdowns;s/p stent I L leg; pt on eliquis for AF to cont; will monitor skin     Chivas Notz D. Sheppard Coil, MD

## 2016-04-03 DIAGNOSIS — N189 Chronic kidney disease, unspecified: Secondary | ICD-10-CM | POA: Diagnosis not present

## 2016-04-03 DIAGNOSIS — E559 Vitamin D deficiency, unspecified: Secondary | ICD-10-CM | POA: Diagnosis not present

## 2016-04-03 DIAGNOSIS — E119 Type 2 diabetes mellitus without complications: Secondary | ICD-10-CM | POA: Diagnosis not present

## 2016-04-03 DIAGNOSIS — I129 Hypertensive chronic kidney disease with stage 1 through stage 4 chronic kidney disease, or unspecified chronic kidney disease: Secondary | ICD-10-CM | POA: Diagnosis not present

## 2016-04-03 DIAGNOSIS — E785 Hyperlipidemia, unspecified: Secondary | ICD-10-CM | POA: Diagnosis not present

## 2016-04-03 LAB — LIPID PANEL
Cholesterol: 234 mg/dL — AB (ref 0–200)
HDL: 80 mg/dL — AB (ref 35–70)
LDL Cholesterol: 135 mg/dL
Triglycerides: 96 mg/dL (ref 40–160)

## 2016-04-03 LAB — VITAMIN B12

## 2016-04-03 LAB — HEMOGLOBIN A1C: Hemoglobin A1C: 8.4

## 2016-04-03 LAB — BASIC METABOLIC PANEL
BUN: 24 mg/dL — AB (ref 4–21)
CREATININE: 1.1 mg/dL (ref 0.5–1.1)
GLUCOSE: 135 mg/dL
POTASSIUM: 4.7 mmol/L (ref 3.4–5.3)
Sodium: 141 mmol/L (ref 137–147)

## 2016-04-03 LAB — VITAMIN D 25 HYDROXY (VIT D DEFICIENCY, FRACTURES): Vit D, 25-Hydroxy: >60

## 2016-04-03 LAB — TSH: TSH: 1.16 u[IU]/mL (ref 0.41–5.90)

## 2016-04-03 LAB — CBC AND DIFFERENTIAL
HEMATOCRIT: 41 % (ref 36–46)
HEMOGLOBIN: 12.8 g/dL (ref 12.0–16.0)
PLATELETS: 228 10*3/uL (ref 150–399)
WBC: 8.8 10^3/mL

## 2016-04-04 ENCOUNTER — Ambulatory Visit (HOSPITAL_COMMUNITY)
Admission: RE | Admit: 2016-04-04 | Discharge: 2016-04-04 | Disposition: A | Discharge status: Home / Self Care | Payer: Medicare Other | Source: Ambulatory Visit | Attending: Internal Medicine | Admitting: Internal Medicine

## 2016-04-04 ENCOUNTER — Ambulatory Visit (HOSPITAL_COMMUNITY): Payer: Medicare Other

## 2016-04-04 DIAGNOSIS — R918 Other nonspecific abnormal finding of lung field: Secondary | ICD-10-CM | POA: Insufficient documentation

## 2016-04-04 DIAGNOSIS — J9811 Atelectasis: Secondary | ICD-10-CM | POA: Insufficient documentation

## 2016-04-04 DIAGNOSIS — I517 Cardiomegaly: Secondary | ICD-10-CM | POA: Insufficient documentation

## 2016-04-04 DIAGNOSIS — R591 Generalized enlarged lymph nodes: Secondary | ICD-10-CM | POA: Diagnosis not present

## 2016-04-04 DIAGNOSIS — R222 Localized swelling, mass and lump, trunk: Secondary | ICD-10-CM | POA: Diagnosis not present

## 2016-04-04 DIAGNOSIS — I2510 Atherosclerotic heart disease of native coronary artery without angina pectoris: Secondary | ICD-10-CM | POA: Diagnosis not present

## 2016-04-04 MED ORDER — IOPAMIDOL (ISOVUE-300) INJECTION 61%
75.0000 mL | Freq: Once | INTRAVENOUS | Status: AC | PRN
  Administered 2016-04-04: 75 mL via INTRAVENOUS

## 2016-04-04 MED ORDER — SODIUM CHLORIDE 0.9 % IJ SOLN
INTRAMUSCULAR | Status: AC
  Filled 2016-04-04: qty 50

## 2016-04-04 MED ORDER — IOPAMIDOL (ISOVUE-300) INJECTION 61%
INTRAVENOUS | Status: AC
  Filled 2016-04-04: qty 75

## 2016-04-07 ENCOUNTER — Non-Acute Institutional Stay (SKILLED_NURSING_FACILITY): Payer: Medicare Other | Admitting: Internal Medicine

## 2016-04-07 ENCOUNTER — Encounter: Payer: Self-pay | Admitting: Internal Medicine

## 2016-04-07 DIAGNOSIS — R918 Other nonspecific abnormal finding of lung field: Secondary | ICD-10-CM

## 2016-04-07 NOTE — Progress Notes (Signed)
Location:  [facility] Room Number: [phone] Place of Service:  SNF [phone])  Allison Dunn. Sheppard Coil, MD  Patient Care Team: Binnie Rail, MD as PCP - General (Internal Medicine) Jola Schmidt, MD as Consulting Physician (Ophthalmology) Cameron Sprang, MD as Consulting Physician (Neurology) Evans Lance, MD as Consulting Physician (Cardiology)  Extended Emergency Contact Information Primary Emergency Contact: [name],[name] Address: [address]          [place], [place] [address] [name] of [place] Phone: [phone] Mobile Phone: [phone] Relation: Sister    Allergies: Patient has no known allergies.  Chief Complaint  Patient presents with  . Acute Visit    Acute     HPI: Patient is 83 y.o. female who had abnormal lymphadenopathy on a CXR form the hospital and who has had her f/u CT chest. Sister and RP is here today to discuss results and plan for moving forward, if desired.  Past Medical History:  Diagnosis Date  . Arthritis   . Cancer [facility])    breast, left; s/p lumpectomy/chemo/radiation  . Depression   . Diabetes mellitus without complication ([place])   . Hyperlipidemia   . Hypertension   . Paroxysmal atrial fibrillation (HCC)   . Presence of permanent cardiac pacemaker   . Renal disorder     Past Surgical History:  Procedure Laterality Date  . BREAST LUMPECTOMY Left   . CARDIAC PACEMAKER PLACEMENT    . CATARACT EXTRACTION    . EYE SURGERY    . kidney stone removal     x3  . LAPAROSCOPIC NEPHRECTOMY Left 07/29/2015   Procedure: LEFT LAPAROSCOPIC SIMPLE NEPHRECTOMY;  Surgeon: Ardis Hughs, MD;  [facility]: [facility];  Service: Urology;  Laterality: Left;  . LUMBAR SPINE SURGERY     laminectomy and rod    Allergies as of 04/07/2016   No Known Allergies     Medication List       Accurate as of 04/07/16  4:00 PM. Always use your most recent med list.          allopurinol 300 MG tablet Commonly known as:   ZYLOPRIM TAKE ONE TABLET BY MOUTH ONCE DAILY   amiodarone 200 MG tablet Commonly known as:  PACERONE Take 1 tablet (200 mg total) by mouth daily.   amLODipine 5 MG tablet Commonly known as:  NORVASC Take 1 tablet (5 mg total) by mouth daily.   apixaban 2.5 MG Tabs tablet Commonly known as:  ELIQUIS Take 2.5 mg by mouth 2 (two) times daily.   atorvastatin 80 MG tablet Commonly known as:  LIPITOR Take 1 tablet (80 mg total) by mouth daily with supper.   benazepril 10 MG tablet Commonly known as:  LOTENSIN Take 1 tablet (10 mg total) by mouth daily.   donepezil 10 MG tablet Commonly known as:  ARICEPT Take 1 tablet (10 mg total) by mouth at bedtime.   feeding supplement (GLUCERNA SHAKE) Liqd Take 237 mLs by mouth 2 (two) times daily between meals.   insulin lispro 100 UNIT/ML injection Commonly known as:  HUMALOG Inject 5 units subcutaneously three times a day before meals with blood glucose greater than 200; inject 8 units subcutaneously before meals with CBG greater than 300   TOUJEO SOLOSTAR 300 UNIT/ML Sopn Generic drug:  Insulin Glargine Inject 4 Units into the skin at bedtime.   WOMENS MULTI VITAMIN & MINERAL PO Take 1 tablet by mouth daily with lunch.  No orders of the defined types were placed in this encounter.   Immunization History  Administered Date(s) Administered  . Influenza, High Dose Seasonal PF 12/30/2015  . PPD Test 04/26/2015, 08/02/2015, 02/23/2016    Social History  Substance Use Topics  . Smoking status: Never Smoker  . Smokeless tobacco: Never Used  . Alcohol use No    Review of Systems  DATA OBTAINED: from patient, sister GENERAL:  no fevers, fatigue, appetite changes SKIN: No itching, rash HEENT: No complaint RESPIRATORY: No cough, wheezing, SOB CARDIAC: No chest pain, palpitations, lower extremity edema  GI: No abdominal pain, No N/V/D or constipation, No heartburn or reflux  GU: No dysuria, frequency or urgency, or  incontinence  MUSCULOSKELETAL: No unrelieved bone/joint pain NEUROLOGIC: No headache, dizziness  PSYCHIATRIC: No overt anxiety or sadness  Vitals:   04/07/16 1558  BP: (!) 163/69  Pulse: (!) 59  Resp: 18  Temp: 97.7 F (36.5 C)   Body mass index is 22.81 kg/m. Physical Exam  GENERAL APPEARANCE: Alert, conversant, No acute distress  SKIN: No diaphoresis rash HEENT: Unremarkable RESPIRATORY: Breathing is even, unlabored. Lung sounds are clear   CARDIOVASCULAR: Heart RRR no murmurs, rubs or gallops. No peripheral edema  GASTROINTESTINAL: Abdomen is soft, non-tender, not distended w/ normal bowel sounds.  GENITOURINARY: Bladder non tender, not distended  MUSCULOSKELETAL: No abnormal joints or musculature NEUROLOGIC: Cranial nerves 2-12 grossly intact. Moves all extremities PSYCHIATRIC: Mood and affect appropriate to situation with dementia, no behavioral issues  Patient Active Problem List   Diagnosis Date Noted  . Degenerative cervical spinal stenosis 03/01/2016  . Spinal cord compression due to degenerative disorder of spinal column ([place]) 03/01/2016  . Hypokalemia 03/01/2016  . Myelopathy ([place])   . Generalized weakness 02/19/2016  . Diaphragmatic injury as surgical complication 88/50/2774  . Postoperative anemia due to acute blood loss 08/14/2015  . Dementia without behavioral disturbance 08/14/2015  . Chronic UTI 07/29/2015  . 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 Klebsiella sepsis ([place]) 05/02/2015  . CAP (community acquired pneumonia) 05/02/2015  . Transaminasemia   . Sepsis ([place]) 04/22/2015  . Paroxysmal atrial fibrillation ([place]) 04/22/2015  . Delirium   . Urinary tract infectious disease   . Osteoarthritis 03/27/2015  . Cerebrovascular accident (CVA) due to thrombosis of cerebral artery ([place]) 03/23/2015  . Moderate tricuspid regurgitation 03/11/2015  . Cardiomyopathy ([place]), EF 40-45%  02/2015 03/11/2015  . Mild mitral regurgitation 03/11/2015  .  Pulmonary arterial hypertension ([place]), mild 03/11/2015  . Mild cognitive impairment 02/24/2015  . Type 2 diabetes mellitus without complication, with long-term current use of insulin ([place]) 02/24/2015  . Hypertensive heart disease with CHF (congestive heart failure) ([place]) 02/24/2015  . Diabetes ([place]) 02/21/2015  . Essential hypertension, benign 02/21/2015  . Hyperlipidemia 02/19/2015  . Gout 02/19/2015  . PAD (peripheral artery disease) ([place]) 02/19/2015  . Cardiac pacemaker in situ 02/19/2015  . CKD (chronic kidney disease) 02/19/2015    CMP     Component Value Date/Time   NA 141 04/03/2016   K 4.7 04/03/2016   CL 101 02/23/2016 0605   CO2 33 (H) 02/23/2016 0605   GLUCOSE 139 (H) 02/23/2016 0605   BUN 24 (A) 04/03/2016   CREATININE 1.1 04/03/2016   CREATININE 1.12 (H) 02/23/2016 0605   CALCIUM 8.8 (L) 02/23/2016 0605   PROT 7.0 06/21/2015 1317   ALBUMIN 3.3 (L) 06/21/2015 1317   AST 39 06/21/2015 1317   ALT 47 06/21/2015 1317   ALKPHOS 110 06/21/2015 1317   BILITOT 0.4 06/21/2015  1317   GFRNONAA 44 (L) 02/23/2016 0605   GFRAA 52 (L) 02/23/2016 0605    Recent Labs  02/19/16 1318 02/19/16 1633 02/21/16 0449  02/23/16 0605 03/27/16 04/03/16  NA 136  --  137  < > 138 141 141  K 3.2*  --  3.3*  --  4.1 4.2 4.7  CL 97*  --  98*  --  101  --   --   CO2 30  --  32  --  33*  --   --   GLUCOSE 183*  --  132*  --  139*  --   --   BUN 26*  --  21*  < > 19 19 24*  CREATININE 1.21*  --  1.12*  < > 1.12* 0.9 1.1  CALCIUM 9.1  --  9.0  --  8.8*  --   --   MG  --  1.8  --   --   --   --   --   < > = values in this interval not displayed.  Recent Labs  04/26/15 0530 05/19/15 1022 06/21/15 1317  AST 102* 36 39  ALT 155* 30 47  ALKPHOS 76 120* 110  BILITOT 0.4 0.5 0.4  PROT 6.0* 7.3 7.0  ALBUMIN 2.2* 3.4* 3.3*    Recent Labs  04/22/15 2245  06/21/15 1317  08/01/15 0926 02/19/16 1318  02/23/16 0605 03/27/16 04/03/16  WBC 7.0  < > 6.0  < > 14.6* 8.6  < > 7.4 8.8 8.8    NEUTROABS 4.6  --  3.7  --  13.3*  --   --   --   --   --   HGB 11.4*  < > 11.8*  < > 11.3* 14.2  < > 12.3 13.9 12.8  HCT 34.2*  < > 36.1  < > 33.2* 41.7  < > 36.7 42 41  MCV 91.7  < > 94.5  < > 91.2 90.7  --  92.0  --   --   PLT 156  < > 221  < > 190 218  < > 215 223 228  < > = values in this interval not displayed.  Recent Labs  02/20/16 0440 04/03/16  CHOL 222* 234*  LDLCALC 133* 135  TRIG 74 96   No results found for: [facility] Lab Results  Component Value Date   TSH 1.16 04/03/2016   Lab Results  Component Value Date   HGBA1C 8.4 04/03/2016   Lab Results  Component Value Date   CHOL 234 (A) 04/03/2016   HDL 80 (A) 04/03/2016   LDLCALC 135 04/03/2016   TRIG 96 04/03/2016   CHOLHDL 3.0 02/20/2016    Significant Diagnostic Results in last 30 days:  Ct Chest W Contrast  Result Date: 04/04/2016 CLINICAL DATA:  Concern for mass on chest radiograph. Further evaluation requested. Initial encounter. EXAM: CT CHEST WITH CONTRAST TECHNIQUE: Multidetector CT imaging of the chest was performed during intravenous contrast administration. CONTRAST:  70m ISOVUE-300 IOPAMIDOL (ISOVUE-300) INJECTION 61% COMPARISON:  Chest radiograph performed 02/19/2016 FINDINGS: Cardiovascular: The heart is mildly enlarged. Diffuse coronary artery calcifications are seen. Scattered calcification is noted along the aortic arch and descending thoracic aorta. The great vessels demonstrate mild calcification but are otherwise unremarkable. Mediastinum/Nodes: There is a lobulated 3.8 x 3.3 x 3.6 cm mass at the anterior right hilum, extending into the right middle lung lobe, with adjacent opacity possibly reflecting postobstructive pneumonia. No definite mediastinal lymphadenopathy is seen. No pericardial  effusion is identified. Scattered small hypodensities within the thyroid gland measure up to 1.3 cm, likely benign given their size. No axillary lymphadenopathy is appreciated. A pacemaker is noted at the right  chest wall, with leads ending at the right atrium and right ventricle. Lungs/Pleura: Trace fluid is suggested tracking along the superior aspect of the left major fissure. Mild bibasilar atelectasis is noted. No additional pulmonary nodules are identified. No pneumothorax is seen. Upper Abdomen: The visualized portions of the liver and spleen are grossly unremarkable. The visualized portions of the pancreas and adrenal glands are within normal limits. Musculoskeletal: No acute osseous abnormalities are identified. The visualized musculature is unremarkable in appearance. IMPRESSION: 1. Lobulated 3.8 cm mass at the anterior right hilum, extending into the right middle lung lobe, with adjacent airspace opacity possibly reflecting postobstructive pneumonia. This is concerning for primary bronchogenic malignancy. Tissue diagnosis is recommended, as deemed clinically appropriate. 2. Trace fluid suggested tracking along the superior aspect of the left major fissure. Mild bibasilar atelectasis noted. 3. Mild cardiomegaly.  Diffuse coronary artery calcifications seen. Electronically Signed   By: Garald Balding M.D.   On: 04/04/2016 21:18    Assessment and Plan  LUNG MASS R LUNG - 3.8 BY 3.3 BY 3.6 CM mass RUL extending into RML.  This would most certainly be considered malignant until proven otherwise. The next step will be to get a tissue sample. I encourage RP to at least let us make appt with Pulmonology to find out what that involved and if pt is a candidate or if this is desired once the details of procedure, and looking forward, possible treatments are desired. RP is agreeable to this approach.   Time spent > 25 min Alydia Gosser D. Sheppard Coil, MD

## 2016-04-08 ENCOUNTER — Encounter: Payer: Self-pay | Admitting: Internal Medicine

## 2016-04-09 ENCOUNTER — Encounter: Payer: Self-pay | Admitting: Internal Medicine

## 2016-04-09 NOTE — Assessment & Plan Note (Signed)
Chronic and stable; pt is not on diuretic, is on an ACE; will cont monitor

## 2016-04-09 NOTE — Assessment & Plan Note (Signed)
No reported skin breakdowns;s/p stent I L leg; pt on eliquis for AF to cont; will monitor skin

## 2016-04-09 NOTE — Assessment & Plan Note (Signed)
Not well controlled; will inc lotensin to 20 mg daily

## 2016-04-09 NOTE — Progress Notes (Signed)
This encounter was created in error - please disregard.

## 2016-04-10 ENCOUNTER — Telehealth: Payer: Self-pay

## 2016-04-10 NOTE — Telephone Encounter (Signed)
Sent clearance documents to medical records to be faxed to [facility] [phone]. Dr. Lovena Le stated " 1) May hold Eliquis 3 days before surgery. 2) Restart Eliquis when safe from surgical perspective. 3) Low surgical risk".

## 2016-04-12 ENCOUNTER — Encounter: Payer: Self-pay | Admitting: Neurology

## 2016-04-12 ENCOUNTER — Ambulatory Visit (INDEPENDENT_AMBULATORY_CARE_PROVIDER_SITE_OTHER): Payer: Medicare Other | Admitting: Neurology

## 2016-04-12 VITALS — BP 146/78 | HR 60 | Ht 60.0 in | Wt 119.4 lb

## 2016-04-12 DIAGNOSIS — I6330 Cerebral infarction due to thrombosis of unspecified cerebral artery: Secondary | ICD-10-CM | POA: Diagnosis not present

## 2016-04-12 DIAGNOSIS — F0390 Unspecified dementia without behavioral disturbance: Secondary | ICD-10-CM | POA: Diagnosis not present

## 2016-04-12 DIAGNOSIS — F03A0 Unspecified dementia, mild, without behavioral disturbance, psychotic disturbance, mood disturbance, and anxiety: Secondary | ICD-10-CM

## 2016-04-12 NOTE — Patient Instructions (Signed)
1. Continue Aricept '10mg'$  daily 2. Continue all your other medications 3. Follow-up in 1 year, call for any changes

## 2016-04-12 NOTE — Progress Notes (Signed)
NEUROLOGY FOLLOW UP OFFICE NOTE  Allison Dunn [id number]  HISTORY OF PRESENT ILLNESS: I had the pleasure of seeing Allison Dunn in follow-up in the neurology clinic on 04/14/2016 for mild dementia.The patient was last seen 6 months ago for memory loss. She is again accompanied by her sister who helps supplement the history today. MMSE in August 2017 was 23/30 (24/30 in January 2017. She is taking Aricept '10mg'$  without side effects. Records were reviewed. Since her last visit, she was admitted to [facility] on [facility]/19/[facility] for weakness and fall. She was evaluated by the Stroke team, MRI brain did not show any acute changes but was concerning for cervical spinal cord compression. MRI C-spine showed severe central canal stenosis at C2-3, C3-4, and particularly at C4-5. There was foraminal stenosis at multiple levels.  Her strength was noted to be 4+/5 in all extremities except RUE proximal. Case was discussed with Dr. Cyndy Freeze from Neurosurgery, no acute intervention needed, recommend PT/OT and office follow-up in 2 weeks.   She has been in a SNF since hospital discharge but her sister is planning to bring her home. Her sister feels she is improving, she is talking better, she knows where she is and sometimes remembers events from the day prior. She denies any headaches, dizziness, vision changes, focal numbness/tingling, no bowel/bladder dysfunction. She needs help with dressing and bathing. She is sitting in a wheelchair today.   HPI: This is a pleasant 83 yo RH woman with a history of hypertension, hyperlipidemia, diabetes, pacemaker placement, presenting for evaluation of memory loss. She reports that there are things she can and cannot remember. She forgets conversations but states that she can recall names and dates. She had been living by herself in [place] until November 2016 when her sister called her Thanksgiving morning and reported that she was not too coherent, answering yes and no to questions.  Later that day, her brother-in-law called her to say they were having problems. They brought her back to her apartment and found it to be a mess, she did not know anything, and apparently had not paid the rent or bills for 2 months. She reports that memory changes started in October and she does not recall much of November. Her sister states that she came to pick her up and brought her to live with her in [place] last month. Her sister states she did not seek medical attention at that time, but that they had called her doctor to straighten out her medications. Her sister is now in charge of bills and her medications. She stopped driving after November. Her sister feels she is doing better, she sometimes misplaces things, otherwise no significant concerns. No personality changes. Her sister asks about stress causing the symptoms, her lease was ending in November and she was under stress packing up to move in with her sister. There is no family history of dementia. She denies any history of head injuries, no alcohol use.   PAST MEDICAL HISTORY: Past Medical History:  Diagnosis Date  . Arthritis   . Cancer [facility])    breast, left; s/p lumpectomy/chemo/radiation  . Depression   . Diabetes mellitus without complication ([place])   . Hyperlipidemia   . Hypertension   . Paroxysmal atrial fibrillation (HCC)   . Presence of permanent cardiac pacemaker   . Renal disorder     MEDICATIONS: Current Outpatient Prescriptions on File Prior to Visit  Medication Sig Dispense Refill  . allopurinol (ZYLOPRIM) 300 MG tablet TAKE ONE TABLET  BY MOUTH ONCE DAILY 90 tablet 0  . amiodarone (PACERONE) 200 MG tablet Take 1 tablet (200 mg total) by mouth daily. 90 tablet 1  . amLODipine (NORVASC) 5 MG tablet Take 1 tablet (5 mg total) by mouth daily. 90 tablet 3  . apixaban (ELIQUIS) 2.5 MG TABS tablet Take 2.5 mg by mouth 2 (two) times daily.    Marland Kitchen atorvastatin (LIPITOR) 80 MG tablet Take 1 tablet (80 mg total) by mouth  daily with supper. 90 tablet 1  . benazepril (LOTENSIN) 10 MG tablet Take 1 tablet (10 mg total) by mouth daily. 90 tablet 3  . donepezil (ARICEPT) 10 MG tablet Take 1 tablet (10 mg total) by mouth at bedtime. 90 tablet 3  . feeding supplement, GLUCERNA SHAKE, (GLUCERNA SHAKE) LIQD Take 237 mLs by mouth 2 (two) times daily between meals.    . Insulin Glargine (TOUJEO SOLOSTAR) 300 UNIT/ML SOPN Inject 4 Units into the skin at bedtime.    . insulin lispro (HUMALOG) 100 UNIT/ML injection Inject 5 units subcutaneously three times a day before meals with blood glucose greater than 200; inject 8 units subcutaneously before meals with CBG greater than 300    . Multiple Vitamins-Minerals (WOMENS MULTI VITAMIN & MINERAL PO) Take 1 tablet by mouth daily with lunch.      No current facility-administered medications on file prior to visit.     ALLERGIES: No Known Allergies  FAMILY HISTORY: Family History  Problem Relation Age of Onset  . Cancer Mother     pancreatic  . Diabetes Sister   . Hypertension Sister   . Hypertension Brother     SOCIAL HISTORY: Social History   Social History  . Marital status: Widowed    Spouse name: N/A  . Number of children: 0  . Years of education: N/A   Occupational History  . retired    Social History Main Topics  . Smoking status: Never Smoker  . Smokeless tobacco: Never Used  . Alcohol use No  . Drug use: No  . Sexual activity: Not on file   Other Topics Concern  . Not on file   Social History Narrative  . No narrative on file    REVIEW OF SYSTEMS: Constitutional: No fevers, chills, or sweats, no generalized fatigue, change in appetite Eyes: No visual changes, double vision, eye pain Ear, nose and throat: No hearing loss, ear pain, nasal congestion, sore throat Cardiovascular: No chest pain, palpitations Respiratory:  No shortness of breath at rest or with exertion, wheezes GastrointestinaI: No nausea, vomiting, diarrhea, abdominal pain,  fecal incontinence Genitourinary:  No dysuria, urinary retention or frequency Musculoskeletal:  No neck pain, back pain Integumentary: No rash, pruritus, skin lesions Neurological: as above Psychiatric: No depression, insomnia, anxiety Endocrine: No palpitations, fatigue, diaphoresis, mood swings, change in appetite, change in weight, increased thirst Hematologic/Lymphatic:  No anemia, purpura, petechiae. Allergic/Immunologic: no itchy/runny eyes, nasal congestion, recent allergic reactions, rashes  PHYSICAL EXAM: Vitals:   04/12/16 1100  BP: (!) 146/78  Pulse: 60   General: No acute distress Head:  Normocephalic/atraumatic Neck: supple, no paraspinal tenderness, full range of motion Heart:  Regular rate and rhythm Lungs:  Clear to auscultation bilaterally Back: No paraspinal tenderness Skin/Extremities: No rash, no edema Neurological Exam: alert and oriented to person, place, year, day of week. She states month is "not January." No aphasia or dysarthria. Fund of knowledge is appropriate.  Remote memory intact. 0/3 delayed recall.  Attention and concentration are normal.    Able  to name objects and repeat phrases.  MMSE - Mini Mental State Exam 04/12/2016 12/30/2015 10/19/2015  Orientation to time '2 1 3  '$ Orientation to Place '4 3 4  '$ Registration '3 3 3  '$ Attention/ Calculation '5 3 4  '$ Recall 0 0 0  Language- name 2 objects '2 2 2  '$ Language- repeat '1 1 1  '$ Language- follow 3 step command '3 3 3  '$ Language- read & follow direction '1 1 1  '$ Write a sentence 1 0 1  Copy design '1 1 1  '$ Total score '23 18 23   '$ Cranial nerves: Pupils equal, round, reactive to light. Extraocular movements intact with no nystagmus. Visual fields: right inferior quadrantanopia. Facial sensation intact. No facial asymmetry. Tongue, uvula, palate midline.  Motor: Bulk and tone normal, muscle strength 5/5 throughout with no pronator drift but with difficulty lifting right arm above shoulder (denies pain).  Sensation to  light touch intact.  No extinction to double simultaneous stimulation.  Deep tendon reflexes 2+ throughout, toes downgoing.  Finger to nose testing intact.  Gait slow and cautious, unsteady with cane.   IMPRESSION: This is a pleasant 83 yo RH woman with vascular risk factors including hypertension, hyperlipidemia, diabetes, s/p pacemaker, and mild dementia. MMSE today 23/30 (23/30 in August 2017). She was recently admitted for weakness, MRI brain did not show any acute changes however her exam today shows a right inferior quadrantanopia. She is on Eliquis, continue control of vascular risk factors. Continue Aricept to '10mg'$  daily. She denies any side effects. Her sister is hoping to bring her back home in a familiar environment, we discussed home safety, she will be getting a home aid. Continue PT while at SNF. She does not drive. She will follow-up in 1 year and knows to call for any problems.   Thank you for allowing me to participate in her care.  Please do not hesitate to call for any questions or concerns.  The duration of this appointment visit was 25 minutes of face-to-face time with the patient.  Greater than 50% of this time was spent in counseling, explanation of diagnosis, planning of further management, and coordination of care.   Ellouise Newer, M.D.   CC: Dr. Quay Burow

## 2016-04-15 ENCOUNTER — Encounter: Payer: Self-pay | Admitting: Neurology

## 2016-04-19 DIAGNOSIS — I5022 Chronic systolic (congestive) heart failure: Secondary | ICD-10-CM | POA: Diagnosis not present

## 2016-04-19 DIAGNOSIS — R262 Difficulty in walking, not elsewhere classified: Secondary | ICD-10-CM | POA: Diagnosis not present

## 2016-04-21 ENCOUNTER — Encounter: Payer: Self-pay | Admitting: Pulmonary Disease

## 2016-04-21 ENCOUNTER — Ambulatory Visit (INDEPENDENT_AMBULATORY_CARE_PROVIDER_SITE_OTHER): Payer: Medicare Other | Admitting: Pulmonary Disease

## 2016-04-21 DIAGNOSIS — R918 Other nonspecific abnormal finding of lung field: Secondary | ICD-10-CM

## 2016-04-21 HISTORY — DX: Other nonspecific abnormal finding of lung field: R91.8

## 2016-04-21 NOTE — Patient Instructions (Signed)
Please provide the record I gave you today to the nursing facility so that your physician can see my notes I anticipate that this year you may develop cough or shortness of breath I am here to help you if you develop these symptoms, so I will arrange another follow-up visit with me in 3-4 months to see how you are doing.

## 2016-04-21 NOTE — Progress Notes (Signed)
Subjective:    Patient ID: Allison Dunn, female    DOB: 17-Feb-1932, 83 y.o.   MRN: [id number]  HPI Chief Complaint  Patient presents with  . Advice Only    referred for further evaluation of lung mass.  pt denies any current respiratory complaints.     Allison Dunn is here to see me because of an abnormal CT scan, she was referred by Dr. Sheppard Coil at [facility].  She had a CXR about a week ago that showed a lung mass.  She had a "terrible cough" at the time that sounded like a rattling in her chest.  No reported fever or chills.  Her weight has been dropping slightly in the last two years, but her sister thinks this has not been declining quickly in the last year.    No increase in cough in the last six months except with the recent cold.  She has not been diagnosed with pneumonia, she believes.    She is a lifelong non-smoker.  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    Her sister provides the entire history today because of the patient's dementia. The patient can provide very little history, but she has coherent speech.   Her legs and ankles have been swollen for a few weeks.     Past Medical History:  Diagnosis Date  . Arthritis   . Cancer [facility])    breast, left; s/p lumpectomy/chemo/radiation  . Depression   . Diabetes mellitus without complication ([place])   . Hyperlipidemia   . Hypertension   . Paroxysmal atrial fibrillation (HCC)   . Presence of permanent cardiac pacemaker   . Renal disorder      Family History  Problem Relation Age of Onset  . Cancer Mother     pancreatic  . Diabetes Sister   . Hypertension Sister   . Asthma Sister   . Hypertension Brother      Social History   Social History  . Marital status: Widowed    Spouse name: N/A  . Number of children: 0  . Years of education: N/A   Occupational History  . retired    Social History Main Topics  . Smoking status: Passive Smoke Exposure  - Never Smoker  . Smokeless tobacco: Never Used     Comment: pt's spouse was heavy smoker, smoked in home.   . Alcohol use No  . Drug use: No  . Sexual activity: Not on file   Other Topics Concern  . Not on file   Social History Narrative  . No narrative on file     No Known Allergies   Outpatient Medications Prior to Visit  Medication Sig Dispense Refill  . allopurinol (ZYLOPRIM) 300 MG tablet TAKE ONE TABLET BY MOUTH ONCE DAILY 90 tablet 0  . amiodarone (PACERONE) 200 MG tablet Take 1 tablet (200 mg total) by mouth daily. 90 tablet 1  . amLODipine (NORVASC) 5 MG tablet Take 1 tablet (5 mg total) by mouth daily. 90 tablet 3  . apixaban (ELIQUIS) 2.5 MG TABS tablet Take 2.5 mg by mouth 2 (two) times daily.    Marland Kitchen atorvastatin (LIPITOR) 80 MG tablet Take 1 tablet (80 mg total) by mouth daily with supper. 90 tablet 1  . benazepril (LOTENSIN) 10 MG tablet Take 1 tablet (10 mg total) by mouth daily. 90 tablet 3  . cyanocobalamin 500 MCG tablet Take 500 mcg by  mouth 3 (three) times daily.    Marland Kitchen donepezil (ARICEPT) 10 MG tablet Take 1 tablet (10 mg total) by mouth at bedtime. 90 tablet 3  . feeding supplement, GLUCERNA SHAKE, (GLUCERNA SHAKE) LIQD Take 237 mLs by mouth 2 (two) times daily between meals.    . Insulin Glargine (TOUJEO SOLOSTAR) 300 UNIT/ML SOPN Inject 4 Units into the skin at bedtime.    . insulin lispro (HUMALOG) 100 UNIT/ML injection Inject 5 units subcutaneously three times a day before meals with blood glucose greater than 200; inject 8 units subcutaneously before meals with CBG greater than 300    . Multiple Vitamins-Minerals (WOMENS MULTI VITAMIN & MINERAL PO) Take 1 tablet by mouth daily with lunch.      No facility-administered medications prior to visit.       Review of Systems  Constitutional: Negative for fever and unexpected weight change.  HENT: Negative for congestion, dental problem, ear pain, nosebleeds, postnasal drip, rhinorrhea, sinus pressure, sneezing,  sore throat and trouble swallowing.   Eyes: Negative for redness and itching.  Respiratory: Negative for cough, chest tightness, shortness of breath and wheezing.   Cardiovascular: Negative for palpitations and leg swelling.  Gastrointestinal: Negative for nausea and vomiting.  Genitourinary: Negative for dysuria.  Musculoskeletal: Negative for joint swelling.  Skin: Negative for rash.  Neurological: Negative for headaches.  Hematological: Does not bruise/bleed easily.  Psychiatric/Behavioral: Negative for dysphoric mood. The patient is not nervous/anxious.        Objective:   Physical Exam Vitals:   04/21/16 1052  BP: 132/68  Pulse: 62  SpO2: 99%  Weight: 120 lb (54.4 kg)  Height: 5' (1.524 m)  RA  Gen: chronically ill appearing, no acute distress HENT: NCAT, OP clear, neck supple without masses Eyes: PERRL, EOMi Lymph: no cervical lymphadenopathy PULM: Crackles bases B CV: Irreg irreg, systolic murmur GI: BS+, soft, nontender, no hsm Derm: no rash or skin breakdown, pitting edema hands, arms MSK: normal bulk and tone Neuro: Awake not oriented, speech clear, CN II-XII intact, strength 5/5 in all 4 extremities Psyche: normal mood and affect  CBC    Component Value Date/Time   WBC 8.8 04/03/2016   WBC 7.4 02/23/2016 0605   RBC 3.99 02/23/2016 0605   HGB 12.8 04/03/2016   HCT 41 04/03/2016   PLT 228 04/03/2016   MCV 92.0 02/23/2016 0605   MCH 30.8 02/23/2016 0605   MCHC 33.5 02/23/2016 0605   RDW 16.5 (H) 02/23/2016 0605   LYMPHSABS 0.7 08/01/2015 0926   MONOABS 0.5 08/01/2015 0926   EOSABS 0.1 08/01/2015 0926   BASOSABS 0.0 08/01/2015 0926     Echocardiogram reviewed from 2018 showing an LVEF of 20-25%  CT chest from this month images independently reviewed showing a right middle lobe mass, no mediastinal lymphadenopathy, questionable postobstructive airspace disease     Assessment & Plan:  Lung mass Allison Dunn is an 83 year old female with multiple  chronic medical problems including atrial fibrillation and systolic heart failure and a lifelong nonsmoker who presents to my clinic with a new diagnosis of a right middle lobe mass. I have independently reviewed the images and shown them to her and her sister today. I explained that I'm very concerned this represents a malignancy.  Though this would be an easy target to reach with bronchoscopy, the procedure itself would be difficult in the sense that she would need adjustment of her cardiac medications and even with that she would still be relatively high risk considering her  cardiac comorbidities.  We had a lengthy conversation today. I advised that if they decided they didn't want to have a biopsy I can help walk him through that process. However, she had already made up her mind prior to seeing me that she did not want any sort of procedure.  In summary, I feel that this likely represents a malignancy. Either recurrent breast cancer or a new diagnosis of lung cancer. I suspect that she will have worsening symptoms this year particular cough and shortness of breath.  We talked about this in no uncertain terms today and they're very clear that they do not want to proceed with any sort of diagnostic procedure.  I will have her come back in about 3-4 months to see how she's doing in regards to symptoms. Unhappy to help however necessary.    Current Outpatient Prescriptions:  .  allopurinol (ZYLOPRIM) 300 MG tablet, TAKE ONE TABLET BY MOUTH ONCE DAILY, Disp: 90 tablet, Rfl: 0 .  amiodarone (PACERONE) 200 MG tablet, Take 1 tablet (200 mg total) by mouth daily., Disp: 90 tablet, Rfl: 1 .  amLODipine (NORVASC) 5 MG tablet, Take 1 tablet (5 mg total) by mouth daily., Disp: 90 tablet, Rfl: 3 .  apixaban (ELIQUIS) 2.5 MG TABS tablet, Take 2.5 mg by mouth 2 (two) times daily., Disp: , Rfl:  .  atorvastatin (LIPITOR) 80 MG tablet, Take 1 tablet (80 mg total) by mouth daily with supper., Disp: 90 tablet,  Rfl: 1 .  benazepril (LOTENSIN) 10 MG tablet, Take 1 tablet (10 mg total) by mouth daily., Disp: 90 tablet, Rfl: 3 .  cyanocobalamin 500 MCG tablet, Take 500 mcg by mouth 3 (three) times daily., Disp: , Rfl:  .  donepezil (ARICEPT) 10 MG tablet, Take 1 tablet (10 mg total) by mouth at bedtime., Disp: 90 tablet, Rfl: 3 .  feeding supplement, GLUCERNA SHAKE, (GLUCERNA SHAKE) LIQD, Take 237 mLs by mouth 2 (two) times daily between meals., Disp: , Rfl:  .  Insulin Glargine (TOUJEO SOLOSTAR) 300 UNIT/ML SOPN, Inject 4 Units into the skin at bedtime., Disp: , Rfl:  .  insulin lispro (HUMALOG) 100 UNIT/ML injection, Inject 5 units subcutaneously three times a day before meals with blood glucose greater than 200; inject 8 units subcutaneously before meals with CBG greater than 300, Disp: , Rfl:  .  Multiple Vitamins-Minerals (WOMENS MULTI VITAMIN & MINERAL PO), Take 1 tablet by mouth daily with lunch. , Disp: , Rfl:

## 2016-04-21 NOTE — Assessment & Plan Note (Signed)
Mrs. Codd is an 83 year old female with multiple chronic medical problems including atrial fibrillation and systolic heart failure and a lifelong nonsmoker who presents to my clinic with a new diagnosis of a right middle lobe mass. I have independently reviewed the images and shown them to her and her sister today. I explained that I'm very concerned this represents a malignancy.  Though this would be an easy target to reach with bronchoscopy, the procedure itself would be difficult in the sense that she would need adjustment of her cardiac medications and even with that she would still be relatively high risk considering her cardiac comorbidities.  We had a lengthy conversation today. I advised that if they decided they didn't want to have a biopsy I can help walk him through that process. However, she had already made up her mind prior to seeing me that she did not want any sort of procedure.  In summary, I feel that this likely represents a malignancy. Either recurrent breast cancer or a new diagnosis of lung cancer. I suspect that she will have worsening symptoms this year particular cough and shortness of breath.  We talked about this in no uncertain terms today and they're very clear that they do not want to proceed with any sort of diagnostic procedure.  I will have her come back in about 3-4 months to see how she's doing in regards to symptoms. Unhappy to help however necessary.

## 2016-04-28 ENCOUNTER — Non-Acute Institutional Stay (SKILLED_NURSING_FACILITY): Payer: Medicare Other | Admitting: Internal Medicine

## 2016-04-28 ENCOUNTER — Encounter: Payer: Self-pay | Admitting: Internal Medicine

## 2016-04-28 DIAGNOSIS — G301 Alzheimer's disease with late onset: Secondary | ICD-10-CM | POA: Diagnosis not present

## 2016-04-28 DIAGNOSIS — N183 Chronic kidney disease, stage 3 unspecified: Secondary | ICD-10-CM

## 2016-04-28 DIAGNOSIS — I10 Essential (primary) hypertension: Secondary | ICD-10-CM | POA: Diagnosis not present

## 2016-04-28 DIAGNOSIS — F0280 Dementia in other diseases classified elsewhere without behavioral disturbance: Secondary | ICD-10-CM

## 2016-04-28 NOTE — Progress Notes (Signed)
Location:  [place] Room Number: [phone] Place of Service:  SNF [phone])  Noah Delaine. Sheppard Coil, MD  Patient Care Team: Binnie Rail, MD as PCP - General (Internal Medicine) Jola Schmidt, MD as Consulting Physician (Ophthalmology) Cameron Sprang, MD as Consulting Physician (Neurology) Evans Lance, MD as Consulting Physician (Cardiology)  Extended Emergency Contact Information Primary Emergency Contact: [name],[name] Address: [address]          [place], [place] [address] [name] of [place] Phone: [phone] Mobile Phone: [phone] Relation: Sister    Allergies: Patient has no known allergies.  Chief Complaint  Patient presents with  . Medical Management of Chronic Issues    Routine Visit    HPI: Patient is 83 y.o. female who is being seen for routine issues of CKD3, dementia and HTN.   Past Medical History:  Diagnosis Date  . Arthritis   . Cancer [facility])    breast, left; s/p lumpectomy/chemo/radiation  . Depression   . Diabetes mellitus without complication ([place])   . Hyperlipidemia   . Hypertension   . Paroxysmal atrial fibrillation (HCC)   . Presence of permanent cardiac pacemaker   . Renal disorder     Past Surgical History:  Procedure Laterality Date  . BREAST LUMPECTOMY Left   . CARDIAC PACEMAKER PLACEMENT    . CATARACT EXTRACTION    . EYE SURGERY    . kidney stone removal     x3  . LAPAROSCOPIC NEPHRECTOMY Left 07/29/2015   Procedure: LEFT LAPAROSCOPIC SIMPLE NEPHRECTOMY;  Surgeon: Ardis Hughs, MD;  Location: [facility];  Service: Urology;  Laterality: Left;  . LUMBAR SPINE SURGERY     laminectomy and rod    Allergies as of 04/28/2016   No Known Allergies     Medication List       Accurate as of 04/28/16 11:59 PM. Always use your most recent med list.          allopurinol 300 MG tablet Commonly known as:  ZYLOPRIM TAKE ONE TABLET BY MOUTH ONCE DAILY   amiodarone 200 MG tablet Commonly known as:   PACERONE Take 1 tablet (200 mg total) by mouth daily.   amLODipine 5 MG tablet Commonly known as:  NORVASC Take 1 tablet (5 mg total) by mouth daily.   apixaban 2.5 MG Tabs tablet Commonly known as:  ELIQUIS Take 2.5 mg by mouth 2 (two) times daily.   atorvastatin 80 MG tablet Commonly known as:  LIPITOR Take 1 tablet (80 mg total) by mouth daily with supper.   benazepril 10 MG tablet Commonly known as:  LOTENSIN Take 1 tablet (10 mg total) by mouth daily.   cyanocobalamin 500 MCG tablet Take 1,500 mcg by mouth daily with lunch. 3 tablets   donepezil 10 MG tablet Commonly known as:  ARICEPT Take 1 tablet (10 mg total) by mouth at bedtime.   feeding supplement (GLUCERNA SHAKE) Liqd Take 237 mLs by mouth 2 (two) times daily between meals.   insulin lispro 100 UNIT/ML injection Commonly known as:  HUMALOG Inject 5 units subcutaneously three times a day before meals with blood glucose greater than 200; inject 8 units subcutaneously before meals with CBG greater than 300   TOUJEO SOLOSTAR 300 UNIT/ML Sopn Generic drug:  Insulin Glargine Inject 4 Units into the skin at bedtime.   WOMENS MULTI VITAMIN & MINERAL PO Take 1 tablet by mouth daily with lunch.       No orders of  the defined types were placed in this encounter.   Immunization History  Administered Date(s) Administered  . Influenza, High Dose Seasonal PF 12/30/2015  . PPD Test 04/26/2015, 08/02/2015, 02/23/2016    Social History  Substance Use Topics  . Smoking status: Passive Smoke Exposure - Never Smoker  . Smokeless tobacco: Never Used     Comment: pt's spouse was heavy smoker, smoked in home.   . Alcohol use No    Review of Systems  DATA OBTAINED: from patient, nurse GENERAL:  no fevers, fatigue, appetite changes SKIN: No itching, rash HEENT: No complaint RESPIRATORY: No cough, wheezing, SOB CARDIAC: No chest pain, palpitations, lower extremity edema  GI: No abdominal pain, No N/V/D or  constipation, No heartburn or reflux  GU: No dysuria, frequency or urgency, or incontinence  MUSCULOSKELETAL: No unrelieved bone/joint pain NEUROLOGIC: No headache, dizziness  PSYCHIATRIC: No overt anxiety or sadness  Vitals:   04/28/16 1103  BP: (!) 163/69  Pulse: 60  Resp: 16  Temp: 97.7 F (36.5 C)   Body mass index is 24.14 kg/m. Physical Exam  GENERAL APPEARANCE: Alert, conversant, No acute distress  SKIN: No diaphoresis rash HEENT: Unremarkable RESPIRATORY: Breathing is even, unlabored. Lung sounds are clear   CARDIOVASCULAR: Heart RRR no murmurs, rubs or gallops. No peripheral edema  GASTROINTESTINAL: Abdomen is soft, non-tender, not distended w/ normal bowel sounds.  GENITOURINARY: Bladder non tender, not distended  MUSCULOSKELETAL: No abnormal joints or musculature NEUROLOGIC: Cranial nerves 2-12 grossly intact. Moves all extremities with mild RUE weakness PSYCHIATRIC: Mood and affect appropriate to situation with dementia, no behavioral issues  Patient Active Problem List   Diagnosis Date Noted  . Lung mass 04/21/2016  . Degenerative cervical spinal stenosis 03/01/2016  . Spinal cord compression due to degenerative disorder of spinal column ([place]) 03/01/2016  . Hypokalemia 03/01/2016  . Myelopathy ([place])   . Generalized weakness 02/19/2016  . Diaphragmatic injury as surgical complication 27/25/3664  . Postoperative anemia due to acute blood loss 08/14/2015  . Dementia without behavioral disturbance 08/14/2015  . Chronic UTI 07/29/2015  . 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 Klebsiella sepsis ([place]) 05/02/2015  . CAP (community acquired pneumonia) 05/02/2015  . Transaminasemia   . Sepsis ([place]) 04/22/2015  . Paroxysmal atrial fibrillation ([place]) 04/22/2015  . Delirium   . Urinary tract infectious disease   . Osteoarthritis 03/27/2015  . Cerebrovascular accident (CVA) due to thrombosis of cerebral artery ([place]) 03/23/2015  . Moderate tricuspid  regurgitation 03/11/2015  . Cardiomyopathy ([place]), EF 40-45%  02/2015 03/11/2015  . Mild mitral regurgitation 03/11/2015  . Pulmonary arterial hypertension ([place]), mild 03/11/2015  . Mild cognitive impairment 02/24/2015  . Type 2 diabetes mellitus without complication, with long-term current use of insulin ([place]) 02/24/2015  . Hypertensive heart disease with CHF (congestive heart failure) ([place]) 02/24/2015  . Diabetes ([place]) 02/21/2015  . Essential hypertension, benign 02/21/2015  . Hyperlipidemia 02/19/2015  . Gout 02/19/2015  . PAD (peripheral artery disease) ([place]) 02/19/2015  . Cardiac pacemaker in situ 02/19/2015  . CKD (chronic kidney disease) 02/19/2015    CMP     Component Value Date/Time   NA 141 04/03/2016   K 4.7 04/03/2016   CL 101 02/23/2016 0605   CO2 33 (H) 02/23/2016 0605   GLUCOSE 139 (H) 02/23/2016 0605   BUN 24 (A) 04/03/2016   CREATININE 1.1 04/03/2016   CREATININE 1.12 (H) 02/23/2016 0605   CALCIUM 8.8 (L) 02/23/2016 0605   PROT 7.0 06/21/2015 1317   ALBUMIN 3.3 (L) 06/21/2015 1317  AST 39 06/21/2015 1317   ALT 47 06/21/2015 1317   ALKPHOS 110 06/21/2015 1317   BILITOT 0.4 06/21/2015 1317   GFRNONAA 44 (L) 02/23/2016 0605   GFRAA 52 (L) 02/23/2016 0605    Recent Labs  02/19/16 1318 02/19/16 1633 02/21/16 0449  02/23/16 0605 03/27/16 04/03/16  NA 136  --  137  < > 138 141 141  K 3.2*  --  3.3*  --  4.1 4.2 4.7  CL 97*  --  98*  --  101  --   --   CO2 30  --  32  --  33*  --   --   GLUCOSE 183*  --  132*  --  139*  --   --   BUN 26*  --  21*  < > 19 19 24*  CREATININE 1.21*  --  1.12*  < > 1.12* 0.9 1.1  CALCIUM 9.1  --  9.0  --  8.8*  --   --   MG  --  1.8  --   --   --   --   --   < > = values in this interval not displayed.  Recent Labs  06/21/15 1317  AST 39  ALT 47  ALKPHOS 110  BILITOT 0.4  PROT 7.0  ALBUMIN 3.3*    Recent Labs  06/21/15 1317  08/01/15 0926 02/19/16 1318  02/23/16 0605 03/27/16 04/03/16  WBC 6.0  < > 14.6* 8.6   < > 7.4 8.8 8.8  NEUTROABS 3.7  --  13.3*  --   --   --   --   --   HGB 11.8*  < > 11.3* 14.2  < > 12.3 13.9 12.8  HCT 36.1  < > 33.2* 41.7  < > 36.7 42 41  MCV 94.5  < > 91.2 90.7  --  92.0  --   --   PLT 221  < > 190 218  < > 215 223 228  < > = values in this interval not displayed.  Recent Labs  02/20/16 0440 04/03/16  CHOL 222* 234*  LDLCALC 133* 135  TRIG 74 96   No results found for: [facility] Lab Results  Component Value Date   TSH 1.16 04/03/2016   Lab Results  Component Value Date   HGBA1C 8.4 04/03/2016   Lab Results  Component Value Date   CHOL 234 (A) 04/03/2016   HDL 80 (A) 04/03/2016   LDLCALC 135 04/03/2016   TRIG 96 04/03/2016   CHOLHDL 3.0 02/20/2016    Significant Diagnostic Results in last 30 days:  No results found.  Assessment and Plan  CKD (chronic kidney disease) No recent GFR; Cr stable at 1 ; will monitor at intervals  Dementia without behavioral disturbance Chronic and stable; plan to cont aricept 10 mg daily  Essential hypertension, benign Not well controlled ; will inc norvasc to 10 mg daily     Dejean Tribby D. Sheppard Coil, MD

## 2016-05-02 ENCOUNTER — Encounter: Payer: Self-pay | Admitting: Internal Medicine

## 2016-05-02 NOTE — Progress Notes (Signed)
Location:  [place] Room Number: [phone] Place of Service:  SNF [phone])  Allison Delaine. Sheppard Coil, MD  Patient Care Team: Binnie Rail, MD as PCP - General (Internal Medicine) Jola Schmidt, MD as Consulting Physician (Ophthalmology) Cameron Sprang, MD as Consulting Physician (Neurology) Evans Lance, MD as Consulting Physician (Cardiology)  Extended Emergency Contact Information Primary Emergency Contact: [name],[name] Address: [address]          [place], [place] [address] [name] of [place] Phone: [phone] Mobile Phone: [phone] Relation: Sister    Allergies: Patient has no known allergies.  Chief Complaint  Patient presents with  . Acute Visit    HPI: Patient is 83 y.o. female who   Past Medical History:  Diagnosis Date  . Arthritis   . Cancer [facility])    breast, left; s/p lumpectomy/chemo/radiation  . Depression   . Diabetes mellitus without complication ([place])   . Hyperlipidemia   . Hypertension   . Paroxysmal atrial fibrillation (HCC)   . Presence of permanent cardiac pacemaker   . Renal disorder     Past Surgical History:  Procedure Laterality Date  . BREAST LUMPECTOMY Left   . CARDIAC PACEMAKER PLACEMENT    . CATARACT EXTRACTION    . EYE SURGERY    . kidney stone removal     x3  . LAPAROSCOPIC NEPHRECTOMY Left 07/29/2015   Procedure: LEFT LAPAROSCOPIC SIMPLE NEPHRECTOMY;  Surgeon: Ardis Hughs, MD;  [facility];  Service: Urology;  Laterality: Left;  . LUMBAR SPINE SURGERY     laminectomy and rod    Allergies as of 05/02/2016   No Known Allergies     Medication List       Accurate as of 05/02/16  2:07 PM. Always use your most recent med list.          allopurinol 300 MG tablet Commonly known as:  ZYLOPRIM TAKE ONE TABLET BY MOUTH ONCE DAILY   amiodarone 200 MG tablet Commonly known as:  PACERONE Take 1 tablet (200 mg total) by mouth daily.   amLODipine 5 MG tablet Commonly known  as:  NORVASC Take 1 tablet (5 mg total) by mouth daily.   apixaban 2.5 MG Tabs tablet Commonly known as:  ELIQUIS Take 2.5 mg by mouth 2 (two) times daily.   atorvastatin 80 MG tablet Commonly known as:  LIPITOR Take 1 tablet (80 mg total) by mouth daily with supper.   benazepril 10 MG tablet Commonly known as:  LOTENSIN Take 1 tablet (10 mg total) by mouth daily.   cyanocobalamin 500 MCG tablet Take 1,500 mcg by mouth daily with lunch. 3 tablets   donepezil 10 MG tablet Commonly known as:  ARICEPT Take 1 tablet (10 mg total) by mouth at bedtime.   feeding supplement (GLUCERNA SHAKE) Liqd Take 237 mLs by mouth 2 (two) times daily between meals.   insulin lispro 100 UNIT/ML injection Commonly known as:  HUMALOG Inject 5 units subcutaneously three times a day before meals with blood glucose greater than 200; inject 8 units subcutaneously before meals with CBG greater than 300   TOUJEO SOLOSTAR 300 UNIT/ML Sopn Generic drug:  Insulin Glargine Inject 4 Units into the skin at bedtime.   WOMENS MULTI VITAMIN & MINERAL PO Take 1 tablet by mouth daily with lunch.       No orders of the defined types were placed in this encounter.   Immunization History  Administered Date(s) Administered  .  Influenza, High Dose Seasonal PF 12/30/2015  . PPD Test 04/26/2015, 08/02/2015, 02/23/2016    Social History  Substance Use Topics  . Smoking status: Passive Smoke Exposure - Never Smoker  . Smokeless tobacco: Never Used     Comment: pt's spouse was heavy smoker, smoked in home.   . Alcohol use No    Review of Systems  DATA OBTAINED: from patient, nurse, medical record, family member GENERAL:  no fevers, fatigue, appetite changes SKIN: No itching, rash HEENT: No complaint RESPIRATORY: No cough, wheezing, SOB CARDIAC: No chest pain, palpitations, lower extremity edema  GI: No abdominal pain, No N/V/D or constipation, No heartburn or reflux  GU: No dysuria, frequency or  urgency, or incontinence  MUSCULOSKELETAL: No unrelieved bone/joint pain NEUROLOGIC: No headache, dizziness  PSYCHIATRIC: No overt anxiety or sadness  Vitals:   05/02/16 1405  BP: (!) 163/69  Pulse: 72  Resp: 18  Temp: 97.7 F (36.5 C)   Body mass index is 23.87 kg/m. Physical Exam  GENERAL APPEARANCE: Alert, conversant, No acute distress  SKIN: No diaphoresis rash HEENT: Unremarkable RESPIRATORY: Breathing is even, unlabored. Lung sounds are clear   CARDIOVASCULAR: Heart RRR no murmurs, rubs or gallops. No peripheral edema  GASTROINTESTINAL: Abdomen is soft, non-tender, not distended w/ normal bowel sounds.  GENITOURINARY: Bladder non tender, not distended  MUSCULOSKELETAL: No abnormal joints or musculature NEUROLOGIC: Cranial nerves 2-12 grossly intact. Moves all extremities PSYCHIATRIC: Mood and affect appropriate to situation, no behavioral issues  Patient Active Problem List   Diagnosis Date Noted  . Lung mass 04/21/2016  . Degenerative cervical spinal stenosis 03/01/2016  . Spinal cord compression due to degenerative disorder of spinal column ([place]) 03/01/2016  . Hypokalemia 03/01/2016  . Myelopathy ([place])   . Generalized weakness 02/19/2016  . Diaphragmatic injury as surgical complication 97/67/3419  . Postoperative anemia due to acute blood loss 08/14/2015  . Dementia without behavioral disturbance 08/14/2015  . Chronic UTI 07/29/2015  . 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 Klebsiella sepsis ([place]) 05/02/2015  . CAP (community acquired pneumonia) 05/02/2015  . Transaminasemia   . Sepsis ([place]) 04/22/2015  . Paroxysmal atrial fibrillation ([place]) 04/22/2015  . Delirium   . Urinary tract infectious disease   . Osteoarthritis 03/27/2015  . Cerebrovascular accident (CVA) due to thrombosis of cerebral artery ([place]) 03/23/2015  . Moderate tricuspid regurgitation 03/11/2015  . Cardiomyopathy ([place]), EF 40-45%  02/2015 03/11/2015  . Mild mitral regurgitation  03/11/2015  . Pulmonary arterial hypertension ([place]), mild 03/11/2015  . Mild cognitive impairment 02/24/2015  . Type 2 diabetes mellitus without complication, with long-term current use of insulin ([place]) 02/24/2015  . Hypertensive heart disease with CHF (congestive heart failure) ([place]) 02/24/2015  . Diabetes ([place]) 02/21/2015  . Essential hypertension, benign 02/21/2015  . Hyperlipidemia 02/19/2015  . Gout 02/19/2015  . PAD (peripheral artery disease) ([place]) 02/19/2015  . Cardiac pacemaker in situ 02/19/2015  . CKD (chronic kidney disease) 02/19/2015    CMP     Component Value Date/Time   NA 141 04/03/2016   K 4.7 04/03/2016   CL 101 02/23/2016 0605   CO2 33 (H) 02/23/2016 0605   GLUCOSE 139 (H) 02/23/2016 0605   BUN 24 (A) 04/03/2016   CREATININE 1.1 04/03/2016   CREATININE 1.12 (H) 02/23/2016 0605   CALCIUM 8.8 (L) 02/23/2016 0605   PROT 7.0 06/21/2015 1317   ALBUMIN 3.3 (L) 06/21/2015 1317   AST 39 06/21/2015 1317   ALT 47 06/21/2015 1317   ALKPHOS 110 06/21/2015 1317  BILITOT 0.4 06/21/2015 1317   GFRNONAA 44 (L) 02/23/2016 0605   GFRAA 52 (L) 02/23/2016 0605    Recent Labs  02/19/16 1318 02/19/16 1633 02/21/16 0449  02/23/16 0605 03/27/16 04/03/16  NA 136  --  137  < > 138 141 141  K 3.2*  --  3.3*  --  4.1 4.2 4.7  CL 97*  --  98*  --  101  --   --   CO2 30  --  32  --  33*  --   --   GLUCOSE 183*  --  132*  --  139*  --   --   BUN 26*  --  21*  < > 19 19 24*  CREATININE 1.21*  --  1.12*  < > 1.12* 0.9 1.1  CALCIUM 9.1  --  9.0  --  8.8*  --   --   MG  --  1.8  --   --   --   --   --   < > = values in this interval not displayed.  Recent Labs  05/19/15 1022 06/21/15 1317  AST 36 39  ALT 30 47  ALKPHOS 120* 110  BILITOT 0.5 0.4  PROT 7.3 7.0  ALBUMIN 3.4* 3.3*    Recent Labs  06/21/15 1317  08/01/15 0926 02/19/16 1318  02/23/16 0605 03/27/16 04/03/16  WBC 6.0  < > 14.6* 8.6  < > 7.4 8.8 8.8  NEUTROABS 3.7  --  13.3*  --   --   --   --   --     HGB 11.8*  < > 11.3* 14.2  < > 12.3 13.9 12.8  HCT 36.1  < > 33.2* 41.7  < > 36.7 42 41  MCV 94.5  < > 91.2 90.7  --  92.0  --   --   PLT 221  < > 190 218  < > 215 223 228  < > = values in this interval not displayed.  Recent Labs  02/20/16 0440 04/03/16  CHOL 222* 234*  LDLCALC 133* 135  TRIG 74 96   No results found for: [facility] Lab Results  Component Value Date   TSH 1.16 04/03/2016   Lab Results  Component Value Date   HGBA1C 8.4 04/03/2016   Lab Results  Component Value Date   CHOL 234 (A) 04/03/2016   HDL 80 (A) 04/03/2016   LDLCALC 135 04/03/2016   TRIG 96 04/03/2016   CHOLHDL 3.0 02/20/2016    Significant Diagnostic Results in last 30 days:  Ct Chest W Contrast  Result Date: 04/04/2016 CLINICAL DATA:  Concern for mass on chest radiograph. Further evaluation requested. Initial encounter. EXAM: CT CHEST WITH CONTRAST TECHNIQUE: Multidetector CT imaging of the chest was performed during intravenous contrast administration. CONTRAST:  53m ISOVUE-300 IOPAMIDOL (ISOVUE-300) INJECTION 61% COMPARISON:  Chest radiograph performed 02/19/2016 FINDINGS: Cardiovascular: The heart is mildly enlarged. Diffuse coronary artery calcifications are seen. Scattered calcification is noted along the aortic arch and descending thoracic aorta. The great vessels demonstrate mild calcification but are otherwise unremarkable. Mediastinum/Nodes: There is a lobulated 3.8 x 3.3 x 3.6 cm mass at the anterior right hilum, extending into the right middle lung lobe, with adjacent opacity possibly reflecting postobstructive pneumonia. No definite mediastinal lymphadenopathy is seen. No pericardial effusion is identified. Scattered small hypodensities within the thyroid gland measure up to 1.3 cm, likely benign given their size. No axillary lymphadenopathy is appreciated. A pacemaker is noted at the right  chest wall, with leads ending at the right atrium and right ventricle. Lungs/Pleura: Trace fluid is  suggested tracking along the superior aspect of the left major fissure. Mild bibasilar atelectasis is noted. No additional pulmonary nodules are identified. No pneumothorax is seen. Upper Abdomen: The visualized portions of the liver and spleen are grossly unremarkable. The visualized portions of the pancreas and adrenal glands are within normal limits. Musculoskeletal: No acute osseous abnormalities are identified. The visualized musculature is unremarkable in appearance. IMPRESSION: 1. Lobulated 3.8 cm mass at the anterior right hilum, extending into the right middle lung lobe, with adjacent airspace opacity possibly reflecting postobstructive pneumonia. This is concerning for primary bronchogenic malignancy. Tissue diagnosis is recommended, as deemed clinically appropriate. 2. Trace fluid suggested tracking along the superior aspect of the left major fissure. Mild bibasilar atelectasis noted. 3. Mild cardiomegaly.  Diffuse coronary artery calcifications seen. Electronically Signed   By: Garald Balding M.D.   On: 04/04/2016 21:18    Assessment and Plan  No problem-specific Assessment & Plan notes found for this encounter.   Labs/tests ordered:    Allison Delaine. Sheppard Coil, MD

## 2016-05-03 ENCOUNTER — Encounter: Payer: Self-pay | Admitting: Internal Medicine

## 2016-05-03 NOTE — Progress Notes (Signed)
Location:  [facility] [facility] [place] Room Number: [phone] Place of Service:  [phone])  Noah Delaine. Sheppard Coil, MD  Patient Care Team: Binnie Rail, MD as PCP - General (Internal Medicine) Jola Schmidt, MD as Consulting Physician (Ophthalmology) Cameron Sprang, MD as Consulting Physician (Neurology) Evans Lance, MD as Consulting Physician (Cardiology)  Extended Emergency Contact Information Primary Emergency Contact: [name],[name] Address: [address]          [place], [place] [address] [name] of [place] Phone: [phone] Mobile Phone: [phone] Relation: Sister    Allergies: Patient has no known allergies.  Chief Complaint  Patient presents with  . Acute Visit    HPI: Patient is 83 y.o. female who is being seen acutely because an outbreak of Influenza A per CDC guidelines was recognized on 03/22/2016. Pt has had a cough and nasal congestion for past 2 days. No reported fever, no SOB, n/v/d.  Past Medical History:  Diagnosis Date  . Arthritis   . Cancer [facility])    breast, left; s/p lumpectomy/chemo/radiation  . Depression   . Diabetes mellitus without complication ([place])   . Hyperlipidemia   . Hypertension   . Paroxysmal atrial fibrillation (H[facility])   . Presence of permanent cardiac pacemaker   . Renal disorder     Past Surgical History:  Procedure Laterality Date  . BREAST LUMPECTOMY Left   . CARDIAC PACEMAKER PLACEMENT    . CATARACT EXTRACTION    . EYE SURGERY    . kidney stone removal     x3  . LAPAROSCOPIC NEPHRECTOMY Left 07/29/2015   Procedure: LEFT LAPAROSCOPIC SIMPLE NEPHRECTOMY;  Surgeon: Ardis Hughs, MD;  Location: [facility] ORS;  Service: Urology;  Laterality: Left;  . LUMBAR SPINE SURGERY     laminectomy and rod    Allergies as of 03/23/2016   No Known Allergies     Medication List       Accurate as of 03/23/16 11:59 PM. Always use your most recent med list.          allopurinol 300 MG tablet Commonly known as:   ZYLOPRIM TAKE ONE TABLET BY MOUTH ONCE DAILY   amiodarone 200 MG tablet Commonly known as:  PACERONE Take 1 tablet (200 mg total) by mouth daily.   amLODipine 5 MG tablet Commonly known as:  NORVASC Take 1 tablet (5 mg total) by mouth daily.   apixaban 2.5 MG Tabs tablet Commonly known as:  ELIQUIS Take 2.5 mg by mouth 2 (two) times daily.   atorvastatin 80 MG tablet Commonly known as:  LIPITOR Take 1 tablet (80 mg total) by mouth daily with supper.   benazepril 10 MG tablet Commonly known as:  LOTENSIN Take 1 tablet (10 mg total) by mouth daily.   cyanocobalamin 500 MCG tablet Take 1,500 mcg by mouth daily with lunch. 3 tablets   donepezil 10 MG tablet Commonly known as:  ARICEPT Take 1 tablet (10 mg total) by mouth at bedtime.   feeding supplement (GLUCERNA SHAKE) Liqd Take 237 mLs by mouth 2 (two) times daily between meals.   insulin lispro 100 UNIT/ML injection Commonly known as:  HUMALOG Inject 5 units subcutaneously three times a day before meals with blood glucose greater than 200; inject 8 units subcutaneously before meals with CBG greater than 300   TOUJEO SOLOSTAR 300 UNIT/ML Sopn Generic drug:  Insulin Glargine Inject 4 Units into the skin at bedtime.   WOMENS MULTI VITAMIN & MINERAL PO Take 1  tablet by mouth daily with lunch.       No orders of the defined types were placed in this encounter.   Immunization History  Administered Date(s) Administered  . Influenza, High Dose Seasonal PF 12/30/2015  . PPD Test 04/26/2015, 08/02/2015, 02/23/2016    Social History  Substance Use Topics  . Smoking status: Passive Smoke Exposure - Never Smoker  . Smokeless tobacco: Never Used     Comment: pt's spouse was heavy smoker, smoked in home.   . Alcohol use No    Review of Systems  DATA OBTAINED: from patient, nurse GENERAL:  no fevers SKIN: No itching, rash HEENT: + rhinorrhea,+ congestion, no ST or ear pain RESPIRATORY: + cough,- wheezing,-  SOB CARDIAC: No chest pain, palpitations, lower extremity edema  GI: No abdominal pain, No N/V/D or constipation, No heartburn or reflux  MUSCULOSKELETAL: No muscle aches NEUROLOGIC: No headache, dizziness   Vitals:   03/23/16 1536  BP: (!) 163/69  Pulse: 60  Resp: 16  Temp: 97.7 F (36.5 C)   Body mass index is 24.14 kg/m. Physical Exam  GENERAL APPEARANCE: Alert, conversant, No acute distress  SKIN: No diaphoresis rash HEENT: Unremarkable RESPIRATORY: Breathing is even, unlabored. Lung sounds are clear   CARDIOVASCULAR: Heart RRR no murmurs, rubs or gallops. No peripheral edema  GASTROINTESTINAL: Abdomen is soft, non-tender, not distended w/ normal bowel sounds.   NEUROLOGIC: Cranial nerves 2-12 grossly intact PSYCHIATRIC: baseline, no mental status changes  Patient Active Problem List   Diagnosis Date Noted  . Lung mass 04/21/2016  . Degenerative cervical spinal stenosis 03/01/2016  . Spinal cord compression due to degenerative disorder of spinal column ([place]) 03/01/2016  . Hypokalemia 03/01/2016  . Myelopathy ([place])   . Generalized weakness 02/19/2016  . Diaphragmatic injury as surgical complication 34/19/6222  . Postoperative anemia due to acute blood loss 08/14/2015  . Dementia without behavioral disturbance 08/14/2015  . Chronic UTI 07/29/2015  . 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 Klebsiella sepsis ([place]) 05/02/2015  . CAP (community acquired pneumonia) 05/02/2015  . Transaminasemia   . Sepsis ([place]) 04/22/2015  . Paroxysmal atrial fibrillation ([place]) 04/22/2015  . Delirium   . Urinary tract infectious disease   . Osteoarthritis 03/27/2015  . Cerebrovascular accident (CVA) due to thrombosis of cerebral artery ([place]) 03/23/2015  . Moderate tricuspid regurgitation 03/11/2015  . Cardiomyopathy ([place]), EF 40-45%  02/2015 03/11/2015  . Mild mitral regurgitation 03/11/2015  . Pulmonary arterial hypertension ([place]), mild 03/11/2015  . Mild cognitive  impairment 02/24/2015  . Type 2 diabetes mellitus without complication, with long-term current use of insulin ([place]) 02/24/2015  . Hypertensive heart disease with CHF (congestive heart failure) ([place]) 02/24/2015  . Diabetes ([place]) 02/21/2015  . Essential hypertension, benign 02/21/2015  . Hyperlipidemia 02/19/2015  . Gout 02/19/2015  . PAD (peripheral artery disease) ([place]) 02/19/2015  . Cardiac pacemaker in situ 02/19/2015  . CKD (chronic kidney disease) 02/19/2015    CMP     Component Value Date/Time   NA 141 04/03/2016   K 4.7 04/03/2016   CL 101 02/23/2016 0605   CO2 33 (H) 02/23/2016 0605   GLUCOSE 139 (H) 02/23/2016 0605   BUN 24 (A) 04/03/2016   CREATININE 1.1 04/03/2016   CREATININE 1.12 (H) 02/23/2016 0605   CALCIUM 8.8 (L) 02/23/2016 0605   PROT 7.0 06/21/2015 1317   ALBUMIN 3.3 (L) 06/21/2015 1317   AST 39 06/21/2015 1317   ALT 47 06/21/2015 1317   ALKPHOS 110 06/21/2015 1317   BILITOT 0.4 06/21/2015  1317   GFRNONAA 44 (L) 02/23/2016 0605   GFRAA 52 (L) 02/23/2016 0605    Recent Labs  02/19/16 1318 02/19/16 1633 02/21/16 0449  02/23/16 0605 03/27/16 04/03/16  NA 136  --  137  < > 138 141 141  K 3.2*  --  3.3*  --  4.1 4.2 4.7  CL 97*  --  98*  --  101  --   --   CO2 30  --  32  --  33*  --   --   GLUCOSE 183*  --  132*  --  139*  --   --   BUN 26*  --  21*  < > 19 19 24*  CREATININE 1.21*  --  1.12*  < > 1.12* 0.9 1.1  CALCIUM 9.1  --  9.0  --  8.8*  --   --   MG  --  1.8  --   --   --   --   --   < > = values in this interval not displayed.  Recent Labs  05/19/15 1022 06/21/15 1317  AST 36 39  ALT 30 47  ALKPHOS 120* 110  BILITOT 0.5 0.4  PROT 7.3 7.0  ALBUMIN 3.4* 3.3*    Recent Labs  06/21/15 1317  08/01/15 0926 02/19/16 1318  02/23/16 0605 03/27/16 04/03/16  WBC 6.0  < > 14.6* 8.6  < > 7.4 8.8 8.8  NEUTROABS 3.7  --  13.3*  --   --   --   --   --   HGB 11.8*  < > 11.3* 14.2  < > 12.3 13.9 12.8  HCT 36.1  < > 33.2* 41.7  < > 36.7 42 41   MCV 94.5  < > 91.2 90.7  --  92.0  --   --   PLT 221  < > 190 218  < > 215 223 228  < > = values in this interval not displayed.  Recent Labs  02/20/16 0440 04/03/16  CHOL 222* 234*  LDLCALC 133* 135  TRIG 74 96   No results found for: [facility] Lab Results  Component Value Date   TSH 1.16 04/03/2016   Lab Results  Component Value Date   HGBA1C 8.4 04/03/2016   Lab Results  Component Value Date   CHOL 234 (A) 04/03/2016   HDL 80 (A) 04/03/2016   LDLCALC 135 04/03/2016   TRIG 96 04/03/2016   CHOLHDL 3.0 02/20/2016    Significant Diagnostic Results in last 30 days:  Ct Chest W Contrast  Result Date: 04/04/2016 CLINICAL DATA:  Concern for mass on chest radiograph. Further evaluation requested. Initial encounter. EXAM: CT CHEST WITH CONTRAST TECHNIQUE: Multidetector CT imaging of the chest was performed during intravenous contrast administration. CONTRAST:  29m ISOVUE-300 IOPAMIDOL (ISOVUE-300) INJECTION 61% COMPARISON:  Chest radiograph performed 02/19/2016 FINDINGS: Cardiovascular: The heart is mildly enlarged. Diffuse coronary artery calcifications are seen. Scattered calcification is noted along the aortic arch and descending thoracic aorta. The great vessels demonstrate mild calcification but are otherwise unremarkable. Mediastinum/Nodes: There is a lobulated 3.8 x 3.3 x 3.6 cm mass at the anterior right hilum, extending into the right middle lung lobe, with adjacent opacity possibly reflecting postobstructive pneumonia. No definite mediastinal lymphadenopathy is seen. No pericardial effusion is identified. Scattered small hypodensities within the thyroid gland measure up to 1.3 cm, likely benign given their size. No axillary lymphadenopathy is appreciated. A pacemaker is noted at the right chest wall, with leads  ending at the right atrium and right ventricle. Lungs/Pleura: Trace fluid is suggested tracking along the superior aspect of the left major fissure. Mild bibasilar  atelectasis is noted. No additional pulmonary nodules are identified. No pneumothorax is seen. Upper Abdomen: The visualized portions of the liver and spleen are grossly unremarkable. The visualized portions of the pancreas and adrenal glands are within normal limits. Musculoskeletal: No acute osseous abnormalities are identified. The visualized musculature is unremarkable in appearance. IMPRESSION: 1. Lobulated 3.8 cm mass at the anterior right hilum, extending into the right middle lung lobe, with adjacent airspace opacity possibly reflecting postobstructive pneumonia. This is concerning for primary bronchogenic malignancy. Tissue diagnosis is recommended, as deemed clinically appropriate. 2. Trace fluid suggested tracking along the superior aspect of the left major fissure. Mild bibasilar atelectasis noted. 3. Mild cardiomegaly.  Diffuse coronary artery calcifications seen. Electronically Signed   By: Garald Balding M.D.   On: 04/04/2016 21:18    Assessment and Plan  EXPOSURE TO FLU/ INFLUENZA OUTBREAK AT SNF/FLU LIKE SYMPTOMS - have ordered CXR, CBC, BMP and flu test. Per CDC guidelines pt will be covered as if she has the flu until the flu test is back. CrCl calculated by me is 36. Pt will be treated with Tamiflu 30 mg BID for 5 days                                                                         Noah Delaine. Sheppard Coil, MD

## 2016-05-11 ENCOUNTER — Encounter: Payer: Self-pay | Admitting: Internal Medicine

## 2016-05-13 ENCOUNTER — Encounter: Payer: Self-pay | Admitting: Internal Medicine

## 2016-05-20 ENCOUNTER — Emergency Department (HOSPITAL_BASED_OUTPATIENT_CLINIC_OR_DEPARTMENT_OTHER)
Admit: 2016-05-20 | Discharge: 2016-05-20 | Disposition: A | Discharge status: Home / Self Care | Payer: Medicare Other | Attending: Emergency Medicine | Admitting: Emergency Medicine

## 2016-05-20 ENCOUNTER — Encounter: Payer: Self-pay | Admitting: Internal Medicine

## 2016-05-20 ENCOUNTER — Emergency Department (HOSPITAL_COMMUNITY)
Admission: EM | Admit: 2016-05-20 | Discharge: 2016-05-20 | Disposition: A | Discharge status: Home / Self Care | Payer: Medicare Other | Attending: Emergency Medicine | Admitting: Emergency Medicine

## 2016-05-20 ENCOUNTER — Emergency Department (HOSPITAL_COMMUNITY): Payer: Medicare Other

## 2016-05-20 ENCOUNTER — Encounter (HOSPITAL_COMMUNITY): Payer: Self-pay | Admitting: Emergency Medicine

## 2016-05-20 DIAGNOSIS — M25511 Pain in right shoulder: Secondary | ICD-10-CM | POA: Diagnosis not present

## 2016-05-20 DIAGNOSIS — Y929 Unspecified place or not applicable: Secondary | ICD-10-CM | POA: Insufficient documentation

## 2016-05-20 DIAGNOSIS — Y939 Activity, unspecified: Secondary | ICD-10-CM | POA: Insufficient documentation

## 2016-05-20 DIAGNOSIS — Z8673 Personal history of transient ischemic attack (TIA), and cerebral infarction without residual deficits: Secondary | ICD-10-CM | POA: Insufficient documentation

## 2016-05-20 DIAGNOSIS — S59911A Unspecified injury of right forearm, initial encounter: Secondary | ICD-10-CM | POA: Diagnosis not present

## 2016-05-20 DIAGNOSIS — E119 Type 2 diabetes mellitus without complications: Secondary | ICD-10-CM | POA: Diagnosis not present

## 2016-05-20 DIAGNOSIS — Z950 Presence of cardiac pacemaker: Secondary | ICD-10-CM | POA: Diagnosis not present

## 2016-05-20 DIAGNOSIS — R030 Elevated blood-pressure reading, without diagnosis of hypertension: Secondary | ICD-10-CM | POA: Diagnosis not present

## 2016-05-20 DIAGNOSIS — W1830XA Fall on same level, unspecified, initial encounter: Secondary | ICD-10-CM | POA: Diagnosis not present

## 2016-05-20 DIAGNOSIS — I509 Heart failure, unspecified: Secondary | ICD-10-CM | POA: Diagnosis not present

## 2016-05-20 DIAGNOSIS — Y999 Unspecified external cause status: Secondary | ICD-10-CM | POA: Insufficient documentation

## 2016-05-20 DIAGNOSIS — N189 Chronic kidney disease, unspecified: Secondary | ICD-10-CM | POA: Insufficient documentation

## 2016-05-20 DIAGNOSIS — Z7722 Contact with and (suspected) exposure to environmental tobacco smoke (acute) (chronic): Secondary | ICD-10-CM | POA: Insufficient documentation

## 2016-05-20 DIAGNOSIS — Z794 Long term (current) use of insulin: Secondary | ICD-10-CM | POA: Insufficient documentation

## 2016-05-20 DIAGNOSIS — M79609 Pain in unspecified limb: Secondary | ICD-10-CM | POA: Diagnosis not present

## 2016-05-20 DIAGNOSIS — I130 Hypertensive heart and chronic kidney disease with heart failure and stage 1 through stage 4 chronic kidney disease, or unspecified chronic kidney disease: Secondary | ICD-10-CM | POA: Diagnosis not present

## 2016-05-20 DIAGNOSIS — G894 Chronic pain syndrome: Secondary | ICD-10-CM | POA: Diagnosis not present

## 2016-05-20 DIAGNOSIS — M79631 Pain in right forearm: Secondary | ICD-10-CM | POA: Diagnosis not present

## 2016-05-20 DIAGNOSIS — M19011 Primary osteoarthritis, right shoulder: Secondary | ICD-10-CM | POA: Diagnosis not present

## 2016-05-20 DIAGNOSIS — S4991XA Unspecified injury of right shoulder and upper arm, initial encounter: Secondary | ICD-10-CM | POA: Diagnosis not present

## 2016-05-20 DIAGNOSIS — M1990 Unspecified osteoarthritis, unspecified site: Secondary | ICD-10-CM | POA: Diagnosis not present

## 2016-05-20 DIAGNOSIS — M25529 Pain in unspecified elbow: Secondary | ICD-10-CM | POA: Diagnosis not present

## 2016-05-20 DIAGNOSIS — Z7901 Long term (current) use of anticoagulants: Secondary | ICD-10-CM | POA: Insufficient documentation

## 2016-05-20 DIAGNOSIS — Z853 Personal history of malignant neoplasm of breast: Secondary | ICD-10-CM | POA: Diagnosis not present

## 2016-05-20 DIAGNOSIS — M25521 Pain in right elbow: Secondary | ICD-10-CM | POA: Diagnosis not present

## 2016-05-20 DIAGNOSIS — S59901A Unspecified injury of right elbow, initial encounter: Secondary | ICD-10-CM | POA: Diagnosis not present

## 2016-05-20 DIAGNOSIS — M79601 Pain in right arm: Secondary | ICD-10-CM | POA: Diagnosis not present

## 2016-05-20 DIAGNOSIS — W19XXXA Unspecified fall, initial encounter: Secondary | ICD-10-CM

## 2016-05-20 NOTE — ED Notes (Signed)
Pt unable to hold bladder en route with EMS. Pt request bed change.

## 2016-05-20 NOTE — Assessment & Plan Note (Signed)
Chronic and stable; plan to cont aricept 10 mg daily

## 2016-05-20 NOTE — Assessment & Plan Note (Signed)
No recent GFR; Cr stable at 1 ; will monitor at intervals

## 2016-05-20 NOTE — ED Notes (Signed)
Bed: WA04 Expected date:  Expected time:  Means of arrival:  Comments: 83 yo tricep pain

## 2016-05-20 NOTE — Assessment & Plan Note (Signed)
Not wellcontrolled ;will inc norvasc to 10 mg daily 

## 2016-05-20 NOTE — ED Notes (Signed)
Vascular paged and called back

## 2016-05-20 NOTE — Discharge Instructions (Signed)
Xray shows some mild arthritis of the shoulder Continue usual home medications Alternate between ice and heat as needed for pain, using ice/heat pack for no more than 20 minutes per hour Use tylenol for additional pain relief Follow up with your primary care doctor in 5-7 days for recheck of symptoms Return to the ER for acute changes or worsening symptoms

## 2016-05-20 NOTE — ED Notes (Signed)
Report given to Jacques Navy at facility. [facility] called and en route to transport back to facility.

## 2016-05-20 NOTE — ED Triage Notes (Signed)
Per EMS pt from [facility] for evaluation of right arm pain. Pt hx of dementia; alert and oriented per normal. Pt denies pain with triage; pain only with palpation; denies injury. Pt alert to self, place, and situation but forgetful. Pt disoriented to time.

## 2016-05-20 NOTE — ED Notes (Signed)
[facility] here to pick up pt.Marland Kitchen

## 2016-05-20 NOTE — Progress Notes (Signed)
VASCULAR LAB PRELIMINARY  PRELIMINARY  PRELIMINARY  PRELIMINARY  Right upper extremity venous duplexcompleted.    Preliminary report:  There is no DVT or SVT noted in the right upper extremity.  Gave report to Margarita Mail, PA and Dr. Augustine Radar, [facility], RVT 05/20/2016, 6:41 PM

## 2016-05-20 NOTE — ED Provider Notes (Signed)
Care assumed from [facility], PA-C, at shift change, please see their notes for full documentation of patient's complaint/HPI. Briefly, pt here with R arm pain, pt is demented and thus cannot contribute to history very much; nursing home noted she was guarding the R arm. Results so far show Xrays of shoulder, forearm, and elbow negative aside from mild osteopenia and chondrocalcinosis of the [facility] joint and superior margin of humeral head. Awaiting DVT U/S to r/o DVT. Plan is to await U/S results, if negative then d/c back to [facility]; if positive, then admit for DVT management.    Physical Exam  BP (!) 182/80   Pulse (!) 57   Temp 98.1 F (36.7 C) (Oral)   Resp 10   Ht '4\' 9"'$  (1.448 m)   Wt 56.7 kg   SpO2 95%   BMI 27.05 kg/m   Physical Exam Gen: afebrile, NAD HEENT: EOMI, MMM Resp: no resp distress CV: rate WNL, distal pulses intact in all extremities Abd: appearance normal, nondistended MsK: R shoulder with mild TTP near [facility] joint and along joint line, extending somewhat into the biceps, limited ROM due to pain, no tenderness to elbow/forearm/wrist/hand, no swelling/bruising/erythema/warmth appreciated in the entire RUE or in the joints of the RUE, distal pulses intact, warm and skin unremarkable, sensation grossly intact, grip strength intact, soft compartments   ED Course  Procedures Dg Shoulder Right  Result Date: 05/20/2016 CLINICAL DATA:  Arm pain since a recent fall.  Initial encounter. EXAM: RIGHT SHOULDER - 2+ VIEW COMPARISON:  None. FINDINGS: There is no evidence of acute fracture or dislocation. Chondrocalcinosis is noted at the [facility] joint as well as along the superior margin of the humeral head. A right subclavian approach dual lead pacemaker is partially visualized. No destructive osseous lesion is seen. IMPRESSION: No evidence of acute osseous abnormality. Electronically Signed   By: Logan Bores M.D.   On: 05/20/2016 14:27   Dg Elbow Complete Right (3+view)  Result Date:  05/20/2016 CLINICAL DATA:  Status post fall.  Pain. EXAM: RIGHT ELBOW - COMPLETE 3+ VIEW COMPARISON:  None. FINDINGS: There is no evidence of fracture, dislocation, or joint effusion. There is generalized osteopenia. There is no evidence of arthropathy or other focal bone abnormality. Soft tissues are unremarkable. IMPRESSION: No acute osseous injury of the right elbow. Electronically Signed   By: Kathreen Devoid   On: 05/20/2016 14:26   Dg Forearm Right  Result Date: 05/20/2016 CLINICAL DATA:  Pt c/o "a little" arm pain since recent fall, pt states "they wouldn't let me get up" on waking this morning. Hx dementia EXAM: RIGHT FOREARM - 2 VIEW COMPARISON:  None. FINDINGS: No fracture.  No bone lesion.  Bones are demineralized. The elbow and wrist joints are normally aligned. The soft tissues are unremarkable other than arterial vascular calcifications. IMPRESSION: No fracture or dislocation. Electronically Signed   By: Lajean Manes M.D.   On: 05/20/2016 14:24    RUE DVT U/S:  Progress Notes Date of Service: 05/20/2016 6:41 PM Candace Zadie Rhine, RVS Vascular Lab    VASCULAR LAB PRELIMINARY  PRELIMINARY  PRELIMINARY  PRELIMINARY  Right upper extremity venous duplexcompleted.    Preliminary report:  There is no DVT or SVT noted in the right upper extremity.  Gave report to Margarita Mail, PA and Dr. Augustine Radar, [facility], RVT 05/20/2016, 6:41 PM      Meds ordered this encounter  Medications  . HUMALOG KWIKPEN 100 UNIT/ML KiwkPen    Sig: Inject  5 Units into the skin 3 (three) times daily before meals. When CBG is greater than 200; inject 8 units with a CBG greater than 300  . benazepril (LOTENSIN) 20 MG tablet    Sig: Take 20 mg by mouth daily.      MDM:   ICD-9-CM ICD-10-CM   1. Right shoulder pain, unspecified chronicity 719.41 M25.511   2. Fall E888.9 W19.Merril Abbe DG ELBOW COMPLETE RIGHT (3+VIEW)     DG ELBOW COMPLETE RIGHT (3+VIEW)  3. Arthritis of right shoulder region 716.91  M19.011     6:57 PM- DVT U/S negative. Pain likely from arthritis/chondrocalcinosis of AC joint and shoulder, however could be from adhesive capsulitis given pt's age and comorbidities. Given lack of acute gout findings (no warmth/erythema/swelling) will not start on any other gout medications; already on allopurinol; has DM2 so want to avoid prednisone, and on eliquis and has CKD so want to avoid NSAIDs or colchicine. Can use tylenol, heat/ice, and continue home medications. F/up with her PCP in 3-5 days for recheck and ongoing management. Pt stable to return to nursing home. Discharge instructions sent with pt back to nursing home.       [address] PA-C 05/20/16 1901    Julianne Rice, MD 05/21/16 [phone]

## 2016-05-20 NOTE — ED Provider Notes (Signed)
[facility] DEPT Provider Note   CSN: [id number] Arrival date & time: 05/20/16  1321     History   Chief Complaint Chief Complaint  Patient presents with  . Arm Pain    HPI Allison Dunn is a 83 y.o. female.  The history is provided by the patient and medical records. No language interpreter was used.  Arm Pain    Allison Dunn is a 83 y.o. female  with a PMH as listed below who presents to the Emergency Department from [place] facility for evaluation of right arm pain. Per EMS, facility was helping her ambulate today and noticed that she grimaced when touching her right upper arm. They noticed she was holding her right arm still and seemed to have pain when they palpated, therefore sent her to ER for further evaluation. No recent falls or known injury. Baseline mental status per EMS report from facility.   Level V caveat 2/2 dementia.    Past Medical History:  Diagnosis Date  . Arthritis   . Cancer [facility])    breast, left; s/p lumpectomy/chemo/radiation  . Depression   . Diabetes mellitus without complication ([place])   . Hyperlipidemia   . Hypertension   . Paroxysmal atrial fibrillation (HCC)   . Presence of permanent cardiac pacemaker   . Renal disorder     Patient Active Problem List   Diagnosis Date Noted  . Lung mass 04/21/2016  . Degenerative cervical spinal stenosis 03/01/2016  . Spinal cord compression due to degenerative disorder of spinal column ([place]) 03/01/2016  . Hypokalemia 03/01/2016  . Myelopathy ([place])   . Generalized weakness 02/19/2016  . Diaphragmatic injury as surgical complication 12/45/8099  . Postoperative anemia due to acute blood loss 08/14/2015  . Dementia without behavioral disturbance 08/14/2015  . Chronic UTI 07/29/2015  . 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 Klebsiella sepsis ([place]) 05/02/2015  . CAP (community acquired pneumonia) 05/02/2015  . Transaminasemia   . Sepsis ([place]) 04/22/2015  .  Paroxysmal atrial fibrillation ([place]) 04/22/2015  . Delirium   . Urinary tract infectious disease   . Osteoarthritis 03/27/2015  . Cerebrovascular accident (CVA) due to thrombosis of cerebral artery ([place]) 03/23/2015  . Moderate tricuspid regurgitation 03/11/2015  . Cardiomyopathy ([place]), EF 40-45%  02/2015 03/11/2015  . Mild mitral regurgitation 03/11/2015  . Pulmonary arterial hypertension ([place]), mild 03/11/2015  . Mild cognitive impairment 02/24/2015  . Type 2 diabetes mellitus without complication, with long-term current use of insulin ([place]) 02/24/2015  . Hypertensive heart disease with CHF (congestive heart failure) ([place]) 02/24/2015  . Diabetes ([place]) 02/21/2015  . Essential hypertension, benign 02/21/2015  . Hyperlipidemia 02/19/2015  . Gout 02/19/2015  . PAD (peripheral artery disease) ([place]) 02/19/2015  . Cardiac pacemaker in situ 02/19/2015  . CKD (chronic kidney disease) 02/19/2015    Past Surgical History:  Procedure Laterality Date  . BREAST LUMPECTOMY Left   . CARDIAC PACEMAKER PLACEMENT    . CATARACT EXTRACTION    . EYE SURGERY    . kidney stone removal     x3  . LAPAROSCOPIC NEPHRECTOMY Left 07/29/2015   Procedure: LEFT LAPAROSCOPIC SIMPLE NEPHRECTOMY;  Surgeon: Ardis Hughs, MD;  Location: [facility] ORS;  Service: Urology;  Laterality: Left;  . LUMBAR SPINE SURGERY     laminectomy and rod    OB History    No data available       Home Medications    Prior to Admission medications   Medication Sig Start Date End Date  Taking? Authorizing Provider  allopurinol (ZYLOPRIM) 300 MG tablet TAKE ONE TABLET BY MOUTH ONCE DAILY 12/27/15  Yes Binnie Rail, MD  amiodarone (PACERONE) 200 MG tablet Take 1 tablet (200 mg total) by mouth daily. 09/03/15  Yes Binnie Rail, MD  amLODipine (NORVASC) 5 MG tablet Take 1 tablet (5 mg total) by mouth daily. 09/06/15  Yes Binnie Rail, MD  apixaban (ELIQUIS) 2.5 MG TABS tablet Take 2.5 mg by mouth 2 (two) times daily.   Yes Historical  Provider, MD  atorvastatin (LIPITOR) 80 MG tablet Take 1 tablet (80 mg total) by mouth daily with supper. 09/03/15  Yes Binnie Rail, MD  benazepril (LOTENSIN) 20 MG tablet Take 20 mg by mouth daily.  05/04/16  Yes Historical Provider, MD  cyanocobalamin 500 MCG tablet Take 1,500 mcg by mouth daily with lunch. 3 tablets   Yes Historical Provider, MD  donepezil (ARICEPT) 10 MG tablet Take 1 tablet (10 mg total) by mouth at bedtime. 10/19/15  Yes Cameron Sprang, MD  feeding supplement, GLUCERNA SHAKE, (GLUCERNA SHAKE) LIQD Take 237 mLs by mouth 2 (two) times daily between meals.   Yes Historical Provider, MD  HUMALOG KWIKPEN 100 UNIT/ML KiwkPen Inject 5 Units into the skin 3 (three) times daily before meals. When CBG is greater than 200; inject 8 units with a CBG greater than 300 05/09/16  Yes Historical Provider, MD  Insulin Glargine (TOUJEO SOLOSTAR) 300 UNIT/ML SOPN Inject 4 Units into the skin at bedtime.   Yes Historical Provider, MD  Multiple Vitamins-Minerals (WOMENS MULTI VITAMIN & MINERAL PO) Take 1 tablet by mouth daily with lunch.    Yes Historical Provider, MD  benazepril (LOTENSIN) 10 MG tablet Take 1 tablet (10 mg total) by mouth daily. Patient not taking: Reported on 05/20/2016 09/06/15   Binnie Rail, MD    Family History Family History  Problem Relation Age of Onset  . Cancer Mother     pancreatic  . Diabetes Sister   . Hypertension Sister   . Asthma Sister   . Hypertension Brother     Social History Social History  Substance Use Topics  . Smoking status: Passive Smoke Exposure - Never Smoker  . Smokeless tobacco: Never Used     Comment: pt's spouse was heavy smoker, smoked in home.   . Alcohol use No     Allergies   Patient has no known allergies.   Review of Systems Review of Systems  Unable to perform ROS: Dementia     Physical Exam Updated Vital Signs BP (!) 182/80   Pulse (!) 57   Temp 98.1 F (36.7 C) (Oral)   Resp 10   Ht '4\' 9"'$  (1.448 m)   Wt 56.7  kg   SpO2 95%   BMI 27.05 kg/m   Physical Exam  Constitutional: She appears well-developed and well-nourished. No distress.  HENT:  Head: Normocephalic and atraumatic.  Neck:  No midline tenderness.   Cardiovascular: Normal rate, regular rhythm and normal heart sounds.   No murmur heard. Pulmonary/Chest: Effort normal and breath sounds normal. No respiratory distress.  Abdominal: Soft. She exhibits no distension. There is no tenderness.  Musculoskeletal:  Diffuse tenderness to right forearm and shoulder. Mild swelling to right forearm compared to left. Holds right arm in a guarded position. 2+ radial pulses bilaterally.   Neurological:  Alert and oriented to self and place which appears to be baseline mental status. Sensation intact to bilateral LE's.   Skin: Skin is  warm and dry.  Nursing note and vitals reviewed.    ED Treatments / Results  Labs (all labs ordered are listed, but only abnormal results are displayed) Labs Reviewed - No data to display  EKG  EKG Interpretation None       Radiology Dg Shoulder Right  Result Date: 05/20/2016 CLINICAL DATA:  Arm pain since a recent fall.  Initial encounter. EXAM: RIGHT SHOULDER - 2+ VIEW COMPARISON:  None. FINDINGS: There is no evidence of acute fracture or dislocation. Chondrocalcinosis is noted at the [facility] joint as well as along the superior margin of the humeral head. A right subclavian approach dual lead pacemaker is partially visualized. No destructive osseous lesion is seen. IMPRESSION: No evidence of acute osseous abnormality. Electronically Signed   By: Logan Bores M.D.   On: 05/20/2016 14:27   Dg Elbow Complete Right (3+view)  Result Date: 05/20/2016 CLINICAL DATA:  Status post fall.  Pain. EXAM: RIGHT ELBOW - COMPLETE 3+ VIEW COMPARISON:  None. FINDINGS: There is no evidence of fracture, dislocation, or joint effusion. There is generalized osteopenia. There is no evidence of arthropathy or other focal bone abnormality.  Soft tissues are unremarkable. IMPRESSION: No acute osseous injury of the right elbow. Electronically Signed   By: Kathreen Devoid   On: 05/20/2016 14:26   Dg Forearm Right  Result Date: 05/20/2016 CLINICAL DATA:  Pt c/o "a little" arm pain since recent fall, pt states "they wouldn't let me get up" on waking this morning. Hx dementia EXAM: RIGHT FOREARM - 2 VIEW COMPARISON:  None. FINDINGS: No fracture.  No bone lesion.  Bones are demineralized. The elbow and wrist joints are normally aligned. The soft tissues are unremarkable other than arterial vascular calcifications. IMPRESSION: No fracture or dislocation. Electronically Signed   By: Lajean Manes M.D.   On: 05/20/2016 14:24    Procedures Procedures (including critical care time)  Medications Ordered in ED Medications - No data to display   Initial Impression / Assessment and Plan / ED Course  I have reviewed the triage vital signs and the nursing notes.  Pertinent labs & imaging results that were available during my care of the patient were reviewed by me and considered in my medical decision making (see chart for details).    KEZIYAH KNEALE is a 83 y.o. female with hx of dementia who presents to ED from facility  for right arm pain. No known fall or injury. On exam, patient does hold right arm in a guarded position and she has some swelling to right forearm compared to left. RUE is NVI. X-rays negative. RUE ultrasound obtained to rule out clot as source of pain / swelling. Imaging pending at shift change. Care assumed by oncoming provider PA Street. Case discussed, plan agreed upon. Will follow up on ultrasound, if negative, discharge back to facility with ortho vs. pcp follow up.    Patient seen by and discussed with Dr. Lita Mains who agrees with treatment plan.    Final Clinical Impressions(s) / ED Diagnoses   Final diagnoses:  Fall    New Prescriptions New Prescriptions   No medications on file     [facility] Bill Mcvey,  PA-C 05/20/16 1624    Julianne Rice, MD 05/21/16 1110

## 2016-06-06 ENCOUNTER — Non-Acute Institutional Stay (SKILLED_NURSING_FACILITY): Payer: Medicare Other | Admitting: Internal Medicine

## 2016-06-06 ENCOUNTER — Encounter: Payer: Self-pay | Admitting: Internal Medicine

## 2016-06-06 DIAGNOSIS — I480 Paroxysmal atrial fibrillation: Secondary | ICD-10-CM | POA: Diagnosis not present

## 2016-06-06 DIAGNOSIS — M109 Gout, unspecified: Secondary | ICD-10-CM

## 2016-06-06 DIAGNOSIS — E785 Hyperlipidemia, unspecified: Secondary | ICD-10-CM | POA: Diagnosis not present

## 2016-06-06 NOTE — Progress Notes (Signed)
Location:  [facility] Room Number: [phone] Place of Service:  SNF [phone])  Allison Delaine. Sheppard Coil, MD  Patient Care Team: Binnie Rail, MD as PCP - General (Internal Medicine) Jola Schmidt, MD as Consulting Physician (Ophthalmology) Delice Lesch Lezlie Octave, MD as Consulting Physician (Neurology) Evans Lance, MD as Consulting Physician (Cardiology)  Extended Emergency Contact Information Primary Emergency Contact: [name],[name] Address: [address]          [place], [place] [address] [name] of [place] Phone: [phone] Mobile Phone: [phone] Relation: Sister    Allergies: Patient has no known allergies.  Chief Complaint  Patient presents with  . Medical Management of Chronic Issues    Routine Visit    HPI: Patient is 83 y.o. female who is being sen for routine issues of PAF, HLD and gout.  Past Medical History:  Diagnosis Date  . Arthritis   . Cancer [facility])    breast, left; s/p lumpectomy/chemo/radiation  . Depression   . Diabetes mellitus without complication ([place])   . Hyperlipidemia   . Hypertension   . Paroxysmal atrial fibrillation (HCC)   . Presence of permanent cardiac pacemaker   . Renal disorder     Past Surgical History:  Procedure Laterality Date  . BREAST LUMPECTOMY Left   . CARDIAC PACEMAKER PLACEMENT    . CATARACT EXTRACTION    . EYE SURGERY    . kidney stone removal     x3  . LAPAROSCOPIC NEPHRECTOMY Left 07/29/2015   Procedure: LEFT LAPAROSCOPIC SIMPLE NEPHRECTOMY;  Surgeon: Ardis Hughs, MD;  Location: [facility];  Service: Urology;  Laterality: Left;  . LUMBAR SPINE SURGERY     laminectomy and rod    Allergies as of 06/06/2016   No Known Allergies     Medication List       Accurate as of 06/06/16 11:59 PM. Always use your most recent med list.          allopurinol 300 MG tablet Commonly known as:  ZYLOPRIM TAKE ONE TABLET BY MOUTH ONCE DAILY   amiodarone 200 MG tablet Commonly known as:   PACERONE Take 1 tablet (200 mg total) by mouth daily.   amLODipine 5 MG tablet Commonly known as:  NORVASC Take 1 tablet (5 mg total) by mouth daily.   apixaban 2.5 MG Tabs tablet Commonly known as:  ELIQUIS Take 2.5 mg by mouth 2 (two) times daily.   atorvastatin 80 MG tablet Commonly known as:  LIPITOR Take 1 tablet (80 mg total) by mouth daily with supper.   benazepril 10 MG tablet Commonly known as:  LOTENSIN Take 1 tablet (10 mg total) by mouth daily.   benazepril 20 MG tablet Commonly known as:  LOTENSIN Take 20 mg by mouth daily.   cyanocobalamin 500 MCG tablet Take 1,500 mcg by mouth daily with lunch. 3 tablets   donepezil 10 MG tablet Commonly known as:  ARICEPT Take 1 tablet (10 mg total) by mouth at bedtime.   feeding supplement (GLUCERNA SHAKE) Liqd Take 237 mLs by mouth 2 (two) times daily between meals.   HUMALOG KWIKPEN 100 UNIT/ML KiwkPen Generic drug:  insulin lispro Inject 5 Units into the skin 3 (three) times daily before meals. When CBG is greater than 200; inject 8 units with a CBG greater than 300   TOUJEO SOLOSTAR 300 UNIT/ML Sopn Generic drug:  Insulin Glargine Inject 4 Units into the skin at bedtime.   WOMENS MULTI VITAMIN & MINERAL PO Take  1 tablet by mouth daily with lunch.       No orders of the defined types were placed in this encounter.   Immunization History  Administered Date(s) Administered  . Influenza, High Dose Seasonal PF 12/30/2015  . PPD Test 04/26/2015, 08/02/2015, 02/23/2016    Social History  Substance Use Topics  . Smoking status: Passive Smoke Exposure - Never Smoker  . Smokeless tobacco: Never Used     Comment: pt's spouse was heavy smoker, smoked in home.   . Alcohol use No    Review of Systems  DATA OBTAINED: from patient, nurse GENERAL:  no fevers, fatigue, appetite changes SKIN: No itching, rash HEENT: No complaint RESPIRATORY: No cough, wheezing, SOB CARDIAC: No chest pain, palpitations, lower  extremity edema  GI: No abdominal pain, No N/V/D or constipation, No heartburn or reflux  GU: No dysuria, frequency or urgency, or incontinence  MUSCULOSKELETAL: No unrelieved bone/joint pain NEUROLOGIC: No headache, dizziness  PSYCHIATRIC: No overt anxiety or sadness  Vitals:   06/06/16 1054  BP: (!) 154/67  Pulse: 60  Resp: 18  Temp: 97.9 F (36.6 C)   Body mass index is 26.44 kg/m. Physical Exam  GENERAL APPEARANCE: Alert,  No acute distress  SKIN: No diaphoresis rash HEENT: Unremarkable RESPIRATORY: Breathing is even, unlabored. Lung sounds are clear   CARDIOVASCULAR: Heart RRR no murmurs, rubs or gallops. No peripheral edema  GASTROINTESTINAL: Abdomen is soft, non-tender, not distended w/ normal bowel sounds.  GENITOURINARY: Bladder non tender, not distended  MUSCULOSKELETAL: No abnormal joints or musculature NEUROLOGIC: Cranial nerves 2-12 grossly intact. Moves all extremities with mild RUE weakness PSYCHIATRIC: Mood and affect appropriate to situation with dementia, no behavioral issues  Patient Active Problem List   Diagnosis Date Noted  . H/O colon cancer, stage I 07/03/2016  . Lung mass 04/21/2016  . Degenerative cervical spinal stenosis 03/01/2016  . Spinal cord compression due to degenerative disorder of spinal column ([place]) 03/01/2016  . Hypokalemia 03/01/2016  . Myelopathy ([place])   . Generalized weakness 02/19/2016  . Diaphragmatic injury as surgical complication 72/53/6644  . Postoperative anemia due to acute blood loss 08/14/2015  . Dementia without behavioral disturbance 08/14/2015  . Chronic UTI 07/29/2015  . 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 Klebsiella sepsis ([place]) 05/02/2015  . CAP (community acquired pneumonia) 05/02/2015  . Transaminasemia   . Sepsis ([place]) 04/22/2015  . Paroxysmal atrial fibrillation ([place]) 04/22/2015  . Delirium   . Urinary tract infectious disease   . Osteoarthritis 03/27/2015  . Cerebrovascular accident (CVA)  due to thrombosis of cerebral artery ([place]) 03/23/2015  . Moderate tricuspid regurgitation 03/11/2015  . Cardiomyopathy ([place]), EF 40-45%  02/2015 03/11/2015  . Mild mitral regurgitation 03/11/2015  . Pulmonary arterial hypertension ([place]), mild 03/11/2015  . Mild cognitive impairment 02/24/2015  . Type 2 diabetes mellitus without complication, with long-term current use of insulin ([place]) 02/24/2015  . Hypertensive heart disease with CHF (congestive heart failure) ([place]) 02/24/2015  . Diabetes ([place]) 02/21/2015  . Essential hypertension, benign 02/21/2015  . Hyperlipidemia 02/19/2015  . Gout 02/19/2015  . PAD (peripheral artery disease) ([place]) 02/19/2015  . Cardiac pacemaker in situ 02/19/2015  . CKD (chronic kidney disease) 02/19/2015    CMP     Component Value Date/Time   NA 141 04/03/2016   K 4.7 04/03/2016   CL 101 02/23/2016 0605   CO2 33 (H) 02/23/2016 0605   GLUCOSE 139 (H) 02/23/2016 0605   BUN 24 (A) 04/03/2016   CREATININE 1.1 04/03/2016   CREATININE  1.12 (H) 02/23/2016 0605   CALCIUM 8.8 (L) 02/23/2016 0605   PROT 7.0 06/21/2015 1317   ALBUMIN 3.3 (L) 06/21/2015 1317   AST 39 06/21/2015 1317   ALT 47 06/21/2015 1317   ALKPHOS 110 06/21/2015 1317   BILITOT 0.4 06/21/2015 1317   GFRNONAA 44 (L) 02/23/2016 0605   GFRAA 52 (L) 02/23/2016 0605    Recent Labs  02/19/16 1318 02/19/16 1633 02/21/16 0449  02/23/16 0605 03/27/16 04/03/16  NA 136  --  137  < > 138 141 141  K 3.2*  --  3.3*  --  4.1 4.2 4.7  CL 97*  --  98*  --  101  --   --   CO2 30  --  32  --  33*  --   --   GLUCOSE 183*  --  132*  --  139*  --   --   BUN 26*  --  21*  < > 19 19 24*  CREATININE 1.21*  --  1.12*  < > 1.12* 0.9 1.1  CALCIUM 9.1  --  9.0  --  8.8*  --   --   MG  --  1.8  --   --   --   --   --   < > = values in this interval not displayed. No results for input(s): AST, ALT, ALKPHOS, BILITOT, PROT, ALBUMIN in the last 8760 hours.  Recent Labs  08/01/15 0926 02/19/16 1318   02/23/16 0605 03/27/16 04/03/16  WBC 14.6* 8.6  < > 7.4 8.8 8.8  NEUTROABS 13.3*  --   --   --   --   --   HGB 11.3* 14.2  < > 12.3 13.9 12.8  HCT 33.2* 41.7  < > 36.7 42 41  MCV 91.2 90.7  --  92.0  --   --   PLT 190 218  < > 215 223 228  < > = values in this interval not displayed.  Recent Labs  02/20/16 0440 04/03/16  CHOL 222* 234*  LDLCALC 133* 135  TRIG 74 96   No results found for: [facility] Lab Results  Component Value Date   TSH 1.16 04/03/2016   Lab Results  Component Value Date   HGBA1C 8.4 04/03/2016   Lab Results  Component Value Date   CHOL 234 (A) 04/03/2016   HDL 80 (A) 04/03/2016   LDLCALC 135 04/03/2016   TRIG 96 04/03/2016   CHOLHDL 3.0 02/20/2016    Significant Diagnostic Results in last 30 days:  No results found.  Assessment and Plan  Paroxysmal atrial fibrillation (HCC) Stable; rate controlled on amiodarone and prophylaxed with eliquis 2.5 mg BID  Hyperlipidemia Recent LDL 135, HDL 80 which is protective;plan to cont lipitor 80 mg daily  Gout No re[ported probems;plan to cont allopurinol 300 mg daily     Quanasia Defino D. Sheppard Coil, MD

## 2016-06-21 ENCOUNTER — Telehealth: Payer: Self-pay | Admitting: Cardiology

## 2016-06-21 ENCOUNTER — Encounter: Payer: Medicare Other | Admitting: *Deleted

## 2016-06-21 NOTE — Telephone Encounter (Signed)
LMOVM reminding pt to send remote transmission.   

## 2016-06-22 ENCOUNTER — Encounter: Payer: Self-pay | Admitting: Cardiology

## 2016-06-29 ENCOUNTER — Non-Acute Institutional Stay (SKILLED_NURSING_FACILITY): Payer: Medicare Other | Admitting: Internal Medicine

## 2016-06-29 ENCOUNTER — Encounter: Payer: Self-pay | Admitting: Internal Medicine

## 2016-06-29 DIAGNOSIS — E538 Deficiency of other specified B group vitamins: Secondary | ICD-10-CM

## 2016-06-29 DIAGNOSIS — I5022 Chronic systolic (congestive) heart failure: Secondary | ICD-10-CM | POA: Diagnosis not present

## 2016-06-29 DIAGNOSIS — I110 Hypertensive heart disease with heart failure: Secondary | ICD-10-CM | POA: Diagnosis not present

## 2016-06-29 DIAGNOSIS — I739 Peripheral vascular disease, unspecified: Secondary | ICD-10-CM

## 2016-06-29 NOTE — Progress Notes (Signed)
Location:  [facility] Room Number: [phone] Place of Service:  SNF [phone])  Allison Delaine. Sheppard Coil, MD  Patient Care Team: Binnie Rail, MD as PCP - General (Internal Medicine) Jola Schmidt, MD as Consulting Physician (Ophthalmology) Delice Lesch Lezlie Octave, MD as Consulting Physician (Neurology) Evans Lance, MD as Consulting Physician (Cardiology)  Extended Emergency Contact Information Primary Emergency Contact: [name],[name] Address: [address]          [place], [place] [address] [name] of [place] Phone: [phone] Mobile Phone: [phone] Relation: Sister    Allergies: Patient has no known allergies.  Chief Complaint  Patient presents with  . Medical Management of Chronic Issues    Routine Visit    HPI: Patient is 83 y.o. female who is being seen for routine issues of B12 deficiency, HTN and PAD.  Past Medical History:  Diagnosis Date  . Arthritis   . Cancer [facility])    breast, left; s/p lumpectomy/chemo/radiation  . Depression   . Diabetes mellitus without complication ([place])   . Hyperlipidemia   . Hypertension   . Paroxysmal atrial fibrillation (HCC)   . Presence of permanent cardiac pacemaker   . Renal disorder     Past Surgical History:  Procedure Laterality Date  . BREAST LUMPECTOMY Left   . CARDIAC PACEMAKER PLACEMENT    . CATARACT EXTRACTION    . EYE SURGERY    . kidney stone removal     x3  . LAPAROSCOPIC NEPHRECTOMY Left 07/29/2015   Procedure: LEFT LAPAROSCOPIC SIMPLE NEPHRECTOMY;  Surgeon: Ardis Hughs, MD;  Location: [facility];  Service: Urology;  Laterality: Left;  . LUMBAR SPINE SURGERY     laminectomy and rod    Allergies as of 06/29/2016   No Known Allergies     Medication List       Accurate as of 06/29/16 11:59 PM. Always use your most recent med list.          allopurinol 300 MG tablet Commonly known as:  ZYLOPRIM TAKE ONE TABLET BY MOUTH ONCE DAILY   amiodarone 200 MG tablet Commonly  known as:  PACERONE Take 1 tablet (200 mg total) by mouth daily.   amLODipine 5 MG tablet Commonly known as:  NORVASC Take 1 tablet (5 mg total) by mouth daily.   apixaban 2.5 MG Tabs tablet Commonly known as:  ELIQUIS Take 2.5 mg by mouth 2 (two) times daily.   atorvastatin 80 MG tablet Commonly known as:  LIPITOR Take 1 tablet (80 mg total) by mouth daily with supper.   benazepril 10 MG tablet Commonly known as:  LOTENSIN Take 1 tablet (10 mg total) by mouth daily.   benazepril 20 MG tablet Commonly known as:  LOTENSIN Take 20 mg by mouth daily.   cyanocobalamin 500 MCG tablet Take 1,500 mcg by mouth daily with lunch. 3 tablets   donepezil 10 MG tablet Commonly known as:  ARICEPT Take 1 tablet (10 mg total) by mouth at bedtime.   feeding supplement (GLUCERNA SHAKE) Liqd Take 237 mLs by mouth 2 (two) times daily between meals.   HUMALOG KWIKPEN 100 UNIT/ML KiwkPen Generic drug:  insulin lispro Inject 5 Units into the skin 3 (three) times daily before meals. When CBG is greater than 200; inject 8 units with a CBG greater than 300   TOUJEO SOLOSTAR 300 UNIT/ML Sopn Generic drug:  Insulin Glargine Inject 4 Units into the skin at bedtime.   WOMENS MULTI VITAMIN & MINERAL PO  Take 1 tablet by mouth daily with lunch.       No orders of the defined types were placed in this encounter.   Immunization History  Administered Date(s) Administered  . Influenza, High Dose Seasonal PF 12/30/2015  . PPD Test 04/26/2015, 08/02/2015, 02/23/2016    Social History  Substance Use Topics  . Smoking status: Passive Smoke Exposure - Never Smoker  . Smokeless tobacco: Never Used     Comment: pt's spouse was heavy smoker, smoked in home.   . Alcohol use No    Review of Systems  DATA OBTAINED: from patient, nurse GENERAL:  no fevers, fatigue, appetite changes SKIN: No itching, rash HEENT: No complaint RESPIRATORY: No cough, wheezing, SOB CARDIAC: No chest pain,  palpitations, lower extremity edema  GI: No abdominal pain, No N/V/D or constipation, No heartburn or reflux  GU: No dysuria, frequency or urgency, or incontinence  MUSCULOSKELETAL: No unrelieved bone/joint pain NEUROLOGIC: No headache, dizziness  PSYCHIATRIC: No overt anxiety or sadness  Vitals:   06/29/16 1016  BP: (!) 154/67  Pulse: 60  Resp: 18  Temp: 97.9 F (36.6 C)   Body mass index is 27.7 kg/m. Physical Exam  GENERAL APPEARANCE: Alert,  No acute distress  SKIN: No diaphoresis rash HEENT: Unremarkable RESPIRATORY: Breathing is even, unlabored. Lung sounds are clear   CARDIOVASCULAR: Heart RRR no murmurs, rubs or gallops. No peripheral edema  GASTROINTESTINAL: Abdomen is soft, non-tender, not distended w/ normal bowel sounds.  GENITOURINARY: Bladder non tender, not distended  MUSCULOSKELETAL: No abnormal joints or musculature NEUROLOGIC: Cranial nerves 2-12 grossly intact. Mild RUE weakness PSYCHIATRIC: Mood and affect with dementia, no behavioral issues  Patient Active Problem List   Diagnosis Date Noted  . Vitamin B12 deficiency 07/30/2016  . H/O colon cancer, stage I 07/03/2016  . Lung mass 04/21/2016  . Degenerative cervical spinal stenosis 03/01/2016  . Spinal cord compression due to degenerative disorder of spinal column ([place]) 03/01/2016  . Hypokalemia 03/01/2016  . Myelopathy ([place])   . Generalized weakness 02/19/2016  . Diaphragmatic injury as surgical complication 02/58/5277  . Postoperative anemia due to acute blood loss 08/14/2015  . Dementia without behavioral disturbance 08/14/2015  . Chronic UTI 07/29/2015  . 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 Klebsiella sepsis ([place]) 05/02/2015  . CAP (community acquired pneumonia) 05/02/2015  . Transaminasemia   . Sepsis ([place]) 04/22/2015  . Paroxysmal atrial fibrillation ([place]) 04/22/2015  . Delirium   . Urinary tract infectious disease   . Osteoarthritis 03/27/2015  . Cerebrovascular accident  (CVA) due to thrombosis of cerebral artery ([place]) 03/23/2015  . Moderate tricuspid regurgitation 03/11/2015  . Cardiomyopathy ([place]), EF 40-45%  02/2015 03/11/2015  . Mild mitral regurgitation 03/11/2015  . Pulmonary arterial hypertension ([place]), mild 03/11/2015  . Mild cognitive impairment 02/24/2015  . Type 2 diabetes mellitus without complication, with long-term current use of insulin ([place]) 02/24/2015  . Hypertensive heart disease with CHF (congestive heart failure) ([place]) 02/24/2015  . Diabetes ([place]) 02/21/2015  . Essential hypertension, benign 02/21/2015  . Hyperlipidemia 02/19/2015  . Gout 02/19/2015  . PAD (peripheral artery disease) ([place]) 02/19/2015  . Cardiac pacemaker in situ 02/19/2015  . CKD (chronic kidney disease) 02/19/2015    CMP     Component Value Date/Time   NA 142 07/10/2016   K 4.1 07/10/2016   CL 101 02/23/2016 0605   CO2 33 (H) 02/23/2016 0605   GLUCOSE 139 (H) 02/23/2016 0605   BUN 23 (A) 07/10/2016   CREATININE 1.2 (A) 07/10/2016  CREATININE 1.12 (H) 02/23/2016 0605   CALCIUM 8.8 (L) 02/23/2016 0605   PROT 7.0 06/21/2015 1317   ALBUMIN 3.3 (L) 06/21/2015 1317   AST 39 06/21/2015 1317   ALT 47 06/21/2015 1317   ALKPHOS 110 06/21/2015 1317   BILITOT 0.4 06/21/2015 1317   GFRNONAA 44 (L) 02/23/2016 0605   GFRAA 52 (L) 02/23/2016 0605    Recent Labs  02/19/16 1318 02/19/16 1633 02/21/16 0449  02/23/16 0605 03/27/16 04/03/16 07/10/16  NA 136  --  137  < > 138 141 141 142  K 3.2*  --  3.3*  --  4.1 4.2 4.7 4.1  CL 97*  --  98*  --  101  --   --   --   CO2 30  --  32  --  33*  --   --   --   GLUCOSE 183*  --  132*  --  139*  --   --   --   BUN 26*  --  21*  < > 19 19 24* 23*  CREATININE 1.21*  --  1.12*  < > 1.12* 0.9 1.1 1.2*  CALCIUM 9.1  --  9.0  --  8.8*  --   --   --   MG  --  1.8  --   --   --   --   --   --   < > = values in this interval not displayed. No results for input(s): AST, ALT, ALKPHOS, BILITOT, PROT, ALBUMIN in the last 8760  hours.  Recent Labs  08/01/15 0926 02/19/16 1318  02/23/16 0605 03/27/16 04/03/16  WBC 14.6* 8.6  < > 7.4 8.8 8.8  NEUTROABS 13.3*  --   --   --   --   --   HGB 11.3* 14.2  < > 12.3 13.9 12.8  HCT 33.2* 41.7  < > 36.7 42 41  MCV 91.2 90.7  --  92.0  --   --   PLT 190 218  < > 215 223 228  < > = values in this interval not displayed.  Recent Labs  02/20/16 0440 04/03/16  CHOL 222* 234*  LDLCALC 133* 135  TRIG 74 96   No results found for: [facility] Lab Results  Component Value Date   TSH 1.16 04/03/2016   Lab Results  Component Value Date   HGBA1C 8.4 04/03/2016   Lab Results  Component Value Date   CHOL 234 (A) 04/03/2016   HDL 80 (A) 04/03/2016   LDLCALC 135 04/03/2016   TRIG 96 04/03/2016   CHOLHDL 3.0 02/20/2016    Significant Diagnostic Results in last 30 days:  Dg Chest 2 View  Result Date: 07/26/2016 CLINICAL DATA:  Follow-up lung mass EXAM: CHEST  2 VIEW COMPARISON:  04/04/2016, 02/19/2016 FINDINGS: Persistent and enlarged right hilar mass lesion is noted. Some postobstructive changes are noted extending anteriorly similar to that seen on prior exam. Diffuse interstitial changes are again noted. Few scattered nodular densities are noted in the lung bases bilaterally. These were not well appreciated on the prior exam. No sizable effusion is noted. No bony abnormality is seen. Pacing device is again noted. IMPRESSION: Persistent somewhat enlarged right hilar mass. There is are some vague nodular densities in the bases bilaterally. Further evaluation is recommended to assess for progressive disease. These results will be called to the ordering clinician or representative by the Radiologist Assistant, and communication documented in the PACS or zVision Dashboard. Electronically Signed  By: Inez Catalina M.D.   On: 07/26/2016 13:43    Assessment and Plan  Vitamin B12 deficiency Most recent level was > 2000; plan to d/c B12 supplements  Essential hypertension,  benign Reasonably well controlled;plan to inc norvasc to 10 mg daily   Hypertensive heart disease with CHF (congestive heart failure) ([place]) Not wellcontrolled ;will inc norvasc to 10 mg daily  PAD (peripheral artery disease) ([place]) No known skin breakdowns; plan to cont eliquis for AF and monitor skin    Earla Charlie D. Sheppard Coil, MD

## 2016-07-03 ENCOUNTER — Non-Acute Institutional Stay (SKILLED_NURSING_FACILITY): Payer: Medicare Other | Admitting: Internal Medicine

## 2016-07-03 ENCOUNTER — Encounter: Payer: Self-pay | Admitting: Internal Medicine

## 2016-07-03 DIAGNOSIS — Z85038 Personal history of other malignant neoplasm of large intestine: Secondary | ICD-10-CM | POA: Insufficient documentation

## 2016-07-03 DIAGNOSIS — R600 Localized edema: Secondary | ICD-10-CM

## 2016-07-03 HISTORY — DX: Personal history of other malignant neoplasm of large intestine: Z85.038

## 2016-07-03 NOTE — Progress Notes (Signed)
Location:  [facility] Room Number: [phone] Place of Service:  SNF [phone])  Allison Dunn. Allison Coil, MD  Patient Care Team: Binnie Rail, MD as PCP - General (Internal Medicine) Jola Schmidt, MD as Consulting Physician (Ophthalmology) Delice Lesch Lezlie Octave, MD as Consulting Physician (Neurology) Evans Lance, MD as Consulting Physician (Cardiology)  Extended Emergency Contact Information Primary Emergency Contact: [name],[name] Address: [address]          [place], [place] [address] [name] of [place] Phone: [phone] Mobile Phone: [phone] Relation: Sister    Allergies: Patient has no known allergies.  Chief Complaint  Patient presents with  . Acute Visit    Acute    HPI: Patient is 83 y.o. female who is being seen for pedal edema for several days. Pt denies CP or SOB.  Past Medical History:  Diagnosis Date  . Arthritis   . Cancer [facility])    breast, left; s/p lumpectomy/chemo/radiation  . Depression   . Diabetes mellitus without complication ([place])   . Hyperlipidemia   . Hypertension   . Paroxysmal atrial fibrillation (HCC)   . Presence of permanent cardiac pacemaker   . Renal disorder     Past Surgical History:  Procedure Laterality Date  . BREAST LUMPECTOMY Left   . CARDIAC PACEMAKER PLACEMENT    . CATARACT EXTRACTION    . EYE SURGERY    . kidney stone removal     x3  . LAPAROSCOPIC NEPHRECTOMY Left 07/29/2015   Procedure: LEFT LAPAROSCOPIC SIMPLE NEPHRECTOMY;  Surgeon: Ardis Hughs, MD;  Location: [facility]S;  Service: Urology;  Laterality: Left;  . LUMBAR SPINE SURGERY     laminectomy and rod    Allergies as of 07/03/2016   No Known Allergies     Medication List       Accurate as of 07/03/16  3:44 PM. Always use your most recent med list.          allopurinol 300 MG tablet Commonly known as:  ZYLOPRIM TAKE ONE TABLET BY MOUTH ONCE DAILY   amiodarone 200 MG tablet Commonly known as:  PACERONE Take 1  tablet (200 mg total) by mouth daily.   amLODipine 5 MG tablet Commonly known as:  NORVASC Take 1 tablet (5 mg total) by mouth daily.   apixaban 2.5 MG Tabs tablet Commonly known as:  ELIQUIS Take 2.5 mg by mouth 2 (two) times daily.   atorvastatin 80 MG tablet Commonly known as:  LIPITOR Take 1 tablet (80 mg total) by mouth daily with supper.   benazepril 10 MG tablet Commonly known as:  LOTENSIN Take 1 tablet (10 mg total) by mouth daily.   benazepril 20 MG tablet Commonly known as:  LOTENSIN Take 20 mg by mouth daily.   cyanocobalamin 500 MCG tablet Take 1,500 mcg by mouth daily with lunch. 3 tablets   donepezil 10 MG tablet Commonly known as:  ARICEPT Take 1 tablet (10 mg total) by mouth at bedtime.   feeding supplement (GLUCERNA SHAKE) Liqd Take 237 mLs by mouth 2 (two) times daily between meals.   HUMALOG KWIKPEN 100 UNIT/ML KiwkPen Generic drug:  insulin lispro Inject 5 Units into the skin 3 (three) times daily before meals. When CBG is greater than 200; inject 8 units with a CBG greater than 300   TOUJEO SOLOSTAR 300 UNIT/ML Sopn Generic drug:  Insulin Glargine Inject 4 Units into the skin at bedtime.   WOMENS MULTI VITAMIN & MINERAL PO Take  1 tablet by mouth daily with lunch.       No orders of the defined types were placed in this encounter.   Immunization History  Administered Date(s) Administered  . Influenza, High Dose Seasonal PF 12/30/2015  . PPD Test 04/26/2015, 08/02/2015, 02/23/2016    Social History  Substance Use Topics  . Smoking status: Passive Smoke Exposure - Never Smoker  . Smokeless tobacco: Never Used     Comment: pt's spouse was heavy smoker, smoked in home.   . Alcohol use No    Review of Systems  DATA OBTAINED: from patient, nurse GENERAL:  no fevers, fatigue, appetite changes SKIN: No itching, rash HEENT: No complaint RESPIRATORY: No cough, wheezing, SOB CARDIAC: No chest pain, palpitations, lower extremity edema    GI: No abdominal pain, No N/V/D or constipation, No heartburn or reflux  GU: No dysuria, frequency or urgency, or incontinence  MUSCULOSKELETAL: No unrelieved bone/joint pain NEUROLOGIC: No headache, dizziness  PSYCHIATRIC: No overt anxiety or sadness  Vitals:   07/03/16 1539  BP: (!) 154/67  Pulse: 60  Resp: 18  Temp: 97.9 F (36.6 C)   Body mass index is 28.82 kg/m. Physical Exam  GENERAL APPEARANCE: Alert, conversant, No acute distress  SKIN: No diaphoresis rash HEENT: Unremarkable RESPIRATORY: Breathing is even, unlabored. Lung sounds are clear   CARDIOVASCULAR: Heart RRR no murmurs, rubs or gallops. No peripheral edema  GASTROINTESTINAL: Abdomen is soft, non-tender, not distended w/ normal bowel sounds.  GENITOURINARY: Bladder non tender, not distended  MUSCULOSKELETAL: No abnormal joints or musculature NEUROLOGIC: Cranial nerves 2-12 grossly intact; mild RUE weakness PSYCHIATRIC: Mood and affect appropriate to situation with dementia, no behavioral issues  Patient Active Problem List   Diagnosis Date Noted  . H/O colon cancer, stage I 07/03/2016  . Lung mass 04/21/2016  . Degenerative cervical spinal stenosis 03/01/2016  . Spinal cord compression due to degenerative disorder of spinal column ([place]) 03/01/2016  . Hypokalemia 03/01/2016  . Myelopathy ([place])   . Generalized weakness 02/19/2016  . Diaphragmatic injury as surgical complication 16/11/9602  . Postoperative anemia due to acute blood loss 08/14/2015  . Dementia without behavioral disturbance 08/14/2015  . Chronic UTI 07/29/2015  . 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 Klebsiella sepsis ([place]) 05/02/2015  . CAP (community acquired pneumonia) 05/02/2015  . Transaminasemia   . Sepsis ([place]) 04/22/2015  . Paroxysmal atrial fibrillation ([place]) 04/22/2015  . Delirium   . Urinary tract infectious disease   . Osteoarthritis 03/27/2015  . Cerebrovascular accident (CVA) due to thrombosis of cerebral  artery ([place]) 03/23/2015  . Moderate tricuspid regurgitation 03/11/2015  . Cardiomyopathy ([place]), EF 40-45%  02/2015 03/11/2015  . Mild mitral regurgitation 03/11/2015  . Pulmonary arterial hypertension ([place]), mild 03/11/2015  . Mild cognitive impairment 02/24/2015  . Type 2 diabetes mellitus without complication, with long-term current use of insulin ([place]) 02/24/2015  . Hypertensive heart disease with CHF (congestive heart failure) ([place]) 02/24/2015  . Diabetes ([place]) 02/21/2015  . Essential hypertension, benign 02/21/2015  . Hyperlipidemia 02/19/2015  . Gout 02/19/2015  . PAD (peripheral artery disease) ([place]) 02/19/2015  . Cardiac pacemaker in situ 02/19/2015  . CKD (chronic kidney disease) 02/19/2015    CMP     Component Value Date/Time   NA 141 04/03/2016   K 4.7 04/03/2016   CL 101 02/23/2016 0605   CO2 33 (H) 02/23/2016 0605   GLUCOSE 139 (H) 02/23/2016 0605   BUN 24 (A) 04/03/2016   CREATININE 1.1 04/03/2016   CREATININE 1.12 (H) 02/23/2016  6237   CALCIUM 8.8 (L) 02/23/2016 0605   PROT 7.0 06/21/2015 1317   ALBUMIN 3.3 (L) 06/21/2015 1317   AST 39 06/21/2015 1317   ALT 47 06/21/2015 1317   ALKPHOS 110 06/21/2015 1317   BILITOT 0.4 06/21/2015 1317   GFRNONAA 44 (L) 02/23/2016 0605   GFRAA 52 (L) 02/23/2016 0605    Recent Labs  02/19/16 1318 02/19/16 1633 02/21/16 0449  02/23/16 0605 03/27/16 04/03/16  NA 136  --  137  < > 138 141 141  K 3.2*  --  3.3*  --  4.1 4.2 4.7  CL 97*  --  98*  --  101  --   --   CO2 30  --  32  --  33*  --   --   GLUCOSE 183*  --  132*  --  139*  --   --   BUN 26*  --  21*  < > 19 19 24*  CREATININE 1.21*  --  1.12*  < > 1.12* 0.9 1.1  CALCIUM 9.1  --  9.0  --  8.8*  --   --   MG  --  1.8  --   --   --   --   --   < > = values in this interval not displayed. No results for input(s): AST, ALT, ALKPHOS, BILITOT, PROT, ALBUMIN in the last 8760 hours.  Recent Labs  08/01/15 0926 02/19/16 1318  02/23/16 0605 03/27/16 04/03/16  WBC  14.6* 8.6  < > 7.4 8.8 8.8  NEUTROABS 13.3*  --   --   --   --   --   HGB 11.3* 14.2  < > 12.3 13.9 12.8  HCT 33.2* 41.7  < > 36.7 42 41  MCV 91.2 90.7  --  92.0  --   --   PLT 190 218  < > 215 223 228  < > = values in this interval not displayed.  Recent Labs  02/20/16 0440 04/03/16  CHOL 222* 234*  LDLCALC 133* 135  TRIG 74 96   No results found for: [facility] Lab Results  Component Value Date   TSH 1.16 04/03/2016   Lab Results  Component Value Date   HGBA1C 8.4 04/03/2016   Lab Results  Component Value Date   CHOL 234 (A) 04/03/2016   HDL 80 (A) 04/03/2016   LDLCALC 135 04/03/2016   TRIG 96 04/03/2016   CHOLHDL 3.0 02/20/2016    Significant Diagnostic Results in last 30 days:  No results found.  Assessment and Plan  BLE EDEMA - HAVE WRITTEN FOR ; f/u BMP on Monday and also TED hoseLASIX 20 MG DAILY FOR 7 DAYS    Anne D. Allison Coil, MD

## 2016-07-08 ENCOUNTER — Encounter: Payer: Self-pay | Admitting: Internal Medicine

## 2016-07-08 NOTE — Assessment & Plan Note (Signed)
Stable; rate controlled on amiodarone and prophylaxed with eliquis 2.5 mg BID

## 2016-07-08 NOTE — Assessment & Plan Note (Signed)
No re[ported probems;plan to cont allopurinol 300 mg daily

## 2016-07-08 NOTE — Assessment & Plan Note (Signed)
Recent LDL 135, HDL 80 which is protective;plan to cont lipitor 80 mg daily

## 2016-07-10 DIAGNOSIS — D649 Anemia, unspecified: Secondary | ICD-10-CM | POA: Diagnosis not present

## 2016-07-10 DIAGNOSIS — I10 Essential (primary) hypertension: Secondary | ICD-10-CM | POA: Diagnosis not present

## 2016-07-10 LAB — BASIC METABOLIC PANEL
BUN: 23 mg/dL — AB (ref 4–21)
CREATININE: 1.2 mg/dL — AB (ref 0.5–1.1)
GLUCOSE: 183 mg/dL
POTASSIUM: 4.1 mmol/L (ref 3.4–5.3)
SODIUM: 142 mmol/L (ref 137–147)

## 2016-07-26 ENCOUNTER — Ambulatory Visit (INDEPENDENT_AMBULATORY_CARE_PROVIDER_SITE_OTHER): Payer: Medicare Other | Admitting: Pulmonary Disease

## 2016-07-26 ENCOUNTER — Encounter: Payer: Self-pay | Admitting: Pulmonary Disease

## 2016-07-26 ENCOUNTER — Ambulatory Visit (INDEPENDENT_AMBULATORY_CARE_PROVIDER_SITE_OTHER)
Admission: RE | Admit: 2016-07-26 | Discharge: 2016-07-26 | Disposition: A | Discharge status: Home / Self Care | Payer: Medicare Other | Source: Ambulatory Visit | Attending: Pulmonary Disease | Admitting: Pulmonary Disease

## 2016-07-26 VITALS — BP 140/66 | HR 60 | Ht 60.0 in | Wt 131.6 lb

## 2016-07-26 DIAGNOSIS — R918 Other nonspecific abnormal finding of lung field: Secondary | ICD-10-CM

## 2016-07-26 NOTE — Progress Notes (Signed)
Subjective:    Patient ID: Allison Dunn, female    DOB: [birth year], 83 y.o.   MRN: [id number]  Synopsis: Has a history of breast cancer and was referred in 2018 for evaluation of a lung mass. Has dementia and significant deconditioning, refused biopsy.  HPI Chief Complaint  Patient presents with  . Follow-up    pt states she is doing well, only complaint is of a runny nose.     Mr. Holcomb has been walking around [facility] without any dyspnea.  No cough.   No chest pain or weight loss. Her sister provides the history today and says that she doesn't think she has had any cough or dyspnea.  She notes some post nasal drip.    Past Medical History:  Diagnosis Date  . Arthritis   . Cancer [facility])    breast, left; s/p lumpectomy/chemo/radiation  . Depression   . Diabetes mellitus without complication ([place])   . Hyperlipidemia   . Hypertension   . Paroxysmal atrial fibrillation (HCC)   . Presence of permanent cardiac pacemaker   . Renal disorder       Review of Systems     Objective:   Physical Exam Vitals:   07/26/16 1145  BP: 140/66  Pulse: 60  SpO2: 98%  Weight: 131 lb 9.6 oz (59.7 kg)  Height: 5' (1.524 m)    General:  Resting comfortably in chair HENT: NCAT OP clear PULM: Crackle R base B, normal effort CV: RRR, no mgr GI: BS+, soft, nontender MSK: normal bulk and tone Neuro: awake, alert, no distress, MAEW   ER records from a few weeks ago reviewed were she went in for right hand swelling, no DVT seen on vascular ultrasound     Assessment & Plan:  Lung mass She was noted to have a lung mass on a CT chest earlier this year. We talked about this at length with her and her sister and they did not want to perform a biopsy. He talked about the fact that we felt like it's likely malignant.  Today we will repeat a chest x-ray to see if there is been any evidence of progression. Otherwise, she will follow-up with [place] on an as-needed basis if any symptoms  arise.    Current Outpatient Prescriptions:  .  acetaminophen (TYLENOL) 325 MG tablet, Take 650 mg by mouth every 6 (six) hours as needed., Disp: , Rfl:  .  allopurinol (ZYLOPRIM) 300 MG tablet, TAKE ONE TABLET BY MOUTH ONCE DAILY, Disp: 90 tablet, Rfl: 0 .  amiodarone (PACERONE) 200 MG tablet, Take 1 tablet (200 mg total) by mouth daily., Disp: 90 tablet, Rfl: 1 .  amLODipine (NORVASC) 5 MG tablet, Take 1 tablet (5 mg total) by mouth daily., Disp: 90 tablet, Rfl: 3 .  apixaban (ELIQUIS) 2.5 MG TABS tablet, Take 2.5 mg by mouth 2 (two) times daily., Disp: , Rfl:  .  atorvastatin (LIPITOR) 80 MG tablet, Take 1 tablet (80 mg total) by mouth daily with supper., Disp: 90 tablet, Rfl: 1 .  benazepril (LOTENSIN) 20 MG tablet, Take 20 mg by mouth daily. , Disp: , Rfl:  .  cyanocobalamin 500 MCG tablet, Take 1,500 mcg by mouth daily with lunch. 3 tablets, Disp: , Rfl:  .  donepezil (ARICEPT) 10 MG tablet, Take 1 tablet (10 mg total) by mouth at bedtime., Disp: 90 tablet, Rfl: 3 .  feeding supplement, GLUCERNA SHAKE, (GLUCERNA SHAKE) LIQD, Take 237 mLs by mouth 2 (two) times daily between  meals., Disp: , Rfl:  .  HUMALOG KWIKPEN 100 UNIT/ML KiwkPen, Inject 5 Units into the skin 3 (three) times daily before meals. When CBG is greater than 200; inject 8 units with a CBG greater than 300, Disp: , Rfl:  .  Insulin Glargine (TOUJEO SOLOSTAR) 300 UNIT/ML SOPN, Inject 4 Units into the skin at bedtime., Disp: , Rfl:  .  Multiple Vitamins-Minerals (WOMENS MULTI VITAMIN & MINERAL PO), Take 1 tablet by mouth daily with lunch. , Disp: , Rfl:

## 2016-07-26 NOTE — Assessment & Plan Note (Signed)
She was noted to have a lung mass on a CT chest earlier this year. We talked about this at length with her and her sister and they did not want to perform a biopsy. He talked about the fact that we felt like it's likely malignant.  Today we will repeat a chest x-ray to see if there is been any evidence of progression. Otherwise, she will follow-up with [place] on an as-needed basis if any symptoms arise.

## 2016-07-26 NOTE — Patient Instructions (Signed)
We will arrange for a chest x-ray and then call you with those results Follow-up with me if you develop a breathing problem

## 2016-07-30 ENCOUNTER — Encounter: Payer: Self-pay | Admitting: Internal Medicine

## 2016-07-30 DIAGNOSIS — E538 Deficiency of other specified B group vitamins: Secondary | ICD-10-CM | POA: Insufficient documentation

## 2016-07-30 HISTORY — DX: Deficiency of other specified B group vitamins: E53.8

## 2016-07-30 NOTE — Assessment & Plan Note (Signed)
Not wellcontrolled ;will inc norvasc to 10 mg daily

## 2016-07-30 NOTE — Assessment & Plan Note (Signed)
No known skin breakdowns; plan to cont eliquis for AF and monitor skin

## 2016-07-30 NOTE — Assessment & Plan Note (Signed)
Most recent level was > 2000; plan to d/c B12 supplements

## 2016-07-30 NOTE — Assessment & Plan Note (Signed)
Reasonably well controlled;plan to inc norvasc to 10 mg daily

## 2016-07-31 ENCOUNTER — Encounter: Payer: Self-pay | Admitting: Internal Medicine

## 2016-08-01 ENCOUNTER — Non-Acute Institutional Stay (SKILLED_NURSING_FACILITY): Payer: Medicare Other | Admitting: Internal Medicine

## 2016-08-01 ENCOUNTER — Encounter: Payer: Self-pay | Admitting: Internal Medicine

## 2016-08-01 DIAGNOSIS — Z794 Long term (current) use of insulin: Secondary | ICD-10-CM | POA: Diagnosis not present

## 2016-08-01 DIAGNOSIS — I6330 Cerebral infarction due to thrombosis of unspecified cerebral artery: Secondary | ICD-10-CM | POA: Diagnosis not present

## 2016-08-01 DIAGNOSIS — E119 Type 2 diabetes mellitus without complications: Secondary | ICD-10-CM

## 2016-08-01 DIAGNOSIS — I255 Ischemic cardiomyopathy: Secondary | ICD-10-CM

## 2016-08-01 NOTE — Progress Notes (Signed)
Location:  [facility] Room Number: [phone] Place of Service:  SNF [phone])  Allison Dunn. Sheppard Coil, MD  Patient Care Team: Hennie Duos, MD as PCP - General (Internal Medicine) Jola Schmidt, MD as Consulting Physician (Ophthalmology) Delice Lesch Lezlie Octave, MD as Consulting Physician (Neurology) Evans Lance, MD as Consulting Physician (Cardiology) Juanito Doom, MD as Consulting Physician (Pulmonary Disease)  Extended Emergency Contact Information Primary Emergency Contact: [name],[name] Address: [address]          [place], [place] [address] [name] of [place] Phone: [phone] Mobile Phone: [phone] Relation: Sister    Allergies: Patient has no known allergies.  Chief Complaint  Patient presents with  . Medical Management of Chronic Issues    Routine Visit    HPI: Patient is 83 y.o. female who Who is being seen for routine issues of history of thrombotic stroke, ischemic cardiomyopathy, and DM 2.  Past Medical History:  Diagnosis Date  . Arthritis   . Cancer [facility])    breast, left; s/p lumpectomy/chemo/radiation  . CAP (community acquired pneumonia) 05/02/2015  . Cardiac pacemaker in situ 02/19/2015  . Cerebrovascular accident (CVA) due to thrombosis of cerebral artery ([place]) 03/23/2015  . CKD (chronic kidney disease) 02/19/2015  . Degenerative cervical spinal stenosis 03/01/2016  . Dementia without behavioral disturbance 08/14/2015  . Depression   . Diabetes mellitus without complication ([place])   . Gout 02/19/2015  . H/O colon cancer, stage I 07/03/2016   s/p resection 2013 - colonic mucosa with high-grade dysplasia/adenocarcinoma in situ  . Hyperlipidemia   . Hypertension   . Hypertensive heart disease with CHF (congestive heart failure) ([place]) 02/24/2015  . Hypokalemia 03/01/2016  . Lung mass 04/21/2016  . Mild cognitive impairment 02/24/2015  . Mild mitral regurgitation 03/11/2015  . Moderate tricuspid regurgitation  03/11/2015   02/2015   . Osteoarthritis 03/27/2015  . PAD (peripheral artery disease) ([place]) 02/19/2015   S/p stent in left leg   . Paroxysmal atrial fibrillation (HCC)   . Presence of permanent cardiac pacemaker   . Pulmonary arterial hypertension ([place]), mild 03/11/2015  . Renal disorder   . Spinal cord compression due to degenerative disorder of spinal column ([place]) 03/01/2016  . Transaminasemia   . Type 2 diabetes mellitus without complication, with long-term current use of insulin ([place]) 02/24/2015  . Vitamin B12 deficiency 07/30/2016  . 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 BREAST LUMPECTOMY Left   . CARDIAC PACEMAKER PLACEMENT    . CATARACT EXTRACTION    . EYE SURGERY    . kidney stone removal     x3  . LAPAROSCOPIC NEPHRECTOMY Left 07/29/2015   Procedure: LEFT LAPAROSCOPIC SIMPLE NEPHRECTOMY;  Surgeon: Ardis Hughs, MD;  Location: [facility]S;  Service: Urology;  Laterality: Left;  . LUMBAR SPINE SURGERY     laminectomy and rod    Allergies as of 08/01/2016   No Known Allergies     Medication List       Accurate as of 08/01/16 11:59 PM. Always use your most recent med list.          acetaminophen 325 MG tablet Commonly known as:  TYLENOL Take 650 mg by mouth every 6 (six) hours as needed.   allopurinol 300 MG tablet Commonly known as:  ZYLOPRIM TAKE ONE TABLET BY MOUTH ONCE DAILY   amiodarone 200 MG tablet Commonly known as:  PACERONE Take 1 tablet (200 mg total)  by mouth daily.   amLODipine 5 MG tablet Commonly known as:  NORVASC Take 1 tablet (5 mg total) by mouth daily.   apixaban 2.5 MG Tabs tablet Commonly known as:  ELIQUIS Take 2.5 mg by mouth 2 (two) times daily.   atorvastatin 80 MG tablet Commonly known as:  LIPITOR Take 1 tablet (80 mg total) by mouth daily with supper.   benazepril 20 MG tablet Commonly known as:  LOTENSIN Take 20 mg by mouth daily.   cyanocobalamin 500 MCG  tablet Take 1,500 mcg by mouth daily with lunch. 3 tablets   donepezil 10 MG tablet Commonly known as:  ARICEPT Take 1 tablet (10 mg total) by mouth at bedtime.   feeding supplement (GLUCERNA SHAKE) Liqd Take 237 mLs by mouth 2 (two) times daily between meals.   HUMALOG KWIKPEN 100 UNIT/ML KiwkPen Generic drug:  insulin lispro Inject 5 Units into the skin 3 (three) times daily before meals. When CBG is greater than 200; inject 8 units with a CBG greater than 300   TOUJEO SOLOSTAR 300 UNIT/ML Sopn Generic drug:  Insulin Glargine Inject 4 Units into the skin at bedtime.   WOMENS MULTI VITAMIN & MINERAL PO Take 1 tablet by mouth daily with lunch.       No orders of the defined types were placed in this encounter.   Immunization History  Administered Date(s) Administered  . Influenza, High Dose Seasonal PF 12/30/2015  . PPD Test 04/26/2015, 08/02/2015, 02/23/2016    Social History  Substance Use Topics  . Smoking status: Passive Smoke Exposure - Never Smoker  . Smokeless tobacco: Never Used     Comment: pt's spouse was heavy smoker, smoked in home.   . Alcohol use No    Review of Systems  DATA OBTAINED: from patient, nurse GENERAL:  no fevers, fatigue, appetite changes SKIN: No itching, rash HEENT: No complaint RESPIRATORY: No cough, wheezing, SOB CARDIAC: No chest pain, palpitations, lower extremity edema  GI: No abdominal pain, No N/V/D or constipation, No heartburn or reflux  GU: No dysuria, frequency or urgency, or incontinence  MUSCULOSKELETAL: No unrelieved bone/joint pain NEUROLOGIC: No headache, dizziness  PSYCHIATRIC: No overt anxiety or sadness  Vitals:   08/01/16 1103  BP: (!) 154/67  Pulse: 60  Resp: 18  Temp: 97.9 F (36.6 C)   Body mass index is 25.78 kg/m. Physical Exam  GENERAL APPEARANCE: Alert, conversant, No acute distress  SKIN: No diaphoresis rash HEENT: Unremarkable RESPIRATORY: Breathing is even, unlabored. Lung sounds are clear    CARDIOVASCULAR: Heart RRR no murmurs, rubs or gallops. No peripheral edema  GASTROINTESTINAL: Abdomen is soft, non-tender, not distended w/ normal bowel sounds.  GENITOURINARY: Bladder non tender, not distended  MUSCULOSKELETAL: No abnormal joints or musculature NEUROLOGIC: Cranial nerves 2-12 grossly intact;Mild weakness of right upper extremity PSYCHIATRIC: Mood and affect appropriate to situation with dementia, no behavioral issues  Patient Active Problem List   Diagnosis Date Noted  . Vitamin B12 deficiency 07/30/2016  . H/O colon cancer, stage I 07/03/2016  . Lung mass 04/21/2016  . Degenerative cervical spinal stenosis 03/01/2016  . Spinal cord compression due to degenerative disorder of spinal column ([place]) 03/01/2016  . Hypokalemia 03/01/2016  . Myelopathy ([place])   . Generalized weakness 02/19/2016  . Diaphragmatic injury as surgical complication 32/44/0102  . Postoperative anemia due to acute blood loss 08/14/2015  . Dementia without behavioral disturbance 08/14/2015  . Chronic UTI 07/29/2015  . 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 Klebsiella sepsis ([place]) 05/02/2015  .  CAP (community acquired pneumonia) 05/02/2015  . Transaminasemia   . Sepsis ([place]) 04/22/2015  . Paroxysmal atrial fibrillation ([place]) 04/22/2015  . Delirium   . Urinary tract infectious disease   . Osteoarthritis 03/27/2015  . Cerebrovascular accident (CVA) due to thrombosis of cerebral artery ([place]) 03/23/2015  . Moderate tricuspid regurgitation 03/11/2015  . Cardiomyopathy ([place]), EF 40-45%  02/2015 03/11/2015  . Mild mitral regurgitation 03/11/2015  . Pulmonary arterial hypertension ([place]), mild 03/11/2015  . Mild cognitive impairment 02/24/2015  . Type 2 diabetes mellitus without complication, with long-term current use of insulin ([place]) 02/24/2015  . Hypertensive heart disease with CHF (congestive heart failure) ([place]) 02/24/2015  . Diabetes ([place]) 02/21/2015  . Essential hypertension, benign  02/21/2015  . Hyperlipidemia 02/19/2015  . Gout 02/19/2015  . PAD (peripheral artery disease) ([place]) 02/19/2015  . Cardiac pacemaker in situ 02/19/2015  . CKD (chronic kidney disease) 02/19/2015    CMP     Component Value Date/Time   NA 142 07/10/2016   K 4.1 07/10/2016   CL 101 02/23/2016 0605   CO2 33 (H) 02/23/2016 0605   GLUCOSE 139 (H) 02/23/2016 0605   BUN 23 (A) 07/10/2016   CREATININE 1.2 (A) 07/10/2016   CREATININE 1.12 (H) 02/23/2016 0605   CALCIUM 8.8 (L) 02/23/2016 0605   PROT 7.0 06/21/2015 1317   ALBUMIN 3.3 (L) 06/21/2015 1317   AST 39 06/21/2015 1317   ALT 47 06/21/2015 1317   ALKPHOS 110 06/21/2015 1317   BILITOT 0.4 06/21/2015 1317   GFRNONAA 44 (L) 02/23/2016 0605   GFRAA 52 (L) 02/23/2016 0605    Recent Labs  02/19/16 1318 02/19/16 1633 02/21/16 0449  02/23/16 0605 03/27/16 04/03/16 07/10/16  NA 136  --  137  < > 138 141 141 142  K 3.2*  --  3.3*  --  4.1 4.2 4.7 4.1  CL 97*  --  98*  --  101  --   --   --   CO2 30  --  32  --  33*  --   --   --   GLUCOSE 183*  --  132*  --  139*  --   --   --   BUN 26*  --  21*  < > 19 19 24* 23*  CREATININE 1.21*  --  1.12*  < > 1.12* 0.9 1.1 1.2*  CALCIUM 9.1  --  9.0  --  8.8*  --   --   --   MG  --  1.8  --   --   --   --   --   --   < > = values in this interval not displayed. No results for input(s): AST, ALT, ALKPHOS, BILITOT, PROT, ALBUMIN in the last 8760 hours.  Recent Labs  02/19/16 1318  02/23/16 0605 03/27/16 04/03/16  WBC 8.6  < > 7.4 8.8 8.8  HGB 14.2  < > 12.3 13.9 12.8  HCT 41.7  < > 36.7 42 41  MCV 90.7  --  92.0  --   --   PLT 218  < > 215 223 228  < > = values in this interval not displayed.  Recent Labs  02/20/16 0440 04/03/16  CHOL 222* 234*  LDLCALC 133* 135  TRIG 74 96   No results found for: [facility] Lab Results  Component Value Date   TSH 1.16 04/03/2016   Lab Results  Component Value Date   HGBA1C 8.4 04/03/2016   Lab Results  Component Value Date   CHOL 234  (A) 04/03/2016   HDL 80 (A) 04/03/2016   LDLCALC 135 04/03/2016   TRIG 96 04/03/2016   CHOLHDL 3.0 02/20/2016    Significant Diagnostic Results in last 30 days:  No results found.  Assessment and Plan  Cerebrovascular accident (CVA) due to thrombosis of cerebral artery (HCC) Chronic and stable; plan to continue Eliquis 2.5 mg by mouth twice a day which covers for atrial fibrillation as well  Cardiomyopathy ([place]), EF 40-45%  02/2015 Stable without reported exacerbations; plan to continue losartan 20 mg by mouth daily; patient is not on a diuretic  Type 2 diabetes mellitus without complication, with long-term current use of insulin (HCC) Most recent recent hemoglobin A1c was 8.4 which is a little bit high even for age; will review patient's blood sugar log; in the meantime we'll continue Toujeo 4 units daily at bedtime and NovoLog 5 units with each meal; patient is on statin and patient is also on losartan    Allison Silversmith D. Sheppard Coil, MD

## 2016-08-04 ENCOUNTER — Encounter: Payer: Self-pay | Admitting: Internal Medicine

## 2016-08-04 ENCOUNTER — Non-Acute Institutional Stay (SKILLED_NURSING_FACILITY): Payer: Medicare Other | Admitting: Internal Medicine

## 2016-08-04 DIAGNOSIS — R42 Dizziness and giddiness: Secondary | ICD-10-CM

## 2016-08-04 NOTE — Progress Notes (Signed)
Location:  [facility] Room Number: [phone] Place of Service:  SNF [phone])  Allison Delaine. Sheppard Coil, Allison Dunn  Patient Care Team: Binnie Rail, Allison Dunn as PCP - General (Internal Medicine) Jola Schmidt, Allison Dunn as Consulting Physician (Ophthalmology) Delice Lesch Lezlie Octave, Allison Dunn as Consulting Physician (Neurology) Evans Lance, Allison Dunn as Consulting Physician (Cardiology)  Extended Emergency Contact Information Primary Emergency Contact: [name],[name] Address: [address]          [place], [place] [address] [name] of [place] Phone: [phone] Mobile Phone: [phone] Relation: Sister    Allergies: Patient has no known allergies.  Chief Complaint  Patient presents with  . Acute Visit    Acute    HPI: Patient is 83 y.o. female who nursing asked me to see for pt c/o dizzyness onset today. Pt admits that her head "feels funny", movement makes it worse; she denies that room is spinning but she does feel like things are off. Very quick onset. No tingling, numbness, focal weakness of MS changes. Pt has not has anything like this prior.  Past Medical History:  Diagnosis Date  . Arthritis   . Cancer [facility])    breast, left; s/p lumpectomy/chemo/radiation  . Depression   . Diabetes mellitus without complication ([place])   . Hyperlipidemia   . Hypertension   . Paroxysmal atrial fibrillation (HCC)   . Presence of permanent cardiac pacemaker   . Renal disorder     Past Surgical History:  Procedure Laterality Date  . BREAST LUMPECTOMY Left   . CARDIAC PACEMAKER PLACEMENT    . CATARACT EXTRACTION    . EYE SURGERY    . kidney stone removal     x3  . LAPAROSCOPIC NEPHRECTOMY Left 07/29/2015   Procedure: LEFT LAPAROSCOPIC SIMPLE NEPHRECTOMY;  Surgeon: Ardis Hughs, Allison Dunn;  [facility];  Service: Urology;  Laterality: Left;  . LUMBAR SPINE SURGERY     laminectomy and rod    Allergies as of 08/04/2016   No Known Allergies     Medication List       Accurate  as of 08/04/16  4:08 PM. Always use your most recent med list.          acetaminophen 325 MG tablet Commonly known as:  TYLENOL Take 650 mg by mouth every 6 (six) hours as needed.   allopurinol 300 MG tablet Commonly known as:  ZYLOPRIM TAKE ONE TABLET BY MOUTH ONCE DAILY   amiodarone 200 MG tablet Commonly known as:  PACERONE Take 1 tablet (200 mg total) by mouth daily.   amLODipine 10 MG tablet Commonly known as:  NORVASC Take 10 mg by mouth daily.   apixaban 2.5 MG Tabs tablet Commonly known as:  ELIQUIS Take 2.5 mg by mouth 2 (two) times daily.   atorvastatin 80 MG tablet Commonly known as:  LIPITOR Take 1 tablet (80 mg total) by mouth daily with supper.   benazepril 20 MG tablet Commonly known as:  LOTENSIN Take 20 mg by mouth daily.   cyanocobalamin 500 MCG tablet Take 1,500 mcg by mouth daily with lunch. 3 tablets   donepezil 10 MG tablet Commonly known as:  ARICEPT Take 1 tablet (10 mg total) by mouth at bedtime.   feeding supplement (GLUCERNA SHAKE) Liqd Take 237 mLs by mouth 2 (two) times daily between meals.   HUMALOG KWIKPEN 100 UNIT/ML KiwkPen Generic drug:  insulin lispro Inject 5 Units into the skin 3 (three) times daily before meals. When CBG is greater  than 200; inject 8 units with a CBG greater than 300   TOUJEO SOLOSTAR 300 UNIT/ML Sopn Generic drug:  Insulin Glargine Inject 4 Units into the skin at bedtime.   WOMENS MULTI VITAMIN & MINERAL PO Take 1 tablet by mouth daily with lunch.       Meds ordered this encounter  Medications  . amLODipine (NORVASC) 10 MG tablet    Sig: Take 10 mg by mouth daily.    Immunization History  Administered Date(s) Administered  . Influenza, High Dose Seasonal PF 12/30/2015  . PPD Test 04/26/2015, 08/02/2015, 02/23/2016    Social History  Substance Use Topics  . Smoking status: Passive Smoke Exposure - Never Smoker  . Smokeless tobacco: Never Used     Comment: pt's spouse was heavy smoker, smoked  in home.   . Alcohol use No    Review of Systems  DATA OBTAINED: from patient, nurse GENERAL:  no fevers, fatigue, appetite changes SKIN: No itching, rash HEENT: No complaint RESPIRATORY: No cough, wheezing, SOB CARDIAC: No chest pain, palpitations, lower extremity edema  GI: No abdominal pain, No N/V/D or constipation, No heartburn or reflux  GU: No dysuria, frequency or urgency, or incontinence  MUSCULOSKELETAL: No unrelieved bone/joint pain NEUROLOGIC: No headache,+ dizziness as per HPI PSYCHIATRIC: No overt anxiety or sadness  Vitals:   08/04/16 1502  BP: (!) 154/67  Pulse: 60  Resp: 18  Temp: 97.9 F (36.6 C)   Body mass index is 25.78 kg/m. Physical Exam  GENERAL APPEARANCE: Alert, conversant, No acute distress  SKIN: No diaphoresis rash HEENT: Unremarkable RESPIRATORY: Breathing is even, unlabored. Lung sounds are clear   CARDIOVASCULAR: Heart RRR no murmurs, rubs or gallops. No peripheral edema  GASTROINTESTINAL: Abdomen is soft, non-tender, not distended w/ normal bowel sounds.  GENITOURINARY: Bladder non tender, not distended  MUSCULOSKELETAL: No abnormal joints or musculature NEUROLOGIC: Cranial nerves 2-12 grossly intact. Moves all extremities PSYCHIATRIC: Mood and affect appropriate to situation, no behavioral issues  Patient Active Problem List   Diagnosis Date Noted  . Vitamin B12 deficiency 07/30/2016  . H/O colon cancer, stage I 07/03/2016  . Lung mass 04/21/2016  . Degenerative cervical spinal stenosis 03/01/2016  . Spinal cord compression due to degenerative disorder of spinal column ([place]) 03/01/2016  . Hypokalemia 03/01/2016  . Myelopathy ([place])   . Generalized weakness 02/19/2016  . Diaphragmatic injury as surgical complication 89/38/1017  . Postoperative anemia due to acute blood loss 08/14/2015  . Dementia without behavioral disturbance 08/14/2015  . Chronic UTI 07/29/2015  . 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 Klebsiella  sepsis ([place]) 05/02/2015  . CAP (community acquired pneumonia) 05/02/2015  . Transaminasemia   . Sepsis ([place]) 04/22/2015  . Paroxysmal atrial fibrillation ([place]) 04/22/2015  . Delirium   . Urinary tract infectious disease   . Osteoarthritis 03/27/2015  . Cerebrovascular accident (CVA) due to thrombosis of cerebral artery ([place]) 03/23/2015  . Moderate tricuspid regurgitation 03/11/2015  . Cardiomyopathy ( [place]), EF 40-45%  02/2015 03/11/2015  . Mild mitral regurgitation 03/11/2015  . Pulmonary arterial hypertension ([place]), mild 03/11/2015  . Mild cognitive impairment 02/24/2015  . Type 2 diabetes mellitus without complication, with long-term current use of insulin ([place]) 02/24/2015  . Hypertensive heart disease with CHF (congestive heart failure) ([place]) 02/24/2015  . Diabetes ([place]) 02/21/2015  . Essential hypertension, benign 02/21/2015  . Hyperlipidemia 02/19/2015  . Gout 02/19/2015  . PAD (peripheral artery disease) ([place]) 02/19/2015  . Cardiac pacemaker in situ 02/19/2015  . CKD (chronic kidney disease) 02/19/2015  CMP     Component Value Date/Time   NA 142 07/10/2016   K 4.1 07/10/2016   CL 101 02/23/2016 0605   CO2 33 (H) 02/23/2016 0605   GLUCOSE 139 (H) 02/23/2016 0605   BUN 23 (A) 07/10/2016   CREATININE 1.2 (A) 07/10/2016   CREATININE 1.12 (H) 02/23/2016 0605   CALCIUM 8.8 (L) 02/23/2016 0605   PROT 7.0 06/21/2015 1317   ALBUMIN 3.3 (L) 06/21/2015 1317   AST 39 06/21/2015 1317   ALT 47 06/21/2015 1317   ALKPHOS 110 06/21/2015 1317   BILITOT 0.4 06/21/2015 1317   GFRNONAA 44 (L) 02/23/2016 0605   GFRAA 52 (L) 02/23/2016 0605    Recent Labs  02/19/16 1318 02/19/16 1633 02/21/16 0449  02/23/16 0605 03/27/16 04/03/16 07/10/16  NA 136  --  137  < > 138 141 141 142  K 3.2*  --  3.3*  --  4.1 4.2 4.7 4.1  CL 97*  --  98*  --  101  --   --   --   CO2 30  --  32  --  33*  --   --   --   GLUCOSE 183*  --  132*  --  139*  --   --   --   BUN 26*  --  21*  < > 19 19 24*  23*  CREATININE 1.21*  --  1.12*  < > 1.12* 0.9 1.1 1.2*  CALCIUM 9.1  --  9.0  --  8.8*  --   --   --   MG  --  1.8  --   --   --   --   --   --   < > = values in this interval not displayed. No results for input(s): AST, ALT, ALKPHOS, BILITOT, PROT, ALBUMIN in the last 8760 hours.  Recent Labs  02/19/16 1318  02/23/16 0605 03/27/16 04/03/16  WBC 8.6  < > 7.4 8.8 8.8  HGB 14.2  < > 12.3 13.9 12.8  HCT 41.7  < > 36.7 42 41  MCV 90.7  --  92.0  --   --   PLT 218  < > 215 223 228  < > = values in this interval not displayed.  Recent Labs  02/20/16 0440 04/03/16  CHOL 222* 234*  LDLCALC 133* 135  TRIG 74 96   No results found for: [facility] Lab Results  Component Value Date   TSH 1.16 04/03/2016   Lab Results  Component Value Date   HGBA1C 8.4 04/03/2016   Lab Results  Component Value Date   CHOL 234 (A) 04/03/2016   HDL 80 (A) 04/03/2016   LDLCALC 135 04/03/2016   TRIG 96 04/03/2016   CHOLHDL 3.0 02/20/2016    Significant Diagnostic Results in last 30 days:  Dg Chest 2 View  Result Date: 07/26/2016 CLINICAL DATA:  Follow-up lung mass EXAM: CHEST  2 VIEW COMPARISON:  04/04/2016, 02/19/2016 FINDINGS: Persistent and enlarged right hilar mass lesion is noted. Some postobstructive changes are noted extending anteriorly similar to that seen on prior exam. Diffuse interstitial changes are again noted. Few scattered nodular densities are noted in the lung bases bilaterally. These were not well appreciated on the prior exam. No sizable effusion is noted. No bony abnormality is seen. Pacing device is again noted. IMPRESSION: Persistent somewhat enlarged right hilar mass. There is are some vague nodular densities in the bases bilaterally. Further evaluation is recommended to assess for progressive disease.  These results will be called to the ordering clinician or representative by the Radiologist Assistant, and communication documented in the PACS or zVision Dashboard. Electronically  Signed   By: Inez Catalina M.D.   On: 07/26/2016 13:43    Assessment and Plan  VERTIGO, mild - antivert 12.5 mg TID scheduled for 2 days; will monitor for response     Lavaeh Bau D. Sheppard Coil, Allison Dunn

## 2016-08-05 ENCOUNTER — Encounter: Payer: Self-pay | Admitting: Internal Medicine

## 2016-08-16 DIAGNOSIS — R1312 Dysphagia, oropharyngeal phase: Secondary | ICD-10-CM | POA: Diagnosis not present

## 2016-08-16 DIAGNOSIS — I5022 Chronic systolic (congestive) heart failure: Secondary | ICD-10-CM | POA: Diagnosis not present

## 2016-08-16 DIAGNOSIS — R488 Other symbolic dysfunctions: Secondary | ICD-10-CM | POA: Diagnosis not present

## 2016-08-20 DIAGNOSIS — M6281 Muscle weakness (generalized): Secondary | ICD-10-CM | POA: Diagnosis not present

## 2016-08-20 DIAGNOSIS — I5022 Chronic systolic (congestive) heart failure: Secondary | ICD-10-CM | POA: Diagnosis not present

## 2016-08-21 ENCOUNTER — Telehealth: Payer: Self-pay | Admitting: Pulmonary Disease

## 2016-08-21 NOTE — Telephone Encounter (Signed)
lmtcb X1 for pt's sister (dpr on file)

## 2016-08-21 NOTE — Telephone Encounter (Signed)
Yes the two could be related.  We are always happy to see her in clinic to discuss more. Please offer tessalon 100mg  po q8h prn cough, dispense #45, refill 1

## 2016-08-21 NOTE — Telephone Encounter (Signed)
Patient's sister, [name], returned call.  CB is [phone].

## 2016-08-21 NOTE — Telephone Encounter (Signed)
Spoke with pt's sister, [name]. States that pt is experiencing some issues with coughing at bedtime. Cough is producing white mucus. Denies chest tightness, wheezing or SOB. Pt's sister would like to know what BQ thinks could be going on and if this is related to her cancer diagnosis.  BQ - please advise. Thanks.

## 2016-08-22 NOTE — Telephone Encounter (Signed)
lmtcb X1 for pt's sister.

## 2016-08-24 MED ORDER — BENZONATATE 100 MG PO CAPS: 100.0000 mg | ORAL_CAPSULE | Freq: Three times a day (TID) | ORAL | 1 refills | Status: DC | PRN

## 2016-08-24 NOTE — Telephone Encounter (Signed)
Called and spoke with pts sister and she is aware of BQ recs.  Med has been called to the pharmacy and nothing further is needed.

## 2016-09-01 ENCOUNTER — Non-Acute Institutional Stay (SKILLED_NURSING_FACILITY): Payer: Medicare Other | Admitting: Internal Medicine

## 2016-09-01 DIAGNOSIS — G301 Alzheimer's disease with late onset: Secondary | ICD-10-CM | POA: Diagnosis not present

## 2016-09-01 DIAGNOSIS — F0280 Dementia in other diseases classified elsewhere without behavioral disturbance: Secondary | ICD-10-CM

## 2016-09-01 DIAGNOSIS — I480 Paroxysmal atrial fibrillation: Secondary | ICD-10-CM | POA: Diagnosis not present

## 2016-09-01 DIAGNOSIS — M109 Gout, unspecified: Secondary | ICD-10-CM

## 2016-09-01 NOTE — Progress Notes (Signed)
Location:  [facility] Room Number: [phone] Place of Service:  SNF (31)  Hennie Duos, MD  Patient Care Team: Hennie Duos, MD as PCP - General (Internal Medicine) Jola Schmidt, MD as Consulting Physician (Ophthalmology) Cameron Sprang, MD as Consulting Physician (Neurology) Evans Lance, MD as Consulting Physician (Cardiology) Juanito Doom, MD as Consulting Physician (Pulmonary Disease)  Extended Emergency Contact Information Primary Emergency Contact: [name],[name] Address: [address]          [place], [place] [address] [name] of [place] Phone: [phone] Mobile Phone: [phone] Relation: Sister    Allergies: Patient has no known allergies.  Chief Complaint  Patient presents with  . Medical Management of Chronic Issues    routine visit    HPI: Patient is 83 y.o. female who Is being seen for routine issues of gout, dementia, and paroxysmal atrial fib.  Past Medical History:  Diagnosis Date  . Arthritis   . Cancer [facility])    breast, left; s/p lumpectomy/chemo/radiation  . CAP (community acquired pneumonia) 05/02/2015  . Cardiac pacemaker in situ 02/19/2015  . Cerebrovascular accident (CVA) due to thrombosis of cerebral artery ([place]) 03/23/2015  . CKD (chronic kidney disease) 02/19/2015  . Degenerative cervical spinal stenosis 03/01/2016  . Dementia without behavioral disturbance 08/14/2015  . Depression   . Diabetes mellitus without complication ([place])   . Gout 02/19/2015  . H/O colon cancer, stage I 07/03/2016   s/p resection 2013 - colonic mucosa with high-grade dysplasia/adenocarcinoma in situ  . Hyperlipidemia   . Hypertension   . Hypertensive heart disease with CHF (congestive heart failure) ([place]) 02/24/2015  . Hypokalemia 03/01/2016  . Lung mass 04/21/2016  . Mild cognitive impairment 02/24/2015  . Mild mitral regurgitation 03/11/2015  . Moderate tricuspid regurgitation 03/11/2015   02/2015   .  Osteoarthritis 03/27/2015  . PAD (peripheral artery disease) ([place]) 02/19/2015   S/p stent in left leg   . Paroxysmal atrial fibrillation (HCC)   . Presence of permanent cardiac pacemaker   . Pulmonary arterial hypertension ([place]), mild 03/11/2015  . Renal disorder   . Spinal cord compression due to degenerative disorder of spinal column ([place]) 03/01/2016  . Transaminasemia   . Type 2 diabetes mellitus without complication, with long-term current use of insulin ([place]) 02/24/2015  . Vitamin B12 deficiency 07/30/2016  . 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 BREAST LUMPECTOMY Left   . CARDIAC PACEMAKER PLACEMENT    . CATARACT EXTRACTION    . EYE SURGERY    . kidney stone removal     x3  . LAPAROSCOPIC NEPHRECTOMY Left 07/29/2015   Procedure: LEFT LAPAROSCOPIC SIMPLE NEPHRECTOMY;  Surgeon: Ardis Hughs, MD;  Location: [facility];  Service: Urology;  Laterality: Left;  . LUMBAR SPINE SURGERY     laminectomy and rod    Allergies as of 09/01/2016   No Known Allergies     Medication List       Accurate as of 09/01/16 11:59 PM. Always use your most recent med list.          acetaminophen 325 MG tablet Commonly known as:  TYLENOL Take 650 mg by mouth every 6 (six) hours as needed.   allopurinol 300 MG tablet Commonly known as:  ZYLOPRIM TAKE ONE TABLET BY MOUTH ONCE DAILY   amiodarone 200 MG tablet Commonly known as:  PACERONE Take 1 tablet (200 mg total) by mouth daily.  amLODipine 10 MG tablet Commonly known as:  NORVASC Take 10 mg by mouth daily.   apixaban 2.5 MG Tabs tablet Commonly known as:  ELIQUIS Take 2.5 mg by mouth 2 (two) times daily.   atorvastatin 80 MG tablet Commonly known as:  LIPITOR Take 1 tablet (80 mg total) by mouth daily with supper.   benazepril 20 MG tablet Commonly known as:  LOTENSIN Take 20 mg by mouth daily.   benzonatate 100 MG capsule Commonly known as:  TESSALON Take 1  capsule (100 mg total) by mouth every 8 (eight) hours as needed for cough.   cyanocobalamin 500 MCG tablet Take 1,500 mcg by mouth daily with lunch. 3 tablets   donepezil 10 MG tablet Commonly known as:  ARICEPT Take 1 tablet (10 mg total) by mouth at bedtime.   feeding supplement (GLUCERNA SHAKE) Liqd Take 237 mLs by mouth 2 (two) times daily between meals.   HUMALOG KWIKPEN 100 UNIT/ML KiwkPen Generic drug:  insulin lispro Inject 5 Units into the skin 3 (three) times daily before meals. When CBG is greater than 200; inject 8 units with a CBG greater than 300   TOUJEO SOLOSTAR 300 UNIT/ML Sopn Generic drug:  Insulin Glargine Inject 4 Units into the skin at bedtime.   WOMENS MULTI VITAMIN & MINERAL PO Take 1 tablet by mouth daily with lunch.       No orders of the defined types were placed in this encounter.   Immunization History  Administered Date(s) Administered  . Influenza, High Dose Seasonal PF 12/30/2015  . PPD Test 04/26/2015, 08/02/2015, 02/23/2016    Social History  Substance Use Topics  . Smoking status: Passive Smoke Exposure - Never Smoker  . Smokeless tobacco: Never Used     Comment: pt's spouse was heavy smoker, smoked in home.   . Alcohol use No    Review of Systems  DATA OBTAINED: from patient, nurse GENERAL:  no fevers, fatigue, appetite changes SKIN: No itching, rash HEENT: No complaint RESPIRATORY: No cough, wheezing, SOB CARDIAC: No chest pain, palpitations, lower extremity edema  GI: No abdominal pain, No N/V/D or constipation, No heartburn or reflux  GU: No dysuria, frequency or urgency, or incontinence  MUSCULOSKELETAL: No unrelieved bone/joint pain NEUROLOGIC: No headache, dizziness  PSYCHIATRIC: No overt anxiety or sadness  Vitals:   09/01/16 1612  BP: (!) 136/54  Pulse: 60  Resp: 18  Temp: 97.6 F (36.4 C)   Body mass index is 25.78 kg/m. Physical Exam  GENERAL APPEARANCE: Alert, conversant, No acute distress  SKIN: No  diaphoresis rash HEENT: Unremarkable RESPIRATORY: Breathing is even, unlabored. Lung sounds are clear   CARDIOVASCULAR: Heart RRR no murmurs, rubs or gallops. No peripheral edema  GASTROINTESTINAL: Abdomen is soft, non-tender, not distended w/ normal bowel sounds.  GENITOURINARY: Bladder non tender, not distended  MUSCULOSKELETAL: No abnormal joints or musculature NEUROLOGIC: Cranial nerves 2-12 grossly intact. Moves all extremities PSYCHIATRIC: Mood and affect appropriate to situation with dementia, no behavioral issues  Patient Active Problem List   Diagnosis Date Noted  . Vitamin B12 deficiency 07/30/2016  . H/O colon cancer, stage I 07/03/2016  . Lung mass 04/21/2016  . Degenerative cervical spinal stenosis 03/01/2016  . Spinal cord compression due to degenerative disorder of spinal column ([place]) 03/01/2016  . Hypokalemia 03/01/2016  . Myelopathy ([place])   . Generalized weakness 02/19/2016  . Diaphragmatic injury as surgical complication 88/41/6606  . Postoperative anemia due to acute blood loss 08/14/2015  . Dementia without behavioral disturbance  08/14/2015  . Chronic UTI 07/29/2015  . 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 Klebsiella sepsis ([place]) 05/02/2015  . CAP (community acquired pneumonia) 05/02/2015  . Transaminasemia   . Sepsis ([place]) 04/22/2015  . Paroxysmal atrial fibrillation ([place]) 04/22/2015  . Delirium   . Urinary tract infectious disease   . Osteoarthritis 03/27/2015  . Cerebrovascular accident (CVA) due to thrombosis of cerebral artery ([place]) 03/23/2015  . Moderate tricuspid regurgitation 03/11/2015  . Cardiomyopathy ([place]), EF 40-45%  02/2015 03/11/2015  . Mild mitral regurgitation 03/11/2015  . Pulmonary arterial hypertension ([place]), mild 03/11/2015  . Mild cognitive impairment 02/24/2015  . Type 2 diabetes mellitus without complication, with long-term current use of insulin ([place]) 02/24/2015  . Hypertensive heart disease with CHF (congestive heart  failure) ([place]) 02/24/2015  . Diabetes ([place]) 02/21/2015  . Essential hypertension, benign 02/21/2015  . Hyperlipidemia 02/19/2015  . Gout 02/19/2015  . PAD (peripheral artery disease) ([place]) 02/19/2015  . Cardiac pacemaker in situ 02/19/2015  . CKD (chronic kidney disease) 02/19/2015    CMP     Component Value Date/Time   NA 141 09/11/2016   K 4.3 09/11/2016   CL 101 02/23/2016 0605   CO2 33 (H) 02/23/2016 0605   GLUCOSE 139 (H) 02/23/2016 0605   BUN 25 (A) 09/11/2016   CREATININE 1.2 (A) 09/11/2016   CREATININE 1.12 (H) 02/23/2016 0605   CALCIUM 8.8 (L) 02/23/2016 0605   PROT 7.0 06/21/2015 1317   ALBUMIN 3.3 (L) 06/21/2015 1317   AST 20 09/11/2016   ALT 17 09/11/2016   ALKPHOS 136 (A) 09/11/2016   BILITOT 0.4 06/21/2015 1317   GFRNONAA 44 (L) 02/23/2016 0605   GFRAA 52 (L) 02/23/2016 0605    Recent Labs  02/19/16 1318 02/19/16 1633 02/21/16 0449  02/23/16 0605  04/03/16 07/10/16 09/11/16  NA 136  --  137  < > 138  < > 141 142 141  K 3.2*  --  3.3*  --  4.1  < > 4.7 4.1 4.3  CL 97*  --  98*  --  101  --   --   --   --   CO2 30  --  32  --  33*  --   --   --   --   GLUCOSE 183*  --  132*  --  139*  --   --   --   --   BUN 26*  --  21*  < > 19  < > 24* 23* 25*  CREATININE 1.21*  --  1.12*  < > 1.12*  < > 1.1 1.2* 1.2*  CALCIUM 9.1  --  9.0  --  8.8*  --   --   --   --   MG  --  1.8  --   --   --   --   --   --   --   < > = values in this interval not displayed.  Recent Labs  09/11/16  AST 20  ALT 17  ALKPHOS 136*    Recent Labs  02/19/16 1318  02/23/16 0605 03/27/16 04/03/16 09/11/16  WBC 8.6  < > 7.4 8.8 8.8 8.7  HGB 14.2  < > 12.3 13.9 12.8 11.9*  HCT 41.7  < > 36.7 42 41 37  MCV 90.7  --  92.0  --   --   --   PLT 218  < > 215 223 228 193  < > = values in this interval not displayed.  Recent Labs  02/20/16 0440 04/03/16 09/11/16  CHOL 222* 234* 203*  LDLCALC 133* 135 121  TRIG 74 96 59   No results found for: [facility] Lab Results  Component  Value Date   TSH 1.67 09/11/2016   Lab Results  Component Value Date   HGBA1C 8.6 09/11/2016   Lab Results  Component Value Date   CHOL 203 (A) 09/11/2016   HDL 70 09/11/2016   LDLCALC 121 09/11/2016   TRIG 59 09/11/2016   CHOLHDL 3.0 02/20/2016    Significant Diagnostic Results in last 30 days:  No results found.  Assessment and Plan  Gout No reported recent flare; plan to continue allopurinol 300 mg by mouth daily  Dementia without behavioral disturbance Stable without major declines; plan to continue Aricept 10 mg by mouth daily  Paroxysmal atrial fibrillation (HCC) Stable; rate controlled on amiodarone 200 mg daily and prophylaxed with Eliquis 2.5 mg twice a day     Allison Schlotter D. Sheppard Coil, MD

## 2016-09-06 ENCOUNTER — Encounter: Payer: Self-pay | Admitting: Internal Medicine

## 2016-09-06 NOTE — Assessment & Plan Note (Signed)
Most recent recent hemoglobin A1c was 8.4 which is a little bit high even for age; will review patient's blood sugar log; in the meantime we'll continue Toujeo 4 units daily at bedtime and NovoLog 5 units with each meal; patient is on statin and patient is also on losartan

## 2016-09-06 NOTE — Assessment & Plan Note (Signed)
Chronic and stable; plan to continue Eliquis 2.5 mg by mouth twice a day which covers for atrial fibrillation as well

## 2016-09-06 NOTE — Assessment & Plan Note (Signed)
Stable without reported exacerbations; plan to continue losartan 20 mg by mouth daily; patient is not on a diuretic

## 2016-09-11 DIAGNOSIS — E119 Type 2 diabetes mellitus without complications: Secondary | ICD-10-CM | POA: Diagnosis not present

## 2016-09-11 DIAGNOSIS — I10 Essential (primary) hypertension: Secondary | ICD-10-CM | POA: Diagnosis not present

## 2016-09-11 DIAGNOSIS — E559 Vitamin D deficiency, unspecified: Secondary | ICD-10-CM | POA: Diagnosis not present

## 2016-09-11 DIAGNOSIS — E785 Hyperlipidemia, unspecified: Secondary | ICD-10-CM | POA: Diagnosis not present

## 2016-09-11 DIAGNOSIS — E039 Hypothyroidism, unspecified: Secondary | ICD-10-CM | POA: Diagnosis not present

## 2016-09-11 LAB — HEMOGLOBIN A1C: HEMOGLOBIN A1C: 8.6

## 2016-09-11 LAB — CBC AND DIFFERENTIAL
HEMATOCRIT: 37 (ref 36–46)
Hemoglobin: 11.9 — AB (ref 12.0–16.0)
PLATELETS: 193 (ref 150–399)
WBC: 8.7

## 2016-09-11 LAB — LIPID PANEL
Cholesterol: 203 — AB (ref 0–200)
HDL: 70 (ref 35–70)
LDL Cholesterol: 121
Triglycerides: 59 (ref 40–160)

## 2016-09-11 LAB — HEPATIC FUNCTION PANEL
ALT: 17 (ref 7–35)
AST: 20 (ref 13–35)
Alkaline Phosphatase: 136 — AB (ref 25–125)
BILIRUBIN, TOTAL: 0.5

## 2016-09-11 LAB — BASIC METABOLIC PANEL
BUN: 25 — AB (ref 4–21)
CREATININE: 1.2 — AB (ref 0.5–1.1)
Glucose: 150
Potassium: 4.3 (ref 3.4–5.3)
SODIUM: 141 (ref 137–147)

## 2016-09-11 LAB — TSH: TSH: 1.67 (ref 0.41–5.90)

## 2016-09-11 LAB — VITAMIN D 25 HYDROXY (VIT D DEFICIENCY, FRACTURES): VIT D 25 HYDROXY: 59.71

## 2016-09-14 ENCOUNTER — Other Ambulatory Visit: Payer: Self-pay

## 2016-09-20 DIAGNOSIS — H31013 Macula scars of posterior pole (postinflammatory) (post-traumatic), bilateral: Secondary | ICD-10-CM | POA: Diagnosis not present

## 2016-09-20 DIAGNOSIS — E119 Type 2 diabetes mellitus without complications: Secondary | ICD-10-CM | POA: Diagnosis not present

## 2016-09-20 DIAGNOSIS — Z961 Presence of intraocular lens: Secondary | ICD-10-CM | POA: Diagnosis not present

## 2016-10-03 ENCOUNTER — Encounter: Payer: Self-pay | Admitting: Internal Medicine

## 2016-10-03 ENCOUNTER — Non-Acute Institutional Stay (SKILLED_NURSING_FACILITY): Payer: Medicare Other | Admitting: Internal Medicine

## 2016-10-03 DIAGNOSIS — E785 Hyperlipidemia, unspecified: Secondary | ICD-10-CM | POA: Diagnosis not present

## 2016-10-03 DIAGNOSIS — I739 Peripheral vascular disease, unspecified: Secondary | ICD-10-CM

## 2016-10-03 DIAGNOSIS — N183 Chronic kidney disease, stage 3 unspecified: Secondary | ICD-10-CM

## 2016-10-03 NOTE — Progress Notes (Signed)
Location:  [facility] Room Number: [phone] Place of Service:  SNF (31)  Hennie Duos, MD  Patient Care Team: Hennie Duos, MD as PCP - General (Internal Medicine) Jola Schmidt, MD as Consulting Physician (Ophthalmology) Cameron Sprang, MD as Consulting Physician (Neurology) Evans Lance, MD as Consulting Physician (Cardiology) Juanito Doom, MD as Consulting Physician (Pulmonary Disease)  Extended Emergency Contact Information Primary Emergency Contact: [name],[name] Address: [address]          [place], [place] [address] [name] of [place] Phone: [phone] Mobile Phone: [phone] Relation: Sister    Allergies: Patient has no known allergies.  Chief Complaint  Patient presents with  . Medical Management of Chronic Issues    routine visit  . Health Maintenance    order for diabetic foot and eye exam given to schedule     HPI: Patient is 83 y.o. female who   Past Medical History:  Diagnosis Date  . Arthritis   . Cancer [facility])    breast, left; s/p lumpectomy/chemo/radiation  . CAP (community acquired pneumonia) 05/02/2015  . Cardiac pacemaker in situ 02/19/2015  . Cerebrovascular accident (CVA) due to thrombosis of cerebral artery ([place]) 03/23/2015  . CKD (chronic kidney disease) 02/19/2015  . Degenerative cervical spinal stenosis 03/01/2016  . Dementia without behavioral disturbance 08/14/2015  . Depression   . Diabetes mellitus without complication ([place])   . Gout 02/19/2015  . H/O colon cancer, stage I 07/03/2016   s/p resection 2013 - colonic mucosa with high-grade dysplasia/adenocarcinoma in situ  . Hyperlipidemia   . Hypertension   . Hypertensive heart disease with CHF (congestive heart failure) ([place]) 02/24/2015  . Hypokalemia 03/01/2016  . Lung mass 04/21/2016  . Mild cognitive impairment 02/24/2015  . Mild mitral regurgitation 03/11/2015  . Moderate tricuspid regurgitation 03/11/2015   02/2015   .  Osteoarthritis 03/27/2015  . PAD (peripheral artery disease) ([place]) 02/19/2015   S/p stent in left leg   . Paroxysmal atrial fibrillation (HCC)   . Presence of permanent cardiac pacemaker   . Pulmonary arterial hypertension ([place]), mild 03/11/2015  . Renal disorder   . Spinal cord compression due to degenerative disorder of spinal column ([place]) 03/01/2016  . Transaminasemia   . Type 2 diabetes mellitus without complication, with long-term current use of insulin ([place]) 02/24/2015  . Vitamin B12 deficiency 07/30/2016  . 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 BREAST LUMPECTOMY Left   . CARDIAC PACEMAKER PLACEMENT    . CATARACT EXTRACTION    . EYE SURGERY    . kidney stone removal     x3  . LAPAROSCOPIC NEPHRECTOMY Left 07/29/2015   Procedure: LEFT LAPAROSCOPIC SIMPLE NEPHRECTOMY;  Surgeon: Ardis Hughs, MD;  Location: [facility];  Service: Urology;  Laterality: Left;  . LUMBAR SPINE SURGERY     laminectomy and rod    Allergies as of 10/03/2016   No Known Allergies     Medication List       Accurate as of 10/03/16 11:59 PM. Always use your most recent med list.          acetaminophen 325 MG tablet Commonly known as:  TYLENOL Take 650 mg by mouth every 6 (six) hours as needed.   allopurinol 300 MG tablet Commonly known as:  ZYLOPRIM TAKE ONE TABLET BY MOUTH ONCE DAILY   amiodarone 200 MG tablet Commonly known as:  PACERONE Take 1 tablet (200  mg total) by mouth daily.   amLODipine 10 MG tablet Commonly known as:  NORVASC Take 10 mg by mouth daily.   apixaban 2.5 MG Tabs tablet Commonly known as:  ELIQUIS Take 2.5 mg by mouth 2 (two) times daily.   atorvastatin 80 MG tablet Commonly known as:  LIPITOR Take 1 tablet (80 mg total) by mouth daily with supper.   benazepril 20 MG tablet Commonly known as:  LOTENSIN Take 20 mg by mouth daily.   benzonatate 100 MG capsule Commonly known as:  TESSALON Take 1  capsule (100 mg total) by mouth every 8 (eight) hours as needed for cough.   cyanocobalamin 500 MCG tablet Take 1,500 mcg by mouth daily with lunch. 3 tablets   donepezil 10 MG tablet Commonly known as:  ARICEPT Take 1 tablet (10 mg total) by mouth at bedtime.   feeding supplement (GLUCERNA SHAKE) Liqd Take 237 mLs by mouth 2 (two) times daily between meals.   HUMALOG KWIKPEN 100 UNIT/ML KiwkPen Generic drug:  insulin lispro Inject 5 Units into the skin 3 (three) times daily before meals. When CBG is greater than 200; inject 8 units with a CBG greater than 300   TOUJEO SOLOSTAR 300 UNIT/ML Sopn Generic drug:  Insulin Glargine Inject 4 Units into the skin at bedtime.   WOMENS MULTI VITAMIN & MINERAL PO Take 1 tablet by mouth daily with lunch.       No orders of the defined types were placed in this encounter.   Immunization History  Administered Date(s) Administered  . Influenza, High Dose Seasonal PF 12/30/2015  . PPD Test 04/26/2015, 08/02/2015, 02/23/2016    Social History  Substance Use Topics  . Smoking status: Passive Smoke Exposure - Never Smoker  . Smokeless tobacco: Never Used     Comment: pt's spouse was heavy smoker, smoked in home.   . Alcohol use No    Review of Systems  DATA OBTAINED: from patient, nurse GENERAL:  no fevers, fatigue, appetite changes SKIN: No itching, rash HEENT: No complaint RESPIRATORY: No cough, wheezing, SOB CARDIAC: No chest pain, palpitations, lower extremity edema  GI: No abdominal pain, No N/V/D or constipation, No heartburn or reflux  GU: No dysuria, frequency or urgency, or incontinence  MUSCULOSKELETAL: No unrelieved bone/joint pain NEUROLOGIC: No headache, dizziness  PSYCHIATRIC: No overt anxiety or sadness  Vitals:   10/03/16 1257  BP: (!) 136/54  Pulse: (!) 56  Resp: 20  Temp: 97.6 F (36.4 C)   Body mass index is 25.78 kg/m. Physical Exam  GENERAL APPEARANCE: Alert, conversant, No acute distress  SKIN:  No diaphoresis rash HEENT: Unremarkable RESPIRATORY: Breathing is even, unlabored. Lung sounds are clear   CARDIOVASCULAR: Heart RRR no murmurs, rubs or gallops. No peripheral edema  GASTROINTESTINAL: Abdomen is soft, non-tender, not distended w/ normal bowel sounds.  GENITOURINARY: Bladder non tender, not distended  MUSCULOSKELETAL: No abnormal joints or musculature NEUROLOGIC: Cranial nerves 2-12 grossly intact. Moves all extremities PSYCHIATRIC: Mood and affect appropriate to situation, no behavioral   Physical examination has not changed since last visit.  Patient Active Problem List   Diagnosis Date Noted  . Vitamin B12 deficiency 07/30/2016  . H/O colon cancer, stage I 07/03/2016  . Lung mass 04/21/2016  . Degenerative cervical spinal stenosis 03/01/2016  . Spinal cord compression due to degenerative disorder of spinal column ([place]) 03/01/2016  . Hypokalemia 03/01/2016  . Myelopathy ([place])   . Generalized weakness 02/19/2016  . Diaphragmatic injury as surgical complication 08/65/7846  .  Postoperative anemia due to acute blood loss 08/14/2015  . Dementia without behavioral disturbance 08/14/2015  . Chronic UTI 07/29/2015  . 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 Klebsiella sepsis ([place]) 05/02/2015  . CAP (community acquired pneumonia) 05/02/2015  . Transaminasemia   . Sepsis ([place]) 04/22/2015  . Paroxysmal atrial fibrillation ([place]) 04/22/2015  . Delirium   . Urinary tract infectious disease   . Osteoarthritis 03/27/2015  . Cerebrovascular accident (CVA) due to thrombosis of cerebral artery ([place]) 03/23/2015  . Moderate tricuspid regurgitation 03/11/2015  . Cardiomyopathy ([place]), EF 40-45%  02/2015 03/11/2015  . Mild mitral regurgitation 03/11/2015  . Pulmonary arterial hypertension ([place]), mild 03/11/2015  . Mild cognitive impairment 02/24/2015  . Type 2 diabetes mellitus without complication, with long-term current use of insulin ([place]) 02/24/2015  . Hypertensive  heart disease with CHF (congestive heart failure) ([place]) 02/24/2015  . Diabetes ([place]) 02/21/2015  . Essential hypertension, benign 02/21/2015  . Hyperlipidemia 02/19/2015  . Gout 02/19/2015  . PAD (peripheral artery disease) ([place]) 02/19/2015  . Cardiac pacemaker in situ 02/19/2015  . CKD (chronic kidney disease) 02/19/2015    CMP     Component Value Date/Time   NA 141 09/11/2016   K 4.3 09/11/2016   CL 101 02/23/2016 0605   CO2 33 (H) 02/23/2016 0605   GLUCOSE 139 (H) 02/23/2016 0605   BUN 25 (A) 09/11/2016   CREATININE 1.2 (A) 09/11/2016   CREATININE 1.12 (H) 02/23/2016 0605   CALCIUM 8.8 (L) 02/23/2016 0605   PROT 7.0 06/21/2015 1317   ALBUMIN 3.3 (L) 06/21/2015 1317   AST 20 09/11/2016   ALT 17 09/11/2016   ALKPHOS 136 (A) 09/11/2016   BILITOT 0.4 06/21/2015 1317   GFRNONAA 44 (L) 02/23/2016 0605   GFRAA 52 (L) 02/23/2016 0605    Recent Labs  02/19/16 1318 02/19/16 1633 02/21/16 0449  02/23/16 0605  04/03/16 07/10/16 09/11/16  NA 136  --  137  < > 138  < > 141 142 141  K 3.2*  --  3.3*  --  4.1  < > 4.7 4.1 4.3  CL 97*  --  98*  --  101  --   --   --   --   CO2 30  --  32  --  33*  --   --   --   --   GLUCOSE 183*  --  132*  --  139*  --   --   --   --   BUN 26*  --  21*  < > 19  < > 24* 23* 25*  CREATININE 1.21*  --  1.12*  < > 1.12*  < > 1.1 1.2* 1.2*  CALCIUM 9.1  --  9.0  --  8.8*  --   --   --   --   MG  --  1.8  --   --   --   --   --   --   --   < > = values in this interval not displayed.  Recent Labs  09/11/16  AST 20  ALT 17  ALKPHOS 136*    Recent Labs  02/19/16 1318  02/23/16 0605 03/27/16 04/03/16 09/11/16  WBC 8.6  < > 7.4 8.8 8.8 8.7  HGB 14.2  < > 12.3 13.9 12.8 11.9*  HCT 41.7  < > 36.7 42 41 37  MCV 90.7  --  92.0  --   --   --   PLT 218  < > 215  223 228 193  < > = values in this interval not displayed.  Recent Labs  02/20/16 0440 04/03/16 09/11/16  CHOL 222* 234* 203*  LDLCALC 133* 135 121  TRIG 74 96 59   No results found  for: [facility] Lab Results  Component Value Date   TSH 1.67 09/11/2016   Lab Results  Component Value Date   HGBA1C 8.6 09/11/2016   Lab Results  Component Value Date   CHOL 203 (A) 09/11/2016   HDL 70 09/11/2016   LDLCALC 121 09/11/2016   TRIG 59 09/11/2016   CHOLHDL 3.0 02/20/2016    Significant Diagnostic Results in last 30 days:  No results found.  Assessment and Plan  Hyperlipidemia Recent LDL 121, HDL 70, triglycerides 59; patient already on high-dose Lipitor 80 mg by mouth daily; plan to continue same  CKD (chronic kidney disease) Was recently GFR is 28; recent creatinine 1.2 which is stable from prior; we'll plan to monitor intervals  PAD (peripheral artery disease) ([place]) Neuro reported skin breakdowns or when; plan to continue Eliquis 2.5 mg twice a day, also use for atrial fibrillation,; patient is on a statin    Webb Silversmith D. Sheppard Coil, MD

## 2016-10-05 ENCOUNTER — Encounter: Payer: Self-pay | Admitting: Internal Medicine

## 2016-10-05 NOTE — Assessment & Plan Note (Signed)
No reported recent flare; plan to continue allopurinol 300 mg by mouth daily

## 2016-10-05 NOTE — Assessment & Plan Note (Signed)
Stable without major declines; plan to continue Aricept 10 mg by mouth daily

## 2016-10-05 NOTE — Assessment & Plan Note (Signed)
Stable; rate controlled on amiodarone 200 mg daily and prophylaxed with Eliquis 2.5 mg twice a day

## 2016-10-06 ENCOUNTER — Encounter: Payer: Self-pay | Admitting: Internal Medicine

## 2016-10-06 ENCOUNTER — Non-Acute Institutional Stay (SKILLED_NURSING_FACILITY): Payer: Medicare Other | Admitting: Internal Medicine

## 2016-10-06 DIAGNOSIS — R600 Localized edema: Secondary | ICD-10-CM

## 2016-10-06 DIAGNOSIS — R0902 Hypoxemia: Secondary | ICD-10-CM | POA: Diagnosis not present

## 2016-10-06 NOTE — Progress Notes (Signed)
Location:  [employer] and [place] of Service:  SNF (31) skilled nursing facility  Hennie Duos, MD  Patient Care Team: Hennie Duos, MD as PCP - General (Internal Medicine) Jola Schmidt, MD as Consulting Physician (Ophthalmology) Cameron Sprang, MD as Consulting Physician (Neurology) Evans Lance, MD as Consulting Physician (Cardiology) Juanito Doom, MD as Consulting Physician (Pulmonary Disease)  Extended Emergency Contact Information Primary Emergency Contact: [name],[name] Address: [address]          [place], [place] [address] [name] of [place] Phone: [phone] Mobile Phone: [phone] Relation: Sister    Allergies: Patient has no known allergies.  Chief Complaint  Patient presents with  . Acute Visit    HPI: Patient is 83 y.o. female who Nursing asked me to see because patient's O2 saturations have been dropping in the last few days. Reported O2 sat on room air was 86-88% at night and noted to be 94-95% during the day. It is noted the patient has had increased pedal edema. No cough cold fever or other systemic . Better with oxygen, worse with exertion.  Past Medical History:  Diagnosis Date  . Arthritis   . Cancer [facility])    breast, left; s/p lumpectomy/chemo/radiation  . CAP (community acquired pneumonia) 05/02/2015  . Cardiac pacemaker in situ 02/19/2015  . Cerebrovascular accident (CVA) due to thrombosis of cerebral artery ([place]) 03/23/2015  . CKD (chronic kidney disease) 02/19/2015  . Degenerative cervical spinal stenosis 03/01/2016  . Dementia without behavioral disturbance 08/14/2015  . Depression   . Diabetes mellitus without complication ([place])   . Gout 02/19/2015  . H/O colon cancer, stage I 07/03/2016   s/p resection 2013 - colonic mucosa with high-grade dysplasia/adenocarcinoma in situ  . Hyperlipidemia   . Hypertension   . Hypertensive heart disease with CHF (congestive heart failure) ([place])  02/24/2015  . Hypokalemia 03/01/2016  . Lung mass 04/21/2016  . Mild cognitive impairment 02/24/2015  . Mild mitral regurgitation 03/11/2015  . Moderate tricuspid regurgitation 03/11/2015   02/2015   . Osteoarthritis 03/27/2015  . PAD (peripheral artery disease) ([place]) 02/19/2015   S/p stent in left leg   . Paroxysmal atrial fibrillation (HCC)   . Presence of permanent cardiac pacemaker   . Pulmonary arterial hypertension ([place]), mild 03/11/2015  . Renal disorder   . Spinal cord compression due to degenerative disorder of spinal column ([place]) 03/01/2016  . Transaminasemia   . Type 2 diabetes mellitus without complication, with long-term current use of insulin ([place]) 02/24/2015  . Vitamin B12 deficiency 07/30/2016  . 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 BREAST LUMPECTOMY Left   . CARDIAC PACEMAKER PLACEMENT    . CATARACT EXTRACTION    . EYE SURGERY    . kidney stone removal     x3  . LAPAROSCOPIC NEPHRECTOMY Left 07/29/2015   Procedure: LEFT LAPAROSCOPIC SIMPLE NEPHRECTOMY;  Surgeon: Ardis Hughs, MD;  Location: [facility] ORS;  Service: Urology;  Laterality: Left;  . LUMBAR SPINE SURGERY     laminectomy and rod    Allergies as of 10/06/2016   No Known Allergies     Medication List       Accurate as of 10/06/16 11:59 PM. Always use your most recent med list.          acetaminophen 325 MG tablet Commonly known as:  TYLENOL Take 650 mg by mouth every 6 (six) hours as needed.  allopurinol 300 MG tablet Commonly known as:  ZYLOPRIM TAKE ONE TABLET BY MOUTH ONCE DAILY   amiodarone 200 MG tablet Commonly known as:  PACERONE Take 1 tablet (200 mg total) by mouth daily.   amLODipine 10 MG tablet Commonly known as:  NORVASC Take 10 mg by mouth daily.   apixaban 2.5 MG Tabs tablet Commonly known as:  ELIQUIS Take 2.5 mg by mouth 2 (two) times daily.   atorvastatin 80 MG tablet Commonly known as:  LIPITOR Take 1 tablet (80  mg total) by mouth daily with supper.   benazepril 20 MG tablet Commonly known as:  LOTENSIN Take 20 mg by mouth daily.   benzonatate 100 MG capsule Commonly known as:  TESSALON Take 1 capsule (100 mg total) by mouth every 8 (eight) hours as needed for cough.   cyanocobalamin 500 MCG tablet Take 1,500 mcg by mouth daily with lunch. 3 tablets   donepezil 10 MG tablet Commonly known as:  ARICEPT Take 1 tablet (10 mg total) by mouth at bedtime.   feeding supplement (GLUCERNA SHAKE) Liqd Take 237 mLs by mouth 2 (two) times daily between meals.   HUMALOG KWIKPEN 100 UNIT/ML KiwkPen Generic drug:  insulin lispro Inject 5 Units into the skin 3 (three) times daily before meals. When CBG is greater than 200; inject 8 units with a CBG greater than 300   TOUJEO SOLOSTAR 300 UNIT/ML Sopn Generic drug:  Insulin Glargine Inject 4 Units into the skin at bedtime.   WOMENS MULTI VITAMIN & MINERAL PO Take 1 tablet by mouth daily with lunch.       No orders of the defined types were placed in this encounter.   Immunization History  Administered Date(s) Administered  . Influenza, High Dose Seasonal PF 12/30/2015  . PPD Test 04/26/2015, 08/02/2015, 02/23/2016    Social History  Substance Use Topics  . Smoking status: Passive Smoke Exposure - Never Smoker  . Smokeless tobacco: Never Used     Comment: pt's spouse was heavy smoker, smoked in home.   . Alcohol use No    Review of Systems  DATA OBTAINED: from patient, nurse, medical record, family member GENERAL:  no fevers, fatigue, appetite changes SKIN: No itching, rash HEENT: No complaint RESPIRATORY: No cough, wheezing, SOB; Desaturating at night CARDIAC: No chest pain, palpitations,+ lower extremity edema  GI: No abdominal pain, No N/V/D or constipation, No heartburn or reflux  GU: No dysuria, frequency or urgency, or incontinence  MUSCULOSKELETAL: No unrelieved bone/joint pain NEUROLOGIC: No headache, dizziness    PSYCHIATRIC: No overt anxiety or sadness  Vitals:   10/06/16 1024  BP: (!) 136/54  Pulse: (!) 56  Resp: 20  Temp: 97.6 F (36.4 C)   Body mass index is 28.16 kg/m. Physical Exam  GENERAL APPEARANCE: Alert, conversant, No acute distress  SKIN: No diaphoresis rash HEENT: Unremarkable RESPIRATORY: Breathing is even, unlabored. Lung sounds are clear   CARDIOVASCULAR: Heart RRR no murmurs, rubs or gallops. + peripheral edema  GASTROINTESTINAL: Abdomen is soft, non-tender, not distended w/ normal bowel sounds.  GENITOURINARY: Bladder non tender, not distended  MUSCULOSKELETAL: No abnormal joints or musculature NEUROLOGIC: Cranial nerves 2-12 grossly intact. Moves all extremities PSYCHIATRIC: Mood and affect appropriate to situation with dementia, no behavioral issues  Patient Active Problem List   Diagnosis Date Noted  . Vitamin B12 deficiency 07/30/2016  . H/O colon cancer, stage I 07/03/2016  . Lung mass 04/21/2016  . Degenerative cervical spinal stenosis 03/01/2016  . Spinal cord compression  due to degenerative disorder of spinal column ([place]) 03/01/2016  . Hypokalemia 03/01/2016  . Myelopathy ([place])   . Generalized weakness 02/19/2016  . Diaphragmatic injury as surgical complication 58/85/0277  . Postoperative anemia due to acute blood loss 08/14/2015  . Dementia without behavioral disturbance 08/14/2015  . Chronic UTI 07/29/2015  . 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 Klebsiella sepsis ([place]) 05/02/2015  . CAP (community acquired pneumonia) 05/02/2015  . Transaminasemia   . Sepsis ([place]) 04/22/2015  . Paroxysmal atrial fibrillation ([place]) 04/22/2015  . Delirium   . Urinary tract infectious disease   . Osteoarthritis 03/27/2015  . Cerebrovascular accident (CVA) due to thrombosis of cerebral artery ([place]) 03/23/2015  . Moderate tricuspid regurgitation 03/11/2015  . Cardiomyopathy ([place]), EF 40-45%  02/2015 03/11/2015  . Mild mitral regurgitation 03/11/2015  .  Pulmonary arterial hypertension ([place]), mild 03/11/2015  . Mild cognitive impairment 02/24/2015  . Type 2 diabetes mellitus without complication, with long-term current use of insulin ([place]) 02/24/2015  . Hypertensive heart disease with CHF (congestive heart failure) ([place]) 02/24/2015  . Diabetes ([place]) 02/21/2015  . Essential hypertension, benign 02/21/2015  . Hyperlipidemia 02/19/2015  . Gout 02/19/2015  . PAD (peripheral artery disease) ([place]) 02/19/2015  . Cardiac pacemaker in situ 02/19/2015  . CKD (chronic kidney disease) 02/19/2015    CMP     Component Value Date/Time   NA 141 09/11/2016   K 4.3 09/11/2016   CL 101 02/23/2016 0605   CO2 33 (H) 02/23/2016 0605   GLUCOSE 139 (H) 02/23/2016 0605   BUN 25 (A) 09/11/2016   CREATININE 1.2 (A) 09/11/2016   CREATININE 1.12 (H) 02/23/2016 0605   CALCIUM 8.8 (L) 02/23/2016 0605   PROT 7.0 06/21/2015 1317   ALBUMIN 3.3 (L) 06/21/2015 1317   AST 20 09/11/2016   ALT 17 09/11/2016   ALKPHOS 136 (A) 09/11/2016   BILITOT 0.4 06/21/2015 1317   GFRNONAA 44 (L) 02/23/2016 0605   GFRAA 52 (L) 02/23/2016 0605    Recent Labs  02/19/16 1318 02/19/16 1633 02/21/16 0449  02/23/16 0605  04/03/16 07/10/16 09/11/16  NA 136  --  137  < > 138  < > 141 142 141  K 3.2*  --  3.3*  --  4.1  < > 4.7 4.1 4.3  CL 97*  --  98*  --  101  --   --   --   --   CO2 30  --  32  --  33*  --   --   --   --   GLUCOSE 183*  --  132*  --  139*  --   --   --   --   BUN 26*  --  21*  < > 19  < > 24* 23* 25*  CREATININE 1.21*  --  1.12*  < > 1.12*  < > 1.1 1.2* 1.2*  CALCIUM 9.1  --  9.0  --  8.8*  --   --   --   --   MG  --  1.8  --   --   --   --   --   --   --   < > = values in this interval not displayed.  Recent Labs  09/11/16  AST 20  ALT 17  ALKPHOS 136*    Recent Labs  02/19/16 1318  02/23/16 0605 03/27/16 04/03/16 09/11/16  WBC 8.6  < > 7.4 8.8 8.8 8.7  HGB 14.2  < > 12.3 13.9 12.8 11.9*  HCT 41.7  < > 36.7 42 41 37  MCV 90.7  --  92.0  --    --   --   PLT 218  < > 215 223 228 193  < > = values in this interval not displayed.  Recent Labs  02/20/16 0440 04/03/16 09/11/16  CHOL 222* 234* 203*  LDLCALC 133* 135 121  TRIG 74 96 59   No results found for: [facility] Lab Results  Component Value Date   TSH 1.67 09/11/2016   Lab Results  Component Value Date   HGBA1C 8.6 09/11/2016   Lab Results  Component Value Date   CHOL 203 (A) 09/11/2016   HDL 70 09/11/2016   LDLCALC 121 09/11/2016   TRIG 59 09/11/2016   CHOLHDL 3.0 02/20/2016    Significant Diagnostic Results in last 30 days:  No results found.  Assessment and Plan  DECREASED O2 SATURATION/LOWER EXTREMITY EDEMA-this is happened to patient prior; we'll start Lasix 20 mg daily for 7 days with a BMP on 8/22; O2 2 L nasal cannula daily at bedtime for 7 days; we'll await patient twice a day for 2 weeks; will continue to monitor   Purl Claytor D. Sheppard Coil, MD

## 2016-10-07 ENCOUNTER — Encounter: Payer: Self-pay | Admitting: Internal Medicine

## 2016-10-09 ENCOUNTER — Encounter: Payer: Self-pay | Admitting: Internal Medicine

## 2016-10-12 DIAGNOSIS — I10 Essential (primary) hypertension: Secondary | ICD-10-CM | POA: Diagnosis not present

## 2016-10-12 DIAGNOSIS — D649 Anemia, unspecified: Secondary | ICD-10-CM | POA: Diagnosis not present

## 2016-10-14 ENCOUNTER — Encounter: Payer: Self-pay | Admitting: Internal Medicine

## 2016-10-14 NOTE — Assessment & Plan Note (Signed)
Neuro reported skin breakdowns or when; plan to continue Eliquis 2.5 mg twice a day, also use for atrial fibrillation,; patient is on a statin

## 2016-10-14 NOTE — Assessment & Plan Note (Signed)
Recent LDL 121, HDL 70, triglycerides 59; patient already on high-dose Lipitor 80 mg by mouth daily; plan to continue same

## 2016-10-14 NOTE — Assessment & Plan Note (Signed)
Was recently GFR is 44; recent creatinine 1.2 which is stable from prior; we'll plan to monitor intervals

## 2016-10-24 ENCOUNTER — Telehealth: Payer: Self-pay | Admitting: Internal Medicine

## 2016-10-24 DIAGNOSIS — R601 Generalized edema: Secondary | ICD-10-CM

## 2016-10-24 MED ORDER — FUROSEMIDE 40 MG PO TABS: 60.0000 mg | ORAL_TABLET | Freq: Every day | ORAL | 11 refills | Status: DC

## 2016-10-24 NOTE — Telephone Encounter (Signed)
New Message   Per daughter she has gained 15lbs in a week   Pt c/o swelling: STAT is pt has developed SOB within 24 hours  1. How long have you been experiencing swelling? A week   2. Where is the swelling located?  All over   3.  Are you currently taking a "fluid pill"? No 4-5 days but it did not help   4.  Are you currently SOB?  A couple nights ago yes, and they had to give her oxygen   5.  Have you traveled recently? No in a nursing home

## 2016-10-24 NOTE — Telephone Encounter (Signed)
Call placed to Pt sister.  Reports generalized edema, weight gain of 15 pounds, sob with new oxygen requirement.  Order received from Dr. Bailey Mech 60 mg by mouth x 3 days then decrease to furosemide 40 mg by mouth daily.  Repeat BMP in one week and follow up with PA/NP.  Orders entered.  Pt scheduled for appt with Richardson Dopp 10/31/2016 @ 2:15 pm.  Notified sister and asked to arrive @ 2:00 pm to get lab work prior to office visit.  New order faxed to [facility].  Facility and sister indicate understanding.  Notified to call if any further issues.

## 2016-10-24 NOTE — Telephone Encounter (Signed)
Follow up      Calling to give nurse [facility] number.  It is [phone]

## 2016-10-25 ENCOUNTER — Non-Acute Institutional Stay (SKILLED_NURSING_FACILITY): Payer: Medicare Other | Admitting: Internal Medicine

## 2016-10-25 DIAGNOSIS — M6281 Muscle weakness (generalized): Secondary | ICD-10-CM | POA: Diagnosis not present

## 2016-10-25 DIAGNOSIS — R635 Abnormal weight gain: Secondary | ICD-10-CM | POA: Diagnosis not present

## 2016-10-25 DIAGNOSIS — R609 Edema, unspecified: Secondary | ICD-10-CM

## 2016-10-25 DIAGNOSIS — I5022 Chronic systolic (congestive) heart failure: Secondary | ICD-10-CM | POA: Diagnosis not present

## 2016-10-26 ENCOUNTER — Encounter: Payer: Self-pay | Admitting: Internal Medicine

## 2016-10-26 ENCOUNTER — Non-Acute Institutional Stay (SKILLED_NURSING_FACILITY): Payer: Medicare Other | Admitting: Internal Medicine

## 2016-10-26 DIAGNOSIS — I110 Hypertensive heart disease with heart failure: Secondary | ICD-10-CM | POA: Diagnosis not present

## 2016-10-26 DIAGNOSIS — I5022 Chronic systolic (congestive) heart failure: Secondary | ICD-10-CM

## 2016-10-26 DIAGNOSIS — E119 Type 2 diabetes mellitus without complications: Secondary | ICD-10-CM | POA: Diagnosis not present

## 2016-10-26 DIAGNOSIS — I6330 Cerebral infarction due to thrombosis of unspecified cerebral artery: Secondary | ICD-10-CM | POA: Diagnosis not present

## 2016-10-26 DIAGNOSIS — Z794 Long term (current) use of insulin: Secondary | ICD-10-CM | POA: Diagnosis not present

## 2016-10-26 DIAGNOSIS — M6281 Muscle weakness (generalized): Secondary | ICD-10-CM | POA: Diagnosis not present

## 2016-10-26 NOTE — Progress Notes (Signed)
Location:  [place] Room Number: [place] of Service:  SNF [phone])  Hennie Duos, MD  Patient Care Team: Hennie Duos, MD as PCP - General (Internal Medicine) Jola Schmidt, MD as Consulting Physician (Ophthalmology) Cameron Sprang, MD as Consulting Physician (Neurology) Evans Lance, MD as Consulting Physician (Cardiology) Juanito Doom, MD as Consulting Physician (Pulmonary Disease)  Extended Emergency Contact Information Primary Emergency Contact: [name],[name] Address: [address]          [place], [place] [address] [name] of [place] Phone: [phone] Mobile Phone: [phone] Relation: Sister    Allergies: Patient has no known allergies.  Chief Complaint  Patient presents with  . Medical Management of Chronic Issues    HPI: Patient is 83 y.o. female who Is being seen for routine issues of history of CVA, hypertension, and diabetes mellitus 2.  Past Medical History:  Diagnosis Date  . Arthritis   . Cancer [facility])    breast, left; s/p lumpectomy/chemo/radiation  . CAP (community acquired pneumonia) 05/02/2015  . Cardiac pacemaker in situ 02/19/2015  . Cerebrovascular accident (CVA) due to thrombosis of cerebral artery ([place]) 03/23/2015  . CKD (chronic kidney disease) 02/19/2015  . Degenerative cervical spinal stenosis 03/01/2016  . Dementia without behavioral disturbance 08/14/2015  . Depression   . Diabetes mellitus without complication ([place])   . Gout 02/19/2015  . H/O colon cancer, stage I 07/03/2016   s/p resection 2013 - colonic mucosa with high-grade dysplasia/adenocarcinoma in situ  . Hyperlipidemia   . Hypertension   . Hypertensive heart disease with CHF (congestive heart failure) ([place]) 02/24/2015  . Hypokalemia 03/01/2016  . Lung mass 04/21/2016  . Mild cognitive impairment 02/24/2015  . Mild mitral regurgitation 03/11/2015  . Moderate tricuspid regurgitation 03/11/2015   02/2015   . Osteoarthritis  03/27/2015  . PAD (peripheral artery disease) ([place]) 02/19/2015   S/p stent in left leg   . Paroxysmal atrial fibrillation (HCC)   . Presence of permanent cardiac pacemaker   . Pulmonary arterial hypertension ([place]), mild 03/11/2015  . Renal disorder   . Spinal cord compression due to degenerative disorder of spinal column ([place]) 03/01/2016  . Transaminasemia   . Type 2 diabetes mellitus without complication, with long-term current use of insulin ([place]) 02/24/2015  . Vitamin B12 deficiency 07/30/2016  . 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 BREAST LUMPECTOMY Left   . CARDIAC PACEMAKER PLACEMENT    . CATARACT EXTRACTION    . EYE SURGERY    . kidney stone removal     x3  . LAPAROSCOPIC NEPHRECTOMY Left 07/29/2015   Procedure: LEFT LAPAROSCOPIC SIMPLE NEPHRECTOMY;  Surgeon: Ardis Hughs, MD;  Location: [facility];  Service: Urology;  Laterality: Left;  . LUMBAR SPINE SURGERY     laminectomy and rod    Allergies as of 10/26/2016   No Known Allergies     Medication List       Accurate as of 10/26/16 11:59 PM. Always use your most recent med list.          acetaminophen 325 MG tablet Commonly known as:  TYLENOL Take 650 mg by mouth every 6 (six) hours as needed.   allopurinol 300 MG tablet Commonly known as:  ZYLOPRIM TAKE ONE TABLET BY MOUTH ONCE DAILY   amiodarone 200 MG tablet Commonly known as:  PACERONE Take 1 tablet (200 mg total) by mouth daily.   amLODipine 10  MG tablet Commonly known as:  NORVASC Take 10 mg by mouth daily.   apixaban 2.5 MG Tabs tablet Commonly known as:  ELIQUIS Take 2.5 mg by mouth 2 (two) times daily.   atorvastatin 80 MG tablet Commonly known as:  LIPITOR Take 1 tablet (80 mg total) by mouth daily with supper.   benazepril 20 MG tablet Commonly known as:  LOTENSIN Take 20 mg by mouth daily.   benzonatate 100 MG capsule Commonly known as:  TESSALON Take 1 capsule (100 mg total)  by mouth every 8 (eight) hours as needed for cough.   cyanocobalamin 500 MCG tablet Take 1,500 mcg by mouth daily with lunch. 3 tablets   donepezil 10 MG tablet Commonly known as:  ARICEPT Take 1 tablet (10 mg total) by mouth at bedtime.   feeding supplement (GLUCERNA SHAKE) Liqd Take 237 mLs by mouth 2 (two) times daily between meals.   furosemide 40 MG tablet Commonly known as:  LASIX Take 1.5 tablets (60 mg total) by mouth daily. Take furosemide 60 mg (1.5 tabs) by mouth for 3 days.  Then take furosemide 40 mg by mouth daily.   HUMALOG KWIKPEN 100 UNIT/ML KiwkPen Generic drug:  insulin lispro Inject 5 Units into the skin 3 (three) times daily before meals. When CBG is greater than 200; inject 8 units with a CBG greater than 300   TOUJEO SOLOSTAR 300 UNIT/ML Sopn Generic drug:  Insulin Glargine Inject 4 Units into the skin at bedtime.   WOMENS MULTI VITAMIN & MINERAL PO Take 1 tablet by mouth daily with lunch.       No orders of the defined types were placed in this encounter.   Immunization History  Administered Date(s) Administered  . Influenza, High Dose Seasonal PF 12/30/2015  . PPD Test 04/26/2015, 08/02/2015, 02/23/2016    Social History  Substance Use Topics  . Smoking status: Passive Smoke Exposure - Never Smoker  . Smokeless tobacco: Never Used     Comment: pt's spouse was heavy smoker, smoked in home.   . Alcohol use No    Review of Systems  DATA OBTAINED: from patient, nurse GENERAL:  no fevers, fatigue, appetite changes SKIN: No itching, rash HEENT: No complaint RESPIRATORY: No cough, wheezing, SOB CARDIAC: No chest pain, palpitations, lower extremity edema  GI: No abdominal pain, No N/V/D or constipation, No heartburn or reflux  GU: No dysuria, frequency or urgency, or incontinence  MUSCULOSKELETAL: No unrelieved bone/joint pain NEUROLOGIC: No headache, dizziness  PSYCHIATRIC: No overt anxiety or sadness  Vitals:   10/26/16 1029  BP: (!)  136/54  Pulse: (!) 56  Resp: 20  Temp: 97.6 F (36.4 C)   Body mass index is 29.1 kg/m. Physical Exam  GENERAL APPEARANCE: Alert, conversant, No acute distress  SKIN: No diaphoresis rash HEENT: Unremarkable RESPIRATORY: Breathing is even, unlabored. Lung sounds are clear   CARDIOVASCULAR: Heart RRR no murmurs, rubs or gallops. No peripheral edema  GASTROINTESTINAL: Abdomen is soft, non-tender, not distended w/ normal bowel sounds.  GENITOURINARY: Bladder non tender, not distended  MUSCULOSKELETAL: No abnormal joints or musculature NEUROLOGIC: Cranial nerves 2-12 grossly intact. Moves all extremities PSYCHIATRIC: Mood and affect With dementia, no behavioral issues  Patient Active Problem List   Diagnosis Date Noted  . Chronic systolic CHF (congestive heart failure) ([place]) 10/31/2016  . CAD (coronary artery disease) 10/31/2016  . Vitamin B12 deficiency 07/30/2016  . H/O colon cancer, stage I 07/03/2016  . Lung mass 04/21/2016  . Degenerative cervical spinal stenosis  03/01/2016  . Spinal cord compression due to degenerative disorder of spinal column ([place]) 03/01/2016  . Hypokalemia 03/01/2016  . Myelopathy ([place])   . Generalized weakness 02/19/2016  . Diaphragmatic injury as surgical complication 19/14/7829  . Postoperative anemia due to acute blood loss 08/14/2015  . Dementia without behavioral disturbance 08/14/2015  . Chronic UTI 07/29/2015  . 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 Klebsiella sepsis ([place]) 05/02/2015  . CAP (community acquired pneumonia) 05/02/2015  . Transaminasemia   . Sepsis ([place]) 04/22/2015  . Paroxysmal atrial fibrillation ([place]) 04/22/2015  . Delirium   . Urinary tract infectious disease   . Osteoarthritis 03/27/2015  . Cerebrovascular accident (CVA) due to thrombosis of cerebral artery ([place]) 03/23/2015  . Moderate tricuspid regurgitation 03/11/2015  . Dilated cardiomyopathy ([place]) 03/11/2015  . Mitral regurgitation 03/11/2015  . Pulmonary  arterial hypertension ([place]), mild 03/11/2015  . Mild cognitive impairment 02/24/2015  . Type 2 diabetes mellitus without complication, with long-term current use of insulin ([place]) 02/24/2015  . Hypertensive heart disease with CHF (congestive heart failure) ([place]) 02/24/2015  . Diabetes ([place]) 02/21/2015  . Essential hypertension, benign 02/21/2015  . Hyperlipidemia 02/19/2015  . Gout 02/19/2015  . PAD (peripheral artery disease) ([place]) 02/19/2015  . Cardiac pacemaker in situ 02/19/2015  . CKD (chronic kidney disease) 02/19/2015    CMP     Component Value Date/Time   NA 142 11/14/2016 1255   K 3.9 11/14/2016 1255   CL 98 11/14/2016 1255   CO2 24 11/14/2016 1255   GLUCOSE 199 (H) 11/14/2016 1255   GLUCOSE 139 (H) 02/23/2016 0605   BUN 25 11/14/2016 1255   CREATININE 1.16 (H) 11/14/2016 1255   CALCIUM 9.3 11/14/2016 1255   PROT 7.4 10/31/2016 1529   ALBUMIN 3.6 10/31/2016 1529   AST 29 10/31/2016 1529   ALT 33 (H) 10/31/2016 1529   ALKPHOS 179 (H) 10/31/2016 1529   BILITOT 0.4 10/31/2016 1529   GFRNONAA 44 (L) 11/14/2016 1255   GFRAA 50 (L) 11/14/2016 1255    Recent Labs  02/19/16 1633  02/23/16 0605  09/11/16 10/31/16 1529 11/14/16 1255  NA  --   < > 138  < > 141 142 142  K  --   < > 4.1  < > 4.3 3.7 3.9  CL  --   < > 101  --   --  96 98  CO2  --   < > 33*  --   --  27 24  GLUCOSE  --   < > 139*  --   --  324* 199*  BUN  --   < > 19  < > 25* 21 25  CREATININE  --   < > 1.12*  < > 1.2* 1.49* 1.16*  CALCIUM  --   < > 8.8*  --   --  9.1 9.3  MG 1.8  --   --   --   --   --   --   < > = values in this interval not displayed.  Recent Labs  09/11/16 10/31/16 1529  AST 20 29  ALT 17 33*  ALKPHOS 136* 179*  BILITOT  --  0.4  PROT  --  7.4  ALBUMIN  --  3.6    Recent Labs  02/19/16 1318  02/23/16 0605  04/03/16 09/11/16 10/31/16 1529  WBC 8.6  < > 7.4  < > 8.8 8.7 8.6  HGB 14.2  < > 12.3  < > 12.8 11.9* 12.4  HCT 41.7  < >  36.7  < > 41 37 39.3  MCV 90.7  --   92.0  --   --   --  92  PLT 218  < > 215  < > 228 193 282  < > = values in this interval not displayed.  Recent Labs  02/20/16 0440 04/03/16 09/11/16  CHOL 222* 234* 203*  LDLCALC 133* 135 121  TRIG 74 96 59   No results found for: [facility] Lab Results  Component Value Date   TSH 1.540 10/31/2016   Lab Results  Component Value Date   HGBA1C 8.6 09/11/2016   Lab Results  Component Value Date   CHOL 203 (A) 09/11/2016   HDL 70 09/11/2016   LDLCALC 121 09/11/2016   TRIG 59 09/11/2016   CHOLHDL 3.0 02/20/2016    Significant Diagnostic Results in last 30 days:  No results found.  Assessment and Plan  Cerebrovascular accident (CVA) due to thrombosis of cerebral artery (HCC) Stable and chronic; plan to continue Eliquis 2.5 mg by mouth twice a day  Hypertensive heart disease with CHF (congestive heart failure) ([place]) Well controlled; continue Norvasc 5 mg by mouth daily Lotensin 20 mg by mouth daily, Lasix 60 mg daily, and Coreg 3.125 mg by mouth twice a day  Type 2 diabetes mellitus without complication, with long-term current use of insulin (HCC) Most recent A1c is 8.6 which is stable from prior, not great but not harmful for age; continue Toujeo 40 units daily and NovoLog 5 units with every meal    Webb Silversmith D. Sheppard Coil, MD

## 2016-10-27 DIAGNOSIS — M6281 Muscle weakness (generalized): Secondary | ICD-10-CM | POA: Diagnosis not present

## 2016-10-27 DIAGNOSIS — I5022 Chronic systolic (congestive) heart failure: Secondary | ICD-10-CM | POA: Diagnosis not present

## 2016-10-30 DIAGNOSIS — M6281 Muscle weakness (generalized): Secondary | ICD-10-CM | POA: Diagnosis not present

## 2016-10-30 DIAGNOSIS — I5022 Chronic systolic (congestive) heart failure: Secondary | ICD-10-CM | POA: Diagnosis not present

## 2016-10-31 ENCOUNTER — Other Ambulatory Visit: Payer: Medicare Other

## 2016-10-31 ENCOUNTER — Encounter: Payer: Self-pay | Admitting: Physician Assistant

## 2016-10-31 ENCOUNTER — Ambulatory Visit (INDEPENDENT_AMBULATORY_CARE_PROVIDER_SITE_OTHER): Payer: Medicare Other | Admitting: Physician Assistant

## 2016-10-31 VITALS — BP 138/52 | HR 60 | Ht 60.0 in | Wt 138.1 lb

## 2016-10-31 DIAGNOSIS — Z950 Presence of cardiac pacemaker: Secondary | ICD-10-CM | POA: Diagnosis not present

## 2016-10-31 DIAGNOSIS — R918 Other nonspecific abnormal finding of lung field: Secondary | ICD-10-CM

## 2016-10-31 DIAGNOSIS — I2510 Atherosclerotic heart disease of native coronary artery without angina pectoris: Secondary | ICD-10-CM

## 2016-10-31 DIAGNOSIS — I340 Nonrheumatic mitral (valve) insufficiency: Secondary | ICD-10-CM

## 2016-10-31 DIAGNOSIS — I420 Dilated cardiomyopathy: Secondary | ICD-10-CM | POA: Diagnosis not present

## 2016-10-31 DIAGNOSIS — M6281 Muscle weakness (generalized): Secondary | ICD-10-CM | POA: Diagnosis not present

## 2016-10-31 DIAGNOSIS — I480 Paroxysmal atrial fibrillation: Secondary | ICD-10-CM | POA: Diagnosis not present

## 2016-10-31 DIAGNOSIS — I5022 Chronic systolic (congestive) heart failure: Secondary | ICD-10-CM

## 2016-10-31 DIAGNOSIS — I5023 Acute on chronic systolic (congestive) heart failure: Secondary | ICD-10-CM | POA: Insufficient documentation

## 2016-10-31 HISTORY — DX: Atherosclerotic heart disease of native coronary artery without angina pectoris: I25.10

## 2016-10-31 NOTE — Progress Notes (Signed)
Cardiology Office Note:    Date:  10/31/2016   ID:  Allison Dunn, DOB May 08, 1931, MRN [id number]  PCP:  Allison Duos, MD  Cardiologist:  Dr. Cristopher Dunn    Referring MD: Allison Duos, MD   Chief Complaint  Patient presents with  . Leg Swelling  . Shortness of Breath    History of Present Illness:    Allison Dunn is a 83 y.o. female with a hx of CAD s/p prior PTCA (in [place]), HTN, parox AF, sinus node dysfunction s/p Pacer, dilated cardiomyopathy, mitral regurgitation, systolic HF, diabetes, dementia.  The patient moved to [place] in late 2016.  She established with Dr. Cristopher Dunn in 1/17 for her pacemaker.  She was admitted in 1/18 with generalized weakness.  MRI did not show acute CVA.  There was noted cervical spinal stenosis.  Echo during that admission demonstrated EF 20-25.  She was last seen by Dr. Lovena Dunn in 02/2016 after her admission to the hospital. Since that time, she had a CT scan that demonstrated a lung mass and she was seen by Dr. Lake Dunn in Pulmonology.  The mass is suspicious for malignancy but the patient and her family wanted conservative management and did not want to pursue invasive diagnostic studies (i.e. bronchoscopy/biopsy).  The most recent CXR has demonstrated the R hilar mass has enlarged.  She is a resident at [facility].  She has recently been noted to have worsening Dunn edema, low O2, and shortness of breath.  She was placed on Lasix and asked to follow up in clinic.    [name] is here today with her daughter. She notes that her weight had increased to 148 pounds. When last seen in 1/18, her weight was 123 pounds. Her Dunn edema has improved with Lasix. Her weight today was 138 pounds. Her abdominal girth has decreased. She denies PND or syncope. She denies chest pain. She has dyspnea with just minimal activity. This is overall fairly stable. Her diet at the SNF does seem to be rich in salt.  Prior CV studies:   The following studies  were reviewed today:  Echocardiogram 02/21/16 Moderate LVH, EF 20-25, anterior akinesis, moderate to severe MR, moderate LAE, PASP 34  Carotid US 02/20/16 Bilateral ICA 1-39  Echocardiogram 03/09/15 EF 40-45, apical akinesis, grade 2 diastolic dysfunction, mild MAC, mild MR, mild LAE, moderate TR, PASP 44  Nuclear stress test 03/03/14 [facility]) Anteroseptal and apical infarct, no ischemia, EF 46  Echo 1/16 ([place]) EF 50, mild MR, mild TR  Past Medical History:  Diagnosis Date  . Arthritis   . Cancer [facility])    breast, left; s/p lumpectomy/chemo/radiation  . CAP (community acquired pneumonia) 05/02/2015  . Cardiac pacemaker in situ 02/19/2015  . Cerebrovascular accident (CVA) due to thrombosis of cerebral artery ([place]) 03/23/2015  . CKD (chronic kidney disease) 02/19/2015  . Degenerative cervical spinal stenosis 03/01/2016  . Dementia without behavioral disturbance 08/14/2015  . Depression   . Diabetes mellitus without complication ([place])   . Gout 02/19/2015  . H/O colon cancer, stage I 07/03/2016   s/p resection 2013 - colonic mucosa with high-grade dysplasia/adenocarcinoma in situ  . Hyperlipidemia   . Hypertension   . Hypertensive heart disease with CHF (congestive heart failure) ([place]) 02/24/2015  . Hypokalemia 03/01/2016  . Lung mass 04/21/2016  . Mild cognitive impairment 02/24/2015  . Mild mitral regurgitation 03/11/2015  . Moderate tricuspid regurgitation 03/11/2015   02/2015   . Osteoarthritis  03/27/2015  . PAD (peripheral artery disease) ([place]) 02/19/2015   S/p stent in left leg   . Paroxysmal atrial fibrillation (HCC)   . Presence of permanent cardiac pacemaker   . Pulmonary arterial hypertension ([place]), mild 03/11/2015  . Renal disorder   . Spinal cord compression due to degenerative disorder of spinal column ([place]) 03/01/2016  . Transaminasemia   . Type 2 diabetes mellitus without complication, with long-term current use of insulin ([place]) 02/24/2015  . Vitamin B12 deficiency  07/30/2016  . 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 BREAST LUMPECTOMY Left   . CARDIAC PACEMAKER PLACEMENT    . CATARACT EXTRACTION    . EYE SURGERY    . kidney stone removal     x3  . LAPAROSCOPIC NEPHRECTOMY Left 07/29/2015   Procedure: LEFT LAPAROSCOPIC SIMPLE NEPHRECTOMY;  Surgeon: Ardis Hughs, MD;  Location: [facility];  Service: Urology;  Laterality: Left;  . LUMBAR SPINE SURGERY     laminectomy and rod    Current Medications: Current Meds  Medication Sig  . acetaminophen (TYLENOL) 325 MG tablet Take 650 mg by mouth every 6 (six) hours as needed.  Marland Kitchen allopurinol (ZYLOPRIM) 300 MG tablet TAKE ONE TABLET BY MOUTH ONCE DAILY  . amiodarone (PACERONE) 200 MG tablet Take 1 tablet (200 mg total) by mouth daily.  Marland Kitchen amLODipine (NORVASC) 10 MG tablet Take 10 mg by mouth daily.  Marland Kitchen apixaban (ELIQUIS) 2.5 MG TABS tablet Take 2.5 mg by mouth 2 (two) times daily.  Marland Kitchen atorvastatin (LIPITOR) 80 MG tablet Take 1 tablet (80 mg total) by mouth daily with supper.  . benazepril (LOTENSIN) 20 MG tablet Take 20 mg by mouth daily.   . benzonatate (TESSALON) 100 MG capsule Take 1 capsule (100 mg total) by mouth every 8 (eight) hours as needed for cough.  . cyanocobalamin 500 MCG tablet Take 1,500 mcg by mouth daily with lunch. 3 tablets  . donepezil (ARICEPT) 10 MG tablet Take 1 tablet (10 mg total) by mouth at bedtime.  . feeding supplement, GLUCERNA SHAKE, (GLUCERNA SHAKE) LIQD Take 237 mLs by mouth 2 (two) times daily between meals.  . furosemide (LASIX) 40 MG tablet Take 1.5 tablets (60 mg total) by mouth daily. Take furosemide 60 mg (1.5 tabs) by mouth for 3 days.  Then take furosemide 40 mg by mouth daily.  Marland Kitchen HUMALOG KWIKPEN 100 UNIT/ML KiwkPen Inject 5 Units into the skin 3 (three) times daily before meals. When CBG is greater than 200; inject 8 units with a CBG greater than 300  . Insulin Glargine (TOUJEO SOLOSTAR) 300 UNIT/ML SOPN  Inject 4 Units into the skin at bedtime.  . meclizine (ANTIVERT) 12.5 MG tablet Take 12.5 mg by mouth 3 (three) times daily as needed for dizziness or nausea.   . Multiple Vitamins-Minerals (WOMENS MULTI VITAMIN & MINERAL PO) Take 1 tablet by mouth daily with lunch.      Allergies:   Patient has no known allergies.   Social History   Social History  . Marital status: Widowed    Spouse name: N/A  . Number of children: 0  . Years of education: N/A   Occupational History  . retired [employer]    Social History Main Topics  . Smoking status: Passive Smoke Exposure - Never Smoker  . Smokeless tobacco: Never Used     Comment: pt's spouse was heavy smoker, smoked in home.   . Alcohol use No  . Drug use:  No  . Sexual activity: No   Other Topics Concern  . None   Social History Narrative   Admitted to [facility] 02/23/16   Widowed   Never smoked   Alcohol none   DNR     Family Hx: The patient's family history includes Asthma in her sister; Cancer in her mother; Diabetes in her sister; Hypertension in her brother and sister.  ROS:   Please see the history of present illness.    ROS Dunn other systems reviewed and are negative.   EKGs/Labs/Other Test Reviewed:    EKG:  EKG is  ordered today.  The ekg ordered today demonstrates Atrial paced, HR 59, anteroseptal Q waves, QTc 405  Recent Labs: 02/19/2016: Magnesium 1.8 09/11/2016: ALT 17; BUN 25; Creatinine 1.2; Hemoglobin 11.9; Platelets 193; Potassium 4.3; Sodium 141; TSH 1.67   Recent Lipid Panel Lab Results  Component Value Date/Time   CHOL 203 (A) 09/11/2016   TRIG 59 09/11/2016   HDL 70 09/11/2016   CHOLHDL 3.0 02/20/2016 04:40 AM   LDLCALC 121 09/11/2016    Physical Exam:    VS:  BP (!) 138/52   Pulse 60   Ht 5' (1.524 m)   Wt 138 lb 1.9 oz (62.7 kg)   BMI 26.97 kg/m     Wt Readings from Last 3 Encounters:  10/31/16 138 lb 1.9 oz (62.7 kg)  10/26/16 149 lb (67.6 kg)  10/06/16 144 lb 3.2 oz  (65.4 kg)     Physical Exam  Constitutional: She is oriented to person, place, and time. She appears well-developed and well-nourished. No distress.  HENT:  Head: Normocephalic and atraumatic.  Eyes: No scleral icterus.  Neck: JVD (up to jaw) present.  Cardiovascular: Normal rate and regular rhythm.   No murmur heard. Pulmonary/Chest: She has no wheezes. She has no rales.  Abdominal: Soft. She exhibits no distension.  Musculoskeletal: She exhibits edema (tight 2+ edema up to her knees).  Neurological: She is alert and oriented to person, place, and time.  Skin: Skin is warm and dry.  Psychiatric: She has a normal mood and affect.    ASSESSMENT:    1. Chronic systolic CHF (congestive heart failure) ([place])   2. Dilated cardiomyopathy ([place])   3. Coronary artery disease involving native coronary artery of native heart without angina pectoris   4. Paroxysmal atrial fibrillation (HCC)   5. Mitral valve insufficiency, unspecified etiology   6. Cardiac pacemaker in situ   7. Lung mass    PLAN:    In order of problems listed above:  1. Chronic systolic CHF (congestive heart failure) (HCC) She has evidence of volume excess on exam. She mainly has significant Dunn edema. Her abdomen is soft and her lungs are clear. JVP is up to her jaw. Her weight has decreased 10 pounds with initiation of Lasix. She does have probable underlying malignancy given the mass on her CT and chest x-ray. LFTs will be obtained to rule out hypoalbuminemia. She is on amiodarone and apixaban. CBC and TSH will be obtained as well to rule out anemia, thyrotoxicosis from amiodarone. I doubt that her Amlodipine is the cause of her edema as she has been on this for years.  She is on ACE inhibitor therapy. As her volume stabilizes, consider adding beta blocker therapy.  -  Labs: BMET, LFTs, CBC, TSH, BNP  -  Continue Lasix 40 mg QD  -  Consider beta-blocker over time  -  Low salt diet  -  Close FU in 1-2 weeks.    2.  Dilated cardiomyopathy ([place]) Suspect DCM is an ischemic CM given wall motion abnormalities on echocardiogram.  EF has decreased since 2017.  EF was 20-25 on echocardiogram in 02/2016.  She is not a candidate for ICD given advanced age, comorbid illnesses and probable malignancy that is being managed conservatively.    3. Coronary artery disease involving native coronary artery of native heart without angina pectoris  Records not complete from when she lived in [place].  Nuclear stress test in 2016 demonstrated scar and no ischemia.  Indications for the test include prior PTCA.   She has wall motion abnormalities on echo.  She is not on ASA as she is on Apixaban.  Continue statin.  4. Paroxysmal atrial fibrillation (HCC)  Maintaining NSR.  Wt > 60 kg, age > 28.  She is on the appropriate dose of Apixaban at 2.5 mg bid.  Check BMET, CBC.  5. Mitral valve insufficiency, unspecified etiology Mod to severe MR by echo in 1/18.  She is not a candidate for surgery  6. Cardiac pacemaker in situ FU with EP as planned.  7. Lung mass Suspected malignancy.  She is being managed conservatively and no invasive testing is planned.      Dispo:  Return in about 2 weeks (around 11/14/2016) for Close Follow Up, w/ Richardson Dopp, PA-C.   Medication Adjustments/Labs and Tests Ordered: Current medicines are reviewed at length with the patient today.  Concerns regarding medicines are outlined above.  Tests Ordered: Orders Placed This Encounter  Procedures  . Basic Metabolic Panel (BMET)  . CBC  . TSH  . Hepatic function panel  . Pro b natriuretic peptide  . EKG 12-Lead   Medication Changes: No orders of the defined types were placed in this encounter.   Signed, Richardson Dopp, PA-C  10/31/2016 3:34 PM    [facility] [address], [place], [place]  [address] Phone: [phone]; Fax: [phone]

## 2016-10-31 NOTE — Patient Instructions (Addendum)
Medication Instructions:  1. Your physician recommends that you continue on your current medications as directed. Please refer to the Current Medication list given to you today.   Labwork: TODAY BMET, CBC, TSH, LFT, PRO BNP  Testing/Procedures: NONE ORDERED TODAY  Follow-Up: 11/14/16 @ 11:45 WITH SCOTT WEAVER, PAC   Any Other Special Instructions Will Be Listed Below (If Applicable). If you need a refill on your cardiac medications before your next appointment, please call your pharmacy.   Low-Sodium Eating Plan Sodium, which is an element that makes up salt, helps you maintain a healthy balance of fluids in your body. Too much sodium can increase your blood pressure and cause fluid and waste to be held in your body. Your health care provider or dietitian may recommend following this plan if you have high blood pressure (hypertension), kidney disease, liver disease, or heart failure. Eating less sodium can help lower your blood pressure, reduce swelling, and protect your heart, liver, and kidneys. What are tips for following this plan? General guidelines  Most people on this plan should limit their sodium intake to 1,500-2,000 mg (milligrams) of sodium each day. Reading food labels  The Nutrition Facts label lists the amount of sodium in one serving of the food. If you eat more than one serving, you must multiply the listed amount of sodium by the number of servings.  Choose foods with less than 140 mg of sodium per serving.  Avoid foods with 300 mg of sodium or more per serving. Shopping  Look for lower-sodium products, often labeled as "low-sodium" or "no salt added."  Always check the sodium content even if foods are labeled as "unsalted" or "no salt added".  Buy fresh foods. ? Avoid canned foods and premade or frozen meals. ? Avoid canned, cured, or processed meats  Buy breads that have less than 80 mg of sodium per slice. Cooking  Eat more home-cooked food and less  restaurant, buffet, and fast food.  Avoid adding salt when cooking. Use salt-free seasonings or herbs instead of table salt or sea salt. Check with your health care provider or pharmacist before using salt substitutes.  Cook with plant-based oils, such as canola, sunflower, or olive oil. Meal planning  When eating at a restaurant, ask that your food be prepared with less salt or no salt, if possible.  Avoid foods that contain MSG (monosodium glutamate). MSG is sometimes added to [place] food, bouillon, and some canned foods. What foods are recommended? The items listed may not be a complete list. Talk with your dietitian about what dietary choices are best for you. Grains Low-sodium cereals, including oats, puffed wheat and rice, and shredded wheat. Low-sodium crackers. Unsalted rice. Unsalted pasta. Low-sodium bread. Whole-grain breads and whole-grain pasta. Vegetables Fresh or frozen vegetables. "No salt added" canned vegetables. "No salt added" tomato sauce and paste. Low-sodium or reduced-sodium tomato and vegetable juice. Fruits Fresh, frozen, or canned fruit. Fruit juice. Meats and other protein foods Fresh or frozen (no salt added) meat, poultry, seafood, and fish. Low-sodium canned tuna and salmon. Unsalted nuts. Dried peas, beans, and lentils without added salt. Unsalted canned beans. Eggs. Unsalted nut butters. Dairy Milk. Soy milk. Cheese that is naturally low in sodium, such as ricotta cheese, fresh mozzarella, or Swiss cheese Low-sodium or reduced-sodium cheese. Cream cheese. Yogurt. Fats and oils Unsalted butter. Unsalted margarine with no trans fat. Vegetable oils such as canola or olive oils. Seasonings and other foods Fresh and dried herbs and spices. Salt-free seasonings. Low-sodium mustard  and ketchup. Sodium-free salad dressing. Sodium-free light mayonnaise. Fresh or refrigerated horseradish. Lemon juice. Vinegar. Homemade, reduced-sodium, or low-sodium soups. Unsalted  popcorn and pretzels. Low-salt or salt-free chips. What foods are not recommended? The items listed may not be a complete list. Talk with your dietitian about what dietary choices are best for you. Grains Instant hot cereals. Bread stuffing, pancake, and biscuit mixes. Croutons. Seasoned rice or pasta mixes. Noodle soup cups. Boxed or frozen macaroni and cheese. Regular salted crackers. Self-rising flour. Vegetables Sauerkraut, pickled vegetables, and relishes. Olives. [place] fries. Onion rings. Regular canned vegetables (not low-sodium or reduced-sodium). Regular canned tomato sauce and paste (not low-sodium or reduced-sodium). Regular tomato and vegetable juice (not low-sodium or reduced-sodium). Frozen vegetables in sauces. Meats and other protein foods Meat or fish that is salted, canned, smoked, spiced, or pickled. Bacon, ham, sausage, hotdogs, corned beef, chipped beef, packaged lunch meats, salt pork, jerky, pickled herring, anchovies, regular canned tuna, sardines, salted nuts. Dairy Processed cheese and cheese spreads. Cheese curds. Blue cheese. Feta cheese. String cheese. Regular cottage cheese. Buttermilk. Canned milk. Fats and oils Salted butter. Regular margarine. Ghee. Bacon fat. Seasonings and other foods Onion salt, garlic salt, seasoned salt, table salt, and sea salt. Canned and packaged gravies. Worcestershire sauce. Tartar sauce. Barbecue sauce. Teriyaki sauce. Soy sauce, including reduced-sodium. Steak sauce. Fish sauce. Oyster sauce. Cocktail sauce. Horseradish that you find on the shelf. Regular ketchup and mustard. Meat flavorings and tenderizers. Bouillon cubes. Hot sauce and Tabasco sauce. Premade or packaged marinades. Premade or packaged taco seasonings. Relishes. Regular salad dressings. Salsa. Potato and tortilla chips. Corn chips and puffs. Salted popcorn and pretzels. Canned or dried soups. Pizza. Frozen entrees and pot pies. Summary  Eating less sodium can help lower  your blood pressure, reduce swelling, and protect your heart, liver, and kidneys.  Most people on this plan should limit their sodium intake to 1,500-2,000 mg (milligrams) of sodium each day.  Canned, boxed, and frozen foods are high in sodium. Restaurant foods, fast foods, and pizza are also very high in sodium. You also get sodium by adding salt to food.  Try to cook at home, eat more fresh fruits and vegetables, and eat less fast food, canned, processed, or prepared foods. This information is not intended to replace advice given to you by your health care provider. Make sure you discuss any questions you have with your health care provider. Document Released: 07/29/2001 Document Revised: 01/31/2016 Document Reviewed: 01/31/2016 Elsevier Interactive Patient Education  2017 [employer].

## 2016-11-01 ENCOUNTER — Telehealth: Payer: Self-pay | Admitting: *Deleted

## 2016-11-01 DIAGNOSIS — I5022 Chronic systolic (congestive) heart failure: Secondary | ICD-10-CM | POA: Diagnosis not present

## 2016-11-01 DIAGNOSIS — M6281 Muscle weakness (generalized): Secondary | ICD-10-CM | POA: Diagnosis not present

## 2016-11-01 LAB — CBC
HEMATOCRIT: 39.3 % (ref 34.0–46.6)
HEMOGLOBIN: 12.4 g/dL (ref 11.1–15.9)
MCH: 29 pg (ref 26.6–33.0)
MCHC: 31.6 g/dL (ref 31.5–35.7)
MCV: 92 fL (ref 79–97)
Platelets: 282 10*3/uL (ref 150–379)
RBC: 4.27 x10E6/uL (ref 3.77–5.28)
RDW: 17.3 % — ABNORMAL HIGH (ref 12.3–15.4)
WBC: 8.6 10*3/uL (ref 3.4–10.8)

## 2016-11-01 LAB — BASIC METABOLIC PANEL
BUN/Creatinine Ratio: 14 (ref 12–28)
BUN: 21 mg/dL (ref 8–27)
CALCIUM: 9.1 mg/dL (ref 8.7–10.3)
CO2: 27 mmol/L (ref 20–29)
Chloride: 96 mmol/L (ref 96–106)
Creatinine, Ser: 1.49 mg/dL — ABNORMAL HIGH (ref 0.57–1.00)
GFR, EST AFRICAN AMERICAN: 37 mL/min/{1.73_m2} — AB (ref >59)
GFR, EST NON AFRICAN AMERICAN: 32 mL/min/{1.73_m2} — AB (ref >59)
Glucose: 324 mg/dL — ABNORMAL HIGH (ref 65–99)
POTASSIUM: 3.7 mmol/L (ref 3.5–5.2)
Sodium: 142 mmol/L (ref 134–144)

## 2016-11-01 LAB — HEPATIC FUNCTION PANEL
ALK PHOS: 179 IU/L — AB (ref 39–117)
ALT: 33 IU/L — AB (ref 0–32)
AST: 29 IU/L (ref 0–40)
Albumin: 3.6 g/dL (ref 3.5–4.7)
BILIRUBIN TOTAL: 0.4 mg/dL (ref 0.0–1.2)
BILIRUBIN, DIRECT: 0.14 mg/dL (ref 0.00–0.40)
TOTAL PROTEIN: 7.4 g/dL (ref 6.0–8.5)

## 2016-11-01 LAB — PRO B NATRIURETIC PEPTIDE: NT-Pro BNP: 2831 pg/mL — ABNORMAL HIGH (ref 0–738)

## 2016-11-01 LAB — TSH: TSH: 1.54 u[IU]/mL (ref 0.450–4.500)

## 2016-11-01 NOTE — Telephone Encounter (Signed)
I s/w Lauren, RN at [facility] in regards to lab results for pt. Advised repeat BMET to be done in 1 week. Advised pt will need to f/u with PCP in regards to elevated glucose and ALP elevated. No changes with her current Tx plan at this time. Lauren, RN verbalized understanding to plan of care for pt. I will fax results wit recommendations to fax # [phone]. If results from BMET that will be done in 1 week could be faxed to Liliane Shi, [place]. ------

## 2016-11-01 NOTE — Telephone Encounter (Signed)
-----   Message from Liliane Shi, [place] sent at 11/01/2016  1:13 PM EDT ----- Please call the patient Glucose is too high; Renal function is fairly stable Hemoglobin, TSH, protein levels normal Alkaline phosphatase elevated Please arrange follow-up with primary care for elevated sugar and alkaline phosphatase Repeat BMET 1 week. Otherwise continue current medications and follow-up as planned Richardson Dopp, PA-C    11/01/2016 1:12 PM

## 2016-11-02 DIAGNOSIS — I5022 Chronic systolic (congestive) heart failure: Secondary | ICD-10-CM | POA: Diagnosis not present

## 2016-11-02 DIAGNOSIS — M6281 Muscle weakness (generalized): Secondary | ICD-10-CM | POA: Diagnosis not present

## 2016-11-03 DIAGNOSIS — I5022 Chronic systolic (congestive) heart failure: Secondary | ICD-10-CM | POA: Diagnosis not present

## 2016-11-03 DIAGNOSIS — M6281 Muscle weakness (generalized): Secondary | ICD-10-CM | POA: Diagnosis not present

## 2016-11-04 NOTE — Progress Notes (Signed)
This encounter was created in error - please disregard.

## 2016-11-06 DIAGNOSIS — I5022 Chronic systolic (congestive) heart failure: Secondary | ICD-10-CM | POA: Diagnosis not present

## 2016-11-06 DIAGNOSIS — M6281 Muscle weakness (generalized): Secondary | ICD-10-CM | POA: Diagnosis not present

## 2016-11-07 DIAGNOSIS — M6281 Muscle weakness (generalized): Secondary | ICD-10-CM | POA: Diagnosis not present

## 2016-11-07 DIAGNOSIS — I5022 Chronic systolic (congestive) heart failure: Secondary | ICD-10-CM | POA: Diagnosis not present

## 2016-11-08 DIAGNOSIS — I5022 Chronic systolic (congestive) heart failure: Secondary | ICD-10-CM | POA: Diagnosis not present

## 2016-11-08 DIAGNOSIS — M6281 Muscle weakness (generalized): Secondary | ICD-10-CM | POA: Diagnosis not present

## 2016-11-08 DIAGNOSIS — D649 Anemia, unspecified: Secondary | ICD-10-CM | POA: Diagnosis not present

## 2016-11-09 DIAGNOSIS — I5022 Chronic systolic (congestive) heart failure: Secondary | ICD-10-CM | POA: Diagnosis not present

## 2016-11-09 DIAGNOSIS — M6281 Muscle weakness (generalized): Secondary | ICD-10-CM | POA: Diagnosis not present

## 2016-11-10 DIAGNOSIS — M6281 Muscle weakness (generalized): Secondary | ICD-10-CM | POA: Diagnosis not present

## 2016-11-10 DIAGNOSIS — I5022 Chronic systolic (congestive) heart failure: Secondary | ICD-10-CM | POA: Diagnosis not present

## 2016-11-11 DIAGNOSIS — I5022 Chronic systolic (congestive) heart failure: Secondary | ICD-10-CM | POA: Diagnosis not present

## 2016-11-11 DIAGNOSIS — M6281 Muscle weakness (generalized): Secondary | ICD-10-CM | POA: Diagnosis not present

## 2016-11-14 ENCOUNTER — Ambulatory Visit (INDEPENDENT_AMBULATORY_CARE_PROVIDER_SITE_OTHER): Payer: Medicare Other | Admitting: Physician Assistant

## 2016-11-14 ENCOUNTER — Encounter: Payer: Self-pay | Admitting: Physician Assistant

## 2016-11-14 VITALS — BP 128/50 | HR 60 | Ht 61.0 in | Wt 133.0 lb

## 2016-11-14 DIAGNOSIS — I5022 Chronic systolic (congestive) heart failure: Secondary | ICD-10-CM | POA: Diagnosis not present

## 2016-11-14 DIAGNOSIS — Z950 Presence of cardiac pacemaker: Secondary | ICD-10-CM | POA: Diagnosis not present

## 2016-11-14 DIAGNOSIS — I340 Nonrheumatic mitral (valve) insufficiency: Secondary | ICD-10-CM

## 2016-11-14 DIAGNOSIS — I5023 Acute on chronic systolic (congestive) heart failure: Secondary | ICD-10-CM | POA: Diagnosis not present

## 2016-11-14 DIAGNOSIS — I480 Paroxysmal atrial fibrillation: Secondary | ICD-10-CM | POA: Diagnosis not present

## 2016-11-14 DIAGNOSIS — I420 Dilated cardiomyopathy: Secondary | ICD-10-CM

## 2016-11-14 DIAGNOSIS — I2510 Atherosclerotic heart disease of native coronary artery without angina pectoris: Secondary | ICD-10-CM | POA: Diagnosis not present

## 2016-11-14 DIAGNOSIS — R918 Other nonspecific abnormal finding of lung field: Secondary | ICD-10-CM

## 2016-11-14 DIAGNOSIS — M6281 Muscle weakness (generalized): Secondary | ICD-10-CM | POA: Diagnosis not present

## 2016-11-14 MED ORDER — CARVEDILOL 3.125 MG PO TABS: 3.1250 mg | ORAL_TABLET | Freq: Two times a day (BID) | ORAL | 11 refills | Status: AC

## 2016-11-14 MED ORDER — AMLODIPINE BESYLATE 5 MG PO TABS: 5.0000 mg | ORAL_TABLET | Freq: Every day | ORAL | 11 refills | Status: DC

## 2016-11-14 NOTE — Progress Notes (Signed)
Cardiology Office Note:    Date:  11/14/2016   ID:  NABRIA NEVIN, DOB June 04, 1931, MRN [id number]  PCP:  Hennie Duos, MD  Cardiologist:  Dr. Cristopher Peru    Referring MD: Hennie Duos, MD   Chief Complaint  Patient presents with  . Congestive Heart Failure    Follow-up    History of Present Illness:    Allison Dunn is a 83 y.o. female with a hx of CAD s/p prior PTCA (in [place]), HTN, parox AF, sinus node dysfunction s/p Pacer, dilated cardiomyopathy, mitral regurgitation, systolic HF, diabetes, dementia.  The patient moved to [place] in late 2016.  She established with Dr. Cristopher Peru in 1/17 for her pacemaker.  She was admitted in 1/18 with generalized weakness.  MRI did not show acute CVA.  There was noted cervical spinal stenosis.  Echo during that admission demonstrated EF 20-25.  She was last seen by Dr. Lovena Le in 02/2016 after her admission to the hospital. Since that time, she had a CT scan that demonstrated a lung mass and she was seen by Dr. Lake Bells in Pulmonology.  The mass is suspicious for malignancy but the patient and her family wanted conservative management and did not want to pursue invasive diagnostic studies (i.e. bronchoscopy/biopsy).  The most recent CXR has demonstrated the R hilar mass has enlarged.  She is a resident at [facility].  I saw her 2 weeks ago after being noted to have worsening LE edema, low O2, and shortness of breath.  She was placed on Lasix at the SNF.  When I saw her, she was improved and her weight had already decreased 10 lbs.  Labs indicated stable renal function and pro-BNP was >2K.    Allison Dunn returns for close follow up on congestive heart failure.  She is here today with her daughter. Her edema continues to improve. Her weight is down another 5 pounds. She continues to feel that her breathing has improved. She sleeps on an incline but denies PND. She denies chest discomfort. She denies syncope.  Prior CV studies:   The  following studies were reviewed today:  Echocardiogram 02/21/16 Moderate LVH, EF 20-25, anterior akinesis, moderate to severe MR, moderate LAE, PASP 34   Carotid US 02/20/16 Bilateral ICA 1-39   Echocardiogram 03/09/15 EF 40-45, apical akinesis, grade 2 diastolic dysfunction, mild MAC, mild MR, mild LAE, moderate TR, PASP 44   Nuclear stress test 03/03/14 [facility]) Anteroseptal and apical infarct, no ischemia, EF 46   Echo 1/16 ([place]) EF 50, mild MR, mild TR   Past Medical History:  Diagnosis Date  . Arthritis   . Cancer [facility])    breast, left; s/p lumpectomy/chemo/radiation  . CAP (community acquired pneumonia) 05/02/2015  . Cardiac pacemaker in situ 02/19/2015  . Cerebrovascular accident (CVA) due to thrombosis of cerebral artery ([place]) 03/23/2015  . CKD (chronic kidney disease) 02/19/2015  . Degenerative cervical spinal stenosis 03/01/2016  . Dementia without behavioral disturbance 08/14/2015  . Depression   . Diabetes mellitus without complication ([place])   . Gout 02/19/2015  . H/O colon cancer, stage I 07/03/2016   s/p resection 2013 - colonic mucosa with high-grade dysplasia/adenocarcinoma in situ  . Hyperlipidemia   . Hypertension   . Hypertensive heart disease with CHF (congestive heart failure) ([place]) 02/24/2015  . Hypokalemia 03/01/2016  . Lung mass 04/21/2016  . Mild cognitive impairment 02/24/2015  . Mild mitral regurgitation 03/11/2015  . Moderate tricuspid regurgitation 03/11/2015  02/2015   . Osteoarthritis 03/27/2015  . PAD (peripheral artery disease) ([place]) 02/19/2015   S/p stent in left leg   . Paroxysmal atrial fibrillation (HCC)   . Presence of permanent cardiac pacemaker   . Pulmonary arterial hypertension ([place]), mild 03/11/2015  . Renal disorder   . Spinal cord compression due to degenerative disorder of spinal column ([employer]) 03/01/2016  . Transaminasemia   . Type 2 diabetes mellitus without complication, with long-term current use of insulin ([place]) 02/24/2015  .  Vitamin B12 deficiency 07/30/2016  . 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 BREAST LUMPECTOMY Left   . CARDIAC PACEMAKER PLACEMENT    . CATARACT EXTRACTION    . EYE SURGERY    . kidney stone removal     x3  . LAPAROSCOPIC NEPHRECTOMY Left 07/29/2015   Procedure: LEFT LAPAROSCOPIC SIMPLE NEPHRECTOMY;  Surgeon: Ardis Hughs, MD;  Location: [facility];  Service: Urology;  Laterality: Left;  . LUMBAR SPINE SURGERY     laminectomy and rod    Current Medications: Current Meds  Medication Sig  . acetaminophen (TYLENOL) 325 MG tablet Take 650 mg by mouth every 6 (six) hours as needed.  Marland Kitchen allopurinol (ZYLOPRIM) 300 MG tablet TAKE ONE TABLET BY MOUTH ONCE DAILY  . amiodarone (PACERONE) 200 MG tablet Take 1 tablet (200 mg total) by mouth daily.  Marland Kitchen apixaban (ELIQUIS) 2.5 MG TABS tablet Take 2.5 mg by mouth 2 (two) times daily.  Marland Kitchen atorvastatin (LIPITOR) 80 MG tablet Take 1 tablet (80 mg total) by mouth daily with supper.  . benazepril (LOTENSIN) 20 MG tablet Take 20 mg by mouth daily.   . benzonatate (TESSALON) 100 MG capsule Take 1 capsule (100 mg total) by mouth every 8 (eight) hours as needed for cough.  . cyanocobalamin 500 MCG tablet Take 1,500 mcg by mouth daily with lunch. 3 tablets  . donepezil (ARICEPT) 10 MG tablet Take 1 tablet (10 mg total) by mouth at bedtime.  . feeding supplement, GLUCERNA SHAKE, (GLUCERNA SHAKE) LIQD Take 237 mLs by mouth 2 (two) times daily between meals.  . furosemide (LASIX) 40 MG tablet Take 1.5 tablets (60 mg total) by mouth daily. Take furosemide 60 mg (1.5 tabs) by mouth for 3 days.  Then take furosemide 40 mg by mouth daily.  Marland Kitchen HUMALOG KWIKPEN 100 UNIT/ML KiwkPen Inject 5 Units into the skin 3 (three) times daily before meals. When CBG is greater than 200; inject 8 units with a CBG greater than 300  . Insulin Glargine (TOUJEO SOLOSTAR) 300 UNIT/ML SOPN Inject 4 Units into the skin at bedtime.  .  meclizine (ANTIVERT) 12.5 MG tablet Take 12.5 mg by mouth 3 (three) times daily as needed for dizziness or nausea.   . Multiple Vitamins-Minerals (WOMENS MULTI VITAMIN & MINERAL PO) Take 1 tablet by mouth daily with lunch.   . [DISCONTINUED] amLODipine (NORVASC) 10 MG tablet Take 10 mg by mouth daily.     Allergies:   Patient has no known allergies.   Social History   Social History  . Marital status: Widowed    Spouse name: N/A  . Number of children: 0  . Years of education: N/A   Occupational History  . retired [employer]    Social History Main Topics  . Smoking status: Passive Smoke Exposure - Never Smoker  . Smokeless tobacco: Never Used     Comment: pt's spouse was heavy smoker, smoked in home.   . Alcohol  use No  . Drug use: No  . Sexual activity: No   Other Topics Concern  . None   Social History Narrative   Admitted to [place] 02/23/16   Widowed   Never smoked   Alcohol none   DNR     Family Hx: The patient's family history includes Asthma in her sister; Cancer in her mother; Diabetes in her sister; Hypertension in her brother and sister.  ROS:   Please see the history of present illness.    ROS All other systems reviewed and are negative.   EKGs/Labs/Other Test Reviewed:    EKG:  EKG is not ordered today.  The ekg ordered today demonstrates n/a  Recent Labs: 02/19/2016: Magnesium 1.8 10/31/2016: ALT 33; BUN 21; Creatinine, Ser 1.49; Hemoglobin 12.4; NT-Pro BNP 2,831; Platelets 282; Potassium 3.7; Sodium 142; TSH 1.540   Recent Lipid Panel Lab Results  Component Value Date/Time   CHOL 203 (A) 09/11/2016   TRIG 59 09/11/2016   HDL 70 09/11/2016   CHOLHDL 3.0 02/20/2016 04:40 AM   LDLCALC 121 09/11/2016    Physical Exam:    VS:  BP (!) 128/50   Pulse 60   Ht _0  (1.549 m)   Wt 133 lb (60.3 kg)   BMI 25.13 kg/m     Wt Readings from Last 3 Encounters:  11/14/16 133 lb (60.3 kg)  10/31/16 138 lb 1.9 oz (62.7 kg)  10/26/16 149 lb  (67.6 kg)     Physical Exam  Constitutional: She is oriented to person, place, and time. She appears well-developed and well-nourished. No distress.  HENT:  Head: Normocephalic and atraumatic.  Eyes: No scleral icterus.  Neck: JVD present.  Cardiovascular: Normal rate and regular rhythm.   No murmur heard. Pulmonary/Chest: Effort normal. She has no rales.  Abdominal: Soft. There is no tenderness.  Musculoskeletal: She exhibits edema (tight 2+ bilateral edema).  Neurological: She is alert and oriented to person, place, and time.  Skin: Skin is warm and dry.  Psychiatric: She has a normal mood and affect.    ASSESSMENT:    1. Acute on chronic systolic (congestive) heart failure ([place])   2. Dilated cardiomyopathy ([place])   3. Coronary artery disease involving native coronary artery of native heart without angina pectoris   4. Paroxysmal atrial fibrillation (HCC)   5. Mitral valve insufficiency, unspecified etiology   6. Cardiac pacemaker in situ   7. Lung mass    PLAN:    In order of problems listed above:  1. Acute on chronic systolic (congestive) heart failure (HCC)  Volume continues to improve. Weight is down 15 lbs since Lasix started.  Her legs remain significantly swollen. Question if amlodipine may be playing somewhat of a role. She is already on ACE inhibitor therapy. Given her cardiomyopathy, she should be on a beta blocker as well. As her volume continues to improve, I have elected to continue her current dose of furosemide.  -  Continue benazepril  -  Decrease amlodipine to 5 mg daily  -  Start carvedilol 3.125 mg twice a day  -  Follow-up BMET today  -  Will ask SNF to monitor blood pressure with change in medication  2. Dilated cardiomyopathy ([place]) As noted previously, she is not a candidate for ICD given advanced age and comorbid illnesses as well as probable malignancy that is being managed conservatively.  3. Coronary artery disease involving native coronary  artery of native heart without angina pectoris  She denies angina.  4. Paroxysmal atrial fibrillation (HCC) Continue Apixaban, amiodarone. TSH and ALT were okay by recent lab work  5. Mitral valve insufficiency, unspecified etiology Moderate to severe by echo in 1/18. She is not a candidate for valve replacement.  6. Cardiac pacemaker in situ Follow-up with EP as planned.  7. Lung mass Presumed malignancy. She has opted for conservative management.  Continue follow-up with pulmonology.   Dispo:  Return in about 6 weeks (around 12/26/2016) for Close Follow Up w/ Dr. Lovena Le.   Medication Adjustments/Labs and Tests Ordered: Current medicines are reviewed at length with the patient today.  Concerns regarding medicines are outlined above.  Tests Ordered: Orders Placed This Encounter  Procedures  . Basic Metabolic Panel (BMET)   Medication Changes: Meds ordered this encounter  Medications  . amLODipine (NORVASC) 5 MG tablet    Sig: Take 1 tablet (5 mg total) by mouth daily.    Dispense:  30 tablet    Refill:  11    Order Specific Question:   Supervising Provider    Answer:   Sueanne Margarita [id number]  . carvedilol (COREG) 3.125 MG tablet    Sig: Take 1 tablet (3.125 mg total) by mouth 2 (two) times daily with a meal.    Dispense:  60 tablet    Refill:  11    Order Specific Question:   Supervising Provider    Answer:   Sueanne Margarita [[id number]]    Signed, Richardson Dopp, PA-C  11/14/2016 12:48 PM    [facility] [address], [place], [place]  [address] Phone: [phone]; Fax: [phone]

## 2016-11-14 NOTE — Patient Instructions (Addendum)
Medication Instructions:  Decrease Amlodipine to 5 mg Once daily   Start Coreg 3.125 mg Twice daily   Labwork: Today - BMET   Testing/Procedures: None   Follow-Up: Dr. Cristopher Peru in 6 weeks  Any Other Special Instructions Will Be Listed Below (If Applicable). Notify our office if the blood pressure is consistently above 140/90  If you need a refill on your cardiac medications before your next appointment, please call your pharmacy.

## 2016-11-15 LAB — BASIC METABOLIC PANEL
BUN/Creatinine Ratio: 22 (ref 12–28)
BUN: 25 mg/dL (ref 8–27)
CALCIUM: 9.3 mg/dL (ref 8.7–10.3)
CO2: 24 mmol/L (ref 20–29)
Chloride: 98 mmol/L (ref 96–106)
Creatinine, Ser: 1.16 mg/dL — ABNORMAL HIGH (ref 0.57–1.00)
GFR, EST AFRICAN AMERICAN: 50 mL/min/{1.73_m2} — AB (ref >59)
GFR, EST NON AFRICAN AMERICAN: 44 mL/min/{1.73_m2} — AB (ref >59)
Glucose: 199 mg/dL — ABNORMAL HIGH (ref 65–99)
POTASSIUM: 3.9 mmol/L (ref 3.5–5.2)
Sodium: 142 mmol/L (ref 134–144)

## 2016-11-19 ENCOUNTER — Encounter: Payer: Self-pay | Admitting: Internal Medicine

## 2016-11-19 NOTE — Assessment & Plan Note (Signed)
Well controlled; continue Norvasc 5 mg by mouth daily Lotensin 20 mg by mouth daily, Lasix 60 mg daily, and Coreg 3.125 mg by mouth twice a day

## 2016-11-19 NOTE — Assessment & Plan Note (Signed)
Stable and chronic; plan to continue Eliquis 2.5 mg by mouth twice a day

## 2016-11-19 NOTE — Assessment & Plan Note (Signed)
Most recent A1c is 8.6 which is stable from prior, not great but not harmful for age; continue Toujeo 40 units daily and NovoLog 5 units with every meal

## 2016-12-01 ENCOUNTER — Encounter: Payer: Self-pay | Admitting: Internal Medicine

## 2016-12-01 ENCOUNTER — Non-Acute Institutional Stay (SKILLED_NURSING_FACILITY): Payer: Medicare Other | Admitting: Internal Medicine

## 2016-12-01 DIAGNOSIS — R918 Other nonspecific abnormal finding of lung field: Secondary | ICD-10-CM

## 2016-12-01 DIAGNOSIS — M109 Gout, unspecified: Secondary | ICD-10-CM

## 2016-12-01 DIAGNOSIS — I5022 Chronic systolic (congestive) heart failure: Secondary | ICD-10-CM

## 2016-12-01 NOTE — Progress Notes (Signed)
Location:  [facility] Room Number: [place] of Service:  SNF (31)  Hennie Duos, MD  Patient Care Team: Hennie Duos, MD as PCP - General (Internal Medicine) Jola Schmidt, MD as Consulting Physician (Ophthalmology) Cameron Sprang, MD as Consulting Physician (Neurology) Evans Lance, MD as Consulting Physician (Cardiology) Juanito Doom, MD as Consulting Physician (Pulmonary Disease)  Extended Emergency Contact Information Primary Emergency Contact: [name],[name] Address: [address]          [place], [place] [address] [name] of [place] Phone: [phone] Mobile Phone: [phone] Relation: Sister    Allergies: Patient has no known allergies.  Chief Complaint  Patient presents with  . Medical Management of Chronic Issues    routine visit    HPI: Patient is 83 y.o. female who Is being seen for routine issues of congestive heart failure, solitary lung mass, and gout.  Past Medical History:  Diagnosis Date  . Arthritis   . Cancer [facility])    breast, left; s/p lumpectomy/chemo/radiation  . CAP (community acquired pneumonia) 05/02/2015  . Cardiac pacemaker in situ 02/19/2015  . Cerebrovascular accident (CVA) due to thrombosis of cerebral artery ([place]) 03/23/2015  . CKD (chronic kidney disease) 02/19/2015  . Degenerative cervical spinal stenosis 03/01/2016  . Dementia without behavioral disturbance 08/14/2015  . Depression   . Diabetes mellitus without complication ([place])   . Gout 02/19/2015  . H/O colon cancer, stage I 07/03/2016   s/p resection 2013 - colonic mucosa with high-grade dysplasia/adenocarcinoma in situ  . Hyperlipidemia   . Hypertension   . Hypertensive heart disease with CHF (congestive heart failure) ([place]) 02/24/2015  . Hypokalemia 03/01/2016  . Lung mass 04/21/2016  . Mild cognitive impairment 02/24/2015  . Mild mitral regurgitation 03/11/2015  . Moderate tricuspid regurgitation 03/11/2015   02/2015    . Osteoarthritis 03/27/2015  . PAD (peripheral artery disease) ([place]) 02/19/2015   S/p stent in left leg   . Paroxysmal atrial fibrillation (HCC)   . Presence of permanent cardiac pacemaker   . Pulmonary arterial hypertension ([place]), mild 03/11/2015  . Renal disorder   . Spinal cord compression due to degenerative disorder of spinal column ([place]) 03/01/2016  . Transaminasemia   . Type 2 diabetes mellitus without complication, with long-term current use of insulin ([place]) 02/24/2015  . Vitamin B12 deficiency 07/30/2016  . 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 BREAST LUMPECTOMY Left   . CARDIAC PACEMAKER PLACEMENT    . CATARACT EXTRACTION    . EYE SURGERY    . kidney stone removal     x3  . LAPAROSCOPIC NEPHRECTOMY Left 07/29/2015   Procedure: LEFT LAPAROSCOPIC SIMPLE NEPHRECTOMY;  Surgeon: Ardis Hughs, MD;  Location[facility];  Service: Urology;  Laterality: Left;  . LUMBAR SPINE SURGERY     laminectomy and rod    Allergies as of 12/01/2016   No Known Allergies     Medication List       Accurate as of 12/01/16 11:59 PM. Always use your most recent med list.          acetaminophen 325 MG tablet Commonly known as:  TYLENOL Take 650 mg by mouth every 6 (six) hours as needed.   allopurinol 300 MG tablet Commonly known as:  ZYLOPRIM TAKE ONE TABLET BY MOUTH ONCE DAILY   amiodarone 200 MG tablet Commonly known as:  PACERONE Take 1 tablet (200 mg total) by mouth  daily.   amLODipine 5 MG tablet Commonly known as:  NORVASC Take 1 tablet (5 mg total) by mouth daily.   apixaban 2.5 MG Tabs tablet Commonly known as:  ELIQUIS Take 2.5 mg by mouth 2 (two) times daily.   atorvastatin 80 MG tablet Commonly known as:  LIPITOR Take 1 tablet (80 mg total) by mouth daily with supper.   benazepril 20 MG tablet Commonly known as:  LOTENSIN Take 20 mg by mouth daily.   carvedilol 3.125 MG tablet Commonly known as:   COREG Take 1 tablet (3.125 mg total) by mouth 2 (two) times daily with a meal.   donepezil 10 MG tablet Commonly known as:  ARICEPT Take 1 tablet (10 mg total) by mouth at bedtime.   furosemide 40 MG tablet Commonly known as:  LASIX Take 40 mg by mouth. Take one tablet daily   HUMALOG KWIKPEN 100 UNIT/ML KiwkPen Generic drug:  insulin lispro Inject 5 Units into the skin 3 (three) times daily before meals. When CBG is greater than 200; inject 8 units with a CBG greater than 300   meclizine 12.5 MG tablet Commonly known as:  ANTIVERT Take 12.5 mg by mouth 3 (three) times daily as needed for dizziness or nausea.   PROSTATE PO Take by mouth. Take 30 ml daily to promote wound healing   TOUJEO SOLOSTAR 300 UNIT/ML Sopn Generic drug:  Insulin Glargine Inject 4 Units into the skin at bedtime.   WOMENS MULTI VITAMIN & MINERAL PO Take 1 tablet by mouth daily with lunch.       Meds ordered this encounter  Medications  . furosemide (LASIX) 40 MG tablet    Sig: Take 40 mg by mouth. Take one tablet daily  . Specialty Vitamins Products (PROSTATE PO)    Sig: Take by mouth. Take 30 ml daily to promote wound healing    Immunization History  Administered Date(s) Administered  . Influenza, High Dose Seasonal PF 12/30/2015  . PPD Test 04/26/2015, 08/02/2015, 02/23/2016    Social History  Substance Use Topics  . Smoking status: Passive Smoke Exposure - Never Smoker  . Smokeless tobacco: Never Used     Comment: pt's spouse was heavy smoker, smoked in home.   . Alcohol use No    Review of Systems  DATA OBTAINED: from patient, nurse GENERAL:  no fevers, fatigue, appetite changes SKIN: No itching, rash HEENT: No complaint RESPIRATORY: No cough, wheezing, SOB CARDIAC: No chest pain, palpitations, lower extremity edema  GI: No abdominal pain, No N/V/D or constipation, No heartburn or reflux  GU: No dysuria, frequency or urgency, or incontinence  MUSCULOSKELETAL: No unrelieved  bone/joint pain NEUROLOGIC: No headache, dizziness  PSYCHIATRIC: No overt anxiety or sadness  Vitals:   12/01/16 1354  BP: (!) 144/75  Pulse: 78  Resp: 17  Temp: 98.1 F (36.7 C)  SpO2: 96%   Body mass index is 26.56 kg/m. Physical Exam  GENERAL APPEARANCE: Alert, conversant, No acute distress  SKIN: No diaphoresis rash HEENT: Unremarkable RESPIRATORY: Breathing is even, unlabored. Lung sounds are clear   CARDIOVASCULAR: Heart RRR no murmurs, rubs or gallops. 3/4+ peripheral edema ; mild nonpitting edema to bilateral hands GASTROINTESTINAL: Abdomen is soft, non-tender, not distended w/ normal bowel sounds.  GENITOURINARY: Bladder non tender, not distended  MUSCULOSKELETAL: No abnormal joints or musculature NEUROLOGIC: Cranial nerves 2-12 grossly intact. Moves all extremities PSYCHIATRIC: Mood and affect with dementia, no behavioral issues  Patient Active Problem List   Diagnosis Date Noted  .  Chronic systolic CHF (congestive heart failure) ([place]) 10/31/2016  . CAD (coronary artery disease) 10/31/2016  . Vitamin B12 deficiency 07/30/2016  . H/O colon cancer, stage I 07/03/2016  . Lung mass 04/21/2016  . Degenerative cervical spinal stenosis 03/01/2016  . Spinal cord compression due to degenerative disorder of spinal column ([place]) 03/01/2016  . Hypokalemia 03/01/2016  . Myelopathy ([place])   . Generalized weakness 02/19/2016  . Diaphragmatic injury as surgical complication 17/49/4496  . Postoperative anemia due to acute blood loss 08/14/2015  . Dementia without behavioral disturbance 08/14/2015  . Chronic UTI 07/29/2015  . 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 Klebsiella sepsis ([place]) 05/02/2015  . CAP (community acquired pneumonia) 05/02/2015  . Transaminasemia   . Sepsis ([place]) 04/22/2015  . Paroxysmal atrial fibrillation ([place]) 04/22/2015  . Delirium   . Urinary tract infectious disease   . Osteoarthritis 03/27/2015  . Cerebrovascular accident (CVA) due to  thrombosis of cerebral artery ([place]) 03/23/2015  . Moderate tricuspid regurgitation 03/11/2015  . Dilated cardiomyopathy ([place]) 03/11/2015  . Mitral regurgitation 03/11/2015  . Pulmonary arterial hypertension ([place]), mild 03/11/2015  . Mild cognitive impairment 02/24/2015  . Type 2 diabetes mellitus without complication, with long-term current use of insulin ([place]) 02/24/2015  . Hypertensive heart disease with CHF (congestive heart failure) ([place]) 02/24/2015  . Diabetes ([place]) 02/21/2015  . Essential hypertension, benign 02/21/2015  . Hyperlipidemia 02/19/2015  . Gout 02/19/2015  . PAD (peripheral artery disease) ([place]) 02/19/2015  . Cardiac pacemaker in situ 02/19/2015  . CKD (chronic kidney disease) 02/19/2015    CMP     Component Value Date/Time   NA 142 11/14/2016 1255   K 3.9 11/14/2016 1255   CL 98 11/14/2016 1255   CO2 24 11/14/2016 1255   GLUCOSE 199 (H) 11/14/2016 1255   GLUCOSE 139 (H) 02/23/2016 0605   BUN 25 11/14/2016 1255   CREATININE 1.16 (H) 11/14/2016 1255   CALCIUM 9.3 11/14/2016 1255   PROT 7.4 10/31/2016 1529   ALBUMIN 3.6 10/31/2016 1529   AST 29 10/31/2016 1529   ALT 33 (H) 10/31/2016 1529   ALKPHOS 179 (H) 10/31/2016 1529   BILITOT 0.4 10/31/2016 1529   GFRNONAA 44 (L) 11/14/2016 1255   GFRAA 50 (L) 11/14/2016 1255    Recent Labs  02/19/16 1633  02/23/16 0605  09/11/16 10/31/16 1529 11/14/16 1255  NA  --   < > 138  < > 141 142 142  K  --   < > 4.1  < > 4.3 3.7 3.9  CL  --   < > 101  --   --  96 98  CO2  --   < > 33*  --   --  27 24  GLUCOSE  --   < > 139*  --   --  324* 199*  BUN  --   < > 19  < > 25* 21 25  CREATININE  --   < > 1.12*  < > 1.2* 1.49* 1.16*  CALCIUM  --   < > 8.8*  --   --  9.1 9.3  MG 1.8  --   --   --   --   --   --   < > = values in this interval not displayed.  Recent Labs  09/11/16 10/31/16 1529  AST 20 29  ALT 17 33*  ALKPHOS 136* 179*  BILITOT  --  0.4  PROT  --  7.4  ALBUMIN  --  3.6    Recent Labs  02/19/16 1318  02/23/16 0605  04/03/16 09/11/16 10/31/16 1529  WBC 8.6  < > 7.4  < > 8.8 8.7 8.6  HGB 14.2  < > 12.3  < > 12.8 11.9* 12.4  HCT 41.7  < > 36.7  < > 41 37 39.3  MCV 90.7  --  92.0  --   --   --  92  PLT 218  < > 215  < > 228 193 282  < > = values in this interval not displayed.  Recent Labs  02/20/16 0440 04/03/16 09/11/16  CHOL 222* 234* 203*  LDLCALC 133* 135 121  TRIG 74 96 59   No results found for: [facility] Lab Results  Component Value Date   TSH 1.540 10/31/2016   Lab Results  Component Value Date   HGBA1C 8.6 09/11/2016   Lab Results  Component Value Date   CHOL 203 (A) 09/11/2016   HDL 70 09/11/2016   LDLCALC 121 09/11/2016   TRIG 59 09/11/2016   CHOLHDL 3.0 02/20/2016    Significant Diagnostic Results in last 30 days:  No results found.  Assessment and Plan  Chronic systolic CHF (congestive heart failure) (HCC) Patient's edema has improved both upper extremity edema and lower extremity edema; peripheral extremity edema is like 3/4+ with as scheduled with the cardiologist's office and was set 2+; plan to continue current Lasix at 40 mg daily, patient seems to improved with medication changes made at the cardiology office-plan to continue Coreg 3.125 mg by mouth twice a day and Norvasc 5 mg by mouth daily  Lung mass Lung mass reported to be larger; it does not appear to be affecting patient's oxygenation or activity; family has opted to not pursue; we will monitor  Gout No reported flares; continue allopurinol 300 mg by mouth daily     Anne D. Sheppard Coil, MD

## 2016-12-02 ENCOUNTER — Encounter: Payer: Self-pay | Admitting: Internal Medicine

## 2016-12-02 NOTE — Assessment & Plan Note (Signed)
Lung mass reported to be larger; it does not appear to be affecting patient's oxygenation or activity; family has opted to not pursue; we will monitor

## 2016-12-02 NOTE — Assessment & Plan Note (Signed)
Patient's edema has improved both upper extremity edema and lower extremity edema; peripheral extremity edema is like 3/4+ with as scheduled with the cardiologist's office and was set 2+; plan to continue current Lasix at 40 mg daily, patient seems to improved with medication changes made at the cardiology office-plan to continue Coreg 3.125 mg by mouth twice a day and Norvasc 5 mg by mouth daily

## 2016-12-02 NOTE — Assessment & Plan Note (Signed)
No reported flares; continue allopurinol 300 mg by mouth daily

## 2017-01-01 DIAGNOSIS — L89621 Pressure ulcer of left heel, stage 1: Secondary | ICD-10-CM | POA: Diagnosis not present

## 2017-01-02 ENCOUNTER — Encounter: Payer: Self-pay | Admitting: Internal Medicine

## 2017-01-02 ENCOUNTER — Ambulatory Visit: Payer: Medicare Other | Admitting: Internal Medicine

## 2017-01-02 VITALS — BP 132/68 | HR 57 | Ht 60.0 in | Wt 137.0 lb

## 2017-01-02 DIAGNOSIS — I480 Paroxysmal atrial fibrillation: Secondary | ICD-10-CM

## 2017-01-02 DIAGNOSIS — I495 Sick sinus syndrome: Secondary | ICD-10-CM

## 2017-01-02 DIAGNOSIS — Z950 Presence of cardiac pacemaker: Secondary | ICD-10-CM | POA: Diagnosis not present

## 2017-01-02 LAB — CUP PACEART INCLINIC DEVICE CHECK
Brady Statistic AP VP Percent: 0.47 %
Brady Statistic AP VS Percent: 98.63 %
Brady Statistic AS VP Percent: 0 %
Brady Statistic AS VS Percent: 0.9 %
Brady Statistic RV Percent Paced: 0.49 %
Date Time Interrogation Session: 20181113132135
Implantable Lead Implant Date: 20160405
Implantable Lead Location: 753860
Implantable Lead Model: 5076
Lead Channel Impedance Value: 342 Ohm
Lead Channel Impedance Value: 456 Ohm
Lead Channel Pacing Threshold Amplitude: 1.25 V
Lead Channel Sensing Intrinsic Amplitude: 1.25 mV
Lead Channel Sensing Intrinsic Amplitude: 5.25 mV
Lead Channel Sensing Intrinsic Amplitude: 5.375 mV
MDC IDC LEAD IMPLANT DT: 20160405
MDC IDC LEAD LOCATION: 753859
MDC IDC MSMT BATTERY REMAINING LONGEVITY: 83 mo
MDC IDC MSMT BATTERY VOLTAGE: 3.01 V
MDC IDC MSMT LEADCHNL RA IMPEDANCE VALUE: 285 Ohm
MDC IDC MSMT LEADCHNL RA IMPEDANCE VALUE: 399 Ohm
MDC IDC MSMT LEADCHNL RA PACING THRESHOLD AMPLITUDE: 0.75 V
MDC IDC MSMT LEADCHNL RA PACING THRESHOLD PULSEWIDTH: 0.4 ms
MDC IDC MSMT LEADCHNL RA SENSING INTR AMPL: 0.875 mV
MDC IDC MSMT LEADCHNL RV PACING THRESHOLD PULSEWIDTH: 0.4 ms
MDC IDC PG IMPLANT DT: 20160405
MDC IDC SET LEADCHNL RA PACING AMPLITUDE: 2 V
MDC IDC SET LEADCHNL RV PACING AMPLITUDE: 2.25 V
MDC IDC SET LEADCHNL RV PACING PULSEWIDTH: 0.4 ms
MDC IDC SET LEADCHNL RV SENSING SENSITIVITY: 2.8 mV
MDC IDC STAT BRADY RA PERCENT PACED: 99.08 %

## 2017-01-02 NOTE — Patient Instructions (Signed)
Medication Instructions:  Your physician recommends that you continue on your current medications as directed. Please refer to the Current Medication list given to you today.  Labwork: None ordered.  Testing/Procedures: None ordered.  Follow-Up: Your physician wants you to follow-up in: one year with Dr. Lovena Le.   You will receive a reminder letter in the mail two months in advance. If you don't receive a letter, please call our office to schedule the follow-up appointment.  Remote monitoring is used to monitor your Pacemaker from home. This monitoring reduces the number of office visits required to check your device to one time per year. It allows [place] to keep an eye on the functioning of your device to ensure it is working properly. You are scheduled for a device check from home on 04/03/2016. You may send your transmission at any time that day. If you have a wireless device, the transmission will be sent automatically. After your physician reviews your transmission, you will receive a postcard with your next transmission date.    Any Other Special Instructions Will Be Listed Below (If Applicable).     If you need a refill on your cardiac medications before your next appointment, please call your pharmacy.

## 2017-01-02 NOTE — Progress Notes (Signed)
HPI Ms. Norkus returns today for ongoing evaluation and management of her symptomatic bradycardia, s/p PPM insertion. The patient has a  Lung mass which is thought to be due to CA. She was deemed not to be a candidate for aggressive therapies. She has been stable but admits to being a little bit weaker than when I saw her a year ago. She denies palpitations or chest pain or sob.   No Known Allergies   Current Outpatient Medications  Medication Sig Dispense Refill  . acetaminophen (TYLENOL) 325 MG tablet Take 650 mg by mouth every 6 (six) hours as needed.    Marland Kitchen allopurinol (ZYLOPRIM) 300 MG tablet TAKE ONE TABLET BY MOUTH ONCE DAILY 90 tablet 0  . amiodarone (PACERONE) 200 MG tablet Take 1 tablet (200 mg total) by mouth daily. 90 tablet 1  . amLODipine (NORVASC) 5 MG tablet Take 1 tablet (5 mg total) by mouth daily. 30 tablet 11  . apixaban (ELIQUIS) 2.5 MG TABS tablet Take 2.5 mg by mouth 2 (two) times daily.    Marland Kitchen atorvastatin (LIPITOR) 80 MG tablet Take 1 tablet (80 mg total) by mouth daily with supper. 90 tablet 1  . benazepril (LOTENSIN) 20 MG tablet Take 20 mg by mouth daily.     . carvedilol (COREG) 3.125 MG tablet Take 1 tablet (3.125 mg total) by mouth 2 (two) times daily with a meal. 60 tablet 11  . donepezil (ARICEPT) 10 MG tablet Take 1 tablet (10 mg total) by mouth at bedtime. 90 tablet 3  . furosemide (LASIX) 40 MG tablet Take 40 mg by mouth. Take one tablet daily    . HUMALOG KWIKPEN 100 UNIT/ML KiwkPen Inject 5 Units into the skin 3 (three) times daily before meals. When CBG is greater than 200; inject 8 units with a CBG greater than 300    . Insulin Glargine (TOUJEO SOLOSTAR) 300 UNIT/ML SOPN Inject 4 Units into the skin at bedtime.    . meclizine (ANTIVERT) 12.5 MG tablet Take 12.5 mg by mouth 3 (three) times daily as needed for dizziness or nausea.     . Multiple Vitamins-Minerals (WOMENS MULTI VITAMIN & MINERAL PO) Take 1 tablet by mouth daily with lunch.     Marland Kitchen  Specialty Vitamins Products (PROSTATE PO) Take by mouth. Take 30 ml daily to promote wound healing     No current facility-administered medications for this visit.      Past Medical History:  Diagnosis Date  . Arthritis   . Cancer [facility])    breast, left; s/p lumpectomy/chemo/radiation  . CAP (community acquired pneumonia) 05/02/2015  . Cardiac pacemaker in situ 02/19/2015  . Cerebrovascular accident (CVA) due to thrombosis of cerebral artery ([place]) 03/23/2015  . CKD (chronic kidney disease) 02/19/2015  . Degenerative cervical spinal stenosis 03/01/2016  . Dementia without behavioral disturbance 08/14/2015  . Depression   . Diabetes mellitus without complication ([place])   . Gout 02/19/2015  . H/O colon cancer, stage I 07/03/2016   s/p resection 2013 - colonic mucosa with high-grade dysplasia/adenocarcinoma in situ  . Hyperlipidemia   . Hypertension   . Hypertensive heart disease with CHF (congestive heart failure) ([place]) 02/24/2015  . Hypokalemia 03/01/2016  . Lung mass 04/21/2016  . Mild cognitive impairment 02/24/2015  . Mild mitral regurgitation 03/11/2015  . Moderate tricuspid regurgitation 03/11/2015   02/2015   . Osteoarthritis 03/27/2015  . PAD (peripheral artery disease) ([place]) 02/19/2015   S/p stent in left leg   . Paroxysmal  atrial fibrillation ([place])   . Presence of permanent cardiac pacemaker   . Pulmonary arterial hypertension ([place]), mild 03/11/2015  . Renal disorder   . Spinal cord compression due to degenerative disorder of spinal column ([place]) 03/01/2016  . Transaminasemia   . Type 2 diabetes mellitus without complication, with long-term current use of insulin ([place]) 02/24/2015  . Vitamin B12 deficiency 07/30/2016  . 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   Past Surgical History:  Procedure Laterality Date  . BREAST LUMPECTOMY Left   . CARDIAC PACEMAKER PLACEMENT    . CATARACT EXTRACTION    . EYE  SURGERY    . kidney stone removal     x3  . LUMBAR SPINE SURGERY     laminectomy and rod     Family History  Problem Relation Age of Onset  . Cancer Mother        pancreatic  . Diabetes Sister   . Hypertension Sister   . Asthma Sister   . Hypertension Brother      Social History   Socioeconomic History  . Marital status: Widowed    Spouse name: Not on file  . Number of children: 0  . Years of education: Not on file  . Highest education level: Not on file  Social Needs  . Financial resource strain: Not on file  . Food insecurity - worry: Not on file  . Food insecurity - inability: Not on file  . Transportation needs - medical: Not on file  . Transportation needs - non-medical: Not on file  Occupational History  . Occupation: retired [employer]  Tobacco Use  . Smoking status: Passive Smoke Exposure - Never Smoker  . Smokeless tobacco: Never Used  . Tobacco comment: pt's spouse was heavy smoker, smoked in home.   Substance and Sexual Activity  . Alcohol use: No    Alcohol/week: 0.0 oz  . Drug use: No  . Sexual activity: No  Other Topics Concern  . Not on file  Social History Narrative   Admitted to [facility] 02/23/16   Widowed   Never smoked   Alcohol none   DNR     BP 132/68   Pulse (!) 57   Ht 5' (1.524 m)   Wt 137 lb (62.1 kg)   LMP  (LMP Unknown)   SpO2 94%   BMI 26.76 kg/m   Physical Exam:  Well appearing NAD HEENT: Unremarkable Neck:  No JVD, no thyromegally Lymphatics:  No adenopathy Back:  No CVA tenderness Lungs:  Clear with scattered wheezes, right over left HEART:  Regular rate rhythm, no murmurs, no rubs, no clicks Abd:  soft, positive bowel sounds, no organomegally, no rebound, no guarding Ext:  2 plus pulses, no edema, no cyanosis, no clubbing Skin:  No rashes no nodules Neuro:  CN II through XII intact, motor grossly intact   DEVICE  Normal device function.  See PaceArt for details.   Assess/Plan: 1. Sinus node  dysfunction - she is asymptomatic, s/p PPM insertion.  2. HTN - her blood pressure is well controlled today. No change in her meds. 3. PPM -her Medtronic DDD PM has about 7 years of longevity. Will recheck in several months.  Mikle Bosworth.D.

## 2017-01-04 ENCOUNTER — Encounter: Payer: Self-pay | Admitting: Internal Medicine

## 2017-01-04 ENCOUNTER — Non-Acute Institutional Stay (SKILLED_NURSING_FACILITY): Payer: Medicare Other | Admitting: Internal Medicine

## 2017-01-04 DIAGNOSIS — F0280 Dementia in other diseases classified elsewhere without behavioral disturbance: Secondary | ICD-10-CM | POA: Diagnosis not present

## 2017-01-04 DIAGNOSIS — I739 Peripheral vascular disease, unspecified: Secondary | ICD-10-CM

## 2017-01-04 DIAGNOSIS — I480 Paroxysmal atrial fibrillation: Secondary | ICD-10-CM

## 2017-01-04 DIAGNOSIS — G301 Alzheimer's disease with late onset: Secondary | ICD-10-CM

## 2017-01-04 NOTE — Progress Notes (Signed)
Location:  [place] Room Number: [place] of Service:  SNF [phone])  Hennie Duos, MD  Patient Care Team: Hennie Duos, MD as PCP - General (Internal Medicine) Jola Schmidt, MD as Consulting Physician (Ophthalmology) Cameron Sprang, MD as Consulting Physician (Neurology) Evans Lance, MD as Consulting Physician (Cardiology) Juanito Doom, MD as Consulting Physician (Pulmonary Disease)  Extended Emergency Contact Information Primary Emergency Contact: [name],[name] Address: [address]          [place], [place] [address] [name] of [place] Phone: [phone] Mobile Phone: [phone] Relation: Sister    Allergies: Patient has no known allergies.  Chief Complaint  Patient presents with  . Medical Management of Chronic Issues    routine visit    HPI: Patient is 83 y.o. female who is being seen for routine issues have peripheral vascular disease, paroxysmal atrial fibrillation and dementia.  Past Medical History:  Diagnosis Date  . Arthritis   . Cancer [facility])    breast, left; s/p lumpectomy/chemo/radiation  . CAP (community acquired pneumonia) 05/02/2015  . Cardiac pacemaker in situ 02/19/2015  . Cerebrovascular accident (CVA) due to thrombosis of cerebral artery ([place]) 03/23/2015  . CKD (chronic kidney disease) 02/19/2015  . Degenerative cervical spinal stenosis 03/01/2016  . Dementia without behavioral disturbance 08/14/2015  . Depression   . Diabetes mellitus without complication ([place])   . Gout 02/19/2015  . H/O colon cancer, stage I 07/03/2016   s/p resection 2013 - colonic mucosa with high-grade dysplasia/adenocarcinoma in situ  . Hyperlipidemia   . Hypertension   . Hypertensive heart disease with CHF (congestive heart failure) ([place]) 02/24/2015  . Hypokalemia 03/01/2016  . Lung mass 04/21/2016  . Mild cognitive impairment 02/24/2015  . Mild mitral regurgitation 03/11/2015  . Moderate tricuspid regurgitation  03/11/2015   02/2015   . Osteoarthritis 03/27/2015  . PAD (peripheral artery disease) ([place]) 02/19/2015   S/p stent in left leg   . Paroxysmal atrial fibrillation (HCC)   . Presence of permanent cardiac pacemaker   . Pulmonary arterial hypertension ([place]), mild 03/11/2015  . Renal disorder   . Spinal cord compression due to degenerative disorder of spinal column ([place]) 03/01/2016  . Transaminasemia   . Type 2 diabetes mellitus without complication, with long-term current use of insulin ([place]) 02/24/2015  . Vitamin B12 deficiency 07/30/2016  . 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 BREAST LUMPECTOMY Left   . CARDIAC PACEMAKER PLACEMENT    . CATARACT EXTRACTION    . EYE SURGERY    . kidney stone removal     x3  . LAPAROSCOPIC NEPHRECTOMY Left 07/29/2015   Procedure: LEFT LAPAROSCOPIC SIMPLE NEPHRECTOMY;  Surgeon: Ardis Hughs, MD;  Location: [facility] ORS;  Service: Urology;  Laterality: Left;  . LUMBAR SPINE SURGERY     laminectomy and rod    Allergies as of 01/04/2017   No Known Allergies     Medication List        Accurate as of 01/04/17 11:59 PM. Always use your most recent med list.          acetaminophen 325 MG tablet Commonly known as:  TYLENOL Take 650 mg by mouth every 6 (six) hours as needed.   allopurinol 300 MG tablet Commonly known as:  ZYLOPRIM TAKE ONE TABLET BY MOUTH ONCE DAILY   amiodarone 200 MG tablet Commonly known as:  PACERONE Take 1 tablet (200 mg total) by  mouth daily.   amLODipine 5 MG tablet Commonly known as:  NORVASC Take 1 tablet (5 mg total) by mouth daily.   apixaban 2.5 MG Tabs tablet Commonly known as:  ELIQUIS Take 2.5 mg by mouth 2 (two) times daily.   atorvastatin 80 MG tablet Commonly known as:  LIPITOR Take 1 tablet (80 mg total) by mouth daily with supper.   benazepril 20 MG tablet Commonly known as:  LOTENSIN Take 20 mg by mouth daily.   carvedilol 3.125 MG  tablet Commonly known as:  COREG Take 1 tablet (3.125 mg total) by mouth 2 (two) times daily with a meal.   donepezil 10 MG tablet Commonly known as:  ARICEPT Take 1 tablet (10 mg total) by mouth at bedtime.   furosemide 40 MG tablet Commonly known as:  LASIX Take 40 mg by mouth. Take one tablet daily   HUMALOG KWIKPEN 100 UNIT/ML KiwkPen Generic drug:  insulin lispro Inject 5 Units into the skin 3 (three) times daily before meals. When CBG is greater than 200; inject 8 units with a CBG greater than 300   meclizine 12.5 MG tablet Commonly known as:  ANTIVERT Take 12.5 mg by mouth 3 (three) times daily as needed for dizziness or nausea.   PROSTATE PO Take by mouth. Take 30 ml daily to promote wound healing   TOUJEO SOLOSTAR 300 UNIT/ML Sopn Generic drug:  Insulin Glargine Inject 4 Units into the skin at bedtime.   WOMENS MULTI VITAMIN & MINERAL PO Take 1 tablet by mouth daily with lunch.       No orders of the defined types were placed in this encounter.   Immunization History  Administered Date(s) Administered  . Influenza, High Dose Seasonal PF 12/30/2015  . Influenza-Unspecified 12/01/2016  . PPD Test 04/26/2015, 08/02/2015, 02/23/2016    Social History   Tobacco Use  . Smoking status: Passive Smoke Exposure - Never Smoker  . Smokeless tobacco: Never Used  . Tobacco comment: pt's spouse was heavy smoker, smoked in home.   Substance Use Topics  . Alcohol use: No    Alcohol/week: 0.0 oz    Review of Systems  DATA OBTAINED: from patient, nurse GENERAL:  no fevers, fatigue, appetite changes SKIN: No itching, rash HEENT: No complaint RESPIRATORY: No cough, wheezing, SOB CARDIAC: No chest pain, palpitations, lower extremity edema  GI: No abdominal pain, No N/V/D or constipation, No heartburn or reflux  GU: No dysuria, frequency or urgency, or incontinence  MUSCULOSKELETAL: No unrelieved bone/joint pain NEUROLOGIC: No headache, dizziness  PSYCHIATRIC: No  overt anxiety or sadness  Vitals:   01/04/17 1642  BP: (!) 147/75  Pulse: (!) 59  Resp: 18  Temp: 97.6 F (36.4 C)   Body mass index is 27.34 kg/m. Physical Exam  GENERAL APPEARANCE: Alert, conversant, No acute distress  SKIN: No diaphoresis rash HEENT: Unremarkable RESPIRATORY: Breathing is even, unlabored. Lung sounds are clear   CARDIOVASCULAR: Heart RRR no murmurs, rubs or gallops. No peripheral edema  GASTROINTESTINAL: Abdomen is soft, non-tender, not distended w/ normal bowel sounds.  GENITOURINARY: Bladder non tender, not distended  MUSCULOSKELETAL: No abnormal joints or musculature NEUROLOGIC: Cranial nerves 2-12 grossly intact. Moves all extremities PSYCHIATRIC: Mood and affect with dementia, no behavioral issues  Patient Active Problem List   Diagnosis Date Noted  . Chronic systolic CHF (congestive heart failure) ([place]) 10/31/2016  . CAD (coronary artery disease) 10/31/2016  . Vitamin B12 deficiency 07/30/2016  . H/O colon cancer, stage I 07/03/2016  . Lung  mass 04/21/2016  . Degenerative cervical spinal stenosis 03/01/2016  . Spinal cord compression due to degenerative disorder of spinal column ([place]) 03/01/2016  . Hypokalemia 03/01/2016  . Myelopathy ([place])   . Generalized weakness 02/19/2016  . Diaphragmatic injury as surgical complication 16/11/9602  . Postoperative anemia due to acute blood loss 08/14/2015  . Dementia without behavioral disturbance 08/14/2015  . Chronic UTI 07/29/2015  . 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 Klebsiella sepsis ([place]) 05/02/2015  . CAP (community acquired pneumonia) 05/02/2015  . Transaminasemia   . Sepsis ([place]) 04/22/2015  . Paroxysmal atrial fibrillation ([place]) 04/22/2015  . Delirium   . Urinary tract infectious disease   . Osteoarthritis 03/27/2015  . Cerebrovascular accident (CVA) due to thrombosis of cerebral artery ([place]) 03/23/2015  . Moderate tricuspid regurgitation 03/11/2015  . Dilated cardiomyopathy  ([place]) 03/11/2015  . Mitral regurgitation 03/11/2015  . Pulmonary arterial hypertension ([place]), mild 03/11/2015  . Mild cognitive impairment 02/24/2015  . Type 2 diabetes mellitus without complication, with long-term current use of insulin ([place]) 02/24/2015  . Hypertensive heart disease with CHF (congestive heart failure) ([place]) 02/24/2015  . Diabetes ([place]) 02/21/2015  . Essential hypertension, benign 02/21/2015  . Hyperlipidemia 02/19/2015  . Gout 02/19/2015  . PAD (peripheral artery disease) ([place]) 02/19/2015  . Cardiac pacemaker in situ 02/19/2015  . CKD (chronic kidney disease) 02/19/2015    CMP     Component Value Date/Time   NA 142 11/14/2016 1255   K 3.9 11/14/2016 1255   CL 98 11/14/2016 1255   CO2 24 11/14/2016 1255   GLUCOSE 199 (H) 11/14/2016 1255   GLUCOSE 139 (H) 02/23/2016 0605   BUN 25 11/14/2016 1255   CREATININE 1.16 (H) 11/14/2016 1255   CALCIUM 9.3 11/14/2016 1255   PROT 7.4 10/31/2016 1529   ALBUMIN 3.6 10/31/2016 1529   AST 29 10/31/2016 1529   ALT 33 (H) 10/31/2016 1529   ALKPHOS 179 (H) 10/31/2016 1529   BILITOT 0.4 10/31/2016 1529   GFRNONAA 44 (L) 11/14/2016 1255   GFRAA 50 (L) 11/14/2016 1255   Recent Labs    02/19/16 1633  02/23/16 0605  09/11/16 10/31/16 1529 11/14/16 1255  NA  --    < > 138   < > 141 142 142  K  --    < > 4.1   < > 4.3 3.7 3.9  CL  --    < > 101  --   --  96 98  CO2  --    < > 33*  --   --  27 24  GLUCOSE  --    < > 139*  --   --  324* 199*  BUN  --    < > 19   < > 25* 21 25  CREATININE  --    < > 1.12*   < > 1.2* 1.49* 1.16*  CALCIUM  --    < > 8.8*  --   --  9.1 9.3  MG 1.8  --   --   --   --   --   --    < > = values in this interval not displayed.   Recent Labs    09/11/16 10/31/16 1529  AST 20 29  ALT 17 33*  ALKPHOS 136* 179*  BILITOT  --  0.4  PROT  --  7.4  ALBUMIN  --  3.6   Recent Labs    02/19/16 1318  02/23/16 0605  04/03/16 09/11/16 10/31/16 1529  WBC 8.6   < >  7.4   < > 8.8 8.7 8.6  HGB 14.2    < > 12.3   < > 12.8 11.9* 12.4  HCT 41.7   < > 36.7   < > 41 37 39.3  MCV 90.7  --  92.0  --   --   --  92  PLT 218   < > 215   < > 228 193 282   < > = values in this interval not displayed.   Recent Labs    02/20/16 0440 04/03/16 09/11/16  CHOL 222* 234* 203*  LDLCALC 133* 135 121  TRIG 74 96 59   No results found for: [facility] Lab Results  Component Value Date   TSH 1.540 10/31/2016   Lab Results  Component Value Date   HGBA1C 8.6 09/11/2016   Lab Results  Component Value Date   CHOL 203 (A) 09/11/2016   HDL 70 09/11/2016   LDLCALC 121 09/11/2016   TRIG 59 09/11/2016   CHOLHDL 3.0 02/20/2016    Significant Diagnostic Results in last 30 days:  No results found.  Assessment and Plan  PAD (peripheral artery disease) ([place]) No reported skin breakdown; plan to continue Eliquis 2.5 mg twice a day but is also being used for atrial fib; patient is on statin  Paroxysmal atrial fibrillation (HCC) Stable; continue Eliquis 2.5 mg twice a day as prophylaxis and amiodarone 200 mg by mouth daily for rate control  Dementia without behavioral disturbance Chronic and stable; plan to continue Aricept 10 mg by mouth daily    Emorie Mcfate D. Sheppard Coil, MD

## 2017-01-07 ENCOUNTER — Encounter: Payer: Self-pay | Admitting: Internal Medicine

## 2017-01-07 NOTE — Progress Notes (Signed)
Location:  [employer] and [place] of Service:  SNF (31)skilled nursing facility  Allison Duos, MD  Patient Care Team: Allison Duos, MD as PCP - General (Internal Medicine) Allison Schmidt, MD as Consulting Physician (Ophthalmology) Allison Sprang, MD as Consulting Physician (Neurology) Allison Lance, MD as Consulting Physician (Cardiology) Allison Doom, MD as Consulting Physician (Pulmonary Disease)  Extended Emergency Contact Information Primary Emergency Contact: Dunn,Allison Address: [address]          [place], [place] [address] Johnnette Dunn of [place] Phone: [phone] Mobile Phone: [phone] Relation: Sister    Allergies: Patient has no known allergies.  Chief Complaint  Patient presents with  . Acute Visit    HPI: Patient is 83 y.o. female who is being seen acutely because she is reported to have gained significant weight since admission and has been noted to have lower extremity edema. Patient denies chest pain or shortness of breath. Her O2 saturation is 92-96% on room air and she is noted to have edema.    Past Medical History:  Diagnosis Date  . Arthritis   . Cancer [facility])    breast, left; s/p lumpectomy/chemo/radiation  . CAP (community acquired pneumonia) 05/02/2015  . Cardiac pacemaker in situ 02/19/2015  . Cerebrovascular accident (CVA) due to thrombosis of cerebral artery ([place]) 03/23/2015  . CKD (chronic kidney disease) 02/19/2015  . Degenerative cervical spinal stenosis 03/01/2016  . Dementia without behavioral disturbance 08/14/2015  . Depression   . Diabetes mellitus without complication ([place])   . Gout 02/19/2015  . H/O colon cancer, stage I 07/03/2016   s/p resection 2013 - colonic mucosa with high-grade dysplasia/adenocarcinoma in situ  . Hyperlipidemia   . Hypertension   . Hypertensive heart disease with CHF (congestive heart failure) ([place]) 02/24/2015  . Hypokalemia 03/01/2016  . Lung mass  04/21/2016  . Mild cognitive impairment 02/24/2015  . Mild mitral regurgitation 03/11/2015  . Moderate tricuspid regurgitation 03/11/2015   02/2015   . Osteoarthritis 03/27/2015  . PAD (peripheral artery disease) ([place]) 02/19/2015   S/p stent in left leg   . Paroxysmal atrial fibrillation (HCC)   . Presence of permanent cardiac pacemaker   . Pulmonary arterial hypertension ([place]), mild 03/11/2015  . Renal disorder   . Spinal cord compression due to degenerative disorder of spinal column ([place]) 03/01/2016  . Transaminasemia   . Type 2 diabetes mellitus without complication, with long-term current use of insulin ([place]) 02/24/2015  . Vitamin B12 deficiency 07/30/2016  . 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 BREAST LUMPECTOMY Left   . CARDIAC PACEMAKER PLACEMENT    . CATARACT EXTRACTION    . EYE SURGERY    . kidney stone removal     x3  . LEFT LAPAROSCOPIC SIMPLE NEPHRECTOMY Left 07/29/2015   Performed by Ardis Hughs, MD at [facility] ORS  . LUMBAR SPINE SURGERY     laminectomy and rod    Allergies as of 10/25/2016   No Known Allergies     Medication List        Accurate as of 10/25/16 11:59 PM. Always use your most recent med list.          acetaminophen 325 MG tablet Commonly known as:  TYLENOL Take 650 mg by mouth every 6 (six) hours as needed.   allopurinol 300 MG tablet Commonly known as:  ZYLOPRIM TAKE ONE TABLET BY MOUTH ONCE DAILY  amiodarone 200 MG tablet Commonly known as:  PACERONE Take 1 tablet (200 mg total) by mouth daily.   apixaban 2.5 MG Tabs tablet Commonly known as:  ELIQUIS Take 2.5 mg by mouth 2 (two) times daily.   atorvastatin 80 MG tablet Commonly known as:  LIPITOR Take 1 tablet (80 mg total) by mouth daily with supper.   benazepril 20 MG tablet Commonly known as:  LOTENSIN Take 20 mg by mouth daily.   donepezil 10 MG tablet Commonly known as:  ARICEPT Take 1 tablet (10 mg total) by  mouth at bedtime.   HUMALOG KWIKPEN 100 UNIT/ML KiwkPen Generic drug:  insulin lispro Inject 5 Units into the skin 3 (three) times daily before meals. When CBG is greater than 200; inject 8 units with a CBG greater than 300   TOUJEO SOLOSTAR 300 UNIT/ML Sopn Generic drug:  Insulin Glargine Inject 4 Units into the skin at bedtime.   WOMENS MULTI VITAMIN & MINERAL PO Take 1 tablet by mouth daily with lunch.       No orders of the defined types were placed in this encounter.   Immunization History  Administered Date(s) Administered  . Influenza, High Dose Seasonal PF 12/30/2015  . Influenza-Unspecified 12/01/2016  . PPD Test 04/26/2015, 08/02/2015, 02/23/2016    Social History   Tobacco Use  . Smoking status: Passive Smoke Exposure - Never Smoker  . Smokeless tobacco: Never Used  . Tobacco comment: pt's spouse was heavy smoker, smoked in home.   Substance Use Topics  . Alcohol use: No    Alcohol/week: 0.0 oz    Review of Systems  DATA OBTAINED: from patient, nursing-as per history of present illness GENERAL:  no fevers, fatigue, appetite changes SKIN: No itching, rash HEENT: No complaint RESPIRATORY: No cough, wheezing, SOB CARDIAC: No chest pain, palpitations,+ lower extremity edema  GI: No abdominal pain, No N/V/D or constipation, No heartburn or reflux  GU: No dysuria, frequency or urgency, or incontinence  MUSCULOSKELETAL: No unrelieved bone/joint pain NEUROLOGIC: No headache, dizziness  PSYCHIATRIC: No overt anxiety or sadness  Vitals:   01/07/17 1044  BP: (!) 136/54  Pulse: (!) 56  Resp: 20  Temp: 97.6 F (36.4 C)   Body mass index is 29.1 kg/m. Physical Exam  GENERAL APPEARANCE: Alert, conversant, No acute distress  SKIN: No diaphoresis rash HEENT: Unremarkable RESPIRATORY: Breathing is even, unlabored. Lung sounds are diffusely decreased ; O2 sat at bedside on room air 92-96%  CARDIOVASCULAR: Heart RRR no murmurs, rubs or gallops. 2+ bilateral  lower extremity peripheral edema ; 1+ nonpitting edema to hands GASTROINTESTINAL: Abdomen is soft, non-tender, not distended w/ normal bowel sounds.  GENITOURINARY: Bladder non tender, not distended  MUSCULOSKELETAL: No abnormal joints or musculature NEUROLOGIC: Cranial nerves 2-12 grossly intact. Moves all extremities PSYCHIATRIC: Mood and affect appropriate with dementia, no behavioral issues  Patient Active Problem List   Diagnosis Date Noted  . Chronic systolic CHF (congestive heart failure) ([place]) 10/31/2016  . CAD (coronary artery disease) 10/31/2016  . Vitamin B12 deficiency 07/30/2016  . H/O colon cancer, stage I 07/03/2016  . Lung mass 04/21/2016  . Degenerative cervical spinal stenosis 03/01/2016  . Spinal cord compression due to degenerative disorder of spinal column ([place]) 03/01/2016  . Hypokalemia 03/01/2016  . Myelopathy ([place])   . Generalized weakness 02/19/2016  . Diaphragmatic injury as surgical complication 71/07/2692  . Postoperative anemia due to acute blood loss 08/14/2015  . Dementia without behavioral disturbance 08/14/2015  . Chronic UTI 07/29/2015  .  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 Klebsiella sepsis ([place]) 05/02/2015  . CAP (community acquired pneumonia) 05/02/2015  . Transaminasemia   . Sepsis ([place]) 04/22/2015  . Paroxysmal atrial fibrillation ([place]) 04/22/2015  . Delirium   . Urinary tract infectious disease   . Osteoarthritis 03/27/2015  . Cerebrovascular accident (CVA) due to thrombosis of cerebral artery ([place]) 03/23/2015  . Moderate tricuspid regurgitation 03/11/2015  . Dilated cardiomyopathy ([place]) 03/11/2015  . Mitral regurgitation 03/11/2015  . Pulmonary arterial hypertension ([place]), mild 03/11/2015  . Mild cognitive impairment 02/24/2015  . Type 2 diabetes mellitus without complication, with long-term current use of insulin ([place]) 02/24/2015  . Hypertensive heart disease with CHF (congestive heart failure) ([place]) 02/24/2015  . Diabetes  ([place]) 02/21/2015  . Essential hypertension, benign 02/21/2015  . Hyperlipidemia 02/19/2015  . Gout 02/19/2015  . PAD (peripheral artery disease) ([place]) 02/19/2015  . Cardiac pacemaker in situ 02/19/2015  . CKD (chronic kidney disease) 02/19/2015    CMP     Component Value Date/Time   NA 142 11/14/2016 1255   K 3.9 11/14/2016 1255   CL 98 11/14/2016 1255   CO2 24 11/14/2016 1255   GLUCOSE 199 (H) 11/14/2016 1255   GLUCOSE 139 (H) 02/23/2016 0605   BUN 25 11/14/2016 1255   CREATININE 1.16 (H) 11/14/2016 1255   CALCIUM 9.3 11/14/2016 1255   PROT 7.4 10/31/2016 1529   ALBUMIN 3.6 10/31/2016 1529   AST 29 10/31/2016 1529   ALT 33 (H) 10/31/2016 1529   ALKPHOS 179 (H) 10/31/2016 1529   BILITOT 0.4 10/31/2016 1529   GFRNONAA 44 (L) 11/14/2016 1255   GFRAA 50 (L) 11/14/2016 1255   Recent Labs    02/19/16 1633  02/23/16 0605  09/11/16 10/31/16 1529 11/14/16 1255  NA  --    < > 138   < > 141 142 142  K  --    < > 4.1   < > 4.3 3.7 3.9  CL  --    < > 101  --   --  96 98  CO2  --    < > 33*  --   --  27 24  GLUCOSE  --    < > 139*  --   --  324* 199*  BUN  --    < > 19   < > 25* 21 25  CREATININE  --    < > 1.12*   < > 1.2* 1.49* 1.16*  CALCIUM  --    < > 8.8*  --   --  9.1 9.3  MG 1.8  --   --   --   --   --   --    < > = values in this interval not displayed.   Recent Labs    09/11/16 10/31/16 1529  AST 20 29  ALT 17 33*  ALKPHOS 136* 179*  BILITOT  --  0.4  PROT  --  7.4  ALBUMIN  --  3.6   Recent Labs    02/19/16 1318  02/23/16 0605  04/03/16 09/11/16 10/31/16 1529  WBC 8.6   < > 7.4   < > 8.8 8.7 8.6  HGB 14.2   < > 12.3   < > 12.8 11.9* 12.4  HCT 41.7   < > 36.7   < > 41 37 39.3  MCV 90.7  --  92.0  --   --   --  92  PLT 218   < > 215   < >  228 193 282   < > = values in this interval not displayed.   Recent Labs    02/20/16 0440 04/03/16 09/11/16  CHOL 222* 234* 203*  LDLCALC 133* 135 121  TRIG 74 96 59   No results found for: [facility] Lab  Results  Component Value Date   TSH 1.540 10/31/2016   Lab Results  Component Value Date   HGBA1C 8.6 09/11/2016   Lab Results  Component Value Date   CHOL 203 (A) 09/11/2016   HDL 70 09/11/2016   LDLCALC 121 09/11/2016   TRIG 59 09/11/2016   CHOLHDL 3.0 02/20/2016    Significant Diagnostic Results in last 30 days:  No results found.  Assessment and Plan  WEIGHT GAIN WITH EDEMA- patient is not on Lasix; will start Lasix 60 mg a day for 3 days then back down to Lasix 40 mg a day with BMP on 9/10 to follow lites and renal function; have made consult for patient to see cardiology as outpatient    Inocencio Homes, MD

## 2017-01-08 ENCOUNTER — Non-Acute Institutional Stay (SKILLED_NURSING_FACILITY): Payer: Medicare Other | Admitting: Internal Medicine

## 2017-01-08 ENCOUNTER — Encounter: Payer: Self-pay | Admitting: Internal Medicine

## 2017-01-08 DIAGNOSIS — J069 Acute upper respiratory infection, unspecified: Secondary | ICD-10-CM | POA: Diagnosis not present

## 2017-01-08 NOTE — Progress Notes (Signed)
Location:  [facility] Room Number: [place] of Service:  SNF ([phone])  Hennie Duos, MD  Patient Care Team: Hennie Duos, MD as PCP - General (Internal Medicine) Jola Schmidt, MD as Consulting Physician (Ophthalmology) Cameron Sprang, MD as Consulting Physician (Neurology) Evans Lance, MD as Consulting Physician (Cardiology) Juanito Doom, MD as Consulting Physician (Pulmonary Disease)  Extended Emergency Contact Information Primary Emergency Contact: [name],[name] Address: [address]          [place], [place] [address] [name] of [place] Phone: [phone] Mobile Phone: [phone] Relation: Sister    Allergies: Patient has no known allergies.  Chief Complaint  Patient presents with  . Acute Visit    cold with cough    HPI: Patient is 83 y.o. female who nursing asked me to see for a cough and cold. The patient patient has had a cough for about a week but says it is getting better now. Multiple people in the skilled nursing facility of had upper respiratory symptoms. Patient denies chest pain wheezing shortness of breath or fever chills. Patient's O2 saturation on room air is 96%.  Past Medical History:  Diagnosis Date  . Arthritis   . Cancer [facility])    breast, left; s/p lumpectomy/chemo/radiation  . CAP (community acquired pneumonia) 05/02/2015  . Cardiac pacemaker in situ 02/19/2015  . Cerebrovascular accident (CVA) due to thrombosis of cerebral artery ([place]) 03/23/2015  . CKD (chronic kidney disease) 02/19/2015  . Degenerative cervical spinal stenosis 03/01/2016  . Dementia without behavioral disturbance 08/14/2015  . Depression   . Diabetes mellitus without complication ([place])   . Gout 02/19/2015  . H/O colon cancer, stage I 07/03/2016   s/p resection 2013 - colonic mucosa with high-grade dysplasia/adenocarcinoma in situ  . Hyperlipidemia   . Hypertension   . Hypertensive heart disease with CHF  (congestive heart failure) ([place]) 02/24/2015  . Hypokalemia 03/01/2016  . Lung mass 04/21/2016  . Mild cognitive impairment 02/24/2015  . Mild mitral regurgitation 03/11/2015  . Moderate tricuspid regurgitation 03/11/2015   02/2015   . Osteoarthritis 03/27/2015  . PAD (peripheral artery disease) ([place]) 02/19/2015   S/p stent in left leg   . Paroxysmal atrial fibrillation (H[facility])   . Presence of permanent cardiac pacemaker   . Pulmonary arterial hypertension ([place]), mild 03/11/2015  . Renal disorder   . Spinal cord compression due to degenerative disorder of spinal column ([place]) 03/01/2016  . Transaminasemia   . Type 2 diabetes mellitus without complication, with long-term current use of insulin ([place]) 02/24/2015  . Vitamin B12 deficiency 07/30/2016  . 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 BREAST LUMPECTOMY Left   . CARDIAC PACEMAKER PLACEMENT    . CATARACT EXTRACTION    . EYE SURGERY    . kidney stone removal     x3  . LEFT LAPAROSCOPIC SIMPLE NEPHRECTOMY Left 07/29/2015   Performed by Ardis Hughs, MD at [facility] ORS  . LUMBAR SPINE SURGERY     laminectomy and rod    Allergies as of 01/08/2017   No Known Allergies     Medication List        Accurate as of 01/08/17  5:01 PM. Always use your most recent med list.          acetaminophen 325 MG tablet Commonly known as:  TYLENOL Take 650 mg by mouth every 6 (six) hours as needed.   allopurinol  300 MG tablet Commonly known as:  ZYLOPRIM TAKE ONE TABLET BY MOUTH ONCE DAILY   amiodarone 200 MG tablet Commonly known as:  PACERONE Take 1 tablet (200 mg total) by mouth daily.   amLODipine 5 MG tablet Commonly known as:  NORVASC Take 1 tablet (5 mg total) by mouth daily.   apixaban 2.5 MG Tabs tablet Commonly known as:  ELIQUIS Take 2.5 mg by mouth 2 (two) times daily.   atorvastatin 80 MG tablet Commonly known as:  LIPITOR Take 1 tablet (80 mg total) by mouth daily with  supper.   benazepril 20 MG tablet Commonly known as:  LOTENSIN Take 20 mg by mouth daily.   carvedilol 3.125 MG tablet Commonly known as:  COREG Take 1 tablet (3.125 mg total) by mouth 2 (two) times daily with a meal.   donepezil 10 MG tablet Commonly known as:  ARICEPT Take 1 tablet (10 mg total) by mouth at bedtime.   furosemide 40 MG tablet Commonly known as:  LASIX Take 40 mg by mouth. Take one tablet daily   HUMALOG KWIKPEN 100 UNIT/ML KiwkPen Generic drug:  insulin lispro Inject 5 Units into the skin 3 (three) times daily before meals. When CBG is greater than 200; inject 8 units with a CBG greater than 300   meclizine 12.5 MG tablet Commonly known as:  ANTIVERT Take 12.5 mg by mouth 3 (three) times daily as needed for dizziness or nausea.   PROSTATE PO Take by mouth. Take 30 ml daily to promote wound healing   TOUJEO SOLOSTAR 300 UNIT/ML Sopn Generic drug:  Insulin Glargine Inject 4 Units into the skin at bedtime.   WOMENS MULTI VITAMIN & MINERAL PO Take 1 tablet by mouth daily with lunch.       No orders of the defined types were placed in this encounter.   Immunization History  Administered Date(s) Administered  . Influenza, High Dose Seasonal PF 12/30/2015  . Influenza-Unspecified 12/01/2016  . PPD Test 04/26/2015, 08/02/2015, 02/23/2016    Social History   Tobacco Use  . Smoking status: Passive Smoke Exposure - Never Smoker  . Smokeless tobacco: Never Used  . Tobacco comment: pt's spouse was heavy smoker, smoked in home.   Substance Use Topics  . Alcohol use: No    Alcohol/week: 0.0 oz    Review of Systems  DATA OBTAINED: from patient, nurse GENERAL:  no fevers, fatigue, appetite changes SKIN: No itching, rash HEENT: No complaint RESPIRATORY: + cough-improved, no wheezing, no SOB CARDIAC: No chest pain, palpitations, lower extremity edema  GI: No abdominal pain, No N/V/D or constipation, No heartburn or reflux  GU: No dysuria, frequency  or urgency, or incontinence   MUSCULOSKELETAL: No unrelieved bone/joint pain NEUROLOGIC: No headache, dizziness  PSYCHIATRIC: No overt anxiety or sadness  Vitals:   01/08/17 1654  BP: 138/74  Pulse: 85  Resp: 18  Temp: 98.4 F (36.9 C)  SpO2: 98%   Body mass index is 27.34 kg/m. Physical Exam  GENERAL APPEARANCE: Alert, conversant, No acute distress  SKIN: No diaphoresis rash HEENT: Unremarkable RESPIRATORY: Breathing is even, unlabored. Lung sounds are clear ; bedside O2 saturation room air 96%  CARDIOVASCULAR: Heart RRR no murmurs, rubs or gallops. No peripheral edema  GASTROINTESTINAL: Abdomen is soft, non-tender, not distended w/ normal bowel sounds.  GENITOURINARY: Bladder non tender, not distended  MUSCULOSKELETAL: No abnormal joints or musculature NEUROLOGIC: Cranial nerves 2-12 grossly intact. Moves all extremities PSYCHIATRIC: Mood and affect appropriate to situation, no behavioral issues  Patient Active Problem List   Diagnosis Date Noted  . Chronic systolic CHF (congestive heart failure) ([place]) 10/31/2016  . CAD (coronary artery disease) 10/31/2016  . Vitamin B12 deficiency 07/30/2016  . H/O colon cancer, stage I 07/03/2016  . Lung mass 04/21/2016  . Degenerative cervical spinal stenosis 03/01/2016  . Spinal cord compression due to degenerative disorder of spinal column ([place]) 03/01/2016  . Hypokalemia 03/01/2016  . Myelopathy ([place])   . Generalized weakness 02/19/2016  . Diaphragmatic injury as surgical complication 40/34/7425  . Postoperative anemia due to acute blood loss 08/14/2015  . Dementia without behavioral disturbance 08/14/2015  . Chronic UTI 07/29/2015  . 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 Klebsiella sepsis ([place]) 05/02/2015  . CAP (community acquired pneumonia) 05/02/2015  . Transaminasemia   . Sepsis ([place]) 04/22/2015  . Paroxysmal atrial fibrillation ([place]) 04/22/2015  . Delirium   . Urinary tract infectious disease   .  Osteoarthritis 03/27/2015  . Cerebrovascular accident (CVA) due to thrombosis of cerebral artery ([place]) 03/23/2015  . Moderate tricuspid regurgitation 03/11/2015  . Dilated cardiomyopathy ([place]) 03/11/2015  . Mitral regurgitation 03/11/2015  . Pulmonary arterial hypertension ([place]), mild 03/11/2015  . Mild cognitive impairment 02/24/2015  . Type 2 diabetes mellitus without complication, with long-term current use of insulin ([place]) 02/24/2015  . Hypertensive heart disease with CHF (congestive heart failure) ([place]) 02/24/2015  . Diabetes ([place]) 02/21/2015  . Essential hypertension, benign 02/21/2015  . Hyperlipidemia 02/19/2015  . Gout 02/19/2015  . PAD (peripheral artery disease) ([place]) 02/19/2015  . Cardiac pacemaker in situ 02/19/2015  . CKD (chronic kidney disease) 02/19/2015    CMP     Component Value Date/Time   NA 142 11/14/2016 1255   K 3.9 11/14/2016 1255   CL 98 11/14/2016 1255   CO2 24 11/14/2016 1255   GLUCOSE 199 (H) 11/14/2016 1255   GLUCOSE 139 (H) 02/23/2016 0605   BUN 25 11/14/2016 1255   CREATININE 1.16 (H) 11/14/2016 1255   CALCIUM 9.3 11/14/2016 1255   PROT 7.4 10/31/2016 1529   ALBUMIN 3.6 10/31/2016 1529   AST 29 10/31/2016 1529   ALT 33 (H) 10/31/2016 1529   ALKPHOS 179 (H) 10/31/2016 1529   BILITOT 0.4 10/31/2016 1529   GFRNONAA 44 (L) 11/14/2016 1255   GFRAA 50 (L) 11/14/2016 1255   Recent Labs    02/19/16 1633  02/23/16 0605  09/11/16 10/31/16 1529 11/14/16 1255  NA  --    < > 138   < > 141 142 142  K  --    < > 4.1   < > 4.3 3.7 3.9  CL  --    < > 101  --   --  96 98  CO2  --    < > 33*  --   --  27 24  GLUCOSE  --    < > 139*  --   --  324* 199*  BUN  --    < > 19   < > 25* 21 25  CREATININE  --    < > 1.12*   < > 1.2* 1.49* 1.16*  CALCIUM  --    < > 8.8*  --   --  9.1 9.3  MG 1.8  --   --   --   --   --   --    < > = values in this interval not displayed.   Recent Labs    09/11/16 10/31/16 1529  AST 20 29  ALT 17  33*  ALKPHOS 136* 179*    BILITOT  --  0.4  PROT  --  7.4  ALBUMIN  --  3.6   Recent Labs    02/19/16 1318  02/23/16 0605  04/03/16 09/11/16 10/31/16 1529  WBC 8.6   < > 7.4   < > 8.8 8.7 8.6  HGB 14.2   < > 12.3   < > 12.8 11.9* 12.4  HCT 41.7   < > 36.7   < > 41 37 39.3  MCV 90.7  --  92.0  --   --   --  92  PLT 218   < > 215   < > 228 193 282   < > = values in this interval not displayed.   Recent Labs    02/20/16 0440 04/03/16 09/11/16  CHOL 222* 234* 203*  LDLCALC 133* 135 121  TRIG 74 96 59   No results found for: [facility] Lab Results  Component Value Date   TSH 1.540 10/31/2016   Lab Results  Component Value Date   HGBA1C 8.6 09/11/2016   Lab Results  Component Value Date   CHOL 203 (A) 09/11/2016   HDL 70 09/11/2016   LDLCALC 121 09/11/2016   TRIG 59 09/11/2016   CHOLHDL 3.0 02/20/2016    Significant Diagnostic Results in last 30 days:  No results found.  Assessment and Plan  UPPER RESPIRATORY INFECTION- by history MI patient examined and appears the patient has had a cold and cough but it is improving; will continue to monitor to resolution   Webb Silversmith D. Sheppard Coil, MD

## 2017-01-09 DIAGNOSIS — L89621 Pressure ulcer of left heel, stage 1: Secondary | ICD-10-CM | POA: Diagnosis not present

## 2017-01-10 DIAGNOSIS — I739 Peripheral vascular disease, unspecified: Secondary | ICD-10-CM | POA: Diagnosis not present

## 2017-01-12 ENCOUNTER — Encounter: Payer: Self-pay | Admitting: Internal Medicine

## 2017-01-12 NOTE — Assessment & Plan Note (Signed)
No reported skin breakdown; plan to continue Eliquis 2.5 mg twice a day but is also being used for atrial fib; patient is on statin

## 2017-01-12 NOTE — Assessment & Plan Note (Signed)
Stable; continue Eliquis 2.5 mg twice a day as prophylaxis and amiodarone 200 mg by mouth daily for rate control

## 2017-01-12 NOTE — Assessment & Plan Note (Signed)
Chronic and stable; plan to continue Aricept 10 mg by mouth daily

## 2017-01-15 DIAGNOSIS — L89621 Pressure ulcer of left heel, stage 1: Secondary | ICD-10-CM | POA: Diagnosis not present

## 2017-01-15 DIAGNOSIS — I70234 Atherosclerosis of native arteries of right leg with ulceration of heel and midfoot: Secondary | ICD-10-CM | POA: Diagnosis not present

## 2017-01-18 ENCOUNTER — Non-Acute Institutional Stay (SKILLED_NURSING_FACILITY): Payer: Medicare Other | Admitting: Internal Medicine

## 2017-01-18 DIAGNOSIS — R229 Localized swelling, mass and lump, unspecified: Secondary | ICD-10-CM | POA: Diagnosis not present

## 2017-01-19 ENCOUNTER — Encounter: Payer: Self-pay | Admitting: Internal Medicine

## 2017-01-19 NOTE — Progress Notes (Signed)
Location:  [facility] Room Number: [place] of Service:  SNF (31)  Hennie Duos, MD  Patient Care Team: Hennie Duos, MD as PCP - General (Internal Medicine) Jola Schmidt, MD as Consulting Physician (Ophthalmology) Cameron Sprang, MD as Consulting Physician (Neurology) Evans Lance, MD as Consulting Physician (Cardiology) Juanito Doom, MD as Consulting Physician (Pulmonary Disease)  Extended Emergency Contact Information Primary Emergency Contact: [name],[name] Address: [address]          [place], [place] [address] [name] of [place] Phone: [phone] Mobile Phone: [phone] Relation: Sister    Allergies: Patient has no known allergies.  Chief Complaint  Patient presents with  . Acute Visit    knot on left hip    HPI: Patient is 83 y.o. female who are seeing is basically because patient complains of a "knot" on her left hip. Patient denies pain. Patient denies inability to move his usual. Patient not sure how long it's been there.  Past Medical History:  Diagnosis Date  . Arthritis   . Cancer [facility])    breast, left; s/p lumpectomy/chemo/radiation  . CAP (community acquired pneumonia) 05/02/2015  . Cardiac pacemaker in situ 02/19/2015  . Cerebrovascular accident (CVA) due to thrombosis of cerebral artery ([place]) 03/23/2015  . CKD (chronic kidney disease) 02/19/2015  . Degenerative cervical spinal stenosis 03/01/2016  . Dementia without behavioral disturbance 08/14/2015  . Depression   . Diabetes mellitus without complication ([place])   . Gout 02/19/2015  . H/O colon cancer, stage I 07/03/2016   s/p resection 2013 - colonic mucosa with high-grade dysplasia/adenocarcinoma in situ  . Hyperlipidemia   . Hypertension   . Hypertensive heart disease with CHF (congestive heart failure) ([place]) 02/24/2015  . Hypokalemia 03/01/2016  . Lung mass 04/21/2016  . Mild cognitive impairment 02/24/2015  . Mild mitral  regurgitation 03/11/2015  . Moderate tricuspid regurgitation 03/11/2015   02/2015   . Osteoarthritis 03/27/2015  . PAD (peripheral artery disease) ([place]) 02/19/2015   S/p stent in left leg   . Paroxysmal atrial fibrillation (HCC)   . Presence of permanent cardiac pacemaker   . Pulmonary arterial hypertension ([place]), mild 03/11/2015  . Renal disorder   . Spinal cord compression due to degenerative disorder of spinal column ([place]) 03/01/2016  . Transaminasemia   . Type 2 diabetes mellitus without complication, with long-term current use of insulin ([place]) 02/24/2015  . Vitamin B12 deficiency 07/30/2016  . 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 BREAST LUMPECTOMY Left   . CARDIAC PACEMAKER PLACEMENT    . CATARACT EXTRACTION    . EYE SURGERY    . kidney stone removal     x3  . LAPAROSCOPIC NEPHRECTOMY Left 07/29/2015   Procedure: LEFT LAPAROSCOPIC SIMPLE NEPHRECTOMY;  Surgeon: Ardis Hughs, MD;  Location: [facility];  Service: Urology;  Laterality: Left;  . LUMBAR SPINE SURGERY     laminectomy and rod    Allergies as of 01/18/2017   No Known Allergies     Medication List        Accurate as of 01/18/17 11:59 PM. Always use your most recent med list.          acetaminophen 325 MG tablet Commonly known as:  TYLENOL Take 650 mg by mouth every 6 (six) hours as needed.   allopurinol 300 MG tablet Commonly known as:  ZYLOPRIM TAKE ONE TABLET BY MOUTH ONCE DAILY  amiodarone 200 MG tablet Commonly known as:  PACERONE Take 1 tablet (200 mg total) by mouth daily.   amLODipine 5 MG tablet Commonly known as:  NORVASC Take 1 tablet (5 mg total) by mouth daily.   apixaban 2.5 MG Tabs tablet Commonly known as:  ELIQUIS Take 2.5 mg by mouth 2 (two) times daily.   atorvastatin 80 MG tablet Commonly known as:  LIPITOR Take 1 tablet (80 mg total) by mouth daily with supper.   benazepril 20 MG tablet Commonly known as:   LOTENSIN Take 20 mg by mouth daily.   carvedilol 3.125 MG tablet Commonly known as:  COREG Take 1 tablet (3.125 mg total) by mouth 2 (two) times daily with a meal.   donepezil 10 MG tablet Commonly known as:  ARICEPT Take 1 tablet (10 mg total) by mouth at bedtime.   furosemide 40 MG tablet Commonly known as:  LASIX Take 40 mg by mouth. Take one tablet daily   HUMALOG KWIKPEN 100 UNIT/ML KiwkPen Generic drug:  insulin lispro Inject 5 Units into the skin 3 (three) times daily before meals. When CBG is greater than 200; inject 8 units with a CBG greater than 300   meclizine 12.5 MG tablet Commonly known as:  ANTIVERT Take 12.5 mg by mouth 3 (three) times daily as needed for dizziness or nausea.   PROSTATE PO Take by mouth. Take 30 ml daily to promote wound healing   TOUJEO SOLOSTAR 300 UNIT/ML Sopn Generic drug:  Insulin Glargine Inject 4 Units into the skin at bedtime.   WOMENS MULTI VITAMIN & MINERAL PO Take 1 tablet by mouth daily with lunch.       No orders of the defined types were placed in this encounter.   Immunization History  Administered Date(s) Administered  . Influenza, High Dose Seasonal PF 12/30/2015  . Influenza-Unspecified 12/01/2016  . PPD Test 04/26/2015, 08/02/2015, 02/23/2016    Social History   Tobacco Use  . Smoking status: Passive Smoke Exposure - Never Smoker  . Smokeless tobacco: Never Used  . Tobacco comment: pt's spouse was heavy smoker, smoked in home.   Substance Use Topics  . Alcohol use: No    Alcohol/week: 0.0 oz    Review of Systems  DATA OBTAINED: from patient, nurse GENERAL:  no fevers, fatigue, appetite changes SKIN: No itching, rash HEENT: No complaint RESPIRATORY: No cough, wheezing, SOB CARDIAC: No chest pain, palpitations, lower extremity edema  GI: No abdominal pain, No N/V/D or constipation, No heartburn or reflux  GU: No dysuria, frequency or urgency, or incontinence  MUSCULOSKELETAL: Not a left  hip NEUROLOGIC: No headache, dizziness  PSYCHIATRIC: No overt anxiety or sadness  Vitals:   01/19/17 1118  BP: 138/74  Pulse: 85  Resp: 18  Temp: 98.4 F (36.9 C)  SpO2: 98%   Body mass index is 27.34 kg/m. Physical Exam  GENERAL APPEARANCE: Alert, conversant, No acute distress  SKIN: No diaphoresis rash HEENT: Unremarkable RESPIRATORY: Breathing is even, unlabored. Lung sounds are clear   CARDIOVASCULAR: Heart RRR no murmurs, rubs or gallops. No peripheral edema  GASTROINTESTINAL: Abdomen is soft, non-tender, not distended w/ normal bowel sounds.  GENITOURINARY: Bladder non tender, not distended  MUSCULOSKELETAL: palpated entire area of hip and there were no abnormal swellings NEUROLOGIC: Cranial nerves 2-12 grossly intact. Moves all extremities PSYCHIATRIC: Mood and affect appropriate to situation, no behavioral issues  Patient Active Problem List   Diagnosis Date Noted  . Chronic systolic CHF (congestive heart failure) ([place]) 10/31/2016  .  CAD (coronary artery disease) 10/31/2016  . Vitamin B12 deficiency 07/30/2016  . H/O colon cancer, stage I 07/03/2016  . Lung mass 04/21/2016  . Degenerative cervical spinal stenosis 03/01/2016  . Spinal cord compression due to degenerative disorder of spinal column ([place]) 03/01/2016  . Hypokalemia 03/01/2016  . Myelopathy ([place])   . Generalized weakness 02/19/2016  . Diaphragmatic injury as surgical complication 16/60/6301  . Postoperative anemia due to acute blood loss 08/14/2015  . Dementia without behavioral disturbance 08/14/2015  . Chronic UTI 07/29/2015  . 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 Klebsiella sepsis ([place]) 05/02/2015  . CAP (community acquired pneumonia) 05/02/2015  . Transaminasemia   . Sepsis ([place]) 04/22/2015  . Paroxysmal atrial fibrillation ([place]) 04/22/2015  . Delirium   . Urinary tract infectious disease   . Osteoarthritis 03/27/2015  . Cerebrovascular accident (CVA) due to thrombosis of  cerebral artery ([place]) 03/23/2015  . Moderate tricuspid regurgitation 03/11/2015  . Dilated cardiomyopathy ([place]) 03/11/2015  . Mitral regurgitation 03/11/2015  . Pulmonary arterial hypertension ([place]), mild 03/11/2015  . Mild cognitive impairment 02/24/2015  . Type 2 diabetes mellitus without complication, with long-term current use of insulin ([place]) 02/24/2015  . Hypertensive heart disease with CHF (congestive heart failure) ([place]) 02/24/2015  . Diabetes ([place]) 02/21/2015  . Essential hypertension, benign 02/21/2015  . Hyperlipidemia 02/19/2015  . Gout 02/19/2015  . PAD (peripheral artery disease) ([place]) 02/19/2015  . Cardiac pacemaker in situ 02/19/2015  . CKD (chronic kidney disease) 02/19/2015    CMP     Component Value Date/Time   NA 142 11/14/2016 1255   K 3.9 11/14/2016 1255   CL 98 11/14/2016 1255   CO2 24 11/14/2016 1255   GLUCOSE 199 (H) 11/14/2016 1255   GLUCOSE 139 (H) 02/23/2016 0605   BUN 25 11/14/2016 1255   CREATININE 1.16 (H) 11/14/2016 1255   CALCIUM 9.3 11/14/2016 1255   PROT 7.4 10/31/2016 1529   ALBUMIN 3.6 10/31/2016 1529   AST 29 10/31/2016 1529   ALT 33 (H) 10/31/2016 1529   ALKPHOS 179 (H) 10/31/2016 1529   BILITOT 0.4 10/31/2016 1529   GFRNONAA 44 (L) 11/14/2016 1255   GFRAA 50 (L) 11/14/2016 1255   Recent Labs    02/19/16 1633  02/23/16 0605  09/11/16 10/31/16 1529 11/14/16 1255  NA  --    < > 138   < > 141 142 142  K  --    < > 4.1   < > 4.3 3.7 3.9  CL  --    < > 101  --   --  96 98  CO2  --    < > 33*  --   --  27 24  GLUCOSE  --    < > 139*  --   --  324* 199*  BUN  --    < > 19   < > 25* 21 25  CREATININE  --    < > 1.12*   < > 1.2* 1.49* 1.16*  CALCIUM  --    < > 8.8*  --   --  9.1 9.3  MG 1.8  --   --   --   --   --   --    < > = values in this interval not displayed.   Recent Labs    09/11/16 10/31/16 1529  AST 20 29  ALT 17 33*  ALKPHOS 136* 179*  BILITOT  --  0.4  PROT  --  7.4  ALBUMIN  --  3.6   Recent Labs     02/19/16 1318  02/23/16 0605  04/03/16 09/11/16 10/31/16 1529  WBC 8.6   < > 7.4   < > 8.8 8.7 8.6  HGB 14.2   < > 12.3   < > 12.8 11.9* 12.4  HCT 41.7   < > 36.7   < > 41 37 39.3  MCV 90.7  --  92.0  --   --   --  92  PLT 218   < > 215   < > 228 193 282   < > = values in this interval not displayed.   Recent Labs    02/20/16 0440 04/03/16 09/11/16  CHOL 222* 234* 203*  LDLCALC 133* 135 121  TRIG 74 96 59   No results found for: [facility] Lab Results  Component Value Date   TSH 1.540 10/31/2016   Lab Results  Component Value Date   HGBA1C 8.6 09/11/2016   Lab Results  Component Value Date   CHOL 203 (A) 09/11/2016   HDL 70 09/11/2016   LDLCALC 121 09/11/2016   TRIG 59 09/11/2016   CHOLHDL 3.0 02/20/2016    Significant Diagnostic Results in last 30 days:  No results found.  Assessment and Plan  Concern for knot left hip-there was explored and palpated and there were no abnormal findings; patient was reassured     Webb Silversmith D. Sheppard Coil, MD

## 2017-01-22 DIAGNOSIS — L89621 Pressure ulcer of left heel, stage 1: Secondary | ICD-10-CM | POA: Diagnosis not present

## 2017-01-22 DIAGNOSIS — I70234 Atherosclerosis of native arteries of right leg with ulceration of heel and midfoot: Secondary | ICD-10-CM | POA: Diagnosis not present

## 2017-01-31 DIAGNOSIS — L89621 Pressure ulcer of left heel, stage 1: Secondary | ICD-10-CM | POA: Diagnosis not present

## 2017-01-31 DIAGNOSIS — I70234 Atherosclerosis of native arteries of right leg with ulceration of heel and midfoot: Secondary | ICD-10-CM | POA: Diagnosis not present

## 2017-02-02 ENCOUNTER — Non-Acute Institutional Stay (SKILLED_NURSING_FACILITY): Payer: Medicare Other | Admitting: Internal Medicine

## 2017-02-02 ENCOUNTER — Encounter: Payer: Self-pay | Admitting: Internal Medicine

## 2017-02-02 DIAGNOSIS — I2510 Atherosclerotic heart disease of native coronary artery without angina pectoris: Secondary | ICD-10-CM

## 2017-02-02 DIAGNOSIS — I110 Hypertensive heart disease with heart failure: Secondary | ICD-10-CM

## 2017-02-02 DIAGNOSIS — I495 Sick sinus syndrome: Secondary | ICD-10-CM | POA: Diagnosis not present

## 2017-02-02 DIAGNOSIS — I5022 Chronic systolic (congestive) heart failure: Secondary | ICD-10-CM

## 2017-02-02 NOTE — Progress Notes (Signed)
Location:  [place] Room Number: [place] of Service:  SNF ([phone])  Hennie Duos, MD  Patient Care Team: Hennie Duos, MD as PCP - General (Internal Medicine) Jola Schmidt, MD as Consulting Physician (Ophthalmology) Cameron Sprang, MD as Consulting Physician (Neurology) Evans Lance, MD as Consulting Physician (Cardiology) Juanito Doom, MD as Consulting Physician (Pulmonary Disease)  Extended Emergency Contact Information Primary Emergency Contact: [name],[name] Address: [address]          [place], [place] [address] [name] of [place] Phone: [phone] Mobile Phone: [phone] Relation: Sister    Allergies: Patient has no known allergies.  Chief Complaint  Patient presents with  . Medical Management of Chronic Issues    routine visit  . Health Maintenance    order written for Prevmar vaccine 0.5 ml to be given    HPI: Patient is 83 y.o. female who is being seen for routine issues of sinus node dysfunction, coronary artery disease, and hypertension.  Past Medical History:  Diagnosis Date  . Arthritis   . Cancer [facility])    breast, left; s/p lumpectomy/chemo/radiation  . CAP (community acquired pneumonia) 05/02/2015  . Cardiac pacemaker in situ 02/19/2015  . Cerebrovascular accident (CVA) due to thrombosis of cerebral artery ([place]) 03/23/2015  . CKD (chronic kidney disease) 02/19/2015  . Degenerative cervical spinal stenosis 03/01/2016  . Dementia without behavioral disturbance 08/14/2015  . Depression   . Diabetes mellitus without complication ([place])   . Gout 02/19/2015  . H/O colon cancer, stage I 07/03/2016   s/p resection 2013 - colonic mucosa with high-grade dysplasia/adenocarcinoma in situ  . Hyperlipidemia   . Hypertension   . Hypertensive heart disease with CHF (congestive heart failure) ([place]) 02/24/2015  . Hypokalemia 03/01/2016  . Lung mass 04/21/2016  . Mild cognitive impairment 02/24/2015  .  Mild mitral regurgitation 03/11/2015  . Moderate tricuspid regurgitation 03/11/2015   02/2015   . Osteoarthritis 03/27/2015  . PAD (peripheral artery disease) ([place]) 02/19/2015   S/p stent in left leg   . Paroxysmal atrial fibrillation (HCC)   . Presence of permanent cardiac pacemaker   . Pulmonary arterial hypertension ([place]), mild 03/11/2015  . Renal disorder   . Spinal cord compression due to degenerative disorder of spinal column ([place]) 03/01/2016  . Transaminasemia   . Type 2 diabetes mellitus without complication, with long-term current use of insulin ([place]) 02/24/2015  . Vitamin B12 deficiency 07/30/2016  . 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 BREAST LUMPECTOMY Left   . CARDIAC PACEMAKER PLACEMENT    . CATARACT EXTRACTION    . EYE SURGERY    . kidney stone removal     x3  . LAPAROSCOPIC NEPHRECTOMY Left 07/29/2015   Procedure: LEFT LAPAROSCOPIC SIMPLE NEPHRECTOMY;  Surgeon: Ardis Hughs, MD;  [facility];  Service: Urology;  Laterality: Left;  . LUMBAR SPINE SURGERY     laminectomy and rod    Allergies as of 02/02/2017   No Known Allergies     Medication List        Accurate as of 02/02/17 11:59 PM. Always use your most recent med list.          acetaminophen 325 MG tablet Commonly known as:  TYLENOL Take 650 mg by mouth every 6 (six) hours as needed.   allopurinol 300 MG tablet Commonly known as:  ZYLOPRIM TAKE ONE TABLET BY MOUTH ONCE DAILY  amiodarone 200 MG tablet Commonly known as:  PACERONE Take 1 tablet (200 mg total) by mouth daily.   amLODipine 5 MG tablet Commonly known as:  NORVASC Take 1 tablet (5 mg total) by mouth daily.   apixaban 2.5 MG Tabs tablet Commonly known as:  ELIQUIS Take 2.5 mg by mouth 2 (two) times daily.   atorvastatin 80 MG tablet Commonly known as:  LIPITOR Take 1 tablet (80 mg total) by mouth daily with supper.   benazepril 20 MG tablet Commonly known  as:  LOTENSIN Take 20 mg by mouth daily.   carvedilol 3.125 MG tablet Commonly known as:  COREG Take 1 tablet (3.125 mg total) by mouth 2 (two) times daily with a meal.   donepezil 10 MG tablet Commonly known as:  ARICEPT Take 1 tablet (10 mg total) by mouth at bedtime.   furosemide 40 MG tablet Commonly known as:  LASIX Take 40 mg by mouth. Take one tablet daily   HUMALOG KWIKPEN 100 UNIT/ML KiwkPen Generic drug:  insulin lispro Inject 5 Units into the skin 3 (three) times daily before meals. When CBG is greater than 200; inject 8 units with a CBG greater than 300   meclizine 12.5 MG tablet Commonly known as:  ANTIVERT Take 12.5 mg by mouth 3 (three) times daily as needed for dizziness or nausea.   PROSTATE PO Take by mouth. Take 30 ml daily to promote wound healing   TOUJEO SOLOSTAR 300 UNIT/ML Sopn Generic drug:  Insulin Glargine Inject 4 Units into the skin at bedtime.   WOMENS MULTI VITAMIN & MINERAL PO Take 1 tablet by mouth daily with lunch.       No orders of the defined types were placed in this encounter.   Immunization History  Administered Date(s) Administered  . Influenza, High Dose Seasonal PF 12/30/2015  . Influenza-Unspecified 12/01/2016  . PPD Test 04/26/2015, 08/02/2015, 02/23/2016    Social History   Tobacco Use  . Smoking status: Passive Smoke Exposure - Never Smoker  . Smokeless tobacco: Never Used  . Tobacco comment: pt's spouse was heavy smoker, smoked in home.   Substance Use Topics  . Alcohol use: No    Alcohol/week: 0.0 oz    Review of Systems  DATA OBTAINED: from patient, nurse GENERAL:  no fevers, fatigue, appetite changes SKIN: No itching, rash HEENT: No complaint RESPIRATORY: No cough, wheezing, SOB CARDIAC: No chest pain, palpitations, lower extremity edema  GI: No abdominal pain, No N/V/D or constipation, No heartburn or reflux  GU: No dysuria, frequency or urgency, or incontinence  MUSCULOSKELETAL: No unrelieved  bone/joint pain NEUROLOGIC: No headache, dizziness  PSYCHIATRIC: No overt anxiety or sadness  Vitals:   02/02/17 1536  BP: 140/71  Pulse: 60  Resp: 16  Temp: (!) 97.3 F (36.3 C)  SpO2: 95%   Body mass index is 26.95 kg/m. Physical Exam  GENERAL APPEARANCE: Alert, conversant, No acute distress  SKIN: No diaphoresis rash HEENT: Unremarkable RESPIRATORY: Breathing is even, unlabored. Lung sounds are clear   CARDIOVASCULAR: Heart RRR no murmurs, rubs or gallops. No peripheral edema  GASTROINTESTINAL: Abdomen is soft, non-tender, not distended w/ normal bowel sounds.  GENITOURINARY: Bladder non tender, not distended  MUSCULOSKELETAL: No abnormal joints or musculature NEUROLOGIC: Cranial nerves 2-12 grossly intact. Moves all extremities PSYCHIATRIC: Mood and affect appropriate to situation, no behavioral issues  Patient Active Problem List   Diagnosis Date Noted  . Sinus node dysfunction ([place]) 02/18/2017  . Chronic systolic CHF (congestive heart failure) (  [place]) 10/31/2016  . CAD (coronary artery disease) 10/31/2016  . Vitamin B12 deficiency 07/30/2016  . H/O colon cancer, stage I 07/03/2016  . Lung mass 04/21/2016  . Degenerative cervical spinal stenosis 03/01/2016  . Spinal cord compression due to degenerative disorder of spinal column ([place]) 03/01/2016  . Hypokalemia 03/01/2016  . Myelopathy ([place])   . Generalized weakness 02/19/2016  . Diaphragmatic injury as surgical complication 40/98/1191  . Postoperative anemia due to acute blood loss 08/14/2015  . Dementia without behavioral disturbance 08/14/2015  . Chronic UTI 07/29/2015  . 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 Klebsiella sepsis ([place]) 05/02/2015  . CAP (community acquired pneumonia) 05/02/2015  . Transaminasemia   . Sepsis ([place]) 04/22/2015  . Paroxysmal atrial fibrillation ([place]) 04/22/2015  . Delirium   . Urinary tract infectious disease   . Osteoarthritis 03/27/2015  . Cerebrovascular accident (CVA)  due to thrombosis of cerebral artery ([place]) 03/23/2015  . Moderate tricuspid regurgitation 03/11/2015  . Dilated cardiomyopathy ([place]) 03/11/2015  . Mitral regurgitation 03/11/2015  . Pulmonary arterial hypertension ([place]), mild 03/11/2015  . Mild cognitive impairment 02/24/2015  . Type 2 diabetes mellitus without complication, with long-term current use of insulin ([place]) 02/24/2015  . Hypertensive heart disease with CHF (congestive heart failure) ([place]) 02/24/2015  . Diabetes ([place]) 02/21/2015  . Essential hypertension, benign 02/21/2015  . Hyperlipidemia 02/19/2015  . Gout 02/19/2015  . PAD (peripheral artery disease) ([place]) 02/19/2015  . Cardiac pacemaker in situ 02/19/2015  . CKD (chronic kidney disease) 02/19/2015    CMP     Component Value Date/Time   NA 142 11/14/2016 1255   K 3.9 11/14/2016 1255   CL 98 11/14/2016 1255   CO2 24 11/14/2016 1255   GLUCOSE 199 (H) 11/14/2016 1255   GLUCOSE 139 (H) 02/23/2016 0605   BUN 25 11/14/2016 1255   CREATININE 1.16 (H) 11/14/2016 1255   CALCIUM 9.3 11/14/2016 1255   PROT 7.4 10/31/2016 1529   ALBUMIN 3.6 10/31/2016 1529   AST 29 10/31/2016 1529   ALT 33 (H) 10/31/2016 1529   ALKPHOS 179 (H) 10/31/2016 1529   BILITOT 0.4 10/31/2016 1529   GFRNONAA 44 (L) 11/14/2016 1255   GFRAA 50 (L) 11/14/2016 1255   Recent Labs    02/19/16 1633  02/23/16 0605  09/11/16 10/31/16 1529 11/14/16 1255  NA  --    < > 138   < > 141 142 142  K  --    < > 4.1   < > 4.3 3.7 3.9  CL  --    < > 101  --   --  96 98  CO2  --    < > 33*  --   --  27 24  GLUCOSE  --    < > 139*  --   --  324* 199*  BUN  --    < > 19   < > 25* 21 25  CREATININE  --    < > 1.12*   < > 1.2* 1.49* 1.16*  CALCIUM  --    < > 8.8*  --   --  9.1 9.3  MG 1.8  --   --   --   --   --   --    < > = values in this interval not displayed.   Recent Labs    09/11/16 10/31/16 1529  AST 20 29  ALT 17 33*  ALKPHOS 136* 179*  BILITOT  --  0.4  PROT  --  7.4  ALBUMIN  --  3.6    Recent Labs    02/19/16 1318  02/23/16 0605  04/03/16 09/11/16 10/31/16 1529  WBC 8.6   < > 7.4   < > 8.8 8.7 8.6  HGB 14.2   < > 12.3   < > 12.8 11.9* 12.4  HCT 41.7   < > 36.7   < > 41 37 39.3  MCV 90.7  --  92.0  --   --   --  92  PLT 218   < > 215   < > 228 193 282   < > = values in this interval not displayed.   Recent Labs    02/20/16 0440 04/03/16 09/11/16  CHOL 222* 234* 203*  LDLCALC 133* 135 121  TRIG 74 96 59   No results found for: [facility] Lab Results  Component Value Date   TSH 1.540 10/31/2016   Lab Results  Component Value Date   HGBA1C 8.6 09/11/2016   Lab Results  Component Value Date   CHOL 203 (A) 09/11/2016   HDL 70 09/11/2016   LDLCALC 121 09/11/2016   TRIG 59 09/11/2016   CHOLHDL 3.0 02/20/2016    Significant Diagnostic Results in last 30 days:  No results found.  Assessment and Plan  Sinus node dysfunction (HCC) Bradycardia, status post PPM placement; she has no problems, has been seen by cardiology with a one-year follow-up  Hypertensive heart disease with CHF (congestive heart failure) (HCC) BP controlled; continue Lotensin 20 mg by mouth daily, Coreg 3.125 by mouth twice a day, Norvasc 5 mg daily and Lasix 40 mg daily  CAD (coronary artery disease) No chest pain or:; Continue Coreg 3.125 mg by mouth twice a day, Eliquis 2.5 mg by mouth twice a day and patient is on statin    Webb Silversmith D. Sheppard Coil, MD

## 2017-02-05 DIAGNOSIS — I70234 Atherosclerosis of native arteries of right leg with ulceration of heel and midfoot: Secondary | ICD-10-CM | POA: Diagnosis not present

## 2017-02-05 DIAGNOSIS — L89621 Pressure ulcer of left heel, stage 1: Secondary | ICD-10-CM | POA: Diagnosis not present

## 2017-02-08 ENCOUNTER — Non-Acute Institutional Stay (SKILLED_NURSING_FACILITY): Payer: Medicare Other | Admitting: Internal Medicine

## 2017-02-08 DIAGNOSIS — Z794 Long term (current) use of insulin: Secondary | ICD-10-CM

## 2017-02-08 DIAGNOSIS — E1165 Type 2 diabetes mellitus with hyperglycemia: Secondary | ICD-10-CM

## 2017-02-12 DIAGNOSIS — I70244 Atherosclerosis of native arteries of left leg with ulceration of heel and midfoot: Secondary | ICD-10-CM | POA: Diagnosis not present

## 2017-02-12 DIAGNOSIS — I70234 Atherosclerosis of native arteries of right leg with ulceration of heel and midfoot: Secondary | ICD-10-CM | POA: Diagnosis not present

## 2017-02-18 ENCOUNTER — Encounter: Payer: Self-pay | Admitting: Internal Medicine

## 2017-02-18 DIAGNOSIS — I495 Sick sinus syndrome: Secondary | ICD-10-CM | POA: Insufficient documentation

## 2017-02-18 NOTE — Assessment & Plan Note (Signed)
Bradycardia, status post PPM placement; she has no problems, has been seen by cardiology with a one-year follow-up

## 2017-02-18 NOTE — Assessment & Plan Note (Signed)
BP controlled; continue Lotensin 20 mg by mouth daily, Coreg 3.125 by mouth twice a day, Norvasc 5 mg daily and Lasix 40 mg daily

## 2017-02-18 NOTE — Assessment & Plan Note (Signed)
No chest pain or:; Continue Coreg 3.125 mg by mouth twice a day, Eliquis 2.5 mg by mouth twice a day and patient is on statin

## 2017-02-19 ENCOUNTER — Non-Acute Institutional Stay (SKILLED_NURSING_FACILITY): Payer: Medicare Other | Admitting: Internal Medicine

## 2017-02-19 DIAGNOSIS — I70234 Atherosclerosis of native arteries of right leg with ulceration of heel and midfoot: Secondary | ICD-10-CM | POA: Diagnosis not present

## 2017-02-19 DIAGNOSIS — I10 Essential (primary) hypertension: Secondary | ICD-10-CM

## 2017-02-19 DIAGNOSIS — I70244 Atherosclerosis of native arteries of left leg with ulceration of heel and midfoot: Secondary | ICD-10-CM | POA: Diagnosis not present

## 2017-02-21 ENCOUNTER — Encounter: Payer: Self-pay | Admitting: Internal Medicine

## 2017-02-21 NOTE — Progress Notes (Signed)
Location:  Post Room Number: [place] of Service:  SNF (31)  Hennie Duos, MD  Patient Care Team: Hennie Duos, MD as PCP - General (Internal Medicine) Jola Schmidt, MD as Consulting Physician (Ophthalmology) Cameron Sprang, MD as Consulting Physician (Neurology) Evans Lance, MD as Consulting Physician (Cardiology) Juanito Doom, MD as Consulting Physician (Pulmonary Disease)  Extended Emergency Contact Information Primary Emergency Contact: [name],[name] Address: [address]          [place], [place] [address] [name] of [place] Phone: [phone] Mobile Phone: [phone] Relation: Sister    Allergies: Patient has no known allergies.  Chief Complaint  Patient presents with  . Acute Visit    increase blood pressure    HPI: Patient is 84 y.o. female who nursing asked me to see because her blood pressure is been running high. No reported chest pain and shortness of breath headaches or other symptom of high blood pressure.  Past Medical History:  Diagnosis Date  . Arthritis   . Cancer [facility])    breast, left; s/p lumpectomy/chemo/radiation  . CAP (community acquired pneumonia) 05/02/2015  . Cardiac pacemaker in situ 02/19/2015  . Cerebrovascular accident (CVA) due to thrombosis of cerebral artery ([place]) 03/23/2015  . CKD (chronic kidney disease) 02/19/2015  . Degenerative cervical spinal stenosis 03/01/2016  . Dementia without behavioral disturbance 08/14/2015  . Depression   . Diabetes mellitus without complication ([place])   . Gout 02/19/2015  . H/O colon cancer, stage I 07/03/2016   s/p resection 2013 - colonic mucosa with high-grade dysplasia/adenocarcinoma in situ  . Hyperlipidemia   . Hypertension   . Hypertensive heart disease with CHF (congestive heart failure) ([place]) 02/24/2015  . Hypokalemia 03/01/2016  . Lung mass 04/21/2016  . Mild cognitive impairment 02/24/2015  . Mild mitral regurgitation  03/11/2015  . Moderate tricuspid regurgitation 03/11/2015   02/2015   . Osteoarthritis 03/27/2015  . PAD (peripheral artery disease) ([place]) 02/19/2015   S/p stent in left leg   . Paroxysmal atrial fibrillation (HCC)   . Presence of permanent cardiac pacemaker   . Pulmonary arterial hypertension ([place]), mild 03/11/2015  . Renal disorder   . Spinal cord compression due to degenerative disorder of spinal column ([place]) 03/01/2016  . Transaminasemia   . Type 2 diabetes mellitus without complication, with long-term current use of insulin ([place]) 02/24/2015  . Vitamin B12 deficiency 07/30/2016  . 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 BREAST LUMPECTOMY Left   . CARDIAC PACEMAKER PLACEMENT    . CATARACT EXTRACTION    . EYE SURGERY    . kidney stone removal     x3  . LAPAROSCOPIC NEPHRECTOMY Left 07/29/2015   Procedure: LEFT LAPAROSCOPIC SIMPLE NEPHRECTOMY;  Surgeon: Ardis Hughs, MD;  Location: [facility] ORS;  Service: Urology;  Laterality: Left;  . LUMBAR SPINE SURGERY     laminectomy and rod    Allergies as of 02/19/2017   No Known Allergies     Medication List        Accurate as of 02/19/17 11:59 PM. Always use your most recent med list.          acetaminophen 325 MG tablet Commonly known as:  TYLENOL Take 650 mg by mouth every 6 (six) hours as needed.   allopurinol 300 MG tablet Commonly known as:  ZYLOPRIM TAKE ONE TABLET BY MOUTH ONCE DAILY   amiodarone 200 MG tablet  Commonly known as:  PACERONE Take 1 tablet (200 mg total) by mouth daily.   amLODipine 5 MG tablet Commonly known as:  NORVASC Take 1 tablet (5 mg total) by mouth daily.   apixaban 2.5 MG Tabs tablet Commonly known as:  ELIQUIS Take 2.5 mg by mouth 2 (two) times daily.   atorvastatin 80 MG tablet Commonly known as:  LIPITOR Take 1 tablet (80 mg total) by mouth daily with supper.   benazepril 20 MG tablet Commonly known as:  LOTENSIN Take 20 mg  by mouth daily.   carvedilol 3.125 MG tablet Commonly known as:  COREG Take 1 tablet (3.125 mg total) by mouth 2 (two) times daily with a meal.   donepezil 10 MG tablet Commonly known as:  ARICEPT Take 1 tablet (10 mg total) by mouth at bedtime.   furosemide 40 MG tablet Commonly known as:  LASIX Take 40 mg by mouth. Take one tablet daily   HUMALOG KWIKPEN 100 UNIT/ML KiwkPen Generic drug:  insulin lispro Inject 5 Units into the skin 3 (three) times daily before meals. When CBG is greater than 200; inject 8 units with a CBG greater than 300   meclizine 12.5 MG tablet Commonly known as:  ANTIVERT Take 12.5 mg by mouth 3 (three) times daily as needed for dizziness or nausea.   PROSTATE PO Take by mouth. Take 30 ml daily to promote wound healing   TOUJEO SOLOSTAR 300 UNIT/ML Sopn Generic drug:  Insulin Glargine Inject 4 Units into the skin at bedtime.   WOMENS MULTI VITAMIN & MINERAL PO Take 1 tablet by mouth daily with lunch.       No orders of the defined types were placed in this encounter.   Immunization History  Administered Date(s) Administered  . Influenza, High Dose Seasonal PF 12/30/2015  . Influenza-Unspecified 12/01/2016  . PPD Test 04/26/2015, 08/02/2015, 02/23/2016    Social History   Tobacco Use  . Smoking status: Passive Smoke Exposure - Never Smoker  . Smokeless tobacco: Never Used  . Tobacco comment: pt's spouse was heavy smoker, smoked in home.   Substance Use Topics  . Alcohol use: No    Alcohol/week: 0.0 oz    Review of Systems  DATA OBTAINED: from patient, nurse GENERAL:  no fevers, fatigue, appetite changes SKIN: No itching, rash HEENT: No complaint RESPIRATORY: No cough, wheezing, SOB CARDIAC: No chest pain, palpitations, lower extremity edema  GI: No abdominal pain, No N/V/D or constipation, No heartburn or reflux  GU: No dysuria, frequency or urgency, or incontinence  MUSCULOSKELETAL: No unrelieved bone/joint pain NEUROLOGIC: No  headache, dizziness  PSYCHIATRIC: No overt anxiety or sadness  Vitals:   02/19/17 1606  BP: (!) 152/73  Pulse: 78  Resp: 18  Temp: 98.2 F (36.8 C)  SpO2: 97%   Body mass index is 26.95 kg/m. Physical Exam  GENERAL APPEARANCE: Alert, conversant, No acute distress  SKIN: No diaphoresis rash HEENT: Unremarkable RESPIRATORY: Breathing is even, unlabored. Lung sounds are clear   CARDIOVASCULAR: Heart RRR no murmurs, rubs or gallops. No peripheral edema  GASTROINTESTINAL: Abdomen is soft, non-tender, not distended w/ normal bowel sounds.  GENITOURINARY: Bladder non tender, not distended  MUSCULOSKELETAL: No abnormal joints or musculature NEUROLOGIC: Cranial nerves 2-12 grossly intact. Moves all extremities PSYCHIATRIC: Mood and affect appropriate to situation, no behavioral issues  Patient Active Problem List   Diagnosis Date Noted  . Sinus node dysfunction ([place]) 02/18/2017  . Chronic systolic CHF (congestive heart failure) ([place]) 10/31/2016  .  CAD (coronary artery disease) 10/31/2016  . Vitamin B12 deficiency 07/30/2016  . H/O colon cancer, stage I 07/03/2016  . Lung mass 04/21/2016  . Degenerative cervical spinal stenosis 03/01/2016  . Spinal cord compression due to degenerative disorder of spinal column ([place]) 03/01/2016  . Hypokalemia 03/01/2016  . Myelopathy ([place])   . Generalized weakness 02/19/2016  . Diaphragmatic injury as surgical complication 48/54/6270  . Postoperative anemia due to acute blood loss 08/14/2015  . Dementia without behavioral disturbance 08/14/2015  . Chronic UTI 07/29/2015  . 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 Klebsiella sepsis ([place]) 05/02/2015  . CAP (community acquired pneumonia) 05/02/2015  . Transaminasemia   . Sepsis ([place]) 04/22/2015  . Paroxysmal atrial fibrillation ([place]) 04/22/2015  . Delirium   . Urinary tract infectious disease   . Osteoarthritis 03/27/2015  . Cerebrovascular accident (CVA) due to thrombosis of cerebral  artery ([place]) 03/23/2015  . Moderate tricuspid regurgitation 03/11/2015  . Dilated cardiomyopathy ([place]) 03/11/2015  . Mitral regurgitation 03/11/2015  . Pulmonary arterial hypertension ([place]), mild 03/11/2015  . Mild cognitive impairment 02/24/2015  . Type 2 diabetes mellitus without complication, with long-term current use of insulin ([place]) 02/24/2015  . Hypertensive heart disease with CHF (congestive heart failure) ([place]) 02/24/2015  . Diabetes ([place]) 02/21/2015  . Essential hypertension, benign 02/21/2015  . Hyperlipidemia 02/19/2015  . Gout 02/19/2015  . PAD (peripheral artery disease) ([place]) 02/19/2015  . Cardiac pacemaker in situ 02/19/2015  . CKD (chronic kidney disease) 02/19/2015    CMP     Component Value Date/Time   NA 142 11/14/2016 1255   K 3.9 11/14/2016 1255   CL 98 11/14/2016 1255   CO2 24 11/14/2016 1255   GLUCOSE 199 (H) 11/14/2016 1255   GLUCOSE 139 (H) 02/23/2016 0605   BUN 25 11/14/2016 1255   CREATININE 1.16 (H) 11/14/2016 1255   CALCIUM 9.3 11/14/2016 1255   PROT 7.4 10/31/2016 1529   ALBUMIN 3.6 10/31/2016 1529   AST 29 10/31/2016 1529   ALT 33 (H) 10/31/2016 1529   ALKPHOS 179 (H) 10/31/2016 1529   BILITOT 0.4 10/31/2016 1529   GFRNONAA 44 (L) 11/14/2016 1255   GFRAA 50 (L) 11/14/2016 1255   Recent Labs    02/23/16 0605  09/11/16 10/31/16 1529 11/14/16 1255  NA 138   < > 141 142 142  K 4.1   < > 4.3 3.7 3.9  CL 101  --   --  96 98  CO2 33*  --   --  27 24  GLUCOSE 139*  --   --  324* 199*  BUN 19   < > 25* 21 25  CREATININE 1.12*   < > 1.2* 1.49* 1.16*  CALCIUM 8.8*  --   --  9.1 9.3   < > = values in this interval not displayed.   Recent Labs    09/11/16 10/31/16 1529  AST 20 29  ALT 17 33*  ALKPHOS 136* 179*  BILITOT  --  0.4  PROT  --  7.4  ALBUMIN  --  3.6   Recent Labs    02/23/16 0605  04/03/16 09/11/16 10/31/16 1529  WBC 7.4   < > 8.8 8.7 8.6  HGB 12.3   < > 12.8 11.9* 12.4  HCT 36.7   < > 41 37 39.3  MCV 92.0  --   --    --  92  PLT 215   < > 228 193 282   < > = values in this interval not  displayed.   Recent Labs    04/03/16 09/11/16  CHOL 234* 203*  LDLCALC 135 121  TRIG 96 59   No results found for: [facility] Lab Results  Component Value Date   TSH 1.540 10/31/2016   Lab Results  Component Value Date   HGBA1C 8.6 09/11/2016   Lab Results  Component Value Date   CHOL 203 (A) 09/11/2016   HDL 70 09/11/2016   LDLCALC 121 09/11/2016   TRIG 59 09/11/2016   CHOLHDL 3.0 02/20/2016    Significant Diagnostic Results in last 30 days:  No results found.  Assessment and Plan  Hypertension/elevated blood pressure-SBP has been running in the 150s; patient is on Coreg 3.125 twice a day, Norvasc 5 mg daily Lasix 40 mg daily and Benzapril 20 mg daily; patient's pulse is not high enough to increase her beta blocker, patient does have lower extremity edema so increasing her Norvasc is not a good idea, in fact we will stop it; will increase Benzapril from 20 mg daily to 40 mg daily; will monitor response  No problem-specific Assessment & Plan notes found for this encounter.   Labs/tests ordered:    Noah Delaine. Sheppard Coil, MD

## 2017-02-23 ENCOUNTER — Non-Acute Institutional Stay (SKILLED_NURSING_FACILITY): Payer: Medicare Other

## 2017-02-23 DIAGNOSIS — Z0000 Encounter for general adult medical examination without abnormal findings: Secondary | ICD-10-CM | POA: Diagnosis not present

## 2017-02-23 NOTE — Patient Instructions (Signed)
Allison Dunn , Thank you for taking time to come for your Medicare Wellness Visit. I appreciate your ongoing commitment to your health goals. Please review the following plan we discussed and let me know if I can assist you in the future.   Screening recommendations/referrals: Colonoscopy excluded, pt is over age 82 Mammogram excluded, pt is over age 81 Bone Density due, ordered Recommended yearly ophthalmology/optometry visit for glaucoma screening and checkup Recommended yearly dental visit for hygiene and checkup  Vaccinations: Influenza vaccine up to date. Due 2019 fall season Pneumococcal vaccine 13 due, ordered Tdap vaccine due, ordered Shingles vaccine not in records    Advanced directives: In Chart  Conditions/risks identified: None  Next appointment: Dr. Sheppard Coil makes rounds   Preventive Care 65 Years and Older, Female Preventive care refers to lifestyle choices and visits with your health care provider that can promote health and wellness. What does preventive care include?  A yearly physical exam. This is also called an annual well check.  Dental exams once or twice a year.  Routine eye exams. Ask your health care provider how often you should have your eyes checked.  Personal lifestyle choices, including:  Daily care of your teeth and gums.  Regular physical activity.  Eating a healthy diet.  Avoiding tobacco and drug use.  Limiting alcohol use.  Practicing safe sex.  Taking low-dose aspirin every day.  Taking vitamin and mineral supplements as recommended by your health care provider. What happens during an annual well check? The services and screenings done by your health care provider during your annual well check will depend on your age, overall health, lifestyle risk factors, and family history of disease. Counseling  Your health care provider may ask you questions about your:  Alcohol use.  Tobacco use.  Drug use.  Emotional  well-being.  Home and relationship well-being.  Sexual activity.  Eating habits.  History of falls.  Memory and ability to understand (cognition).  Work and work Statistician.  Reproductive health. Screening  You may have the following tests or measurements:  Height, weight, and BMI.  Blood pressure.  Lipid and cholesterol levels. These may be checked every 5 years, or more frequently if you are over 78 years old.  Skin check.  Lung cancer screening. You may have this screening every year starting at age 71 if you have a 30-pack-year history of smoking and currently smoke or have quit within the past 15 years.  Fecal occult blood test (FOBT) of the stool. You may have this test every year starting at age 32.  Flexible sigmoidoscopy or colonoscopy. You may have a sigmoidoscopy every 5 years or a colonoscopy every 10 years starting at age 14.  Hepatitis C blood test.  Hepatitis B blood test.  Sexually transmitted disease (STD) testing.  Diabetes screening. This is done by checking your blood sugar (glucose) after you have not eaten for a while (fasting). You may have this done every 1-3 years.  Bone density scan. This is done to screen for osteoporosis. You may have this done starting at age 106.  Mammogram. This may be done every 1-2 years. Talk to your health care provider about how often you should have regular mammograms. Talk with your health care provider about your test results, treatment options, and if necessary, the need for more tests. Vaccines  Your health care provider may recommend certain vaccines, such as:  Influenza vaccine. This is recommended every year.  Tetanus, diphtheria, and acellular pertussis (Tdap, Td) vaccine.  You may need a Td booster every 10 years.  Zoster vaccine. You may need this after age 22.  Pneumococcal 13-valent conjugate (PCV13) vaccine. One dose is recommended after age 76.  Pneumococcal polysaccharide (PPSV23) vaccine. One  dose is recommended after age 69. Talk to your health care provider about which screenings and vaccines you need and how often you need them. This information is not intended to replace advice given to you by your health care provider. Make sure you discuss any questions you have with your health care provider. Document Released: 03/05/2015 Document Revised: 10/27/2015 Document Reviewed: 12/08/2014 Elsevier Interactive Patient Education  2017 [place] Prevention in the Home Falls can cause injuries. They can happen to people of all ages. There are many things you can do to make your home safe and to help prevent falls. What can I do on the outside of my home?  Regularly fix the edges of walkways and driveways and fix any cracks.  Remove anything that might make you trip as you walk through a door, such as a raised step or threshold.  Trim any bushes or trees on the path to your home.  Use bright outdoor lighting.  Clear any walking paths of anything that might make someone trip, such as rocks or tools.  Regularly check to see if handrails are loose or broken. Make sure that both sides of any steps have handrails.  Any raised decks and porches should have guardrails on the edges.  Have any leaves, snow, or ice cleared regularly.  Use sand or salt on walking paths during winter.  Clean up any spills in your garage right away. This includes oil or grease spills. What can I do in the bathroom?  Use night lights.  Install grab bars by the toilet and in the tub and shower. Do not use towel bars as grab bars.  Use non-skid mats or decals in the tub or shower.  If you need to sit down in the shower, use a plastic, non-slip stool.  Keep the floor dry. Clean up any water that spills on the floor as soon as it happens.  Remove soap buildup in the tub or shower regularly.  Attach bath mats securely with double-sided non-slip rug tape.  Do not have throw rugs and other  things on the floor that can make you trip. What can I do in the bedroom?  Use night lights.  Make sure that you have a light by your bed that is easy to reach.  Do not use any sheets or blankets that are too big for your bed. They should not hang down onto the floor.  Have a firm chair that has side arms. You can use this for support while you get dressed.  Do not have throw rugs and other things on the floor that can make you trip. What can I do in the kitchen?  Clean up any spills right away.  Avoid walking on wet floors.  Keep items that you use a lot in easy-to-reach places.  If you need to reach something above you, use a strong step stool that has a grab bar.  Keep electrical cords out of the way.  Do not use floor polish or wax that makes floors slippery. If you must use wax, use non-skid floor wax.  Do not have throw rugs and other things on the floor that can make you trip. What can I do with my stairs?  Do not leave any  items on the stairs.  Make sure that there are handrails on both sides of the stairs and use them. Fix handrails that are broken or loose. Make sure that handrails are as long as the stairways.  Check any carpeting to make sure that it is firmly attached to the stairs. Fix any carpet that is loose or worn.  Avoid having throw rugs at the top or bottom of the stairs. If you do have throw rugs, attach them to the floor with carpet tape.  Make sure that you have a light switch at the top of the stairs and the bottom of the stairs. If you do not have them, ask someone to add them for you. What else can I do to help prevent falls?  Wear shoes that:  Do not have high heels.  Have rubber bottoms.  Are comfortable and fit you well.  Are closed at the toe. Do not wear sandals.  If you use a stepladder:  Make sure that it is fully opened. Do not climb a closed stepladder.  Make sure that both sides of the stepladder are locked into place.  Ask  someone to hold it for you, if possible.  Clearly mark and make sure that you can see:  Any grab bars or handrails.  First and last steps.  Where the edge of each step is.  Use tools that help you move around (mobility aids) if they are needed. These include:  Canes.  Walkers.  Scooters.  Crutches.  Turn on the lights when you go into a dark area. Replace any light bulbs as soon as they burn out.  Set up your furniture so you have a clear path. Avoid moving your furniture around.  If any of your floors are uneven, fix them.  If there are any pets around you, be aware of where they are.  Review your medicines with your doctor. Some medicines can make you feel dizzy. This can increase your chance of falling. Ask your doctor what other things that you can do to help prevent falls. This information is not intended to replace advice given to you by your health care provider. Make sure you discuss any questions you have with your health care provider. Document Released: 12/03/2008 Document Revised: 07/15/2015 Document Reviewed: 03/13/2014 Elsevier Interactive Patient Education  2017 [employer].

## 2017-02-23 NOTE — Progress Notes (Signed)
Subjective:   Allison Dunn is a 84 y.o. female who presents for Medicare Annual (Subsequent) preventive examination at [facility]  Last AWV-12/30/2015    Objective:     Vitals: BP (!) 145/70 (BP Location: Left Arm, Patient Position: Sitting)   Pulse (!) 59   Temp 98.1 F (36.7 C) (Oral)   Ht 5' (1.524 m)   Wt 138 lb (62.6 kg)   LMP  (LMP Unknown)   SpO2 96%   BMI 26.95 kg/m   Body mass index is 26.95 kg/m.  Advanced Directives 02/23/2017 02/19/2017 02/02/2017 01/18/2017 01/08/2017 01/04/2017 12/01/2016  Does Patient Have a Medical Advance Directive? Yes Yes Yes Yes Yes Yes Yes  Type of Advance Directive Out of facility DNR (pink MOST or yellow form) Out of facility DNR (pink MOST or yellow form) Out of facility DNR (pink MOST or yellow form) Out of facility DNR (pink MOST or yellow form) Out of facility DNR (pink MOST or yellow form) Out of facility DNR (pink MOST or yellow form) Out of facility DNR (pink MOST or yellow form)  Does patient want to make changes to medical advance directive? No - Patient declined - - - - - -  Copy of Press photographer in Chart? - - - - - - -  Would patient like information on creating a medical advance directive? - - - - - - -  Pre-existing out of facility DNR order (yellow form or pink MOST form) Yellow form placed in chart (order not valid for inpatient use) Yellow form placed in chart (order not valid for inpatient use) Yellow form placed in chart (order not valid for inpatient use) Yellow form placed in chart (order not valid for inpatient use) Yellow form placed in chart (order not valid for inpatient use) Yellow form placed in chart (order not valid for inpatient use) Yellow form placed in chart (order not valid for inpatient use)    Tobacco Social History   Tobacco Use  Smoking Status Passive Smoke Exposure - Never Smoker  Smokeless Tobacco Never Used  Tobacco Comment   pt's spouse was heavy smoker, smoked in  home.      Counseling given: Not Answered Comment: pt's spouse was heavy smoker, smoked in home.    Clinical Intake:  Pre-visit preparation completed: No  Pain : No/denies pain     Nutritional Status: BMI 25 -29 Overweight Nutritional Risks: None Diabetes: No  How often do you need to have someone help you when you read instructions, pamphlets, or other written materials from your doctor or pharmacy?: 3 - Sometimes  Interpreter Needed?: No  Information entered by :: Tyson Dense, RN  Past Medical History:  Diagnosis Date  . Arthritis   . Cancer [facility])    breast, left; s/p lumpectomy/chemo/radiation  . CAP (community acquired pneumonia) 05/02/2015  . Cardiac pacemaker in situ 02/19/2015  . Cerebrovascular accident (CVA) due to thrombosis of cerebral artery ([place]) 03/23/2015  . CKD (chronic kidney disease) 02/19/2015  . Degenerative cervical spinal stenosis 03/01/2016  . Dementia without behavioral disturbance 08/14/2015  . Depression   . Diabetes mellitus without complication ([place])   . Gout 02/19/2015  . H/O colon cancer, stage I 07/03/2016   s/p resection 2013 - colonic mucosa with high-grade dysplasia/adenocarcinoma in situ  . Hyperlipidemia   . Hypertension   . Hypertensive heart disease with CHF (congestive heart failure) ([place]) 02/24/2015  . Hypokalemia 03/01/2016  . Lung mass 04/21/2016  . Mild  cognitive impairment 02/24/2015  . Mild mitral regurgitation 03/11/2015  . Moderate tricuspid regurgitation 03/11/2015   02/2015   . Osteoarthritis 03/27/2015  . PAD (peripheral artery disease) ([place]) 02/19/2015   S/p stent in left leg   . Paroxysmal atrial fibrillation (HCC)   . Presence of permanent cardiac pacemaker   . Pulmonary arterial hypertension ([place]), mild 03/11/2015  . Renal disorder   . Spinal cord compression due to degenerative disorder of spinal column ([place]) 03/01/2016  . Transaminasemia   . Type 2 diabetes mellitus without complication, with long-term current use of  insulin ([place]) 02/24/2015  . Vitamin B12 deficiency 07/30/2016  . 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 BREAST LUMPECTOMY Left   . CARDIAC PACEMAKER PLACEMENT    . CATARACT EXTRACTION    . EYE SURGERY    . kidney stone removal     x3  . LAPAROSCOPIC NEPHRECTOMY Left 07/29/2015   Procedure: LEFT LAPAROSCOPIC SIMPLE NEPHRECTOMY;  Surgeon: Ardis Hughs, MD;  Location: [facility];  Service: Urology;  Laterality: Left;  . LUMBAR SPINE SURGERY     laminectomy and rod   Family History  Problem Relation Age of Onset  . Cancer Mother        pancreatic  . Diabetes Sister   . Hypertension Sister   . Asthma Sister   . Hypertension Brother    Social History   Socioeconomic History  . Marital status: Widowed    Spouse name: None  . Number of children: 0  . Years of education: None  . Highest education level: None  Social Needs  . Financial resource strain: None  . Food insecurity - worry: None  . Food insecurity - inability: None  . Transportation needs - medical: None  . Transportation needs - non-medical: None  Occupational History  . Occupation: retired [employer]  Tobacco Use  . Smoking status: Passive Smoke Exposure - Never Smoker  . Smokeless tobacco: Never Used  . Tobacco comment: pt's spouse was heavy smoker, smoked in home.   Substance and Sexual Activity  . Alcohol use: No    Alcohol/week: 0.0 oz  . Drug use: No  . Sexual activity: No  Other Topics Concern  . None  Social History Narrative   Admitted to [facility] 02/23/16   Widowed   Never smoked   Alcohol none   DNR    Outpatient Encounter Medications as of 02/23/2017  Medication Sig  . acetaminophen (TYLENOL) 325 MG tablet Take 650 mg by mouth every 6 (six) hours as needed.  Marland Kitchen allopurinol (ZYLOPRIM) 300 MG tablet TAKE ONE TABLET BY MOUTH ONCE DAILY  . amiodarone (PACERONE) 200 MG tablet Take 1 tablet (200 mg total) by mouth daily.  Marland Kitchen  amLODipine (NORVASC) 5 MG tablet Take 1 tablet (5 mg total) by mouth daily.  Marland Kitchen apixaban (ELIQUIS) 2.5 MG TABS tablet Take 2.5 mg by mouth 2 (two) times daily.  Marland Kitchen atorvastatin (LIPITOR) 80 MG tablet Take 1 tablet (80 mg total) by mouth daily with supper.  . benazepril (LOTENSIN) 20 MG tablet Take 20 mg by mouth daily.   . carvedilol (COREG) 3.125 MG tablet Take 1 tablet (3.125 mg total) by mouth 2 (two) times daily with a meal.  . donepezil (ARICEPT) 10 MG tablet Take 1 tablet (10 mg total) by mouth at bedtime.  . furosemide (LASIX) 40 MG tablet Take 40 mg by mouth. Take one tablet daily  . HUMALOG [facility]  100 UNIT/ML KiwkPen Inject 5 Units into the skin 3 (three) times daily before meals. When CBG is greater than 200; inject 8 units with a CBG greater than 300  . Insulin Glargine (TOUJEO SOLOSTAR) 300 UNIT/ML SOPN Inject 4 Units into the skin at bedtime.  . meclizine (ANTIVERT) 12.5 MG tablet Take 12.5 mg by mouth 3 (three) times daily as needed for dizziness or nausea.   . Multiple Vitamins-Minerals (WOMENS MULTI VITAMIN & MINERAL PO) Take 1 tablet by mouth daily with lunch.   Marland Kitchen Specialty Vitamins Products (PROSTATE PO) Take by mouth. Take 30 ml daily to promote wound healing   No facility-administered encounter medications on file as of 02/23/2017.     Activities of Daily Living In your present state of health, do you have any difficulty performing the following activities: 02/23/2017  Hearing? N  Vision? N  Difficulty concentrating or making decisions? Y  Walking or climbing stairs? Y  Dressing or bathing? Y  Doing errands, shopping? Y  Preparing Food and eating ? Y  Using the Toilet? Y  In the past six months, have you accidently leaked urine? Y  Do you have problems with loss of bowel control? Y  Managing your Medications? Y  Managing your Finances? Y  Housekeeping or managing your Housekeeping? Y  Some recent data might be hidden    Patient Care Team: Hennie Duos, MD as PCP  - General (Internal Medicine) Jola Schmidt, MD as Consulting Physician (Ophthalmology) Cameron Sprang, MD as Consulting Physician (Neurology) Evans Lance, MD as Consulting Physician (Cardiology) Juanito Doom, MD as Consulting Physician (Pulmonary Disease)    Assessment:   This is a routine wellness examination for [place].  Exercise Activities and Dietary recommendations Current Exercise Habits: The patient does not participate in regular exercise at present, Exercise limited by: orthopedic condition(s)  Goals    None      Fall Risk Fall Risk  02/23/2017 04/12/2016 12/30/2015 10/19/2015 06/07/2015  Falls in the past year? Yes Yes Yes Yes No  Number falls in past yr: 2 or more 2 or more 2 or more 1 -  Injury with Fall? No No No No -  Risk Factor Category  - - High Fall Risk - -  Risk for fall due to : - - Impaired balance/gait - -  Follow up - - Falls prevention discussed - -   Is the patient's home free of loose throw rugs in walkways, pet beds, electrical cords, etc?   yes      Grab bars in the bathroom? yes      Handrails on the stairs?   yes      Adequate lighting?   yes  Timed Get Up and Go performed: 25 seconds, fall risk  Depression Screen PHQ 2/9 Scores 02/23/2017 12/30/2015  PHQ - 2 Score 0 0     Cognitive Function MMSE - Mini Mental State Exam 04/12/2016 12/30/2015 10/19/2015 02/24/2015  Orientation to time 2 1 3 4   Orientation to Place 4 3 4 4   Registration 3 3 3 3   Attention/ Calculation 5 3 4 4   Recall 0 0 0 0  Language- name 2 objects 2 2 2 2   Language- repeat 1 1 1 1   Language- follow 3 step command 3 3 3 3   Language- read & follow direction 1 1 1 1   Write a sentence 1 0 1 1  Copy design 1 1 1 1   Total score 23 18 23  24  6CIT Screen 02/23/2017  What Year? 0 points  What month? 0 points  What time? 0 points  Count back from 20 0 points  Months in reverse 4 points  Repeat phrase 10 points  Total Score 14    Immunization History    Administered Date(s) Administered  . Influenza, High Dose Seasonal PF 12/30/2015  . Influenza-Unspecified 12/01/2016  . PPD Test 04/26/2015, 08/02/2015, 02/23/2016    Qualifies for Shingles Vaccine? Yes, no past records of shingles vaccine  Screening Tests Health Maintenance  Topic Date Due  . FOOT EXAM  06/16/1941  . OPHTHALMOLOGY EXAM  06[birth year]16/1941  . TETANUS/TDAP  06/15/1952  . DEXA SCAN  06/16/1998  . PNA vac Low Risk Adult (1 of 2 - PCV13) 06/16/1998  . HEMOGLOBIN A1C  03/14/2017  . INFLUENZA VACCINE  Completed    Cancer Screenings: Lung: Low Dose CT Chest recommended if Age 59-80 years, 30 pack-year currently smoking OR have quit w/in 15years. Patient does not qualify. Breast:  Up to date on Mammogram? Yes   Up to date of Bone Density/Dexa? Yes Colorectal: up to date  Additional Screenings:  Hepatitis B/HIV/Syphillis: Not indicated Hepatitis C Screening: Not indicated     Plan:    I have personally reviewed and addressed the Medicare Annual Wellness questionnaire and have noted the following in the patient's chart:  A. Medical and social history B. Use of alcohol, tobacco or illicit drugs  C. Current medications and supplements D. Functional ability and status E.  Nutritional status F.  Physical activity G. Advance directives H. List of other physicians I.  Hospitalizations, surgeries, and ER visits in previous 12 months J.  [place] to include hearing, vision, cognitive, depression L. Referrals and appointments - none  In addition, I have reviewed and discussed with patient certain preventive protocols, quality metrics, and best practice recommendations. A written personalized care plan for preventive services as well as general preventive health recommendations were provided to patient.  See attached scanned questionnaire for additional information.   Signed,   Tyson Dense, RN Nurse Health Advisor   Quick Notes   Health Maintenance: Eye  exam, foot exam, tdap, dexa, and prevnar due and ordered     Abnormal Screen: 6 CIT-14     Patient Concerns: None     Nurse Concerns: None

## 2017-02-25 ENCOUNTER — Encounter: Payer: Self-pay | Admitting: Internal Medicine

## 2017-02-26 DIAGNOSIS — I70244 Atherosclerosis of native arteries of left leg with ulceration of heel and midfoot: Secondary | ICD-10-CM | POA: Diagnosis not present

## 2017-02-26 DIAGNOSIS — I70234 Atherosclerosis of native arteries of right leg with ulceration of heel and midfoot: Secondary | ICD-10-CM | POA: Diagnosis not present

## 2017-02-28 ENCOUNTER — Encounter: Payer: Self-pay | Admitting: Internal Medicine

## 2017-02-28 ENCOUNTER — Non-Acute Institutional Stay (SKILLED_NURSING_FACILITY): Payer: Medicare Other | Admitting: Internal Medicine

## 2017-02-28 DIAGNOSIS — Z794 Long term (current) use of insulin: Secondary | ICD-10-CM | POA: Diagnosis not present

## 2017-02-28 DIAGNOSIS — E1165 Type 2 diabetes mellitus with hyperglycemia: Secondary | ICD-10-CM | POA: Diagnosis not present

## 2017-02-28 NOTE — Progress Notes (Signed)
Location:  [facility] Room Number: 306-D Place of Service:  SNF (31)  Hennie Duos, MD  Patient Care Team: Hennie Duos, MD as PCP - General (Internal Medicine) Jola Schmidt, MD as Consulting Physician (Ophthalmology) Cameron Sprang, MD as Consulting Physician (Neurology) Evans Lance, MD as Consulting Physician (Cardiology) Juanito Doom, MD as Consulting Physician (Pulmonary Disease)  Extended Emergency Contact Information Primary Emergency Contact: [name],[name] Address: [address]          [place], [place] [address] [name] Phone: [phone] Mobile Phone: [phone] Relation: Sister    Allergies: Patient has no known allergies.  Chief Complaint  Patient presents with  . Acute Visit    Elevated blood sugar    HPI: Patient is 84 y.o. female who nursing asked me to see because her blood sugars have been consistently elevated. Patient cannot tell her sugars are elevated and has no complaints. Patient is not noted to be noncompliant.  Past Medical History:  Diagnosis Date  . Arthritis   . Cancer [facility])    breast, left; s/p lumpectomy/chemo/radiation  . CAP (community acquired pneumonia) 05/02/2015  . Cardiac pacemaker in situ 02/19/2015  . Cerebrovascular accident (CVA) due to thrombosis of cerebral artery ([place]) 03/23/2015  . CKD (chronic kidney disease) 02/19/2015  . Degenerative cervical spinal stenosis 03/01/2016  . Dementia without behavioral disturbance 08/14/2015  . Depression   . Diabetes mellitus without complication ([place])   . Gout 02/19/2015  . H/O colon cancer, stage I 07/03/2016   s/p resection 2013 - colonic mucosa with high-grade dysplasia/adenocarcinoma in situ  . Hyperlipidemia   . Hypertension   . Hypertensive heart disease with CHF (congestive heart failure) ([place]) 02/24/2015  . Hypokalemia 03/01/2016  . Lung mass 04/21/2016  . Mild cognitive impairment 02/24/2015  . Mild mitral  regurgitation 03/11/2015  . Moderate tricuspid regurgitation 03/11/2015   02/2015   . Osteoarthritis 03/27/2015  . PAD (peripheral artery disease) ([place]) 02/19/2015   S/p stent in left leg   . Paroxysmal atrial fibrillation (HCC)   . Presence of permanent cardiac pacemaker   . Pulmonary arterial hypertension ([place]), mild 03/11/2015  . Renal disorder   . Spinal cord compression due to degenerative disorder of spinal column ([place]) 03/01/2016  . Transaminasemia   . Type 2 diabetes mellitus without complication, with long-term current use of insulin ([place]) 02/24/2015  . Vitamin B12 deficiency 07/30/2016  . 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 BREAST LUMPECTOMY Left   . CARDIAC PACEMAKER PLACEMENT    . CATARACT EXTRACTION    . EYE SURGERY    . kidney stone removal     x3  . LAPAROSCOPIC NEPHRECTOMY Left 07/29/2015   Procedure: LEFT LAPAROSCOPIC SIMPLE NEPHRECTOMY;  Surgeon: Ardis Hughs, MD;  Location: [facility];  Service: Urology;  Laterality: Left;  . LUMBAR SPINE SURGERY     laminectomy and rod    Allergies as of 02/28/2017   No Known Allergies     Medication List        Accurate as of 02/28/17 12:51 PM. Always use your most recent med list.          acetaminophen 325 MG tablet Commonly known as:  TYLENOL Take 650 mg by mouth every 6 (six) hours as needed.   allopurinol 300 MG tablet Commonly known as:  ZYLOPRIM TAKE ONE TABLET BY MOUTH ONCE DAILY   amiodarone 200  MG tablet Commonly known as:  PACERONE Take 1 tablet (200 mg total) by mouth daily.   amLODipine 5 MG tablet Commonly known as:  NORVASC Take 1 tablet (5 mg total) by mouth daily.   apixaban 2.5 MG Tabs tablet Commonly known as:  ELIQUIS Take 2.5 mg by mouth 2 (two) times daily.   atorvastatin 80 MG tablet Commonly known as:  LIPITOR Take 1 tablet (80 mg total) by mouth daily with supper.   benazepril 40 MG tablet Commonly known as:   LOTENSIN Take 40 mg by mouth daily.   carvedilol 3.125 MG tablet Commonly known as:  COREG Take 1 tablet (3.125 mg total) by mouth 2 (two) times daily with a meal.   donepezil 10 MG tablet Commonly known as:  ARICEPT Take 1 tablet (10 mg total) by mouth at bedtime.   furosemide 40 MG tablet Commonly known as:  LASIX Take 40 mg by mouth daily.   HUMALOG KWIKPEN 100 UNIT/ML KiwkPen Generic drug:  insulin lispro Inject 5 Units into the skin 3 (three) times daily before meals. When CBG is greater than 200; inject 8 units with a CBG greater than 300   meclizine 12.5 MG tablet Commonly known as:  ANTIVERT Take 12.5 mg by mouth 3 (three) times daily as needed for dizziness or nausea.   PROSTATE PO Take by mouth. Take 30 ml daily to promote wound healing   TOUJEO SOLOSTAR 300 UNIT/ML Sopn Generic drug:  Insulin Glargine Inject 8 Units into the skin at bedtime.   WOMENS MULTI VITAMIN & MINERAL PO Take 1 tablet by mouth daily with lunch.       No orders of the defined types were placed in this encounter.   Immunization History  Administered Date(s) Administered  . Influenza, High Dose Seasonal PF 12/30/2015  . Influenza-Unspecified 12/01/2016  . PPD Test 04/26/2015, 08/02/2015, 02/23/2016    Social History   Tobacco Use  . Smoking status: Passive Smoke Exposure - Never Smoker  . Smokeless tobacco: Never Used  . Tobacco comment: pt's spouse was heavy smoker, smoked in home.   Substance Use Topics  . Alcohol use: No    Alcohol/week: 0.0 oz    Review of Systems  DATA OBTAINED: from patient GENERAL:  no fevers, fatigue, appetite changes SKIN: No itching, rash HEENT: No complaint RESPIRATORY: No cough, wheezing, SOB CARDIAC: No chest pain, palpitations, lower extremity edema  GI: No abdominal pain, No N/V/D or constipation, No heartburn or reflux  GU: No dysuria, frequency or urgency, or incontinence  MUSCULOSKELETAL: No unrelieved bone/joint pain NEUROLOGIC: No  headache, dizziness  PSYCHIATRIC: No overt anxiety or sadness  Vitals:   02/28/17 1245  BP: 139/67  Pulse: 60  Resp: 20  Temp: 97.7 F (36.5 C)  SpO2: 96%   Body mass index is 26.8 kg/m. Physical Exam  GENERAL APPEARANCE: Alert, conversant, No acute distress  SKIN: No diaphoresis rash HEENT: Unremarkable RESPIRATORY: Breathing is even, unlabored. Lung sounds are clear   CARDIOVASCULAR: Heart RRR no murmurs, rubs or gallops. No peripheral edema  GASTROINTESTINAL: Abdomen is soft, non-tender, not distended w/ normal bowel sounds.  GENITOURINARY: Bladder non tender, not distended  MUSCULOSKELETAL: No abnormal joints or musculature NEUROLOGIC: Cranial nerves 2-12 grossly intact. Moves all extremities PSYCHIATRIC: Mood and affect appropriate to situation, no behavioral issues  Patient Active Problem List   Diagnosis Date Noted  . Sinus node dysfunction ([place]) 02/18/2017  . Chronic systolic CHF (congestive heart failure) ([place]) 10/31/2016  . CAD (coronary artery  disease) 10/31/2016  . Vitamin B12 deficiency 07/30/2016  . H/O colon cancer, stage I 07/03/2016  . Lung mass 04/21/2016  . Degenerative cervical spinal stenosis 03/01/2016  . Spinal cord compression due to degenerative disorder of spinal column ([place]) 03/01/2016  . Hypokalemia 03/01/2016  . Myelopathy ([place])   . Generalized weakness 02/19/2016  . Diaphragmatic injury as surgical complication 99/37/1696  . Postoperative anemia due to acute blood loss 08/14/2015  . Dementia without behavioral disturbance 08/14/2015  . Chronic UTI 07/29/2015  . 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 Klebsiella sepsis ([place]) 05/02/2015  . CAP (community acquired pneumonia) 05/02/2015  . Transaminasemia   . Sepsis ([place]) 04/22/2015  . Paroxysmal atrial fibrillation ([place]) 04/22/2015  . Delirium   . Urinary tract infectious disease   . Osteoarthritis 03/27/2015  . Cerebrovascular accident (CVA) due to thrombosis of cerebral artery  ([place]) 03/23/2015  . Moderate tricuspid regurgitation 03/11/2015  . Dilated cardiomyopathy ([place]) 03/11/2015  . Mitral regurgitation 03/11/2015  . Pulmonary arterial hypertension ([place]), mild 03/11/2015  . Mild cognitive impairment 02/24/2015  . Type 2 diabetes mellitus without complication, with long-term current use of insulin ([place]) 02/24/2015  . Hypertensive heart disease with CHF (congestive heart failure) ([place]) 02/24/2015  . Diabetes ([place]) 02/21/2015  . Essential hypertension, benign 02/21/2015  . Hyperlipidemia 02/19/2015  . Gout 02/19/2015  . PAD (peripheral artery disease) ([place]) 02/19/2015  . Cardiac pacemaker in situ 02/19/2015  . CKD (chronic kidney disease) 02/19/2015    CMP     Component Value Date/Time   NA 142 11/14/2016 1255   K 3.9 11/14/2016 1255   CL 98 11/14/2016 1255   CO2 24 11/14/2016 1255   GLUCOSE 199 (H) 11/14/2016 1255   GLUCOSE 139 (H) 02/23/2016 0605   BUN 25 11/14/2016 1255   CREATININE 1.16 (H) 11/14/2016 1255   CALCIUM 9.3 11/14/2016 1255   PROT 7.4 10/31/2016 1529   ALBUMIN 3.6 10/31/2016 1529   AST 29 10/31/2016 1529   ALT 33 (H) 10/31/2016 1529   ALKPHOS 179 (H) 10/31/2016 1529   BILITOT 0.4 10/31/2016 1529   GFRNONAA 44 (L) 11/14/2016 1255   GFRAA 50 (L) 11/14/2016 1255   Recent Labs    09/11/16 10/31/16 1529 11/14/16 1255  NA 141 142 142  K 4.3 3.7 3.9  CL  --  96 98  CO2  --  27 24  GLUCOSE  --  324* 199*  BUN 25* 21 25  CREATININE 1.2* 1.49* 1.16*  CALCIUM  --  9.1 9.3   Recent Labs    09/11/16 10/31/16 1529  AST 20 29  ALT 17 33*  ALKPHOS 136* 179*  BILITOT  --  0.4  PROT  --  7.4  ALBUMIN  --  3.6   Recent Labs    04/03/16 09/11/16 10/31/16 1529  WBC 8.8 8.7 8.6  HGB 12.8 11.9* 12.4  HCT 41 37 39.3  MCV  --   --  92  PLT 228 193 282   Recent Labs    04/03/16 09/11/16  CHOL 234* 203*  LDLCALC 135 121  TRIG 96 59   No results found for: [facility] Lab Results  Component Value Date   TSH 1.540 10/31/2016    Lab Results  Component Value Date   HGBA1C 8.6 09/11/2016   Lab Results  Component Value Date   CHOL 203 (A) 09/11/2016   HDL 70 09/11/2016   LDLCALC 121 09/11/2016   TRIG 59 09/11/2016   CHOLHDL 3.0 02/20/2016    Significant  Diagnostic Results in last 30 days:  No results found.  Assessment and Plan  Hyperglycemia with diabetes mellitus type 2-allergic to 2+ weeks of patient's blood sugars; morning sugars range from 199 to the mid 400s, and blood sugars yesterday were equally without a pattern. We'll increase patient's Toujeo from 8 units to 10 units daily at bedtime; will continue 5 units blood sugar greater than 200, 8 units for blood sugar greater than 300 and will add 10 units for blood sugar greater than 400 to her sliding scale insulin; we'll monitor for effect and improvement necessary    Inocencio Homes MD

## 2017-03-02 ENCOUNTER — Encounter: Payer: Self-pay | Admitting: Internal Medicine

## 2017-03-05 ENCOUNTER — Non-Acute Institutional Stay (SKILLED_NURSING_FACILITY): Payer: Medicare Other | Admitting: Internal Medicine

## 2017-03-05 DIAGNOSIS — I10 Essential (primary) hypertension: Secondary | ICD-10-CM | POA: Diagnosis not present

## 2017-03-05 DIAGNOSIS — I70234 Atherosclerosis of native arteries of right leg with ulceration of heel and midfoot: Secondary | ICD-10-CM | POA: Diagnosis not present

## 2017-03-05 DIAGNOSIS — I70244 Atherosclerosis of native arteries of left leg with ulceration of heel and midfoot: Secondary | ICD-10-CM | POA: Diagnosis not present

## 2017-03-06 ENCOUNTER — Encounter: Payer: Self-pay | Admitting: Internal Medicine

## 2017-03-06 NOTE — Progress Notes (Signed)
Location:  [facility] Room Number: 710G Place of Service:  SNF (31)  Hennie Duos, MD  Patient Care Team: Hennie Duos, MD as PCP - General (Internal Medicine) Jola Schmidt, MD as Consulting Physician (Ophthalmology) Cameron Sprang, MD as Consulting Physician (Neurology) Evans Lance, MD as Consulting Physician (Cardiology) Juanito Doom, MD as Consulting Physician (Pulmonary Disease)  Extended Emergency Contact Information Primary Emergency Contact: [name],[name] Address: [address]          [place], [place] [address] [name] of [place] Phone: [phone] Mobile Phone: [phone] Relation: Sister    Allergies: Patient has no known allergies.  Chief Complaint  Patient presents with  . Acute Visit    hypertension    HPI: Patient is 84 y.o. female who nursing asked me to see for increased elevated blood pressure pressure for the last 10 days or so before evaluated; patient has no complaint of headache chest pain and shortness of breath dizziness, nausea.  Past Medical History:  Diagnosis Date  . Arthritis   . Cancer [facility])    breast, left; s/p lumpectomy/chemo/radiation  . CAP (community acquired pneumonia) 05/02/2015  . Cardiac pacemaker in situ 02/19/2015  . Cerebrovascular accident (CVA) due to thrombosis of cerebral artery ([place]) 03/23/2015  . CKD (chronic kidney disease) 02/19/2015  . Degenerative cervical spinal stenosis 03/01/2016  . Dementia without behavioral disturbance 08/14/2015  . Depression   . Diabetes mellitus without complication ([place])   . Gout 02/19/2015  . H/O colon cancer, stage I 07/03/2016   s/p resection 2013 - colonic mucosa with high-grade dysplasia/adenocarcinoma in situ  . Hyperlipidemia   . Hypertension   . Hypertensive heart disease with CHF (congestive heart failure) ([place]) 02/24/2015  . Hypokalemia 03/01/2016  . Lung mass 04/21/2016  . Mild cognitive impairment 02/24/2015  . Mild  mitral regurgitation 03/11/2015  . Moderate tricuspid regurgitation 03/11/2015   02/2015   . Osteoarthritis 03/27/2015  . PAD (peripheral artery disease) ([place]) 02/19/2015   S/p stent in left leg   . Paroxysmal atrial fibrillation (HCC)   . Presence of permanent cardiac pacemaker   . Pulmonary arterial hypertension ([place]), mild 03/11/2015  . Renal disorder   . Spinal cord compression due to degenerative disorder of spinal column ([place]) 03/01/2016  . Transaminasemia   . Type 2 diabetes mellitus without complication, with long-term current use of insulin ([place]) 02/24/2015  . Vitamin B12 deficiency 07/30/2016  . 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 BREAST LUMPECTOMY Left   . CARDIAC PACEMAKER PLACEMENT    . CATARACT EXTRACTION    . EYE SURGERY    . kidney stone removal     x3  . LAPAROSCOPIC NEPHRECTOMY Left 07/29/2015   Procedure: LEFT LAPAROSCOPIC SIMPLE NEPHRECTOMY;  Surgeon: Ardis Hughs, MD;  Location: [facility];  Service: Urology;  Laterality: Left;  . LUMBAR SPINE SURGERY     laminectomy and rod    Allergies as of 03/05/2017   No Known Allergies     Medication List        Accurate as of 03/05/17 11:59 PM. Always use your most recent med list.          acetaminophen 325 MG tablet Commonly known as:  TYLENOL Take 650 mg by mouth every 6 (six) hours as needed.   allopurinol 300 MG tablet Commonly known as:  ZYLOPRIM TAKE ONE TABLET BY MOUTH ONCE DAILY   amiodarone  200 MG tablet Commonly known as:  PACERONE Take 1 tablet (200 mg total) by mouth daily.   amLODipine 10 MG tablet Commonly known as:  NORVASC Take 10 mg by mouth at bedtime.   apixaban 2.5 MG Tabs tablet Commonly known as:  ELIQUIS Take 2.5 mg by mouth 2 (two) times daily.   atorvastatin 80 MG tablet Commonly known as:  LIPITOR Take 1 tablet (80 mg total) by mouth daily with supper.   carvedilol 3.125 MG tablet Commonly known as:   COREG Take 1 tablet (3.125 mg total) by mouth 2 (two) times daily with a meal.   donepezil 10 MG tablet Commonly known as:  ARICEPT Take 1 tablet (10 mg total) by mouth at bedtime.   furosemide 40 MG tablet Commonly known as:  LASIX Take 40 mg by mouth daily.   HUMALOG KWIKPEN 100 UNIT/ML KiwkPen Generic drug:  insulin lispro Inject 5 Units into the skin 3 (three) times daily before meals. When CBG is greater than 200; inject 8 units with a CBG greater than 300   meclizine 12.5 MG tablet Commonly known as:  ANTIVERT Take 12.5 mg by mouth 3 (three) times daily as needed for dizziness or nausea.   TOUJEO SOLOSTAR 300 UNIT/ML Sopn Generic drug:  Insulin Glargine Inject 10 Units into the skin at bedtime.   WOMENS MULTI VITAMIN & MINERAL PO Take 1 tablet by mouth daily with lunch.       No orders of the defined types were placed in this encounter.   Immunization History  Administered Date(s) Administered  . Influenza, High Dose Seasonal PF 12/30/2015  . Influenza-Unspecified 12/01/2016  . PPD Test 04/26/2015, 08/02/2015, 02/23/2016    Social History   Tobacco Use  . Smoking status: Passive Smoke Exposure - Never Smoker  . Smokeless tobacco: Never Used  . Tobacco comment: pt's spouse was heavy smoker, smoked in home.   Substance Use Topics  . Alcohol use: No    Alcohol/week: 0.0 oz    Review of Systems  DATA OBTAINED: from patient, nurse GENERAL:  no fevers, fatigue, appetite changes SKIN: No itching, rash HEENT: No complaint RESPIRATORY: No cough, wheezing, SOB CARDIAC: No chest pain, palpitations, lower extremity edema  GI: No abdominal pain, No N/V/D or constipation, No heartburn or reflux  GU: No dysuria, frequency or urgency, or incontinence  MUSCULOSKELETAL: No unrelieved bone/joint pain NEUROLOGIC: No headache, dizziness  PSYCHIATRIC: No overt anxiety or sadness  Vitals:   03/05/17 1153  BP: (!) 147/78  Pulse: 60  Resp: 18  Temp: (!) 97 F (36.1  C)  SpO2: 97%   Body mass index is 26.37 kg/m. Physical Exam  GENERAL APPEARANCE: Alert, conversant, No acute distress  SKIN: No diaphoresis rash HEENT: Unremarkable RESPIRATORY: Breathing is even, unlabored. Lung sounds are clear   CARDIOVASCULAR: Heart RRR no murmurs, rubs or gallops. No peripheral edema  GASTROINTESTINAL: Abdomen is soft, non-tender, not distended w/ normal bowel sounds.  GENITOURINARY: Bladder non tender, not distended  MUSCULOSKELETAL: No abnormal joints or musculature NEUROLOGIC: Cranial nerves 2-12 grossly intact. Moves all extremities PSYCHIATRIC: Mood and affect appropriate to situation, no behavioral issues  Patient Active Problem List   Diagnosis Date Noted  . Sinus node dysfunction ([place]) 02/18/2017  . Chronic systolic CHF (congestive heart failure) ([place]) 10/31/2016  . CAD (coronary artery disease) 10/31/2016  . Vitamin B12 deficiency 07/30/2016  . H/O colon cancer, stage I 07/03/2016  . Lung mass 04/21/2016  . Degenerative cervical spinal stenosis 03/01/2016  .  Spinal cord compression due to degenerative disorder of spinal column ([place]) 03/01/2016  . Hypokalemia 03/01/2016  . Myelopathy ([place])   . Generalized weakness 02/19/2016  . Diaphragmatic injury as surgical complication 76/28/3151  . Postoperative anemia due to acute blood loss 08/14/2015  . Dementia without behavioral disturbance 08/14/2015  . Chronic UTI 07/29/2015  . 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 Klebsiella sepsis ([place]) 05/02/2015  . CAP (community acquired pneumonia) 05/02/2015  . Transaminasemia   . Sepsis ([place]) 04/22/2015  . Paroxysmal atrial fibrillation ([place]) 04/22/2015  . Delirium   . Urinary tract infectious disease   . Osteoarthritis 03/27/2015  . Cerebrovascular accident (CVA) due to thrombosis of cerebral artery ([place]) 03/23/2015  . Moderate tricuspid regurgitation 03/11/2015  . Dilated cardiomyopathy ([place]) 03/11/2015  . Mitral regurgitation 03/11/2015   . Pulmonary arterial hypertension ([place]), mild 03/11/2015  . Mild cognitive impairment 02/24/2015  . Type 2 diabetes mellitus without complication, with long-term current use of insulin ([place]) 02/24/2015  . Hypertensive heart disease with CHF (congestive heart failure) ([place]) 02/24/2015  . Diabetes ([place]) 02/21/2015  . Essential hypertension, benign 02/21/2015  . Hyperlipidemia 02/19/2015  . Gout 02/19/2015  . PAD (peripheral artery disease) ([place]) 02/19/2015  . Cardiac pacemaker in situ 02/19/2015  . CKD (chronic kidney disease) 02/19/2015    CMP     Component Value Date/Time   NA 142 11/14/2016 1255   K 3.9 11/14/2016 1255   CL 98 11/14/2016 1255   CO2 24 11/14/2016 1255   GLUCOSE 199 (H) 11/14/2016 1255   GLUCOSE 139 (H) 02/23/2016 0605   BUN 25 11/14/2016 1255   CREATININE 1.16 (H) 11/14/2016 1255   CALCIUM 9.3 11/14/2016 1255   PROT 7.4 10/31/2016 1529   ALBUMIN 3.6 10/31/2016 1529   AST 29 10/31/2016 1529   ALT 33 (H) 10/31/2016 1529   ALKPHOS 179 (H) 10/31/2016 1529   BILITOT 0.4 10/31/2016 1529   GFRNONAA 44 (L) 11/14/2016 1255   GFRAA 50 (L) 11/14/2016 1255   Recent Labs    09/11/16 10/31/16 1529 11/14/16 1255  NA 141 142 142  K 4.3 3.7 3.9  CL  --  96 98  CO2  --  27 24  GLUCOSE  --  324* 199*  BUN 25* 21 25  CREATININE 1.2* 1.49* 1.16*  CALCIUM  --  9.1 9.3   Recent Labs    09/11/16 10/31/16 1529  AST 20 29  ALT 17 33*  ALKPHOS 136* 179*  BILITOT  --  0.4  PROT  --  7.4  ALBUMIN  --  3.6   Recent Labs    04/03/16 09/11/16 10/31/16 1529  WBC 8.8 8.7 8.6  HGB 12.8 11.9* 12.4  HCT 41 37 39.3  MCV  --   --  92  PLT 228 193 282   Recent Labs    04/03/16 09/11/16  CHOL 234* 203*  LDLCALC 135 121  TRIG 96 59   No results found for: [facility] Lab Results  Component Value Date   TSH 1.540 10/31/2016   Lab Results  Component Value Date   HGBA1C 8.6 09/11/2016   Lab Results  Component Value Date   CHOL 203 (A) 09/11/2016   HDL 70  09/11/2016   LDLCALC 121 09/11/2016   TRIG 59 09/11/2016   CHOLHDL 3.0 02/20/2016    Significant Diagnostic Results in last 30 days:  No results found.  Assessment and Plan  Hypertension with elevated blood pressure-evaluated about 2 weeks worth of blood pressures; will add Norvasc  10 mg by mouth daily; monitor result     Allison Dunn. Sheppard Coil, MD

## 2017-03-07 ENCOUNTER — Encounter: Payer: Self-pay | Admitting: Internal Medicine

## 2017-03-07 ENCOUNTER — Non-Acute Institutional Stay (SKILLED_NURSING_FACILITY): Payer: Medicare Other | Admitting: Internal Medicine

## 2017-03-07 DIAGNOSIS — E785 Hyperlipidemia, unspecified: Secondary | ICD-10-CM | POA: Diagnosis not present

## 2017-03-07 DIAGNOSIS — I6330 Cerebral infarction due to thrombosis of unspecified cerebral artery: Secondary | ICD-10-CM | POA: Diagnosis not present

## 2017-03-07 DIAGNOSIS — M109 Gout, unspecified: Secondary | ICD-10-CM

## 2017-03-07 NOTE — Progress Notes (Signed)
Location:  [facility] Room Number: 505L Place of Service:  SNF (31)  Hennie Duos, MD  Patient Care Team: Hennie Duos, MD as PCP - General (Internal Medicine) Jola Schmidt, MD as Consulting Physician (Ophthalmology) Cameron Sprang, MD as Consulting Physician (Neurology) Evans Lance, MD as Consulting Physician (Cardiology) Juanito Doom, MD as Consulting Physician (Pulmonary Disease)  Extended Emergency Contact Information Primary Emergency Contact: [name],[name] Address: [address]          [place], [place] [address] [name] of [place] Phone: [phone] Mobile Phone: [phone] Relation: Sister    Allergies: Patient has no known allergies.  Chief Complaint  Patient presents with  . Medical Management of Chronic Issues    Routine visit-diabetes (needs foot exam, eye exam, Microalb, Pneumovax, Td, DEXA)    HPI: Patient is 84 y.o. female who is being seen for routine issues of CVA due to thrombosis, hyperlipidemia, and gout.  Past Medical History:  Diagnosis Date  . Arthritis   . Cancer [facility])    breast, left; s/p lumpectomy/chemo/radiation  . CAP (community acquired pneumonia) 05/02/2015  . Cardiac pacemaker in situ 02/19/2015  . Cerebrovascular accident (CVA) due to thrombosis of cerebral artery ([place]) 03/23/2015  . CKD (chronic kidney disease) 02/19/2015  . Degenerative cervical spinal stenosis 03/01/2016  . Dementia without behavioral disturbance 08/14/2015  . Depression   . Diabetes mellitus without complication ([place])   . Gout 02/19/2015  . H/O colon cancer, stage I 07/03/2016   s/p resection 2013 - colonic mucosa with high-grade dysplasia/adenocarcinoma in situ  . Hyperlipidemia   . Hypertension   . Hypertensive heart disease with CHF (congestive heart failure) ([place]) 02/24/2015  . Hypokalemia 03/01/2016  . Lung mass 04/21/2016  . Mild cognitive impairment 02/24/2015  . Mild mitral regurgitation  03/11/2015  . Moderate tricuspid regurgitation 03/11/2015   02/2015   . Osteoarthritis 03/27/2015  . PAD (peripheral artery disease) ([place]) 02/19/2015   S/p stent in left leg   . Paroxysmal atrial fibrillation (HCC)   . Presence of permanent cardiac pacemaker   . Pulmonary arterial hypertension ([place]), mild 03/11/2015  . Renal disorder   . Spinal cord compression due to degenerative disorder of spinal column ([place]) 03/01/2016  . Transaminasemia   . Type 2 diabetes mellitus without complication, with long-term current use of insulin ([place]) 02/24/2015  . Vitamin B12 deficiency 07/30/2016  . 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 BREAST LUMPECTOMY Left   . CARDIAC PACEMAKER PLACEMENT    . CATARACT EXTRACTION    . EYE SURGERY    . kidney stone removal     x3  . LAPAROSCOPIC NEPHRECTOMY Left 07/29/2015   Procedure: LEFT LAPAROSCOPIC SIMPLE NEPHRECTOMY;  Surgeon: Ardis Hughs, MD;  Location: [facility];  Service: Urology;  Laterality: Left;  . LUMBAR SPINE SURGERY     laminectomy and rod    Allergies as of 03/07/2017   No Known Allergies     Medication List        Accurate as of 03/07/17 11:59 PM. Always use your most recent med list.          acetaminophen 325 MG tablet Commonly known as:  TYLENOL Take 650 mg by mouth every 6 (six) hours as needed.   allopurinol 300 MG tablet Commonly known as:  ZYLOPRIM TAKE ONE TABLET BY MOUTH ONCE DAILY   amiodarone 200 MG tablet Commonly known as:  PACERONE Take 1 tablet (200 mg total) by mouth daily.   amLODipine 10 MG tablet Commonly known as:  NORVASC Take 10 mg by mouth at bedtime.   apixaban 2.5 MG Tabs tablet Commonly known as:  ELIQUIS Take 2.5 mg by mouth 2 (two) times daily.   atorvastatin 80 MG tablet Commonly known as:  LIPITOR Take 1 tablet (80 mg total) by mouth daily with supper.   carvedilol 3.125 MG tablet Commonly known as:  COREG Take 1 tablet (3.125 mg  total) by mouth 2 (two) times daily with a meal.   donepezil 10 MG tablet Commonly known as:  ARICEPT Take 1 tablet (10 mg total) by mouth at bedtime.   furosemide 40 MG tablet Commonly known as:  LASIX Take 40 mg by mouth daily.   HUMALOG KWIKPEN 100 UNIT/ML KiwkPen Generic drug:  insulin lispro Inject 5 Units into the skin 3 (three) times daily before meals. When CBG is greater than 200; inject 8 units with a CBG greater than 300   meclizine 12.5 MG tablet Commonly known as:  ANTIVERT Take 12.5 mg by mouth 3 (three) times daily as needed for dizziness or nausea.   TOUJEO SOLOSTAR 300 UNIT/ML Sopn Generic drug:  Insulin Glargine Inject 10 Units into the skin at bedtime.   WOMENS MULTI VITAMIN & MINERAL PO Take 1 tablet by mouth daily with lunch.       No orders of the defined types were placed in this encounter.   Immunization History  Administered Date(s) Administered  . Influenza, High Dose Seasonal PF 12/30/2015  . Influenza-Unspecified 12/01/2016  . PPD Test 04/26/2015, 08/02/2015, 02/23/2016    Social History   Tobacco Use  . Smoking status: Passive Smoke Exposure - Never Smoker  . Smokeless tobacco: Never Used  . Tobacco comment: pt's spouse was heavy smoker, smoked in home.   Substance Use Topics  . Alcohol use: No    Alcohol/week: 0.0 oz    Review of Systems  DATA OBTAINED: from patient, nurse GENERAL:  no fevers, fatigue, appetite changes SKIN: No itching, rash HEENT: No complaint RESPIRATORY: No cough, wheezing, SOB CARDIAC: No chest pain, palpitations, lower extremity edema  GI: No abdominal pain, No N/V/D or constipation, No heartburn or reflux  GU: No dysuria, frequency or urgency, or incontinence  MUSCULOSKELETAL: No unrelieved bone/joint pain NEUROLOGIC: No headache, dizziness  PSYCHIATRIC: No overt anxiety or sadness  Vitals:   03/07/17 1548  BP: (!) 90/50  Pulse: 60  Resp: 18  Temp: 98.3 F (36.8 C)  SpO2: 97%   Body mass index  is 26.37 kg/m. Physical Exam  GENERAL APPEARANCE: Alert, conversant, No acute distress  SKIN: No diaphoresis rash HEENT: Unremarkable RESPIRATORY: Breathing is even, unlabored. Lung sounds are clear   CARDIOVASCULAR: Heart RRR no murmurs, rubs or gallops. No peripheral edema  GASTROINTESTINAL: Abdomen is soft, non-tender, not distended w/ normal bowel sounds.  GENITOURINARY: Bladder non tender, not distended  MUSCULOSKELETAL: No abnormal joints or musculature NEUROLOGIC: Cranial nerves 2-12 grossly intact. Moves all extremities PSYCHIATRIC: Mood and affect appropriate to situation, no behavioral issues  Patient Active Problem List   Diagnosis Date Noted  . Sinus node dysfunction ([place]) 02/18/2017  . Chronic systolic CHF (congestive heart failure) ([place]) 10/31/2016  . CAD (coronary artery disease) 10/31/2016  . Vitamin B12 deficiency 07/30/2016  . H/O colon cancer, stage I 07/03/2016  . Lung mass 04/21/2016  . Degenerative cervical spinal stenosis 03/01/2016  . Spinal cord compression due to degenerative disorder of  spinal column ([place]) 03/01/2016  . Hypokalemia 03/01/2016  . Myelopathy ([place])   . Generalized weakness 02/19/2016  . Diaphragmatic injury as surgical complication 78/29/5621  . Postoperative anemia due to acute blood loss 08/14/2015  . Dementia without behavioral disturbance 08/14/2015  . Chronic UTI 07/29/2015  . 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 Klebsiella sepsis ([place]) 05/02/2015  . CAP (community acquired pneumonia) 05/02/2015  . Transaminasemia   . Sepsis ([place]) 04/22/2015  . Paroxysmal atrial fibrillation ([place]) 04/22/2015  . Delirium   . Urinary tract infectious disease   . Osteoarthritis 03/27/2015  . Cerebrovascular accident (CVA) due to thrombosis of cerebral artery ([place]) 03/23/2015  . Moderate tricuspid regurgitation 03/11/2015  . Dilated cardiomyopathy ([place]) 03/11/2015  . Mitral regurgitation 03/11/2015  . Pulmonary arterial hypertension  ([place]), mild 03/11/2015  . Mild cognitive impairment 02/24/2015  . Type 2 diabetes mellitus without complication, with long-term current use of insulin ([place]) 02/24/2015  . Hypertensive heart disease with CHF (congestive heart failure) ([place]) 02/24/2015  . Diabetes ([place]) 02/21/2015  . Essential hypertension, benign 02/21/2015  . Hyperlipidemia 02/19/2015  . Gout 02/19/2015  . PAD (peripheral artery disease) ([place]) 02/19/2015  . Cardiac pacemaker in situ 02/19/2015  . CKD (chronic kidney disease) 02/19/2015    CMP     Component Value Date/Time   NA 142 11/14/2016 1255   K 3.9 11/14/2016 1255   CL 98 11/14/2016 1255   CO2 24 11/14/2016 1255   GLUCOSE 199 (H) 11/14/2016 1255   GLUCOSE 139 (H) 02/23/2016 0605   BUN 25 11/14/2016 1255   CREATININE 1.16 (H) 11/14/2016 1255   CALCIUM 9.3 11/14/2016 1255   PROT 7.4 10/31/2016 1529   ALBUMIN 3.6 10/31/2016 1529   AST 29 10/31/2016 1529   ALT 33 (H) 10/31/2016 1529   ALKPHOS 179 (H) 10/31/2016 1529   BILITOT 0.4 10/31/2016 1529   GFRNONAA 44 (L) 11/14/2016 1255   GFRAA 50 (L) 11/14/2016 1255   Recent Labs    09/11/16 10/31/16 1529 11/14/16 1255  NA 141 142 142  K 4.3 3.7 3.9  CL  --  96 98  CO2  --  27 24  GLUCOSE  --  324* 199*  BUN 25* 21 25  CREATININE 1.2* 1.49* 1.16*  CALCIUM  --  9.1 9.3   Recent Labs    09/11/16 10/31/16 1529  AST 20 29  ALT 17 33*  ALKPHOS 136* 179*  BILITOT  --  0.4  PROT  --  7.4  ALBUMIN  --  3.6   Recent Labs    04/03/16 09/11/16 10/31/16 1529  WBC 8.8 8.7 8.6  HGB 12.8 11.9* 12.4  HCT 41 37 39.3  MCV  --   --  92  PLT 228 193 282   Recent Labs    04/03/16 09/11/16  CHOL 234* 203*  LDLCALC 135 121  TRIG 96 59   No results found for: [facility] Lab Results  Component Value Date   TSH 1.540 10/31/2016   Lab Results  Component Value Date   HGBA1C 8.6 09/11/2016   Lab Results  Component Value Date   CHOL 203 (A) 09/11/2016   HDL 70 09/11/2016   LDLCALC 121 09/11/2016    TRIG 59 09/11/2016   CHOLHDL 3.0 02/20/2016    Significant Diagnostic Results in last 30 days:  No results found.  Assessment and Plan  Cerebrovascular accident (CVA) due to thrombosis of cerebral artery (HCC) Stable; continue Eliquis 2.5 mg by mouth twice a day  Hyperlipidemia LDL  121, HDL 71-which is protective; continue Lipitor 80 mg by mouth daily  Gout No recent flares; continue allopurinol 300 mg daily    Alaney Witter D. Sheppard Coil, MD

## 2017-03-08 DIAGNOSIS — M1990 Unspecified osteoarthritis, unspecified site: Secondary | ICD-10-CM | POA: Diagnosis not present

## 2017-03-08 DIAGNOSIS — M810 Age-related osteoporosis without current pathological fracture: Secondary | ICD-10-CM | POA: Diagnosis not present

## 2017-03-08 DIAGNOSIS — Z780 Asymptomatic menopausal state: Secondary | ICD-10-CM | POA: Diagnosis not present

## 2017-03-08 DIAGNOSIS — Z13820 Encounter for screening for osteoporosis: Secondary | ICD-10-CM | POA: Diagnosis not present

## 2017-03-12 DIAGNOSIS — I70244 Atherosclerosis of native arteries of left leg with ulceration of heel and midfoot: Secondary | ICD-10-CM | POA: Diagnosis not present

## 2017-03-12 DIAGNOSIS — I70234 Atherosclerosis of native arteries of right leg with ulceration of heel and midfoot: Secondary | ICD-10-CM | POA: Diagnosis not present

## 2017-03-14 ENCOUNTER — Encounter: Payer: Self-pay | Admitting: Internal Medicine

## 2017-03-14 NOTE — Progress Notes (Signed)
Location:  [facility] of Service:  SNF (31)  Hennie Duos, MD  Patient Care Team: Hennie Duos, MD as PCP - General (Internal Medicine) Jola Schmidt, MD as Consulting Physician (Ophthalmology) Cameron Sprang, MD as Consulting Physician (Neurology) Evans Lance, MD as Consulting Physician (Cardiology) Juanito Doom, MD as Consulting Physician (Pulmonary Disease)  Extended Emergency Contact Information Primary Emergency Contact: [name],[name] Address: [address]          [place], [place] [address] [name] of [place] Phone: [phone] Mobile Phone: [phone] Relation: Sister    Allergies: Patient has no known allergies.  Chief Complaint  Patient presents with  . Acute Visit    HPI: Patient is 84 y.o. female who nursing asked me to see because her blood sugars have been running high. Patient has no complaints or symptoms with high blood sugars.  Past Medical History:  Diagnosis Date  . Arthritis   . Cancer [facility])    breast, left; s/p lumpectomy/chemo/radiation  . CAP (community acquired pneumonia) 05/02/2015  . Cardiac pacemaker in situ 02/19/2015  . Cerebrovascular accident (CVA) due to thrombosis of cerebral artery ([place]) 03/23/2015  . CKD (chronic kidney disease) 02/19/2015  . Degenerative cervical spinal stenosis 03/01/2016  . Dementia without behavioral disturbance 08/14/2015  . Depression   . Diabetes mellitus without complication ([place])   . Gout 02/19/2015  . H/O colon cancer, stage I 07/03/2016   s/p resection 2013 - colonic mucosa with high-grade dysplasia/adenocarcinoma in situ  . Hyperlipidemia   . Hypertension   . Hypertensive heart disease with CHF (congestive heart failure) ([place]) 02/24/2015  . Hypokalemia 03/01/2016  . Lung mass 04/21/2016  . Mild cognitive impairment 02/24/2015  . Mild mitral regurgitation 03/11/2015  . Moderate tricuspid regurgitation 03/11/2015   02/2015   . Osteoarthritis 03/27/2015    . PAD (peripheral artery disease) ([place]) 02/19/2015   S/p stent in left leg   . Paroxysmal atrial fibrillation (HCC)   . Presence of permanent cardiac pacemaker   . Pulmonary arterial hypertension ([place]), mild 03/11/2015  . Renal disorder   . Spinal cord compression due to degenerative disorder of spinal column ([place]) 03/01/2016  . Transaminasemia   . Type 2 diabetes mellitus without complication, with long-term current use of insulin ([place]) 02/24/2015  . Vitamin B12 deficiency 07/30/2016  . 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 BREAST LUMPECTOMY Left   . CARDIAC PACEMAKER PLACEMENT    . CATARACT EXTRACTION    . EYE SURGERY    . kidney stone removal     x3  . LAPAROSCOPIC NEPHRECTOMY Left 07/29/2015   Procedure: LEFT LAPAROSCOPIC SIMPLE NEPHRECTOMY;  Surgeon: Ardis Hughs, MD;  Location: [facility] ORS;  Service: Urology;  Laterality: Left;  . LUMBAR SPINE SURGERY     laminectomy and rod    Allergies as of 02/08/2017   No Known Allergies     Medication List        Accurate as of 02/08/17 11:59 PM. Always use your most recent med list.          acetaminophen 325 MG tablet Commonly known as:  TYLENOL Take 650 mg by mouth every 6 (six) hours as needed.   allopurinol 300 MG tablet Commonly known as:  ZYLOPRIM TAKE ONE TABLET BY MOUTH ONCE DAILY   amiodarone 200 MG tablet Commonly known as:  PACERONE Take 1 tablet (200 mg total) by mouth daily.  apixaban 2.5 MG Tabs tablet Commonly known as:  ELIQUIS Take 2.5 mg by mouth 2 (two) times daily.   atorvastatin 80 MG tablet Commonly known as:  LIPITOR Take 1 tablet (80 mg total) by mouth daily with supper.   carvedilol 3.125 MG tablet Commonly known as:  COREG Take 1 tablet (3.125 mg total) by mouth 2 (two) times daily with a meal.   donepezil 10 MG tablet Commonly known as:  ARICEPT Take 1 tablet (10 mg total) by mouth at bedtime.   furosemide 40 MG  tablet Commonly known as:  LASIX Take 40 mg by mouth daily.   HUMALOG KWIKPEN 100 UNIT/ML KiwkPen Generic drug:  insulin lispro Inject 5 Units into the skin 3 (three) times daily before meals. When CBG is greater than 200; inject 8 units with a CBG greater than 300   meclizine 12.5 MG tablet Commonly known as:  ANTIVERT Take 12.5 mg by mouth 3 (three) times daily as needed for dizziness or nausea.   WOMENS MULTI VITAMIN & MINERAL PO Take 1 tablet by mouth daily with lunch.       No orders of the defined types were placed in this encounter.   Immunization History  Administered Date(s) Administered  . Influenza, High Dose Seasonal PF 12/30/2015  . Influenza-Unspecified 12/01/2016  . PPD Test 04/26/2015, 08/02/2015, 02/23/2016    Social History   Tobacco Use  . Smoking status: Passive Smoke Exposure - Never Smoker  . Smokeless tobacco: Never Used  . Tobacco comment: pt's spouse was heavy smoker, smoked in home.   Substance Use Topics  . Alcohol use: No    Alcohol/week: 0.0 oz    Review of Systems  DATA OBTAINED: from patient, nurse GENERAL:  no fevers, fatigue, appetite changes SKIN: No itching, rash HEENT: No complaint RESPIRATORY: No cough, wheezing, SOB CARDIAC: No chest pain, palpitations, lower extremity edema  GI: No abdominal pain, No N/V/D or constipation, No heartburn or reflux  GU: No dysuria, frequency or urgency, or incontinence  MUSCULOSKELETAL: No unrelieved bone/joint pain NEUROLOGIC: No headache, dizziness  PSYCHIATRIC: No overt anxiety or sadness  Vitals:   03/14/17 1623  BP: 140/71  Pulse: 60  Resp: 16  Temp: (!) 97.3 F (36.3 C)   Body mass index is 26.95 kg/m. Physical Exam  GENERAL APPEARANCE: Alert, conversant, No acute distress  SKIN: No diaphoresis rash HEENT: Unremarkable RESPIRATORY: Breathing is even, unlabored. Lung sounds are clear   CARDIOVASCULAR: Heart RRR no murmurs, rubs or gallops. No peripheral edema   GASTROINTESTINAL: Abdomen is soft, non-tender, not distended w/ normal bowel sounds.  GENITOURINARY: Bladder non tender, not distended  MUSCULOSKELETAL: No abnormal joints or musculature NEUROLOGIC: Cranial nerves 2-12 grossly intact. Moves all extremities PSYCHIATRIC: Mood and affect appropriate to situation, no behavioral issues  Patient Active Problem List   Diagnosis Date Noted  . Sinus node dysfunction ([place]) 02/18/2017  . Chronic systolic CHF (congestive heart failure) ([place]) 10/31/2016  . CAD (coronary artery disease) 10/31/2016  . Vitamin B12 deficiency 07/30/2016  . H/O colon cancer, stage I 07/03/2016  . Lung mass 04/21/2016  . Degenerative cervical spinal stenosis 03/01/2016  . Spinal cord compression due to degenerative disorder of spinal column ([place]) 03/01/2016  . Hypokalemia 03/01/2016  . Myelopathy ([place])   . Generalized weakness 02/19/2016  . Diaphragmatic injury as surgical complication 23/53/6144  . Postoperative anemia due to acute blood loss 08/14/2015  . Dementia without behavioral disturbance 08/14/2015  . Chronic UTI 07/29/2015  . 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  Klebsiella sepsis ([place]) 05/02/2015  . CAP (community acquired pneumonia) 05/02/2015  . Transaminasemia   . Sepsis ([place]) 04/22/2015  . Paroxysmal atrial fibrillation ([place]) 04/22/2015  . Delirium   . Urinary tract infectious disease   . Osteoarthritis 03/27/2015  . Cerebrovascular accident (CVA) due to thrombosis of cerebral artery ([place]) 03/23/2015  . Moderate tricuspid regurgitation 03/11/2015  . Dilated cardiomyopathy ([place]) 03/11/2015  . Mitral regurgitation 03/11/2015  . Pulmonary arterial hypertension ([place]), mild 03/11/2015  . Mild cognitive impairment 02/24/2015  . Type 2 diabetes mellitus without complication, with long-term current use of insulin ([place]) 02/24/2015  . Hypertensive heart disease with CHF (congestive heart failure) ([place]) 02/24/2015  . Diabetes ([place]) 02/21/2015   . Essential hypertension, benign 02/21/2015  . Hyperlipidemia 02/19/2015  . Gout 02/19/2015  . PAD (peripheral artery disease) ([place]) 02/19/2015  . Cardiac pacemaker in situ 02/19/2015  . CKD (chronic kidney disease) 02/19/2015    CMP     Component Value Date/Time   NA 142 11/14/2016 1255   K 3.9 11/14/2016 1255   CL 98 11/14/2016 1255   CO2 24 11/14/2016 1255   GLUCOSE 199 (H) 11/14/2016 1255   GLUCOSE 139 (H) 02/23/2016 0605   BUN 25 11/14/2016 1255   CREATININE 1.16 (H) 11/14/2016 1255   CALCIUM 9.3 11/14/2016 1255   PROT 7.4 10/31/2016 1529   ALBUMIN 3.6 10/31/2016 1529   AST 29 10/31/2016 1529   ALT 33 (H) 10/31/2016 1529   ALKPHOS 179 (H) 10/31/2016 1529   BILITOT 0.4 10/31/2016 1529   GFRNONAA 44 (L) 11/14/2016 1255   GFRAA 50 (L) 11/14/2016 1255   Recent Labs    09/11/16 10/31/16 1529 11/14/16 1255  NA 141 142 142  K 4.3 3.7 3.9  CL  --  96 98  CO2  --  27 24  GLUCOSE  --  324* 199*  BUN 25* 21 25  CREATININE 1.2* 1.49* 1.16*  CALCIUM  --  9.1 9.3   Recent Labs    09/11/16 10/31/16 1529  AST 20 29  ALT 17 33*  ALKPHOS 136* 179*  BILITOT  --  0.4  PROT  --  7.4  ALBUMIN  --  3.6   Recent Labs    04/03/16 09/11/16 10/31/16 1529  WBC 8.8 8.7 8.6  HGB 12.8 11.9* 12.4  HCT 41 37 39.3  MCV  --   --  92  PLT 228 193 282   Recent Labs    04/03/16 09/11/16  CHOL 234* 203*  LDLCALC 135 121  TRIG 96 59   No results found for: [facility] Lab Results  Component Value Date   TSH 1.540 10/31/2016   Lab Results  Component Value Date   HGBA1C 8.6 09/11/2016   Lab Results  Component Value Date   CHOL 203 (A) 09/11/2016   HDL 70 09/11/2016   LDLCALC 121 09/11/2016   TRIG 59 09/11/2016   CHOLHDL 3.0 02/20/2016    Significant Diagnostic Results in last 30 days:  No results found.  Assessment and Plan  Diabetes mellitus type 2 with hyperglycemia-approximately 2 weeks worth of blood sugars 3 times a day were looked at; there is no  discernible pattern;  patient's a.m. blood sugars ranging from 148-404; therefore cannot raise long-acting insulin very much; will increase Toujeo to 8 units daily; will continue Humalog 5 units with each meal for blood sugars greater than 200; we'll monitor response     Inocencio Homes, MD

## 2017-03-17 ENCOUNTER — Encounter: Payer: Self-pay | Admitting: Internal Medicine

## 2017-03-18 ENCOUNTER — Encounter: Payer: Self-pay | Admitting: Internal Medicine

## 2017-03-18 NOTE — Assessment & Plan Note (Signed)
No recent flares; continue allopurinol 300 mg daily

## 2017-03-18 NOTE — Assessment & Plan Note (Signed)
Stable; continue Eliquis 2.5 mg by mouth twice a day

## 2017-03-18 NOTE — Assessment & Plan Note (Signed)
LDL 121, HDL 71-which is protective; continue Lipitor 80 mg by mouth daily

## 2017-03-19 DIAGNOSIS — I70244 Atherosclerosis of native arteries of left leg with ulceration of heel and midfoot: Secondary | ICD-10-CM | POA: Diagnosis not present

## 2017-03-19 DIAGNOSIS — I70234 Atherosclerosis of native arteries of right leg with ulceration of heel and midfoot: Secondary | ICD-10-CM | POA: Diagnosis not present

## 2017-03-26 DIAGNOSIS — I70234 Atherosclerosis of native arteries of right leg with ulceration of heel and midfoot: Secondary | ICD-10-CM | POA: Diagnosis not present

## 2017-03-26 DIAGNOSIS — I70244 Atherosclerosis of native arteries of left leg with ulceration of heel and midfoot: Secondary | ICD-10-CM | POA: Diagnosis not present

## 2017-03-30 ENCOUNTER — Non-Acute Institutional Stay (SKILLED_NURSING_FACILITY): Payer: Medicare Other | Admitting: Internal Medicine

## 2017-03-30 DIAGNOSIS — R918 Other nonspecific abnormal finding of lung field: Secondary | ICD-10-CM

## 2017-03-30 DIAGNOSIS — R531 Weakness: Secondary | ICD-10-CM | POA: Diagnosis not present

## 2017-03-31 DIAGNOSIS — E559 Vitamin D deficiency, unspecified: Secondary | ICD-10-CM | POA: Diagnosis not present

## 2017-03-31 DIAGNOSIS — E785 Hyperlipidemia, unspecified: Secondary | ICD-10-CM | POA: Diagnosis not present

## 2017-03-31 DIAGNOSIS — I129 Hypertensive chronic kidney disease with stage 1 through stage 4 chronic kidney disease, or unspecified chronic kidney disease: Secondary | ICD-10-CM | POA: Diagnosis not present

## 2017-03-31 DIAGNOSIS — E039 Hypothyroidism, unspecified: Secondary | ICD-10-CM | POA: Diagnosis not present

## 2017-03-31 DIAGNOSIS — D649 Anemia, unspecified: Secondary | ICD-10-CM | POA: Diagnosis not present

## 2017-03-31 DIAGNOSIS — I5022 Chronic systolic (congestive) heart failure: Secondary | ICD-10-CM | POA: Diagnosis not present

## 2017-04-01 ENCOUNTER — Encounter: Payer: Self-pay | Admitting: Internal Medicine

## 2017-04-01 DIAGNOSIS — E119 Type 2 diabetes mellitus without complications: Secondary | ICD-10-CM | POA: Diagnosis not present

## 2017-04-01 DIAGNOSIS — E169 Disorder of pancreatic internal secretion, unspecified: Secondary | ICD-10-CM | POA: Diagnosis not present

## 2017-04-01 DIAGNOSIS — E039 Hypothyroidism, unspecified: Secondary | ICD-10-CM | POA: Diagnosis not present

## 2017-04-01 DIAGNOSIS — R319 Hematuria, unspecified: Secondary | ICD-10-CM | POA: Diagnosis not present

## 2017-04-01 DIAGNOSIS — N390 Urinary tract infection, site not specified: Secondary | ICD-10-CM | POA: Diagnosis not present

## 2017-04-01 NOTE — Progress Notes (Signed)
Location:  [place] of Service:  SNF (31)  Hennie Duos, MD  Patient Care Team: Hennie Duos, MD as PCP - General (Internal Medicine) Jola Schmidt, MD as Consulting Physician (Ophthalmology) Cameron Sprang, MD as Consulting Physician (Neurology) Evans Lance, MD as Consulting Physician (Cardiology) Juanito Doom, MD as Consulting Physician (Pulmonary Disease)  Extended Emergency Contact Information Primary Emergency Contact: [name],[name] Address: [address]          [place], [place] [address] [name] of [place] Phone: [phone] Mobile Phone: [phone] Relation: Sister    Allergies: Patient has no known allergies.  Chief Complaint  Patient presents with  . Acute Visit    HPI: Patient is 84 y.o. female who nursing asked me to see for overall weakness and decreased appetite for the last couple weeks. Patient has had some complaints of dizziness that sound like vertigo and has been treated with Antivert. Patient has a history of a lung mass which patient and family decided not to pursue. She has not been complaining of chest pain or shortness of breath or cough.  Past Medical History:  Diagnosis Date  . Arthritis   . Cancer [facility])    breast, left; s/p lumpectomy/chemo/radiation  . CAP (community acquired pneumonia) 05/02/2015  . Cardiac pacemaker in situ 02/19/2015  . Cerebrovascular accident (CVA) due to thrombosis of cerebral artery ([place]) 03/23/2015  . CKD (chronic kidney disease) 02/19/2015  . Degenerative cervical spinal stenosis 03/01/2016  . Dementia without behavioral disturbance 08/14/2015  . Depression   . Diabetes mellitus without complication ([place])   . Gout 02/19/2015  . H/O colon cancer, stage I 07/03/2016   s/p resection 2013 - colonic mucosa with high-grade dysplasia/adenocarcinoma in situ  . Hyperlipidemia   . Hypertension   . Hypertensive heart disease with CHF (congestive heart failure)  ([place]) 02/24/2015  . Hypokalemia 03/01/2016  . Lung mass 04/21/2016  . Mild cognitive impairment 02/24/2015  . Mild mitral regurgitation 03/11/2015  . Moderate tricuspid regurgitation 03/11/2015   02/2015   . Osteoarthritis 03/27/2015  . PAD (peripheral artery disease) ([place]) 02/19/2015   S/p stent in left leg   . Paroxysmal atrial fibrillation (HCC)   . Presence of permanent cardiac pacemaker   . Pulmonary arterial hypertension ([place]), mild 03/11/2015  . Renal disorder   . Spinal cord compression due to degenerative disorder of spinal column ([place]) 03/01/2016  . Transaminasemia   . Type 2 diabetes mellitus without complication, with long-term current use of insulin ([place]) 02/24/2015  . Vitamin B12 deficiency 07/30/2016  . 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 BREAST LUMPECTOMY Left   . CARDIAC PACEMAKER PLACEMENT    . CATARACT EXTRACTION    . EYE SURGERY    . kidney stone removal     x3  . LAPAROSCOPIC NEPHRECTOMY Left 07/29/2015   Procedure: LEFT LAPAROSCOPIC SIMPLE NEPHRECTOMY;  Surgeon: Ardis Hughs, MD;  Location: [facility]S;  Service: Urology;  Laterality: Left;  . LUMBAR SPINE SURGERY     laminectomy and rod    Allergies as of 03/30/2017   No Known Allergies     Medication List        Accurate as of 03/30/17 11:59 PM. Always use your most recent med list.          acetaminophen 325 MG tablet Commonly known as:  TYLENOL Take 650 mg by mouth every 6 (six) hours as  needed.   allopurinol 300 MG tablet Commonly known as:  ZYLOPRIM TAKE ONE TABLET BY MOUTH ONCE DAILY   amiodarone 200 MG tablet Commonly known as:  PACERONE Take 1 tablet (200 mg total) by mouth daily.   amLODipine 10 MG tablet Commonly known as:  NORVASC Take 10 mg by mouth at bedtime.   apixaban 2.5 MG Tabs tablet Commonly known as:  ELIQUIS Take 2.5 mg by mouth 2 (two) times daily.   atorvastatin 80 MG tablet Commonly known as:  LIPITOR Take 1  tablet (80 mg total) by mouth daily with supper.   carvedilol 3.125 MG tablet Commonly known as:  COREG Take 1 tablet (3.125 mg total) by mouth 2 (two) times daily with a meal.   donepezil 10 MG tablet Commonly known as:  ARICEPT Take 1 tablet (10 mg total) by mouth at bedtime.   furosemide 40 MG tablet Commonly known as:  LASIX Take 40 mg by mouth daily.   HUMALOG KWIKPEN 100 UNIT/ML KiwkPen Generic drug:  insulin lispro Inject 5 Units into the skin 3 (three) times daily before meals. When CBG is greater than 200; inject 8 units with a CBG greater than 300   meclizine 12.5 MG tablet Commonly known as:  ANTIVERT Take 12.5 mg by mouth 3 (three) times daily as needed for dizziness or nausea.   TOUJEO SOLOSTAR 300 UNIT/ML Sopn Generic drug:  Insulin Glargine Inject 10 Units into the skin at bedtime.   WOMENS MULTI VITAMIN & MINERAL PO Take 1 tablet by mouth daily with lunch.       No orders of the defined types were placed in this encounter.   Immunization History  Administered Date(s) Administered  . Influenza, High Dose Seasonal PF 12/30/2015  . Influenza-Unspecified 12/01/2016  . PPD Test 04/26/2015, 08/02/2015, 02/23/2016    Social History   Tobacco Use  . Smoking status: Passive Smoke Exposure - Never Smoker  . Smokeless tobacco: Never Used  . Tobacco comment: pt's spouse was heavy smoker, smoked in home.   Substance Use Topics  . Alcohol use: No    Alcohol/week: 0.0 oz    Review of Systems  DATA OBTAINED: from patient, nurser GENERAL:  no fevers, +fatigue,+ appetite changes SKIN: No itching, rash HEENT: No complaint RESPIRATORY: No cough, wheezing, SOB CARDIAC: No chest pain, palpitations, lower extremity edema  GI: No abdominal pain, No N/V/D or constipation, No heartburn or reflux  GU: No dysuria, frequency or urgency, or incontinence  MUSCULOSKELETAL: No unrelieved bone/joint pain NEUROLOGIC: No headache,+ dizziness for a week or 2 PSYCHIATRIC:  No overt anxiety or sadness  Vitals:   04/01/17 1227  BP: (!) 143/65  Pulse: 75  Resp: 20  Temp: 98.3 F (36.8 C)   There is no height or weight on file to calculate BMI. Physical Exam  GENERAL APPEARANCE: Alert, conversant, No acute distress  SKIN: No diaphoresis rash HEENT: Unremarkable RESPIRATORY: Breathing is even, unlabored. Lung sounds are clear   CARDIOVASCULAR: Heart RRR no murmurs, rubs or gallops. No peripheral edema  GASTROINTESTINAL: Abdomen is soft, non-tender, not distended w/ normal bowel sounds.  GENITOURINARY: Bladder non tender, not distended  MUSCULOSKELETAL: No abnormal joints or musculature NEUROLOGIC: Cranial nerves 2-12 grossly intact. Moves all extremities PSYCHIATRIC: Mood and affect appropriate to situation with dementia, no behavioral issues  Patient Active Problem List   Diagnosis Date Noted  . Sinus node dysfunction ([place]) 02/18/2017  . Chronic systolic CHF (congestive heart failure) ([place]) 10/31/2016  . CAD (coronary artery disease)  10/31/2016  . Vitamin B12 deficiency 07/30/2016  . H/O colon cancer, stage I 07/03/2016  . Lung mass 04/21/2016  . Degenerative cervical spinal stenosis 03/01/2016  . Spinal cord compression due to degenerative disorder of spinal column ([place]) 03/01/2016  . Hypokalemia 03/01/2016  . Myelopathy ([place])   . Generalized weakness 02/19/2016  . Diaphragmatic injury as surgical complication 16/11/9602  . Postoperative anemia due to acute blood loss 08/14/2015  . Dementia without behavioral disturbance 08/14/2015  . Chronic UTI 07/29/2015  . 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 Klebsiella sepsis ([place]) 05/02/2015  . CAP (community acquired pneumonia) 05/02/2015  . Transaminasemia   . Sepsis ([place]) 04/22/2015  . Paroxysmal atrial fibrillation ([place]) 04/22/2015  . Delirium   . Urinary tract infectious disease   . Osteoarthritis 03/27/2015  . Cerebrovascular accident (CVA) due to thrombosis of cerebral artery  ([place]) 03/23/2015  . Moderate tricuspid regurgitation 03/11/2015  . Dilated cardiomyopathy ([place]) 03/11/2015  . Mitral regurgitation 03/11/2015  . Pulmonary arterial hypertension ([place]), mild 03/11/2015  . Mild cognitive impairment 02/24/2015  . Type 2 diabetes mellitus without complication, with long-term current use of insulin ([place]) 02/24/2015  . Hypertensive heart disease with CHF (congestive heart failure) ([place]) 02/24/2015  . Diabetes ([place]) 02/21/2015  . Essential hypertension, benign 02/21/2015  . Hyperlipidemia 02/19/2015  . Gout 02/19/2015  . PAD (peripheral artery disease) ([place]) 02/19/2015  . Cardiac pacemaker in situ 02/19/2015  . CKD (chronic kidney disease) 02/19/2015    CMP     Component Value Date/Time   NA 142 11/14/2016 1255   K 3.9 11/14/2016 1255   CL 98 11/14/2016 1255   CO2 24 11/14/2016 1255   GLUCOSE 199 (H) 11/14/2016 1255   GLUCOSE 139 (H) 02/23/2016 0605   BUN 25 11/14/2016 1255   CREATININE 1.16 (H) 11/14/2016 1255   CALCIUM 9.3 11/14/2016 1255   PROT 7.4 10/31/2016 1529   ALBUMIN 3.6 10/31/2016 1529   AST 29 10/31/2016 1529   ALT 33 (H) 10/31/2016 1529   ALKPHOS 179 (H) 10/31/2016 1529   BILITOT 0.4 10/31/2016 1529   GFRNONAA 44 (L) 11/14/2016 1255   GFRAA 50 (L) 11/14/2016 1255   Recent Labs    09/11/16 10/31/16 1529 11/14/16 1255  NA 141 142 142  K 4.3 3.7 3.9  CL  --  96 98  CO2  --  27 24  GLUCOSE  --  324* 199*  BUN 25* 21 25  CREATININE 1.2* 1.49* 1.16*  CALCIUM  --  9.1 9.3   Recent Labs    09/11/16 10/31/16 1529  AST 20 29  ALT 17 33*  ALKPHOS 136* 179*  BILITOT  --  0.4  PROT  --  7.4  ALBUMIN  --  3.6   Recent Labs    04/03/16 09/11/16 10/31/16 1529  WBC 8.8 8.7 8.6  HGB 12.8 11.9* 12.4  HCT 41 37 39.3  MCV  --   --  92  PLT 228 193 282   Recent Labs    04/03/16 09/11/16  CHOL 234* 203*  LDLCALC 135 121  TRIG 96 59   No results found for: [facility] Lab Results  Component Value Date   TSH 1.540 10/31/2016     Lab Results  Component Value Date   HGBA1C 8.6 09/11/2016   Lab Results  Component Value Date   CHOL 203 (A) 09/11/2016   HDL 70 09/11/2016   LDLCALC 121 09/11/2016   TRIG 59 09/11/2016   CHOLHDL 3.0 02/20/2016    Significant  Diagnostic Results in last 30 days:  No results found.  Assessment and Plan  Generalized weakness/right lung mass-Hyzaar this decline is due to patient's right lung mass. It's Friday afternoon so will order CBC CMP TSH vitamin D and UA with C&S. Results should be back on Monday. On Monday will order chest x-ray to assess tumor growth plus minus effusion. We will consider CT head, not for curative reasons but for palliative reasons.    Inocencio Homes, MD

## 2017-04-02 DIAGNOSIS — I70244 Atherosclerosis of native arteries of left leg with ulceration of heel and midfoot: Secondary | ICD-10-CM | POA: Diagnosis not present

## 2017-04-03 ENCOUNTER — Non-Acute Institutional Stay (SKILLED_NURSING_FACILITY): Payer: Medicare Other | Admitting: Internal Medicine

## 2017-04-03 ENCOUNTER — Encounter: Payer: Self-pay | Admitting: Internal Medicine

## 2017-04-03 ENCOUNTER — Ambulatory Visit (INDEPENDENT_AMBULATORY_CARE_PROVIDER_SITE_OTHER): Payer: Medicare Other | Admitting: *Deleted

## 2017-04-03 DIAGNOSIS — I5022 Chronic systolic (congestive) heart failure: Secondary | ICD-10-CM | POA: Diagnosis not present

## 2017-04-03 DIAGNOSIS — I495 Sick sinus syndrome: Secondary | ICD-10-CM

## 2017-04-03 DIAGNOSIS — N183 Chronic kidney disease, stage 3 unspecified: Secondary | ICD-10-CM

## 2017-04-03 DIAGNOSIS — E119 Type 2 diabetes mellitus without complications: Secondary | ICD-10-CM | POA: Diagnosis not present

## 2017-04-03 DIAGNOSIS — Z794 Long term (current) use of insulin: Secondary | ICD-10-CM

## 2017-04-03 NOTE — Progress Notes (Signed)
Location:  [facility] Room Number: 818E Place of Service:  SNF (31)  Hennie Duos, MD  Patient Care Team: Hennie Duos, MD as PCP - General (Internal Medicine) Jola Schmidt, MD as Consulting Physician (Ophthalmology) Cameron Sprang, MD as Consulting Physician (Neurology) Evans Lance, MD as Consulting Physician (Cardiology) Juanito Doom, MD as Consulting Physician (Pulmonary Disease)  Extended Emergency Contact Information Primary Emergency Contact: [name],[name] Address: [address]          [place], [place] [address] [name] of [place] Phone: [phone] Mobile Phone: [phone] Relation: Sister    Allergies: Patient has no known allergies.  Chief Complaint  Patient presents with  . Medical Management of Chronic Issues    Routine Visit    HPI: Patient is 84 y.o. female who is being seen for routine issues of chronic systolic congestive heart failure, diabetes mellitus type 2, and chronic kidney disease 3.  Past Medical History:  Diagnosis Date  . Arthritis   . Cancer [facility])    breast, left; s/p lumpectomy/chemo/radiation  . CAP (community acquired pneumonia) 05/02/2015  . Cardiac pacemaker in situ 02/19/2015  . Cerebrovascular accident (CVA) due to thrombosis of cerebral artery ([place]) 03/23/2015  . CKD (chronic kidney disease) 02/19/2015  . Degenerative cervical spinal stenosis 03/01/2016  . Dementia without behavioral disturbance 08/14/2015  . Depression   . Diabetes mellitus without complication ([place])   . Gout 02/19/2015  . H/O colon cancer, stage I 07/03/2016   s/p resection 2013 - colonic mucosa with high-grade dysplasia/adenocarcinoma in situ  . Hyperlipidemia   . Hypertension   . Hypertensive heart disease with CHF (congestive heart failure) ([place]) 02/24/2015  . Hypokalemia 03/01/2016  . Lung mass 04/21/2016  . Mild cognitive impairment 02/24/2015  . Mild mitral regurgitation 03/11/2015  . Moderate  tricuspid regurgitation 03/11/2015   02/2015   . Osteoarthritis 03/27/2015  . PAD (peripheral artery disease) ([place]) 02/19/2015   S/p stent in left leg   . Paroxysmal atrial fibrillation (HCC)   . Presence of permanent cardiac pacemaker   . Pulmonary arterial hypertension ([place]), mild 03/11/2015  . Renal disorder   . Spinal cord compression due to degenerative disorder of spinal column ([place]) 03/01/2016  . Transaminasemia   . Type 2 diabetes mellitus without complication, with long-term current use of insulin ([place]) 02/24/2015  . Vitamin B12 deficiency 07/30/2016  . 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 BREAST LUMPECTOMY Left   . CARDIAC PACEMAKER PLACEMENT    . CATARACT EXTRACTION    . EYE SURGERY    . kidney stone removal     x3  . LAPAROSCOPIC NEPHRECTOMY Left 07/29/2015   Procedure: LEFT LAPAROSCOPIC SIMPLE NEPHRECTOMY;  Surgeon: Ardis Hughs, MD;  Location: [facility] ORS;  Service: Urology;  Laterality: Left;  . LUMBAR SPINE SURGERY     laminectomy and rod    Allergies as of 04/03/2017   No Known Allergies     Medication List        Accurate as of 04/03/17 11:59 PM. Always use your most recent med list.          acetaminophen 325 MG tablet Commonly known as:  TYLENOL Take 650 mg by mouth every 6 (six) hours as needed.   allopurinol 300 MG tablet Commonly known as:  ZYLOPRIM TAKE ONE TABLET BY MOUTH ONCE DAILY   amiodarone 200 MG tablet Commonly known as:  PACERONE Take  1 tablet (200 mg total) by mouth daily.   apixaban 2.5 MG Tabs tablet Commonly known as:  ELIQUIS Take 2.5 mg by mouth 2 (two) times daily.   atorvastatin 80 MG tablet Commonly known as:  LIPITOR Take 1 tablet (80 mg total) by mouth daily with supper.   benazepril 40 MG tablet Commonly known as:  LOTENSIN Take 40 mg by mouth daily.   carvedilol 3.125 MG tablet Commonly known as:  COREG Take 1 tablet (3.125 mg total) by mouth 2 (two)  times daily with a meal.   donepezil 10 MG tablet Commonly known as:  ARICEPT Take 1 tablet (10 mg total) by mouth at bedtime.   furosemide 40 MG tablet Commonly known as:  LASIX Take 40 mg by mouth daily.   HUMALOG KWIKPEN 100 UNIT/ML KiwkPen Generic drug:  insulin lispro Inject 5 Units into the skin 3 (three) times daily before meals. When CBG is greater than 200; inject 8 units with a CBG greater than 300   hydrALAZINE 50 MG tablet Commonly known as:  APRESOLINE Take 50 mg by mouth 3 (three) times daily.   meclizine 25 MG tablet Commonly known as:  ANTIVERT Take 25 mg by mouth 3 (three) times daily as needed for dizziness.   TOUJEO SOLOSTAR 300 UNIT/ML Sopn Generic drug:  Insulin Glargine Inject 10 Units into the skin at bedtime.   WOMENS MULTI VITAMIN & MINERAL PO Take 1 tablet by mouth daily with lunch.       No orders of the defined types were placed in this encounter.   Immunization History  Administered Date(s) Administered  . Influenza, High Dose Seasonal PF 12/30/2015  . Influenza-Unspecified 12/01/2016  . PPD Test 04/26/2015, 08/02/2015, 02/23/2016  . Pneumococcal Conjugate-13 02/03/2017  . Tdap 02/27/2017    Social History   Tobacco Use  . Smoking status: Passive Smoke Exposure - Never Smoker  . Smokeless tobacco: Never Used  . Tobacco comment: pt's spouse was heavy smoker, smoked in home.   Substance Use Topics  . Alcohol use: No    Alcohol/week: 0.0 oz    Review of Systems  DATA OBTAINED: from patient, nurse GENERAL:  no fevers, fatigue, appetite changes SKIN: No itching, rash HEENT: No complaint RESPIRATORY: No cough, wheezing, SOB CARDIAC: No chest pain, palpitations, lower extremity edema  GI: No abdominal pain, No N/V/D or constipation, No heartburn or reflux  GU: No dysuria, frequency or urgency, or incontinence  MUSCULOSKELETAL: No unrelieved bone/joint pain NEUROLOGIC: No headache, dizziness  PSYCHIATRIC: No overt anxiety or  sadness  Vitals:   04/03/17 1044  BP: (!) 114/59  Pulse: 60  Resp: 20  Temp: (!) 97.4 F (36.3 C)  SpO2: 98%   Body mass index is 29.37 kg/m. Physical Exam  GENERAL APPEARANCE: Alert, conversant, No acute distress  SKIN: No diaphoresis rash HEENT: Unremarkable RESPIRATORY: Breathing is even, unlabored. Lung sounds are clear   CARDIOVASCULAR: Heart RRR no murmurs, rubs or gallops. No peripheral edema  GASTROINTESTINAL: Abdomen is soft, non-tender, not distended w/ normal bowel sounds.  GENITOURINARY: Bladder non tender, not distended  MUSCULOSKELETAL: No abnormal joints or musculature NEUROLOGIC: Cranial nerves 2-12 grossly intact. Moves all extremities PSYCHIATRIC: Mood and affect appropriate to situation, no behavioral issues  Patient Active Problem List   Diagnosis Date Noted  . Sinus node dysfunction ([place]) 02/18/2017  . Chronic systolic CHF (congestive heart failure) ([place]) 10/31/2016  . CAD (coronary artery disease) 10/31/2016  . Vitamin B12 deficiency 07/30/2016  . H/O colon cancer,  stage I 07/03/2016  . Lung mass 04/21/2016  . Degenerative cervical spinal stenosis 03/01/2016  . Spinal cord compression due to degenerative disorder of spinal column ([place]) 03/01/2016  . Hypokalemia 03/01/2016  . Myelopathy ([place])   . Generalized weakness 02/19/2016  . Diaphragmatic injury as surgical complication 37/62/8315  . Postoperative anemia due to acute blood loss 08/14/2015  . Dementia without behavioral disturbance 08/14/2015  . Chronic UTI 07/29/2015  . 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 Klebsiella sepsis ([place]) 05/02/2015  . CAP (community acquired pneumonia) 05/02/2015  . Transaminasemia   . Sepsis ([place]) 04/22/2015  . Paroxysmal atrial fibrillation ([place]) 04/22/2015  . Delirium   . Urinary tract infectious disease   . Osteoarthritis 03/27/2015  . Cerebrovascular accident (CVA) due to thrombosis of cerebral artery ([place]) 03/23/2015  . Moderate tricuspid  regurgitation 03/11/2015  . Dilated cardiomyopathy ([place]) 03/11/2015  . Mitral regurgitation 03/11/2015  . Pulmonary arterial hypertension ([place]), mild 03/11/2015  . Mild cognitive impairment 02/24/2015  . Type 2 diabetes mellitus without complication, with long-term current use of insulin ([place]) 02/24/2015  . Hypertensive heart disease with CHF (congestive heart failure) ([place]) 02/24/2015  . Diabetes ([place]) 02/21/2015  . Essential hypertension, benign 02/21/2015  . Hyperlipidemia 02/19/2015  . Gout 02/19/2015  . PAD (peripheral artery disease) ([place]) 02/19/2015  . Cardiac pacemaker in situ 02/19/2015  . CKD (chronic kidney disease) 02/19/2015    CMP     Component Value Date/Time   NA 142 11/14/2016 1255   K 3.9 11/14/2016 1255   CL 98 11/14/2016 1255   CO2 24 11/14/2016 1255   GLUCOSE 199 (H) 11/14/2016 1255   GLUCOSE 139 (H) 02/23/2016 0605   BUN 25 11/14/2016 1255   CREATININE 1.16 (H) 11/14/2016 1255   CALCIUM 9.3 11/14/2016 1255   PROT 7.4 10/31/2016 1529   ALBUMIN 3.6 10/31/2016 1529   AST 29 10/31/2016 1529   ALT 33 (H) 10/31/2016 1529   ALKPHOS 179 (H) 10/31/2016 1529   BILITOT 0.4 10/31/2016 1529   GFRNONAA 44 (L) 11/14/2016 1255   GFRAA 50 (L) 11/14/2016 1255   Recent Labs    09/11/16 10/31/16 1529 11/14/16 1255  NA 141 142 142  K 4.3 3.7 3.9  CL  --  96 98  CO2  --  27 24  GLUCOSE  --  324* 199*  BUN 25* 21 25  CREATININE 1.2* 1.49* 1.16*  CALCIUM  --  9.1 9.3   Recent Labs    09/11/16 10/31/16 1529  AST 20 29  ALT 17 33*  ALKPHOS 136* 179*  BILITOT  --  0.4  PROT  --  7.4  ALBUMIN  --  3.6   Recent Labs    09/11/16 10/31/16 1529  WBC 8.7 8.6  HGB 11.9* 12.4  HCT 37 39.3  MCV  --  92  PLT 193 282   Recent Labs    09/11/16  CHOL 203*  LDLCALC 121  TRIG 59   No results found for: [facility] Lab Results  Component Value Date   TSH 1.540 10/31/2016   Lab Results  Component Value Date   HGBA1C 8.6 09/11/2016   Lab Results  Component  Value Date   CHOL 203 (A) 09/11/2016   HDL 70 09/11/2016   LDLCALC 121 09/11/2016   TRIG 59 09/11/2016   CHOLHDL 3.0 02/20/2016    Significant Diagnostic Results in last 30 days:  No results found.  Assessment and Plan  CKD (chronic kidney disease) GFR 50; last creatinine 1.12 stable;  will monitor at intervals  Type 2 diabetes mellitus without complication, with long-term current use of insulin (HCC)  no reported blood sugars; continue Toujeo 10 units nightly and 5 units of NovoLog with every meal; patient is on ACE and on statin  Chronic systolic CHF (congestive heart failure) (HCC) Patient stable on Lasix 40 mg daily and Coreg 3.125 mg twice a day; patient is on ACE    Shadiyah Wernli D. Sheppard Coil, MD

## 2017-04-03 NOTE — Progress Notes (Signed)
Remote pacemaker transmission.   

## 2017-04-04 ENCOUNTER — Encounter: Payer: Self-pay | Admitting: Cardiology

## 2017-04-08 ENCOUNTER — Encounter: Payer: Self-pay | Admitting: Internal Medicine

## 2017-04-08 NOTE — Assessment & Plan Note (Signed)
no reported blood sugars; continue Toujeo 10 units nightly and 5 units of NovoLog with every meal; patient is on ACE and on statin

## 2017-04-08 NOTE — Assessment & Plan Note (Signed)
Patient stable on Lasix 40 mg daily and Coreg 3.125 mg twice a day; patient is on ACE

## 2017-04-08 NOTE — Assessment & Plan Note (Signed)
GFR 50; last creatinine 1.12 stable; will monitor at intervals

## 2017-04-09 DIAGNOSIS — I70234 Atherosclerosis of native arteries of right leg with ulceration of heel and midfoot: Secondary | ICD-10-CM | POA: Diagnosis not present

## 2017-04-10 DIAGNOSIS — R262 Difficulty in walking, not elsewhere classified: Secondary | ICD-10-CM | POA: Diagnosis not present

## 2017-04-10 DIAGNOSIS — R488 Other symbolic dysfunctions: Secondary | ICD-10-CM | POA: Diagnosis not present

## 2017-04-10 DIAGNOSIS — R2681 Unsteadiness on feet: Secondary | ICD-10-CM | POA: Diagnosis not present

## 2017-04-10 DIAGNOSIS — N189 Chronic kidney disease, unspecified: Secondary | ICD-10-CM | POA: Diagnosis not present

## 2017-04-10 DIAGNOSIS — M6281 Muscle weakness (generalized): Secondary | ICD-10-CM | POA: Diagnosis not present

## 2017-04-10 DIAGNOSIS — I429 Cardiomyopathy, unspecified: Secondary | ICD-10-CM | POA: Diagnosis not present

## 2017-04-10 DIAGNOSIS — R1310 Dysphagia, unspecified: Secondary | ICD-10-CM | POA: Diagnosis not present

## 2017-04-11 ENCOUNTER — Other Ambulatory Visit: Payer: Self-pay

## 2017-04-11 ENCOUNTER — Inpatient Hospital Stay (HOSPITAL_COMMUNITY)
Admission: AD | Admit: 2017-04-11 | Discharge: 2017-04-21 | DRG: 291 | Disposition: A | Discharge status: Skilled Nursing Facility | Payer: Medicare Other | Source: Ambulatory Visit | Attending: Family Medicine | Admitting: Family Medicine

## 2017-04-11 ENCOUNTER — Encounter: Payer: Self-pay | Admitting: Physician Assistant

## 2017-04-11 ENCOUNTER — Non-Acute Institutional Stay (SKILLED_NURSING_FACILITY): Payer: Medicare Other | Admitting: Internal Medicine

## 2017-04-11 ENCOUNTER — Ambulatory Visit: Payer: Medicare Other | Admitting: Physician Assistant

## 2017-04-11 VITALS — BP 100/42 | HR 60 | Ht 60.0 in | Wt 150.0 lb

## 2017-04-11 DIAGNOSIS — I469 Cardiac arrest, cause unspecified: Secondary | ICD-10-CM | POA: Diagnosis not present

## 2017-04-11 DIAGNOSIS — R2681 Unsteadiness on feet: Secondary | ICD-10-CM | POA: Diagnosis not present

## 2017-04-11 DIAGNOSIS — F0390 Unspecified dementia without behavioral disturbance: Secondary | ICD-10-CM | POA: Diagnosis not present

## 2017-04-11 DIAGNOSIS — I110 Hypertensive heart disease with heart failure: Secondary | ICD-10-CM

## 2017-04-11 DIAGNOSIS — R031 Nonspecific low blood-pressure reading: Secondary | ICD-10-CM

## 2017-04-11 DIAGNOSIS — Z79899 Other long term (current) drug therapy: Secondary | ICD-10-CM | POA: Diagnosis not present

## 2017-04-11 DIAGNOSIS — I2510 Atherosclerotic heart disease of native coronary artery without angina pectoris: Secondary | ICD-10-CM | POA: Diagnosis not present

## 2017-04-11 DIAGNOSIS — Z950 Presence of cardiac pacemaker: Secondary | ICD-10-CM | POA: Diagnosis not present

## 2017-04-11 DIAGNOSIS — I429 Cardiomyopathy, unspecified: Secondary | ICD-10-CM | POA: Diagnosis not present

## 2017-04-11 DIAGNOSIS — M6281 Muscle weakness (generalized): Secondary | ICD-10-CM | POA: Diagnosis not present

## 2017-04-11 DIAGNOSIS — R0602 Shortness of breath: Secondary | ICD-10-CM | POA: Diagnosis not present

## 2017-04-11 DIAGNOSIS — I2721 Secondary pulmonary arterial hypertension: Secondary | ICD-10-CM | POA: Diagnosis not present

## 2017-04-11 DIAGNOSIS — I420 Dilated cardiomyopathy: Secondary | ICD-10-CM | POA: Diagnosis present

## 2017-04-11 DIAGNOSIS — Z794 Long term (current) use of insulin: Secondary | ICD-10-CM

## 2017-04-11 DIAGNOSIS — M7989 Other specified soft tissue disorders: Secondary | ICD-10-CM | POA: Diagnosis present

## 2017-04-11 DIAGNOSIS — T464X5A Adverse effect of angiotensin-converting-enzyme inhibitors, initial encounter: Secondary | ICD-10-CM

## 2017-04-11 DIAGNOSIS — T464X1A Poisoning by angiotensin-converting-enzyme inhibitors, accidental (unintentional), initial encounter: Secondary | ICD-10-CM | POA: Diagnosis not present

## 2017-04-11 DIAGNOSIS — F05 Delirium due to known physiological condition: Secondary | ICD-10-CM | POA: Diagnosis present

## 2017-04-11 DIAGNOSIS — E11649 Type 2 diabetes mellitus with hypoglycemia without coma: Secondary | ICD-10-CM | POA: Diagnosis not present

## 2017-04-11 DIAGNOSIS — T783XXA Angioneurotic edema, initial encounter: Secondary | ICD-10-CM

## 2017-04-11 DIAGNOSIS — R262 Difficulty in walking, not elsewhere classified: Secondary | ICD-10-CM | POA: Diagnosis not present

## 2017-04-11 DIAGNOSIS — Z7722 Contact with and (suspected) exposure to environmental tobacco smoke (acute) (chronic): Secondary | ICD-10-CM | POA: Diagnosis not present

## 2017-04-11 DIAGNOSIS — Z9861 Coronary angioplasty status: Secondary | ICD-10-CM

## 2017-04-11 DIAGNOSIS — Z66 Do not resuscitate: Secondary | ICD-10-CM | POA: Diagnosis not present

## 2017-04-11 DIAGNOSIS — I4581 Long QT syndrome: Secondary | ICD-10-CM | POA: Diagnosis present

## 2017-04-11 DIAGNOSIS — N183 Chronic kidney disease, stage 3 (moderate): Secondary | ICD-10-CM | POA: Diagnosis not present

## 2017-04-11 DIAGNOSIS — E1122 Type 2 diabetes mellitus with diabetic chronic kidney disease: Secondary | ICD-10-CM | POA: Diagnosis present

## 2017-04-11 DIAGNOSIS — M109 Gout, unspecified: Secondary | ICD-10-CM | POA: Diagnosis present

## 2017-04-11 DIAGNOSIS — J910 Malignant pleural effusion: Secondary | ICD-10-CM | POA: Diagnosis present

## 2017-04-11 DIAGNOSIS — I5021 Acute systolic (congestive) heart failure: Secondary | ICD-10-CM | POA: Diagnosis not present

## 2017-04-11 DIAGNOSIS — I5022 Chronic systolic (congestive) heart failure: Secondary | ICD-10-CM | POA: Diagnosis not present

## 2017-04-11 DIAGNOSIS — Z923 Personal history of irradiation: Secondary | ICD-10-CM

## 2017-04-11 DIAGNOSIS — G301 Alzheimer's disease with late onset: Secondary | ICD-10-CM | POA: Diagnosis not present

## 2017-04-11 DIAGNOSIS — N184 Chronic kidney disease, stage 4 (severe): Secondary | ICD-10-CM | POA: Diagnosis not present

## 2017-04-11 DIAGNOSIS — Z8249 Family history of ischemic heart disease and other diseases of the circulatory system: Secondary | ICD-10-CM

## 2017-04-11 DIAGNOSIS — E785 Hyperlipidemia, unspecified: Secondary | ICD-10-CM | POA: Diagnosis not present

## 2017-04-11 DIAGNOSIS — G9341 Metabolic encephalopathy: Secondary | ICD-10-CM | POA: Diagnosis present

## 2017-04-11 DIAGNOSIS — Z6831 Body mass index (BMI) 31.0-31.9, adult: Secondary | ICD-10-CM

## 2017-04-11 DIAGNOSIS — I495 Sick sinus syndrome: Secondary | ICD-10-CM | POA: Diagnosis not present

## 2017-04-11 DIAGNOSIS — I428 Other cardiomyopathies: Secondary | ICD-10-CM | POA: Diagnosis not present

## 2017-04-11 DIAGNOSIS — I5020 Unspecified systolic (congestive) heart failure: Secondary | ICD-10-CM | POA: Diagnosis not present

## 2017-04-11 DIAGNOSIS — R609 Edema, unspecified: Secondary | ICD-10-CM | POA: Diagnosis not present

## 2017-04-11 DIAGNOSIS — I081 Rheumatic disorders of both mitral and tricuspid valves: Secondary | ICD-10-CM | POA: Diagnosis present

## 2017-04-11 DIAGNOSIS — R918 Other nonspecific abnormal finding of lung field: Secondary | ICD-10-CM | POA: Diagnosis not present

## 2017-04-11 DIAGNOSIS — I5023 Acute on chronic systolic (congestive) heart failure: Secondary | ICD-10-CM | POA: Diagnosis not present

## 2017-04-11 DIAGNOSIS — Z809 Family history of malignant neoplasm, unspecified: Secondary | ICD-10-CM

## 2017-04-11 DIAGNOSIS — I480 Paroxysmal atrial fibrillation: Secondary | ICD-10-CM | POA: Diagnosis not present

## 2017-04-11 DIAGNOSIS — E119 Type 2 diabetes mellitus without complications: Secondary | ICD-10-CM | POA: Diagnosis not present

## 2017-04-11 DIAGNOSIS — Z853 Personal history of malignant neoplasm of breast: Secondary | ICD-10-CM

## 2017-04-11 DIAGNOSIS — I462 Cardiac arrest due to underlying cardiac condition: Secondary | ICD-10-CM | POA: Diagnosis not present

## 2017-04-11 DIAGNOSIS — R601 Generalized edema: Secondary | ICD-10-CM

## 2017-04-11 DIAGNOSIS — I441 Atrioventricular block, second degree: Secondary | ICD-10-CM | POA: Diagnosis present

## 2017-04-11 DIAGNOSIS — I25118 Atherosclerotic heart disease of native coronary artery with other forms of angina pectoris: Secondary | ICD-10-CM | POA: Diagnosis not present

## 2017-04-11 DIAGNOSIS — I340 Nonrheumatic mitral (valve) insufficiency: Secondary | ICD-10-CM | POA: Diagnosis not present

## 2017-04-11 DIAGNOSIS — R600 Localized edema: Secondary | ICD-10-CM

## 2017-04-11 DIAGNOSIS — I509 Heart failure, unspecified: Secondary | ICD-10-CM

## 2017-04-11 DIAGNOSIS — I252 Old myocardial infarction: Secondary | ICD-10-CM

## 2017-04-11 DIAGNOSIS — I959 Hypotension, unspecified: Secondary | ICD-10-CM | POA: Diagnosis present

## 2017-04-11 DIAGNOSIS — N189 Chronic kidney disease, unspecified: Secondary | ICD-10-CM | POA: Diagnosis present

## 2017-04-11 DIAGNOSIS — J90 Pleural effusion, not elsewhere classified: Secondary | ICD-10-CM | POA: Diagnosis not present

## 2017-04-11 DIAGNOSIS — Z7901 Long term (current) use of anticoagulants: Secondary | ICD-10-CM | POA: Diagnosis not present

## 2017-04-11 DIAGNOSIS — E1151 Type 2 diabetes mellitus with diabetic peripheral angiopathy without gangrene: Secondary | ICD-10-CM | POA: Diagnosis present

## 2017-04-11 DIAGNOSIS — Z85038 Personal history of other malignant neoplasm of large intestine: Secondary | ICD-10-CM

## 2017-04-11 DIAGNOSIS — I130 Hypertensive heart and chronic kidney disease with heart failure and stage 1 through stage 4 chronic kidney disease, or unspecified chronic kidney disease: Principal | ICD-10-CM | POA: Diagnosis present

## 2017-04-11 DIAGNOSIS — Z833 Family history of diabetes mellitus: Secondary | ICD-10-CM

## 2017-04-11 DIAGNOSIS — I2583 Coronary atherosclerosis due to lipid rich plaque: Secondary | ICD-10-CM | POA: Diagnosis not present

## 2017-04-11 DIAGNOSIS — I5043 Acute on chronic combined systolic (congestive) and diastolic (congestive) heart failure: Secondary | ICD-10-CM | POA: Diagnosis present

## 2017-04-11 DIAGNOSIS — Z9221 Personal history of antineoplastic chemotherapy: Secondary | ICD-10-CM

## 2017-04-11 DIAGNOSIS — Z8673 Personal history of transient ischemic attack (TIA), and cerebral infarction without residual deficits: Secondary | ICD-10-CM

## 2017-04-11 DIAGNOSIS — F0280 Dementia in other diseases classified elsewhere without behavioral disturbance: Secondary | ICD-10-CM | POA: Diagnosis not present

## 2017-04-11 DIAGNOSIS — Z7189 Other specified counseling: Secondary | ICD-10-CM | POA: Diagnosis not present

## 2017-04-11 DIAGNOSIS — R1310 Dysphagia, unspecified: Secondary | ICD-10-CM | POA: Diagnosis not present

## 2017-04-11 DIAGNOSIS — I361 Nonrheumatic tricuspid (valve) insufficiency: Secondary | ICD-10-CM | POA: Diagnosis not present

## 2017-04-11 DIAGNOSIS — R627 Adult failure to thrive: Secondary | ICD-10-CM | POA: Diagnosis present

## 2017-04-11 DIAGNOSIS — Z45018 Encounter for adjustment and management of other part of cardiac pacemaker: Secondary | ICD-10-CM

## 2017-04-11 DIAGNOSIS — C3411 Malignant neoplasm of upper lobe, right bronchus or lung: Secondary | ICD-10-CM | POA: Diagnosis present

## 2017-04-11 DIAGNOSIS — R488 Other symbolic dysfunctions: Secondary | ICD-10-CM | POA: Diagnosis not present

## 2017-04-11 DIAGNOSIS — Z515 Encounter for palliative care: Secondary | ICD-10-CM | POA: Diagnosis not present

## 2017-04-11 DIAGNOSIS — R29818 Other symptoms and signs involving the nervous system: Secondary | ICD-10-CM | POA: Diagnosis not present

## 2017-04-11 LAB — COMPREHENSIVE METABOLIC PANEL
ALT: 34 U/L (ref 14–54)
ANION GAP: 11 (ref 5–15)
AST: 44 U/L — ABNORMAL HIGH (ref 15–41)
Albumin: 2.6 g/dL — ABNORMAL LOW (ref 3.5–5.0)
Alkaline Phosphatase: 151 U/L — ABNORMAL HIGH (ref 38–126)
BILIRUBIN TOTAL: 0.5 mg/dL (ref 0.3–1.2)
BUN: 59 mg/dL — ABNORMAL HIGH (ref 6–20)
CO2: 21 mmol/L — ABNORMAL LOW (ref 22–32)
Calcium: 9.1 mg/dL (ref 8.9–10.3)
Chloride: 102 mmol/L (ref 101–111)
Creatinine, Ser: 2.38 mg/dL — ABNORMAL HIGH (ref 0.44–1.00)
GFR calc Af Amer: 21 mL/min — ABNORMAL LOW (ref >60)
GFR, EST NON AFRICAN AMERICAN: 18 mL/min — AB (ref >60)
Glucose, Bld: 125 mg/dL — ABNORMAL HIGH (ref 65–99)
POTASSIUM: 4.5 mmol/L (ref 3.5–5.1)
Sodium: 134 mmol/L — ABNORMAL LOW (ref 135–145)
TOTAL PROTEIN: 6.9 g/dL (ref 6.5–8.1)

## 2017-04-11 LAB — GLUCOSE, CAPILLARY
Glucose-Capillary: 128 mg/dL — ABNORMAL HIGH (ref 65–99)
Glucose-Capillary: 144 mg/dL — ABNORMAL HIGH (ref 65–99)

## 2017-04-11 LAB — BRAIN NATRIURETIC PEPTIDE: B Natriuretic Peptide: 633.4 pg/mL — ABNORMAL HIGH (ref 0.0–100.0)

## 2017-04-11 LAB — CBC WITH DIFFERENTIAL/PLATELET
Basophils Absolute: 0 10*3/uL (ref 0.0–0.1)
Basophils Relative: 0 %
EOS PCT: 3 %
Eosinophils Absolute: 0.2 10*3/uL (ref 0.0–0.7)
HEMATOCRIT: 31.9 % — AB (ref 36.0–46.0)
Hemoglobin: 10.6 g/dL — ABNORMAL LOW (ref 12.0–15.0)
Lymphocytes Relative: 10 %
Lymphs Abs: 0.9 10*3/uL (ref 0.7–4.0)
MCH: 29 pg (ref 26.0–34.0)
MCHC: 33.2 g/dL (ref 30.0–36.0)
MCV: 87.2 fL (ref 78.0–100.0)
MONO ABS: 0.6 10*3/uL (ref 0.1–1.0)
MONOS PCT: 7 %
NEUTROS ABS: 6.8 10*3/uL (ref 1.7–7.7)
Neutrophils Relative %: 80 %
PLATELETS: 214 10*3/uL (ref 150–400)
RBC: 3.66 MIL/uL — ABNORMAL LOW (ref 3.87–5.11)
RDW: 18.1 % — AB (ref 11.5–15.5)
WBC: 8.5 10*3/uL (ref 4.0–10.5)

## 2017-04-11 LAB — PROTIME-INR
INR: 1.16
PROTHROMBIN TIME: 14.7 s (ref 11.4–15.2)

## 2017-04-11 LAB — APTT: APTT: 24 s (ref 24–36)

## 2017-04-11 LAB — TSH: TSH: 3.618 u[IU]/mL (ref 0.350–4.500)

## 2017-04-11 MED ORDER — DONEPEZIL HCL 10 MG PO TABS
10.0000 mg | ORAL_TABLET | Freq: Every day | ORAL | Status: DC
  Administered 2017-04-11 – 2017-04-20 (×10): 10 mg via ORAL
  Filled 2017-04-11 (×10): qty 1

## 2017-04-11 MED ORDER — ADULT MULTIVITAMIN W/MINERALS CH
1.0000 | ORAL_TABLET | Freq: Every day | ORAL | Status: DC
  Administered 2017-04-13 – 2017-04-21 (×8): 1 via ORAL
  Filled 2017-04-11 (×18): qty 1

## 2017-04-11 MED ORDER — SODIUM CHLORIDE 0.9% FLUSH
3.0000 mL | Freq: Two times a day (BID) | INTRAVENOUS | Status: DC
  Administered 2017-04-11 – 2017-04-21 (×16): 3 mL via INTRAVENOUS

## 2017-04-11 MED ORDER — INSULIN GLARGINE 100 UNIT/ML ~~LOC~~ SOLN
10.0000 [IU] | Freq: Every day | SUBCUTANEOUS | Status: DC
  Administered 2017-04-11 – 2017-04-18 (×6): 10 [IU] via SUBCUTANEOUS
  Filled 2017-04-11 (×9): qty 0.1

## 2017-04-11 MED ORDER — AMIODARONE HCL 200 MG PO TABS
200.0000 mg | ORAL_TABLET | Freq: Every day | ORAL | Status: DC
  Administered 2017-04-11 – 2017-04-21 (×11): 200 mg via ORAL
  Filled 2017-04-11 (×11): qty 1

## 2017-04-11 MED ORDER — CARVEDILOL 3.125 MG PO TABS
3.1250 mg | ORAL_TABLET | Freq: Two times a day (BID) | ORAL | Status: DC
  Administered 2017-04-11 – 2017-04-16 (×9): 3.125 mg via ORAL
  Filled 2017-04-11 (×11): qty 1

## 2017-04-11 MED ORDER — MECLIZINE HCL 25 MG PO TABS: 25.0000 mg | ORAL_TABLET | Freq: Three times a day (TID) | ORAL | Status: DC | PRN

## 2017-04-11 MED ORDER — HYDRALAZINE HCL 50 MG PO TABS
50.0000 mg | ORAL_TABLET | Freq: Three times a day (TID) | ORAL | Status: DC
  Administered 2017-04-11 – 2017-04-16 (×12): 50 mg via ORAL
  Filled 2017-04-11 (×16): qty 1

## 2017-04-11 MED ORDER — APIXABAN 2.5 MG PO TABS
2.5000 mg | ORAL_TABLET | Freq: Two times a day (BID) | ORAL | Status: DC
  Administered 2017-04-11 – 2017-04-21 (×20): 2.5 mg via ORAL
  Filled 2017-04-11 (×20): qty 1

## 2017-04-11 MED ORDER — SODIUM CHLORIDE 0.9% FLUSH: 3.0000 mL | INTRAVENOUS | Status: DC | PRN

## 2017-04-11 MED ORDER — ONDANSETRON HCL 4 MG/2ML IJ SOLN: 4.0000 mg | Freq: Four times a day (QID) | INTRAMUSCULAR | Status: DC | PRN

## 2017-04-11 MED ORDER — ACETAMINOPHEN 325 MG PO TABS
650.0000 mg | ORAL_TABLET | Freq: Four times a day (QID) | ORAL | Status: DC | PRN
  Administered 2017-04-13 – 2017-04-14 (×2): 650 mg via ORAL
  Filled 2017-04-11 (×2): qty 2

## 2017-04-11 MED ORDER — FUROSEMIDE 10 MG/ML IJ SOLN
40.0000 mg | Freq: Two times a day (BID) | INTRAMUSCULAR | Status: DC
  Administered 2017-04-11 – 2017-04-12 (×2): 40 mg via INTRAVENOUS
  Filled 2017-04-11 (×2): qty 4

## 2017-04-11 MED ORDER — SODIUM CHLORIDE 0.9 % IV SOLN: 250.0000 mL | INTRAVENOUS | Status: DC | PRN

## 2017-04-11 MED ORDER — ATORVASTATIN CALCIUM 80 MG PO TABS
80.0000 mg | ORAL_TABLET | Freq: Every day | ORAL | Status: DC
  Administered 2017-04-11 – 2017-04-20 (×9): 80 mg via ORAL
  Filled 2017-04-11 (×11): qty 1

## 2017-04-11 MED ORDER — ALLOPURINOL 300 MG PO TABS
300.0000 mg | ORAL_TABLET | Freq: Every day | ORAL | Status: DC
  Administered 2017-04-11 – 2017-04-21 (×11): 300 mg via ORAL
  Filled 2017-04-11 (×11): qty 1

## 2017-04-11 MED ORDER — INSULIN ASPART 100 UNIT/ML ~~LOC~~ SOLN
5.0000 [IU] | Freq: Three times a day (TID) | SUBCUTANEOUS | Status: DC
  Administered 2017-04-12 – 2017-04-16 (×10): 5 [IU] via SUBCUTANEOUS
  Filled 2017-04-11 (×27): qty 0.05

## 2017-04-11 NOTE — H&P (Addendum)
Cardiology Admission History and Physical:   Patient ID: Allison Dunn; MRN: [id number]; DOB: [birth year]   Admission date: (Not on file)  Primary Care Provider: Hennie Duos, MD Primary Cardiologist: Cristopher Peru, MD     Chief Complaint:  Edema   Patient Profile:   Allison Dunn is a 84 y.o. female with coronary artery disease status post prior angioplasty in [place], peripheral vascular disease, paroxysmal atrial fibrillation, sinus node dysfunction status post pacemaker, dilated cardiomyopathy (EF 00-17) with systolic heart failure, mitral regurgitation, diabetes, hypertension, dementia, lung mass who presents to the office with swelling and weight gain.  History of Present Illness:   Allison Dunn moved to [place] from [place] the last couple of years.  She has a remote history of coronary angioplasty and sinus node dysfunction status post pacemaker.  She has an EF was 20-25% on echocardiogram in January 2018.  She was evaluated by pulmonology (Dr. Lake Bells) for pulmonary mass suspicious for malignancy.  She and her family have chosen to pursue conservative management without invasive diagnostic studies.  I was contacted today (prior to the patient's visit) by Dr. Sheppard Coil who sees her at [facility].  She notes that the patient has had left upper extremity and left facial edema for the last several days.  Because of lip swelling, her ACE inhibitor was discontinued.  Her blood pressures have been running lower than normal.   The patient is here today with her daughter.  She has evidence of anasarca that is noticeable as I walk in the room. She is not that active and has not noted much shortness of breath.  She denies chest pain.  She denies syncope.  She does admit to paroxysmal nocturnal dyspnea.  She has gained 13 lbs since 12/2016.    Past Medical History:  Diagnosis Date  . Arthritis   . CAD (coronary artery disease) 10/31/2016   History of angioplasty while in  [place]  //  Nuclear stress test 03/03/14 ([place]): Anteroseptal and apical infarct, no ischemia, EF 46  . Cancer [facility])    breast, left; s/p lumpectomy/chemo/radiation  . CAP (community acquired pneumonia) 05/02/2015  . Cardiac pacemaker in situ 02/19/2015  . Chronic systolic CHF (congestive heart failure) (HCC)    Echo 1/16: EF 50, mild MR, mild TR //Echo 1/17: EF 40-45, apical AK, grade 2 diastolic dysfunction, mild MR, mild LAE, moderate TR, PASP 44 //Echo 1/18: Moderate LVH, EF 20-25, anterior AK, moderate to severe MR, moderate LAE, PASP 34  . CKD (chronic kidney disease) 02/19/2015  . Degenerative cervical spinal stenosis 03/01/2016  . Dementia without behavioral disturbance 08/14/2015  . Depression   . Diabetes mellitus without complication ([place])   . Gout 02/19/2015  . H/O colon cancer, stage I 07/03/2016   s/p resection 2013 - colonic mucosa with high-grade dysplasia/adenocarcinoma in situ  . History of stroke 03/23/2015  . Hyperlipidemia   . Hypertensive heart disease with CHF (congestive heart failure) ([place]) 02/24/2015  . Lung mass 04/21/2016  . Mild cognitive impairment 02/24/2015  . Mild mitral regurgitation 03/11/2015  . Moderate tricuspid regurgitation 03/11/2015   02/2015   . Osteoarthritis 03/27/2015  . PAD (peripheral artery disease) ([place]) 02/19/2015   S/p stent in left leg // carotid US 12/17: Bilateral ICA 1-39  . Paroxysmal atrial fibrillation (HCC)   . Pulmonary arterial hypertension ([place]), mild 03/11/2015  . Spinal cord compression due to degenerative disorder of spinal column ([place]) 03/01/2016  . Transaminasemia   .  Type 2 diabetes mellitus without complication, with long-term current use of insulin ([place]) 02/24/2015  . Vitamin B12 deficiency 07/30/2016  . 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 BREAST LUMPECTOMY Left   . CARDIAC PACEMAKER PLACEMENT    . CATARACT EXTRACTION    . EYE SURGERY    . kidney stone  removal     x3  . LAPAROSCOPIC NEPHRECTOMY Left 07/29/2015   Procedure: LEFT LAPAROSCOPIC SIMPLE NEPHRECTOMY;  Surgeon: Ardis Hughs, MD;  Location: [facility];  Service: Urology;  Laterality: Left;  . LUMBAR SPINE SURGERY     laminectomy and rod     Medications Prior to Admission: Prior to Admission medications   Medication Sig Start Date End Date Taking? Authorizing Provider  acetaminophen (TYLENOL) 325 MG tablet Take 650 mg by mouth every 6 (six) hours as needed.    [provider]  allopurinol (ZYLOPRIM) 300 MG tablet TAKE ONE TABLET BY MOUTH ONCE DAILY 12/27/15   Binnie Rail, MD  amiodarone (PACERONE) 200 MG tablet Take 1 tablet (200 mg total) by mouth daily. 09/03/15   Binnie Rail, MD  apixaban (ELIQUIS) 2.5 MG TABS tablet Take 2.5 mg by mouth 2 (two) times daily.    [provider]  atorvastatin (LIPITOR) 80 MG tablet Take 1 tablet (80 mg total) by mouth daily with supper. 09/03/15   Binnie Rail, MD  carvedilol (COREG) 3.125 MG tablet Take 1 tablet (3.125 mg total) by mouth 2 (two) times daily with a meal. 11/14/16   Weaver, Scott T, PA-C  donepezil (ARICEPT) 10 MG tablet Take 1 tablet (10 mg total) by mouth at bedtime. 10/19/15   Cameron Sprang, MD  furosemide (LASIX) 80 MG tablet Take 80 mg by mouth daily.    [provider]  HUMALOG KWIKPEN 100 UNIT/ML KiwkPen Inject 5 Units into the skin 3 (three) times daily before meals. When CBG is greater than 200; inject 8 units with a CBG greater than 300 05/09/16   [provider]  hydrALAZINE (APRESOLINE) 100 MG tablet Take 100 mg by mouth 3 (three) times daily.    [provider]  Insulin Glargine (TOUJEO SOLOSTAR) 300 UNIT/ML SOPN Inject 10 Units into the skin at bedtime.     [provider]  meclizine (ANTIVERT) 25 MG tablet Take 25 mg by mouth 3 (three) times daily as needed for dizziness.    [provider]  Multiple Vitamins-Minerals (WOMENS MULTI VITAMIN & MINERAL PO)  Take 1 tablet by mouth daily with lunch.     [provider]     Allergies:   No Known Allergies  Social History:   Social History   Socioeconomic History  . Marital status: Widowed    Spouse name: Not on file  . Number of children: 0  . Years of education: Not on file  . Highest education level: Not on file  Social Needs  . Financial resource strain: Not on file  . Food insecurity - worry: Not on file  . Food insecurity - inability: Not on file  . Transportation needs - medical: Not on file  . Transportation needs - non-medical: Not on file  Occupational History  . Occupation: retired [employer]  Tobacco Use  . Smoking status: Passive Smoke Exposure - Never Smoker  . Smokeless tobacco: Never Used  . Tobacco comment: pt's spouse was heavy smoker, smoked in home.   Substance and Sexual Activity  . Alcohol use:  No    Alcohol/week: 0.0 oz  . Drug use: No  . Sexual activity: No  Other Topics Concern  . Not on file  Social History Narrative   Admitted to [facility] 02/23/16   Widowed   Never smoked   Alcohol none   DNR    Family History:   The patient's family history includes Asthma in her sister; Cancer in her mother; Diabetes in her sister; Hypertension in her brother and sister.    ROS:  Please see the history of present illness.  +Weight gain  + Edema All other ROS reviewed and negative.     Physical Exam/Data:  There were no vitals filed for this visit. No intake or output data in the 24 hours ending 04/11/17 1627 There were no vitals filed for this visit. There is no height or weight on file to calculate BMI.  Constitutional: She is oriented to person, place, and time. She appears well-developed and well-nourished. No distress.  +facial edema  HENT:  Head: Normocephalic and atraumatic.  Neck: JVD (JVP up to angle of jaw) present.  Cardiovascular: Normal rate and regular rhythm.  No murmur heard. Pulmonary/Chest: Effort normal. She has no  rales.  Abdominal: She exhibits distension.  Musculoskeletal: She exhibits edema (2+ pitting edema up to the thigh bilat).  +bilat upper ext edema (L>R); +presacral edema  Neurological: She is alert and oriented to person, place, and time.  Skin: Skin is warm and dry.   EKG:  N/a  Relevant CV Studies: Echocardiogram 02/21/16 Moderate LVH, EF 20-25, anterior akinesis, moderate to severe MR, moderate LAE, PASP 34  Carotid US 02/20/16 Bilateral ICA 1-39  Echocardiogram 03/09/15 EF 40-45, apical akinesis, grade 2 diastolic dysfunction, mild MAC, mild MR, mild LAE, moderate TR, PASP 44  Nuclear stress test 03/03/14 [facility]) Anteroseptal and apical infarct, no ischemia, EF 46  Echo 1/16 [facility]) EF 50, mild MR, mild TR   Laboratory Data:  ChemistryNo results for input(s): NA, K, CL, CO2, GLUCOSE, BUN, CREATININE, CALCIUM, GFRNONAA, GFRAA, ANIONGAP in the last 168 hours.  No results for input(s): PROT, ALBUMIN, AST, ALT, ALKPHOS, BILITOT in the last 168 hours. HematologyNo results for input(s): WBC, RBC, HGB, HCT, MCV, MCH, MCHC, RDW, PLT in the last 168 hours. Cardiac EnzymesNo results for input(s): TROPONINI in the last 168 hours. No results for input(s): TROPIPOC in the last 168 hours.  BNPNo results for input(s): BNP, PROBNP in the last 168 hours.  DDimer No results for input(s): DDIMER in the last 168 hours.  Radiology/Studies:  No results found.  Assessment and Plan:   1.  Acute on chronic systolic heart failure (HCC) EF 20-25% by echocardiogram in 2018.  She presents with at least a 13 pound weight gain.  She has evidence of anasarca with bilateral upper extremity and lower extremity edema as well as presacral edema and facial edema.  Likely, her lungs sound clear.  In any event, I believe that she needs admission to the hospital for IV diuresis.  I discussed this with Dr. Angelena Form (attending MD) who agreed.  -Admit to [facility]  -Lasix 40 mg IV twice daily  -Labs:  CMET, CBC, TSH, BNP, chest x-ray  -Repeat echocardiogram  2.  Coronary artery disease  Remote history of angioplasty while in [place].  She denies symptoms consistent with angina.  She is not on aspirin as she is on apixaban.  Continue statin.  3.  Hypertensive heart disease with chronic systolic  congestive heart failure (HCC) Her blood pressure has been running lower than normal.  Question if she has an element of low output heart failure.  Her benazepril was stopped secondary to concerns of angioedema.  Decrease hydralazine to 50 mg 3 times a day.  4.  Lung mass She has a history of lung mass that was concerning for malignancy.  She has opted for conservative management.  When Dr. Sheppard Coil called me earlier, I was concerned about mass-effect contributing to SVC syndrome.  However, her edema seems to be more global.  In any event, we will ask internal medicine to follow along should her picture be more consistent with metastatic disease.  5.  Cardiac pacemaker in situ She will follow-up with EP as planned.  6.  Paroxysmal atrial fibrillation (HCC) Continue Apixaban, amiodarone.    Severity of Illness: The appropriate patient status for this patient is INPATIENT. Inpatient status is judged to be reasonable and necessary in order to provide the required intensity of service to ensure the patient's safety. The patient's presenting symptoms, physical exam findings, and initial radiographic and laboratory data in the context of their chronic comorbidities is felt to place them at high risk for further clinical deterioration. Furthermore, it is not anticipated that the patient will be medically stable for discharge from the hospital within 2 midnights of admission. The following factors support the patient status of inpatient.   " The patient's presenting symptoms include edema, low blood pressure, weight gain. " The worrisome physical exam findings include edema. " The initial radiographic  and laboratory data are worrisome because of n/a. " The chronic co-morbidities include CAD, heart failure, atrial fibrillation, lung mass.   * I certify that at the point of admission it is my clinical judgment that the patient will require inpatient hospital care spanning beyond 2 midnights from the point of admission due to high intensity of service, high risk for further deterioration and high frequency of surveillance required.*    For questions or updates, please contact [place] Please consult [email or web address] for contact info under Cardiology/STEMI.    Signed, Richardson Dopp, PA-C  04/11/2017 4:27 PM    I have personally seen and examined this patient. I agree with the assessment and plan as outlined above.  She is presenting with anasarca, orthopnea and PND. She is known to have a non-ischemic cardiomyopathy.  Will admit to [facility], repeat echo and begin diuresis with IV lasix. May need to consider repeat imaging of lung mass. Will ask IM to see. Labs as above.  My exam:  General: Well developed, well nourished, NAD  HEENT: OP clear, mucus membranes moist  SKIN: warm, dry. No rashes. Neuro: No focal deficits  Musculoskeletal: Muscle strength 5/5 all ext  Psychiatric: Mood and affect normal  Neck: + JVD, no carotid bruits, no thyromegaly, no lymphadenopathy.  Lungs:Clear bilaterally, no wheezes, rhonci, crackles Cardiovascular: Regular rate and rhythm. No murmurs, gallops or rubs. Abdomen:distened.  Bowel sounds present. Non-tender.  Extremities: 2+ edema in both arms and legs.   Lauree Chandler  04/11/2017 4:54 PM

## 2017-04-11 NOTE — Progress Notes (Signed)
Cardiology Office Note:    Date:  04/11/2017   ID:  Allison Dunn, DOB May 30, 1931, MRN [id number]  PCP:  Allison Duos, MD  Cardiologist:  Allison Peru, MD   Referring MD: Allison Duos, MD   Chief Complaint  Patient presents with  . Leg Swelling    History of Present Illness:    Allison Dunn is a 84 y.o. female with coronary artery disease status post prior angioplasty in [place], peripheral vascular disease, paroxysmal atrial fibrillation, sinus node dysfunction status post pacemaker, dilated cardiomyopathy with systolic heart failure, mitral regurgitation, diabetes, hypertension, dementia, lung mass.  EF was 20-25% on echocardiogram in January 2018.  She has been evaluated by pulmonology (Dr. Lake Dunn) for pulmonary mass suspicious for malignancy.  She and her family have chosen to pursue conservative management without invasive diagnostic studies.  Last seen by Dr. Lovena Dunn November 2018.  Allison Dunn returns for follow-up.  I was contacted today by Allison Dunn who sees her at [facility].  Patient has had left upper extremity and left facial edema for the last several days.  Because of lip swelling, her ACE inhibitor was discontinued.  Her blood pressures have been running lower than normal.    She returns today with her daughter.  She has evidence of anasarca that is noticeable as I walk in the room. She is not that active and has not noted much shortness of breath.  She denies chest pain.  She denies syncope.  She does admit to paroxysmal nocturnal dyspnea.  She has gained 13 lbs since 12/2016.    Prior CV studies:   The following studies were reviewed today:  Echocardiogram 02/21/16 Moderate LVH, EF 20-25, anterior akinesis, moderate to severe MR, moderate LAE, PASP 34  Carotid US 02/20/16 Bilateral ICA 1-39  Echocardiogram 03/09/15 EF 40-45, apical akinesis, grade 2 diastolic dysfunction, mild MAC, mild MR, mild LAE, moderate TR, PASP 44  Nuclear  stress test 03/03/14 [facility]) Anteroseptal and apical infarct, no ischemia, EF 46  Echo 1/16 ([place]) EF 50, mild MR, mild TR  Past Medical History:  Diagnosis Date  . Arthritis   . CAD (coronary artery disease) 10/31/2016   History of angioplasty while in [place]  //  Nuclear stress test 03/03/14 ([place]): Anteroseptal and apical infarct, no ischemia, EF 46  . Cancer [facility])    breast, left; s/p lumpectomy/chemo/radiation  . CAP (community acquired pneumonia) 05/02/2015  . Cardiac pacemaker in situ 02/19/2015  . Chronic systolic CHF (congestive heart failure) (HCC)    Echo 1/16: EF 50, mild MR, mild TR //Echo 1/17: EF 40-45, apical AK, grade 2 diastolic dysfunction, mild MR, mild LAE, moderate TR, PASP 44 //Echo 1/18: Moderate LVH, EF 20-25, anterior AK, moderate to severe MR, moderate LAE, PASP 34  . CKD (chronic kidney disease) 02/19/2015  . Degenerative cervical spinal stenosis 03/01/2016  . Dementia without behavioral disturbance 08/14/2015  . Depression   . Diabetes mellitus without complication ([place])   . Gout 02/19/2015  . H/O colon cancer, stage I 07/03/2016   s/p resection 2013 - colonic mucosa with high-grade dysplasia/adenocarcinoma in situ  . History of stroke 03/23/2015  . Hyperlipidemia   . Hypertensive heart disease with CHF (congestive heart failure) ([place]) 02/24/2015  . Lung mass 04/21/2016  . Mild cognitive impairment 02/24/2015  . Mild mitral regurgitation 03/11/2015  . Moderate tricuspid regurgitation 03/11/2015   02/2015   . Osteoarthritis 03/27/2015  . PAD (peripheral artery disease) ([place])  02/19/2015   S/p stent in left leg // carotid US 12/17: Bilateral ICA 1-39  . Paroxysmal atrial fibrillation (HCC)   . Pulmonary arterial hypertension ([place]), mild 03/11/2015  . Spinal cord compression due to degenerative disorder of spinal column ([place]) 03/01/2016  . Transaminasemia   . Type 2 diabetes mellitus without complication, with long-term current use of insulin ([place])  02/24/2015  . Vitamin B12 deficiency 07/30/2016  . 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 BREAST LUMPECTOMY Left   . CARDIAC PACEMAKER PLACEMENT    . CATARACT EXTRACTION    . EYE SURGERY    . kidney stone removal     x3  . LAPAROSCOPIC NEPHRECTOMY Left 07/29/2015   Procedure: LEFT LAPAROSCOPIC SIMPLE NEPHRECTOMY;  Surgeon: Ardis Hughs, MD;  Location: [facility];  Service: Urology;  Laterality: Left;  . LUMBAR SPINE SURGERY     laminectomy and rod    Current Medications: Current Meds  Medication Sig  . acetaminophen (TYLENOL) 325 MG tablet Take 650 mg by mouth every 6 (six) hours as needed.  Marland Kitchen allopurinol (ZYLOPRIM) 300 MG tablet TAKE ONE TABLET BY MOUTH ONCE DAILY  . amiodarone (PACERONE) 200 MG tablet Take 1 tablet (200 mg total) by mouth daily.  Marland Kitchen apixaban (ELIQUIS) 2.5 MG TABS tablet Take 2.5 mg by mouth 2 (two) times daily.  Marland Kitchen atorvastatin (LIPITOR) 80 MG tablet Take 1 tablet (80 mg total) by mouth daily with supper.  . carvedilol (COREG) 3.125 MG tablet Take 1 tablet (3.125 mg total) by mouth 2 (two) times daily with a meal.  . donepezil (ARICEPT) 10 MG tablet Take 1 tablet (10 mg total) by mouth at bedtime.  . furosemide (LASIX) 80 MG tablet Take 80 mg by mouth daily.  Marland Kitchen HUMALOG KWIKPEN 100 UNIT/ML KiwkPen Inject 5 Units into the skin 3 (three) times daily before meals. When CBG is greater than 200; inject 8 units with a CBG greater than 300  . hydrALAZINE (APRESOLINE) 100 MG tablet Take 100 mg by mouth 3 (three) times daily.  . Insulin Glargine (TOUJEO SOLOSTAR) 300 UNIT/ML SOPN Inject 10 Units into the skin at bedtime.   . meclizine (ANTIVERT) 25 MG tablet Take 25 mg by mouth 3 (three) times daily as needed for dizziness.  . Multiple Vitamins-Minerals (WOMENS MULTI VITAMIN & MINERAL PO) Take 1 tablet by mouth daily with lunch.      Allergies:   Patient has no known allergies.   Social History   Tobacco Use  .  Smoking status: Passive Smoke Exposure - Never Smoker  . Smokeless tobacco: Never Used  . Tobacco comment: pt's spouse was heavy smoker, smoked in home.   Substance Use Topics  . Alcohol use: No    Alcohol/week: 0.0 oz  . Drug use: No     Family Hx: The patient's family history includes Asthma in her sister; Cancer in her mother; Diabetes in her sister; Hypertension in her brother and sister.  ROS:   Please see the history of present illness.    Review of Systems  Constitution: Positive for diaphoresis and weight gain.  Cardiovascular: Positive for leg swelling.   All other systems reviewed and are negative.   EKGs/Labs/Other Test Reviewed:    EKG:  EKG is not ordered today.    Recent Labs: 10/31/2016: ALT 33; Hemoglobin 12.4; NT-Pro BNP 2,831; Platelets 282; TSH 1.540 11/14/2016: BUN 25; Creatinine, Ser 1.16; Potassium 3.9; Sodium 142   Recent Lipid Panel  Lab Results  Component Value Date/Time   CHOL 203 (A) 09/11/2016   TRIG 59 09/11/2016   HDL 70 09/11/2016   CHOLHDL 3.0 02/20/2016 04:40 AM   LDLCALC 121 09/11/2016    Physical Exam:    VS:  BP (!) 100/42   Pulse 60   Ht 5' (1.524 m)   Wt 150 lb (68 kg)   LMP  (LMP Unknown)   SpO2 95%   BMI 29.29 kg/m     Wt Readings from Last 3 Encounters:  04/11/17 150 lb (68 kg)  04/03/17 150 lb 6.4 oz (68.2 kg)  03/07/17 135 lb (61.2 kg)     Physical Exam  Constitutional: She is oriented to person, place, and time. She appears well-developed and well-nourished. No distress.  +facial edema  HENT:  Head: Normocephalic and atraumatic.  Neck: JVD (JVP up to angle of jaw) present.  Cardiovascular: Normal rate and regular rhythm.  No murmur heard. Pulmonary/Chest: Effort normal. She has no rales.  Abdominal: She exhibits distension.  Musculoskeletal: She exhibits edema (2+ pitting edema up to the thigh bilat).  +bilat upper ext edema (L>R); +presacral edema  Neurological: She is alert and oriented to person, place, and  time.  Skin: Skin is warm and dry.    ASSESSMENT & PLAN:    1.  Acute on chronic systolic heart failure (HCC) EF 20-25% by echocardiogram in 2018.  She presents with at least a 13 pound weight gain.  She has evidence of anasarca with bilateral upper extremity and lower extremity edema as well as presacral edema and facial edema.  Likely, her lungs sound clear.  In any event, I believe that she needs admission to the hospital for IV diuresis.  I discussed this with Dr. Angelena Form (attending MD) who agreed.             -Admit to [facility]             -Lasix 40 mg IV twice daily             -Labs: CMET, CBC, TSH, BNP, chest x-ray             -Repeat echocardiogram  2.  Coronary artery disease  Remote history of angioplasty while in [place].  She denies symptoms consistent with angina.  She is not on aspirin as she is on apixaban.  Continue statin.  3.  Hypertensive heart disease with chronic systolic congestive heart failure (HCC) Her blood pressure has been running lower than normal.  Question if she has an element of low output heart failure.  Her benazepril was stopped secondary to concerns of angioedema.  Decrease hydralazine to 50 mg 3 times a day.  4.  Lung mass She has a history of lung mass that was concerning for malignancy.  She has opted for conservative management.  When Allison Dunn called me earlier, I was concerned about mass-effect contributing to SVC syndrome.  However, her edema seems to be more global.  In any event, we will ask internal medicine to follow along should her picture be more consistent with metastatic disease.  5.  Cardiac pacemaker in situ She will follow-up with EP as planned.  6. Paroxysmal atrial fibrillation (HCC) Continue Apixaban, amiodarone.    Dispo:  Return for [facility] Follow Up.   Medication Adjustments/Labs and Tests Ordered: Current medicines are reviewed at length with the patient today.  Concerns regarding medicines are  outlined above.  Tests Ordered: No orders of the defined  types were placed in this encounter.  Medication Changes: No orders of the defined types were placed in this encounter.   Signed, Richardson Dopp, PA-C  04/11/2017 5:05 PM    [facility] [address] [place], [place], [place]  [address] Phone: [phone]; Fax: [phone]

## 2017-04-11 NOTE — Patient Instructions (Signed)
YOU HAVE BEEN ADMITTED TO [facility]

## 2017-04-12 ENCOUNTER — Encounter (HOSPITAL_COMMUNITY): Payer: Self-pay | Admitting: General Practice

## 2017-04-12 ENCOUNTER — Inpatient Hospital Stay (HOSPITAL_COMMUNITY): Payer: Medicare Other

## 2017-04-12 DIAGNOSIS — I340 Nonrheumatic mitral (valve) insufficiency: Secondary | ICD-10-CM

## 2017-04-12 DIAGNOSIS — I361 Nonrheumatic tricuspid (valve) insufficiency: Secondary | ICD-10-CM

## 2017-04-12 LAB — BASIC METABOLIC PANEL
Anion gap: 11 (ref 5–15)
BUN: 60 mg/dL — AB (ref 6–20)
CALCIUM: 8.9 mg/dL (ref 8.9–10.3)
CO2: 22 mmol/L (ref 22–32)
Chloride: 103 mmol/L (ref 101–111)
Creatinine, Ser: 2.25 mg/dL — ABNORMAL HIGH (ref 0.44–1.00)
GFR calc Af Amer: 22 mL/min — ABNORMAL LOW (ref >60)
GFR, EST NON AFRICAN AMERICAN: 19 mL/min — AB (ref >60)
GLUCOSE: 78 mg/dL (ref 65–99)
POTASSIUM: 4.4 mmol/L (ref 3.5–5.1)
Sodium: 136 mmol/L (ref 135–145)

## 2017-04-12 LAB — GLUCOSE, CAPILLARY
GLUCOSE-CAPILLARY: 139 mg/dL — AB (ref 65–99)
GLUCOSE-CAPILLARY: 109 mg/dL — AB (ref 65–99)
Glucose-Capillary: 82 mg/dL (ref 65–99)
Glucose-Capillary: 123 mg/dL — ABNORMAL HIGH (ref 65–99)

## 2017-04-12 LAB — MRSA PCR SCREENING: MRSA by PCR: NEGATIVE

## 2017-04-12 LAB — ECHOCARDIOGRAM COMPLETE
Height: 60 in
Weight: 2596.14 oz

## 2017-04-12 MED ORDER — FUROSEMIDE 10 MG/ML IJ SOLN
80.0000 mg | Freq: Two times a day (BID) | INTRAMUSCULAR | Status: DC
  Administered 2017-04-12 – 2017-04-13 (×2): 80 mg via INTRAVENOUS
  Filled 2017-04-12 (×2): qty 8

## 2017-04-12 MED ORDER — PERFLUTREN LIPID MICROSPHERE
1.0000 mL | INTRAVENOUS | Status: AC | PRN
  Administered 2017-04-12: 2 mL via INTRAVENOUS
  Filled 2017-04-12: qty 10

## 2017-04-12 NOTE — Consult Note (Signed)
   [facility] CM Inpatient Consult   04/12/2017  Allison Dunn 1931/10/12 [id number]   Patient screened for potential [place] Management services. Patient is in the [place] of the [place] Management services listed under patient's [employer] plan. However, the patient is a long term resident at [facility].  No [facility] Care Management needs assessed at this time.  Patient has 24 hours care.  For questions contact:   Natividad Brood, RN BSN [facility] Liaison  [phone] business mobile phone Toll free office [phone]

## 2017-04-12 NOTE — Progress Notes (Signed)
Pt initially not able to void, bladder scan showed 424ml. Dr. Emilio Aspen notified and ordered In and Out cath but patient was able to void 350cc on her own.

## 2017-04-12 NOTE — Care Management Note (Signed)
Case Management Note  Patient Details  Name: Allison Dunn MRN: [id number] Date of Birth: Mar 23, 1931  Subjective/Objective:  CHF                 Action/Plan: Patient is a long-term resident at [facility]. Judson Roch SW following to transition back to facility when medically stable. Expected Discharge Date:   possibly 04/16/2017               Expected Discharge Plan:  [place]  Discharge planning Services  CM Consult  Status of Service:  In process, will continue to follow  Sherrilyn Rist [phone] 04/12/2017, 3:09 PM

## 2017-04-12 NOTE — Progress Notes (Signed)
  Echocardiogram 2D Echocardiogram has been performed.  Merrie Roof F 04/12/2017, 12:40 PM

## 2017-04-12 NOTE — Clinical Social Work Note (Signed)
Patient is a long-term resident at [facility]. Patient is not fully oriented and no supports at bedside. CSW left voicemail for patient's sister. Will complete assessment when she calls back.  Allison Dunn, [place]

## 2017-04-12 NOTE — Progress Notes (Signed)
Progress Note  Patient Name: Allison Dunn Date of Encounter: 04/12/2017  Primary Cardiologist:   Cristopher Peru, MD   Subjective   The patient is weak and frail.  No acute SOB.  No pain.   Inpatient Medications    Scheduled Meds: . allopurinol  300 mg Oral Daily  . amiodarone  200 mg Oral Daily  . apixaban  2.5 mg Oral BID  . atorvastatin  80 mg Oral Q supper  . carvedilol  3.125 mg Oral BID WC  . donepezil  10 mg Oral QHS  . furosemide  40 mg Intravenous BID  . hydrALAZINE  50 mg Oral Q8H  . insulin aspart  5 Units Subcutaneous TID AC  . insulin glargine  10 Units Subcutaneous QHS  . multivitamin with minerals  1 tablet Oral Q lunch  . sodium chloride flush  3 mL Intravenous Q12H   Continuous Infusions: . sodium chloride     PRN Meds: sodium chloride, acetaminophen, meclizine, ondansetron (ZOFRAN) IV, sodium chloride flush   Vital Signs    Vitals:   04/11/17 2015 04/11/17 2038 04/12/17 0254 04/12/17 0611  BP:  (!) 132/55 (!) 105/53 132/60  Pulse:  62 (!) 59 64  Resp:  18 18 18   Temp:  98.3 F (36.8 C) 97.6 F (36.4 C) 98 F (36.7 C)  TempSrc:  Oral Oral Oral  SpO2:  96% 95% 100%  Weight:    162 lb 4.1 oz (73.6 kg)  Height: 5' (1.524 m)       Intake/Output Summary (Last 24 hours) at 04/12/2017 1136 Last data filed at 04/12/2017 0615 Gross per 24 hour  Intake 480 ml  Output 350 ml  Net 130 ml   Filed Weights   04/11/17 1700 04/12/17 0611  Weight: 162 lb 7.7 oz (73.7 kg) 162 lb 4.1 oz (73.6 kg)    Telemetry    See below.  - Personally Reviewed  ECG    NA - Personally Reviewed  Physical Exam   GEN: No acute distress.   Very frail appearing Neck: Positive JVD Cardiac: Irregular RR, no murmurs, rubs, or gallops.  Respiratory:    Decreased breath sounds GI: Soft, nontender, non-distended  MS:   Moderate to severe diffuse edema; No deformity. Neuro:  Nonfocal  Psych: Normal affect   Labs    Chemistry Recent Labs  Lab 04/11/17 1841  04/12/17 0523  NA 134* 136  K 4.5 4.4  CL 102 103  CO2 21* 22  GLUCOSE 125* 78  BUN 59* 60*  CREATININE 2.38* 2.25*  CALCIUM 9.1 8.9  PROT 6.9  --   ALBUMIN 2.6*  --   AST 44*  --   ALT 34  --   ALKPHOS 151*  --   BILITOT 0.5  --   GFRNONAA 18* 19*  GFRAA 21* 22*  ANIONGAP 11 11     Hematology Recent Labs  Lab 04/11/17 1841  WBC 8.5  RBC 3.66*  HGB 10.6*  HCT 31.9*  MCV 87.2  MCH 29.0  MCHC 33.2  RDW 18.1*  PLT 214    Cardiac EnzymesNo results for input(s): TROPONINI in the last 168 hours. No results for input(s): TROPIPOC in the last 168 hours.   BNP Recent Labs  Lab 04/11/17 1853  BNP 633.4*     DDimer No results for input(s): DDIMER in the last 168 hours.   Radiology    Dg Chest Port 1 View  Result Date: 04/12/2017 CLINICAL DATA:  Shortness of  breath. EXAM: PORTABLE CHEST 1 VIEW COMPARISON:  Radiographs of July 26, 2016. FINDINGS: Stable cardiomediastinal silhouette. Atherosclerosis of thoracic aorta is noted. No pneumothorax is noted. Right-sided pacemaker is unchanged in position. No significant pleural effusion is noted. Probable scarring or atelectasis is noted in the lingular segment of left upper lobe. Enlarged right perihilar density is noted most consistent with neoplasm or malignancy and probable postobstructive atelectasis or infiltrate. Bony thorax is unremarkable. IMPRESSION: Enlarged right perihilar density is noted consistent with neoplasm or malignancy and associated postobstructive atelectasis or infiltrate. Aortic Atherosclerosis (ICD10-I70.0). Electronically Signed   By: Marijo Conception, M.D.   On: 04/12/2017 08:00    Cardiac Studies   Study Conclusions  - Left ventricle: The cavity size was normal. Wall thickness was   increased in a pattern of mild LVH. Systolic function was   moderately to severely reduced. The estimated ejection fraction   was in the range of 30% to 35%. Diffuse hypokinesis. There is   akinesis of the  mid-apicalanterior and apical myocardium.   Features are consistent with a pseudonormal left ventricular   filling pattern, with concomitant abnormal relaxation and   increased filling pressure (grade 2 diastolic dysfunction).   Doppler parameters are consistent with high ventricular filling   pressure. - Mitral valve: Calcified annulus. There was mild regurgitation. - Left atrium: The atrium was mildly dilated. - Right ventricle: The cavity size was mildly dilated. - Right atrium: The atrium was mildly dilated. - Tricuspid valve: There was moderate regurgitation. - Pulmonary arteries: Systolic pressure was mildly to moderately   increased. PA peak pressure: 51 mm Hg (S).   Patient Profile     84 y.o. female with coronary artery disease status post prior angioplasty in [place], peripheral vascular disease, paroxysmal atrial fibrillation, sinus node dysfunction status post pacemaker, dilated cardiomyopathy (EF 74-16) with systolic heart failure, mitral regurgitation, diabetes, hypertension, dementia, lung mass who presented to the office with swelling and weight gain.   Assessment & Plan    ANASARCA:  EF 25%.  Repeat echo EF unchanged or slightly better.    I/O seem to be incomplete.  Weight is not changed.   I will increase the Lasix.  However, I spoke with her and her daughter about the difficulty of diuresing her with her comorbid conditions not least of which is protein calorie malnutrition.   CAD:  No ongoing angina.  No ischemia work up.    LUNG MASS:   No change in conservative management.   I do think that she is appropriate for palliative care or hospice.  I spoke with her daughter about this and she is in agreement.  I will consult palliative services.   PAF:  On amiodarone and Eliquis.    CKD IV:  Creat is elevated from her baseline in the fall (1.16).  Stable today compared to yesterday.   Follow closely.  PACEMAKER:  Atrial and ventricular pacing spikes noted but I  cannot tell from tele that there is normal function.  I will ask for device interrogation.   I do not see an EKG scanned in .   I did review the Nov pacer report and she had normal function.    For questions or updates, please contact [place] Please consult [email or web address] for contact info under Cardiology/STEMI.   Signed, Minus Breeding, MD  04/12/2017, 11:36 AM

## 2017-04-13 LAB — CUP PACEART REMOTE DEVICE CHECK
Battery Remaining Longevity: 81 mo
Battery Voltage: 3.01 V
Brady Statistic AP VS Percent: 98.68 %
Brady Statistic AS VS Percent: 0.55 %
Brady Statistic RV Percent Paced: 0.79 %
Date Time Interrogation Session: 20190212175101
Implantable Lead Implant Date: 20160405
Implantable Pulse Generator Implant Date: 20160405
Lead Channel Impedance Value: 342 Ohm
Lead Channel Impedance Value: 475 Ohm
Lead Channel Pacing Threshold Amplitude: 0.875 V
Lead Channel Pacing Threshold Amplitude: 1 V
Lead Channel Pacing Threshold Pulse Width: 0.4 ms
Lead Channel Sensing Intrinsic Amplitude: 1 mV
Lead Channel Sensing Intrinsic Amplitude: 1 mV
Lead Channel Sensing Intrinsic Amplitude: 4.75 mV
Lead Channel Setting Pacing Amplitude: 2 V
Lead Channel Setting Sensing Sensitivity: 2.8 mV
MDC IDC LEAD IMPLANT DT: 20160405
MDC IDC LEAD LOCATION: 753859
MDC IDC LEAD LOCATION: 753860
MDC IDC MSMT LEADCHNL RA IMPEDANCE VALUE: 285 Ohm
MDC IDC MSMT LEADCHNL RA PACING THRESHOLD PULSEWIDTH: 0.4 ms
MDC IDC MSMT LEADCHNL RV IMPEDANCE VALUE: 342 Ohm
MDC IDC MSMT LEADCHNL RV SENSING INTR AMPL: 4.75 mV
MDC IDC SET LEADCHNL RA PACING AMPLITUDE: 2 V
MDC IDC SET LEADCHNL RV PACING PULSEWIDTH: 0.4 ms
MDC IDC STAT BRADY AP VP PERCENT: 0.73 %
MDC IDC STAT BRADY AS VP PERCENT: 0.04 %
MDC IDC STAT BRADY RA PERCENT PACED: 99.4 %

## 2017-04-13 LAB — BASIC METABOLIC PANEL
ANION GAP: 12 (ref 5–15)
BUN: 60 mg/dL — AB (ref 6–20)
CO2: 22 mmol/L (ref 22–32)
Calcium: 8.8 mg/dL — ABNORMAL LOW (ref 8.9–10.3)
Chloride: 102 mmol/L (ref 101–111)
Creatinine, Ser: 2.28 mg/dL — ABNORMAL HIGH (ref 0.44–1.00)
GFR calc Af Amer: 22 mL/min — ABNORMAL LOW (ref >60)
GFR, EST NON AFRICAN AMERICAN: 19 mL/min — AB (ref >60)
Glucose, Bld: 62 mg/dL — ABNORMAL LOW (ref 65–99)
POTASSIUM: 4 mmol/L (ref 3.5–5.1)
SODIUM: 136 mmol/L (ref 135–145)

## 2017-04-13 LAB — GLUCOSE, CAPILLARY
Glucose-Capillary: 52 mg/dL — ABNORMAL LOW (ref 65–99)
Glucose-Capillary: 72 mg/dL (ref 65–99)
Glucose-Capillary: 108 mg/dL — ABNORMAL HIGH (ref 65–99)
Glucose-Capillary: 133 mg/dL — ABNORMAL HIGH (ref 65–99)
Glucose-Capillary: 108 mg/dL — ABNORMAL HIGH (ref 65–99)

## 2017-04-13 MED ORDER — FUROSEMIDE 10 MG/ML IJ SOLN
80.0000 mg | Freq: Three times a day (TID) | INTRAMUSCULAR | Status: DC
  Administered 2017-04-13 – 2017-04-15 (×5): 80 mg via INTRAVENOUS
  Filled 2017-04-13 (×7): qty 8

## 2017-04-13 NOTE — Clinical Social Work Note (Signed)
Clinical Social Work Assessment  Patient Details  Name: Allison Dunn MRN: [id number] Date of Birth: 1931/02/21  Date of referral:  04/13/17               Reason for consult:  Discharge Planning                Permission sought to share information with:  Facility Sport and exercise psychologist, Family Supports Permission granted to share information::  Yes, Verbal Permission Granted  Name::     [place]::  [facility]  Relationship::  Sister  Contact Information:  [phone]  Housing/Transportation Living arrangements for the past 2 months:  [place] of Information:  Patient, Medical Team, Siblings Patient Interpreter Needed:  None Criminal Activity/Legal Involvement Pertinent to Current Situation/Hospitalization:  No - Comment as needed Significant Relationships:  Siblings Lives with:  Facility Resident Do you feel safe going back to the place where you live?  Yes Need for family participation in patient care:  Yes (Comment)  Care giving concerns:  Patient is a long-term resident at [facility]   Social Worker assessment / plan:  CSW met with patient. Sister at bedside. CSW introduced role and explained that discharge planning would be discussed. Patient and her sister confirmed she is a long-term resident at [facility]. Patient has been there a little over a year and the plan is for her to return at discharge. No further concerns. CSW encouraged patient and her sister to contact CSW as needed. CSW will continue to follow patient and her sister for support and facilitate discharge back to SNF once medically stable.  Employment status:  Retired Nurse, adult PT Recommendations:  Not assessed at this time Information / Referral to community resources:  [place]  Patient/Family's Response to care:  Patient and her sister agreeable to return to SNF. Patient's sister supportive and involved in  patient's care. Patient and her sister appreciated social work intervention.  Patient/Family's Understanding of and Emotional Response to Diagnosis, Current Treatment, and Prognosis:  Patient only oriented to self. Patient's sister has a good understanding of the reason for admission and plan to return to SNF at discharge. Patient and her sister appear happy with hospital care.  Emotional Assessment Appearance:  Appears stated age Attitude/Demeanor/Rapport:  Gracious Affect (typically observed):  Accepting, Appropriate, Calm, Pleasant Orientation:  Oriented to Self Alcohol / Substance use:  Never Used Psych involvement (Current and /or in the community):  No (Comment)  Discharge Needs  Concerns to be addressed:  Care Coordination Readmission within the last 30 days:  No Current discharge risk:  None Barriers to Discharge:  Continued Medical Work up   Candie Chroman, LCSW 04/13/2017, 3:01 PM

## 2017-04-13 NOTE — NC FL2 (Signed)
[place] LEVEL OF CARE SCREENING TOOL     IDENTIFICATION  Patient Name: Allison Dunn Birthdate: 11-20-1931 Sex: female Admission Date (Current Location): 04/11/2017  [facility] and [place] Number:  Herbalist and Address:  The . [facility], [place] [address] [place], [place] [address]      Provider Number: [id number]  Attending Physician Name and Address:  Burnell Blanks,*  Relative Name and Phone Number:       Current Level of Care: Hospital Recommended Level of Care: [place] Prior Approval Number:    Date Approved/Denied:   PASRR Number: [id number]  Discharge Plan: SNF    Current Diagnoses: Patient Active Problem List   Diagnosis Date Noted  . Sinus node dysfunction ([place]) 02/18/2017  . Acute on chronic systolic heart failure ([place]) 10/31/2016  . CAD (coronary artery disease) 10/31/2016  . Vitamin B12 deficiency 07/30/2016  . H/O colon cancer, stage I 07/03/2016  . Lung mass 04/21/2016  . Degenerative cervical spinal stenosis 03/01/2016  . Spinal cord compression due to degenerative disorder of spinal column ([place]) 03/01/2016  . Hypokalemia 03/01/2016  . Myelopathy ([place])   . Generalized weakness 02/19/2016  . Diaphragmatic injury as surgical complication 50/35/4656  . Postoperative anemia due to acute blood loss 08/14/2015  . Dementia without behavioral disturbance 08/14/2015  . Chronic UTI 07/29/2015  . 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 Klebsiella sepsis ([place]) 05/02/2015  . CAP (community acquired pneumonia) 05/02/2015  . Transaminasemia   . Sepsis ([place]) 04/22/2015  . Paroxysmal atrial fibrillation ([place]) 04/22/2015  . Delirium   . Urinary tract infectious disease   . Osteoarthritis 03/27/2015  . Cerebrovascular accident (CVA) due to thrombosis of cerebral artery ([place]) 03/23/2015  . Moderate tricuspid regurgitation 03/11/2015  . Dilated cardiomyopathy ([place]) 03/11/2015  .  Mitral regurgitation 03/11/2015  . Pulmonary arterial hypertension ([place]), mild 03/11/2015  . Mild cognitive impairment 02/24/2015  . Type 2 diabetes mellitus without complication, with long-term current use of insulin ([place]) 02/24/2015  . Hypertensive heart disease with CHF (congestive heart failure) ([place]) 02/24/2015  . Diabetes ([place]) 02/21/2015  . Hyperlipidemia 02/19/2015  . Gout 02/19/2015  . PAD (peripheral artery disease) ([place]) 02/19/2015  . Cardiac pacemaker in situ 02/19/2015  . CKD (chronic kidney disease) 02/19/2015    Orientation RESPIRATION BLADDER Height & Weight     Self  Normal Continent, External catheter Weight: 158 lb 4.6 oz (71.8 kg) Height:  5' (152.4 cm)  BEHAVIORAL SYMPTOMS/MOOD NEUROLOGICAL BOWEL NUTRITION STATUS  (None) (Dementia) Continent Diet(Heart healthy/carb modified)  AMBULATORY STATUS COMMUNICATION OF NEEDS Skin     Verbally Normal                       Personal Care Assistance Level of Assistance              Functional Limitations Info  Sight, Hearing, Speech Sight Info: Adequate Hearing Info: Adequate Speech Info: Adequate    SPECIAL CARE FACTORS FREQUENCY                       Contractures Contractures Info: Not present    Additional Factors Info  Code Status, Allergies Code Status Info: Full code Allergies Info: NKDA           Current Medications (04/13/2017):  This is the current hospital active medication list Current Facility-Administered Medications  Medication Dose Route Frequency Provider Last Rate Last Dose  . 0.9 %  sodium chloride infusion  250 mL Intravenous PRN Richardson Dopp T, PA-C      . acetaminophen (TYLENOL) tablet 650 mg  650 mg Oral Q6H PRN Richardson Dopp T, PA-C   650 mg at 04/13/17 8638  . allopurinol (ZYLOPRIM) tablet 300 mg  300 mg Oral Daily Richardson Dopp T, PA-C   300 mg at 04/13/17 1771  . amiodarone (PACERONE) tablet 200 mg  200 mg Oral Daily Richardson Dopp T, PA-C   200 mg at 04/13/17 1657   . apixaban (ELIQUIS) tablet 2.5 mg  2.5 mg Oral BID Richardson Dopp T, PA-C   2.5 mg at 04/13/17 9038  . atorvastatin (LIPITOR) tablet 80 mg  80 mg Oral Q supper Richardson Dopp T, PA-C   80 mg at 04/12/17 1655  . carvedilol (COREG) tablet 3.125 mg  3.125 mg Oral BID WC Weaver, Scott T, PA-C   3.125 mg at 04/13/17 3338  . donepezil (ARICEPT) tablet 10 mg  10 mg Oral QHS Weaver, Scott T, PA-C   10 mg at 04/12/17 2130  . furosemide (LASIX) injection 80 mg  80 mg Intravenous TID Minus Breeding, MD      . hydrALAZINE (APRESOLINE) tablet 50 mg  50 mg Oral Q8H Weaver, Scott T, PA-C   50 mg at 04/13/17 3291  . insulin aspart (novoLOG) injection 5 Units  5 Units Subcutaneous TID AC Burnell Blanks, MD   5 Units at 04/13/17 1128  . insulin glargine (LANTUS) injection 10 Units  10 Units Subcutaneous QHS Richardson Dopp T, [place]   10 Units at 04/12/17 2129  . meclizine (ANTIVERT) tablet 25 mg  25 mg Oral TID PRN Richardson Dopp T, PA-C      . multivitamin with minerals tablet 1 tablet  1 tablet Oral Q lunch Richardson Dopp T, PA-C   1 tablet at 04/13/17 1128  . ondansetron (ZOFRAN) injection 4 mg  4 mg Intravenous Q6H PRN Richardson Dopp T, PA-C      . sodium chloride flush (NS) 0.9 % injection 3 mL  3 mL Intravenous Q12H Weaver, Scott T, PA-C   3 mL at 04/12/17 2130  . sodium chloride flush (NS) 0.9 % injection 3 mL  3 mL Intravenous PRN Richardson Dopp T, PA-C         Discharge Medications: Please see discharge summary for a list of discharge medications.  Relevant Imaging Results:  Relevant Lab Results:   Additional Information SS#: [id number]  Candie Chroman, LCSW

## 2017-04-13 NOTE — Progress Notes (Signed)
Progress Note  Patient Name: Allison Dunn Date of Encounter: 04/13/2017  Primary Cardiologist:   Cristopher Peru, MD   Subjective   She denies any SOB.  No pain.    Inpatient Medications    Scheduled Meds: . allopurinol  300 mg Oral Daily  . amiodarone  200 mg Oral Daily  . apixaban  2.5 mg Oral BID  . atorvastatin  80 mg Oral Q supper  . carvedilol  3.125 mg Oral BID WC  . donepezil  10 mg Oral QHS  . furosemide  80 mg Intravenous BID  . hydrALAZINE  50 mg Oral Q8H  . insulin aspart  5 Units Subcutaneous TID AC  . insulin glargine  10 Units Subcutaneous QHS  . multivitamin with minerals  1 tablet Oral Q lunch  . sodium chloride flush  3 mL Intravenous Q12H   Continuous Infusions: . sodium chloride     PRN Meds: sodium chloride, acetaminophen, meclizine, ondansetron (ZOFRAN) IV, sodium chloride flush   Vital Signs    Vitals:   04/12/17 1400 04/12/17 2114 04/13/17 0457 04/13/17 0936  BP: (!) 118/46 101/63 (!) 111/55 (!) 94/41  Pulse: (!) 59 62 60 (!) 53  Resp: 12 16 16 15   Temp: (!) 97.3 F (36.3 C) (!) 97.4 F (36.3 C) (!) 97.3 F (36.3 C) (!) 97.4 F (36.3 C)  TempSrc: Oral Oral Oral Oral  SpO2: 99% 98% 99% 98%  Weight:   158 lb 4.6 oz (71.8 kg)   Height:        Intake/Output Summary (Last 24 hours) at 04/13/2017 1023 Last data filed at 04/13/2017 0901 Gross per 24 hour  Intake 420 ml  Output 800 ml  Net -380 ml   Filed Weights   04/11/17 1700 04/12/17 0611 04/13/17 0457  Weight: 162 lb 7.7 oz (73.7 kg) 162 lb 4.1 oz (73.6 kg) 158 lb 4.6 oz (71.8 kg)    Telemetry    NSR, atrial pacing.  - Personally Reviewed  ECG    NA- Personally Reviewed  Physical Exam   GEN: No  acute distress.   Neck: No  JVD Cardiac: RRR, no murmurs, rubs, or gallops.  Respiratory: Clear  to auscultation bilaterally. GI: Soft, nontender, non-distended, normal bowel sounds  MS:  Moderate/severe edema; No deformity. Neuro:   Nonfocal  Psych: Oriented and  appropriate    Labs    Chemistry Recent Labs  Lab 04/11/17 1841 04/12/17 0523 04/13/17 0545  NA 134* 136 136  K 4.5 4.4 4.0  CL 102 103 102  CO2 21* 22 22  GLUCOSE 125* 78 62*  BUN 59* 60* 60*  CREATININE 2.38* 2.25* 2.28*  CALCIUM 9.1 8.9 8.8*  PROT 6.9  --   --   ALBUMIN 2.6*  --   --   AST 44*  --   --   ALT 34  --   --   ALKPHOS 151*  --   --   BILITOT 0.5  --   --   GFRNONAA 18* 19* 19*  GFRAA 21* 22* 22*  ANIONGAP 11 11 12      Hematology Recent Labs  Lab 04/11/17 1841  WBC 8.5  RBC 3.66*  HGB 10.6*  HCT 31.9*  MCV 87.2  MCH 29.0  MCHC 33.2  RDW 18.1*  PLT 214    Cardiac EnzymesNo results for input(s): TROPONINI in the last 168 hours. No results for input(s): TROPIPOC in the last 168 hours.   BNP Recent Labs  Lab  04/11/17 1853  BNP 633.4*     DDimer No results for input(s): DDIMER in the last 168 hours.   Radiology    Dg Chest Port 1 View  Result Date: 04/12/2017 CLINICAL DATA:  Shortness of breath. EXAM: PORTABLE CHEST 1 VIEW COMPARISON:  Radiographs of July 26, 2016. FINDINGS: Stable cardiomediastinal silhouette. Atherosclerosis of thoracic aorta is noted. No pneumothorax is noted. Right-sided pacemaker is unchanged in position. No significant pleural effusion is noted. Probable scarring or atelectasis is noted in the lingular segment of left upper lobe. Enlarged right perihilar density is noted most consistent with neoplasm or malignancy and probable postobstructive atelectasis or infiltrate. Bony thorax is unremarkable. IMPRESSION: Enlarged right perihilar density is noted consistent with neoplasm or malignancy and associated postobstructive atelectasis or infiltrate. Aortic Atherosclerosis (ICD10-I70.0). Electronically Signed   By: Marijo Conception, M.D.   On: 04/12/2017 08:00    Cardiac Studies   Study Conclusions  - Left ventricle: The cavity size was normal. Wall thickness was   increased in a pattern of mild LVH. Systolic function was    moderately to severely reduced. The estimated ejection fraction   was in the range of 30% to 35%. Diffuse hypokinesis. There is   akinesis of the mid-apicalanterior and apical myocardium.   Features are consistent with a pseudonormal left ventricular   filling pattern, with concomitant abnormal relaxation and   increased filling pressure (grade 2 diastolic dysfunction).   Doppler parameters are consistent with high ventricular filling   pressure. - Mitral valve: Calcified annulus. There was mild regurgitation. - Left atrium: The atrium was mildly dilated. - Right ventricle: The cavity size was mildly dilated. - Right atrium: The atrium was mildly dilated. - Tricuspid valve: There was moderate regurgitation. - Pulmonary arteries: Systolic pressure was mildly to moderately   increased. PA peak pressure: 51 mm Hg (S).   Patient Profile     84 y.o. female with coronary artery disease status post prior angioplasty in [place], peripheral vascular disease, paroxysmal atrial fibrillation, sinus node dysfunction status post pacemaker, dilated cardiomyopathy (EF 46-80) with systolic heart failure, mitral regurgitation, diabetes, hypertension, dementia, lung mass who presented to the office with swelling and weight gain.   Assessment & Plan    ANASARCA:  EF 25%.  Repeat echo EF unchanged or slightly better.   Suspect that I/O incomplete.  However, I will increase the diuretic today.    CAD:   No acute ischemia suspected.   LUNG MASS:   Palliative care consult requested.   PAF:  Continue amiodarone and Eliquis.    CKD IV:  Creat is elevated from baseline but essentially unchanged from yesterday.  Follow on increased Lasix.    PACEMAKER:  NL pacemaker function.  Device interrogated and P wave sensing changed.  For questions or updates, please contact [place] Please consult [email or web address] for contact info under Cardiology/STEMI.   Signed, Minus Breeding, MD  04/13/2017, 10:23 AM

## 2017-04-13 NOTE — Progress Notes (Signed)
Results for Allison Dunn, Allison Dunn (MRN [id number]) as of 04/13/2017 10:26  Ref. Range 04/12/2017 11:18 04/12/2017 16:46 04/12/2017 21:28 04/13/2017 07:24 04/13/2017 07:58  Glucose-Capillary Latest Ref Range: 65 - 99 mg/dL 139 (H) 123 (H) 109 (H) 52 (L) 72  Noted that blood sugars have been less than 70 mg/dl. Recommend changing the Novolog 5 units TID order to a Novolog correction scale SENSITIVE TID.Discontinue the Novolog 5 units TID. Continue to watch blood sugars and adjust insulin orders as needed.   Harvel Ricks RN BSN CDE Diabetes Coordinator Pager: [phone]  8am-5pm

## 2017-04-13 NOTE — Plan of Care (Signed)
  Education: Knowledge of General Education information will improve 04/13/2017 0223 - Progressing by Theador Hawthorne, RN   Clinical Measurements: Will remain free from infection 04/13/2017 0223 - Progressing by Theador Hawthorne, RN   Clinical Measurements: Diagnostic test results will improve 04/13/2017 0223 - Progressing by Theador Hawthorne, RN   Nutrition: Adequate nutrition will be maintained 04/13/2017 0223 - Progressing by Theador Hawthorne, RN   Coping: Level of anxiety will decrease 04/13/2017 0223 - Progressing by Theador Hawthorne, RN   Safety: Ability to remain free from injury will improve 04/13/2017 0223 - Progressing by Theador Hawthorne, RN

## 2017-04-14 ENCOUNTER — Encounter: Payer: Self-pay | Admitting: Internal Medicine

## 2017-04-14 DIAGNOSIS — I480 Paroxysmal atrial fibrillation: Secondary | ICD-10-CM

## 2017-04-14 DIAGNOSIS — Z950 Presence of cardiac pacemaker: Secondary | ICD-10-CM

## 2017-04-14 DIAGNOSIS — Z515 Encounter for palliative care: Secondary | ICD-10-CM

## 2017-04-14 DIAGNOSIS — R918 Other nonspecific abnormal finding of lung field: Secondary | ICD-10-CM

## 2017-04-14 DIAGNOSIS — Z7189 Other specified counseling: Secondary | ICD-10-CM

## 2017-04-14 DIAGNOSIS — I25118 Atherosclerotic heart disease of native coronary artery with other forms of angina pectoris: Secondary | ICD-10-CM

## 2017-04-14 DIAGNOSIS — N184 Chronic kidney disease, stage 4 (severe): Secondary | ICD-10-CM

## 2017-04-14 LAB — GLUCOSE, CAPILLARY
GLUCOSE-CAPILLARY: 139 mg/dL — AB (ref 65–99)
GLUCOSE-CAPILLARY: 116 mg/dL — AB (ref 65–99)
Glucose-Capillary: 125 mg/dL — ABNORMAL HIGH (ref 65–99)
Glucose-Capillary: 127 mg/dL — ABNORMAL HIGH (ref 65–99)
Glucose-Capillary: 75 mg/dL (ref 65–99)

## 2017-04-14 LAB — BASIC METABOLIC PANEL
Anion gap: 9 (ref 5–15)
BUN: 63 mg/dL — ABNORMAL HIGH (ref 6–20)
CHLORIDE: 100 mmol/L — AB (ref 101–111)
CO2: 25 mmol/L (ref 22–32)
CREATININE: 2.24 mg/dL — AB (ref 0.44–1.00)
Calcium: 8.9 mg/dL (ref 8.9–10.3)
GFR calc Af Amer: 22 mL/min — ABNORMAL LOW (ref >60)
GFR calc non Af Amer: 19 mL/min — ABNORMAL LOW (ref >60)
Glucose, Bld: 91 mg/dL (ref 65–99)
Potassium: 4.1 mmol/L (ref 3.5–5.1)
Sodium: 134 mmol/L — ABNORMAL LOW (ref 135–145)

## 2017-04-14 NOTE — Progress Notes (Signed)
Location:  [facility] of Service:  SNF (31)  Hennie Duos, MD  Patient Care Team: Hennie Duos, MD as PCP - General (Internal Medicine) Evans Lance, MD as PCP - Cardiology (Cardiology) Jola Schmidt, MD as Consulting Physician (Ophthalmology) Cameron Sprang, MD as Consulting Physician (Neurology) Juanito Doom, MD as Consulting Physician (Pulmonary Disease)  Extended Emergency Contact Information Primary Emergency Contact: [name],[name] Address: [address]          [place], [place] [address] [name] of [place] Phone: [phone] Mobile Phone: [phone] Relation: Sister    Allergies: Patient has no known allergies.  Chief Complaint  Patient presents with  . Acute Visit    HPI: Patient is 84 y.o. female who nursing asked me to see for increased upper extremity edema. Patient has been followed for several days. Initially her systolic blood pressures were running in the 140s to 160s and her Benzapril was increased to 40 mg a day and hydralazine was increased to 50 mg 3 times a day. Patient's blood pressure remained elevated according to a call from the nurse and patient was having some swelling of her lips so patient's benazepril was DC'd for angioedema and her hydralazine was increased to 100 mg 3 times a day and Lasix to 80 mg daily. These medications however were not given because this morning patient's systolic blood pressures were below 100. Patient denies any shortness of breath or chest pain or extremity pain. Patient does admit that she had had some feeling of fullness in her throat at the same time as her lips were swelling but that is now improved. Patient is in bed this morning but was up and out of bed yesterday in the hall as per usual.  Past Medical History:  Diagnosis Date  . Arthritis   . CAD (coronary artery disease) 10/31/2016   History of angioplasty while in [place]  //  Nuclear stress test 03/03/14  ([place]): Anteroseptal and apical infarct, no ischemia, EF 46  . Cancer [facility])    breast, left; s/p lumpectomy/chemo/radiation  . CAP (community acquired pneumonia) 05/02/2015  . Cardiac pacemaker in situ 02/19/2015  . Chronic systolic CHF (congestive heart failure) (HCC)    Echo 1/16: EF 50, mild MR, mild TR //Echo 1/17: EF 40-45, apical AK, grade 2 diastolic dysfunction, mild MR, mild LAE, moderate TR, PASP 44 //Echo 1/18: Moderate LVH, EF 20-25, anterior AK, moderate to severe MR, moderate LAE, PASP 34  . CKD (chronic kidney disease) 02/19/2015  . Degenerative cervical spinal stenosis 03/01/2016  . Dementia without behavioral disturbance 08/14/2015  . Depression   . Diabetes mellitus without complication ([place])   . Gout 02/19/2015  . H/O colon cancer, stage I 07/03/2016   s/p resection 2013 - colonic mucosa with high-grade dysplasia/adenocarcinoma in situ  . History of stroke 03/23/2015  . Hyperlipidemia   . Hypertensive heart disease with CHF (congestive heart failure) ([place]) 02/24/2015  . Lung mass 04/21/2016  . Mild cognitive impairment 02/24/2015  . Mild mitral regurgitation 03/11/2015  . Moderate tricuspid regurgitation 03/11/2015   02/2015   . Osteoarthritis 03/27/2015  . PAD (peripheral artery disease) ([place]) 02/19/2015   S/p stent in left leg // carotid US 12/17: Bilateral ICA 1-39  . Paroxysmal atrial fibrillation (HCC)   . Pulmonary arterial hypertension ([place]), mild 03/11/2015  . Spinal cord compression due to degenerative disorder of spinal column ([place]) 03/01/2016  . Transaminasemia   . Type 2  diabetes mellitus without complication, with long-term current use of insulin ([place]) 02/24/2015  . Vitamin B12 deficiency 07/30/2016  . 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 BREAST LUMPECTOMY Left   . CARDIAC PACEMAKER PLACEMENT    . CATARACT EXTRACTION    . EYE SURGERY    . kidney stone removal     x3  . LAPAROSCOPIC NEPHRECTOMY  Left 07/29/2015   Procedure: LEFT LAPAROSCOPIC SIMPLE NEPHRECTOMY;  Surgeon: Ardis Hughs, MD;  Location: [facility] ORS;  Service: Urology;  Laterality: Left;  . LUMBAR SPINE SURGERY     laminectomy and rod    Allergies as of 04/11/2017   No Known Allergies     Medication List    Notice   This visit is during an admission. Changes to the med list made in this visit will be reflected in the After Visit Summary of the admission.    Current Facility-Administered Medications on File Prior to Visit  Medication Dose Route Frequency Provider Last Rate Last Dose  . 0.9 %  sodium chloride infusion  250 mL Intravenous PRN Richardson Dopp T, PA-C      . acetaminophen (TYLENOL) tablet 650 mg  650 mg Oral Q6H PRN Richardson Dopp T, PA-C   650 mg at 04/13/17 7371  . allopurinol (ZYLOPRIM) tablet 300 mg  300 mg Oral Daily Richardson Dopp T, PA-C   300 mg at 04/14/17 0947  . amiodarone (PACERONE) tablet 200 mg  200 mg Oral Daily Richardson Dopp T, PA-C   200 mg at 04/14/17 0947  . apixaban (ELIQUIS) tablet 2.5 mg  2.5 mg Oral BID Richardson Dopp T, PA-C   2.5 mg at 04/14/17 0947  . atorvastatin (LIPITOR) tablet 80 mg  80 mg Oral Q supper Richardson Dopp T, PA-C   80 mg at 04/13/17 1733  . carvedilol (COREG) tablet 3.125 mg  3.125 mg Oral BID WC Weaver, Scott T, PA-C   3.125 mg at 04/14/17 0626  . donepezil (ARICEPT) tablet 10 mg  10 mg Oral QHS Weaver, Scott T, PA-C   10 mg at 04/13/17 2359  . furosemide (LASIX) injection 80 mg  80 mg Intravenous TID Minus Breeding, MD   80 mg at 04/14/17 0947  . hydrALAZINE (APRESOLINE) tablet 50 mg  50 mg Oral Q8H Weaver, Scott T, PA-C   50 mg at 04/14/17 9485  . insulin aspart (novoLOG) injection 5 Units  5 Units Subcutaneous TID AC Burnell Blanks, MD   5 Units at 04/14/17 1214  . insulin glargine (LANTUS) injection 10 Units  10 Units Subcutaneous QHS Richardson Dopp T, PA-C   10 Units at 04/13/17 2358  . meclizine (ANTIVERT) tablet 25 mg  25 mg Oral TID PRN Richardson Dopp  T, PA-C      . multivitamin with minerals tablet 1 tablet  1 tablet Oral Q lunch Richardson Dopp T, PA-C   1 tablet at 04/14/17 1215  . ondansetron (ZOFRAN) injection 4 mg  4 mg Intravenous Q6H PRN Richardson Dopp T, PA-C      . sodium chloride flush (NS) 0.9 % injection 3 mL  3 mL Intravenous Q12H Weaver, Scott T, PA-C   3 mL at 04/14/17 0948  . sodium chloride flush (NS) 0.9 % injection 3 mL  3 mL Intravenous PRN Liliane Shi, PA-C       Current Outpatient Medications on File Prior to Visit  Medication Sig Dispense Refill  . acetaminophen (TYLENOL) 325 MG  tablet Take 650 mg by mouth every 6 (six) hours as needed.    Marland Kitchen allopurinol (ZYLOPRIM) 300 MG tablet TAKE ONE TABLET BY MOUTH ONCE DAILY (Patient taking differently: TAKE ONE TABLET (300mg ) BY MOUTH ONCE DAILY) 90 tablet 0  . amiodarone (PACERONE) 200 MG tablet Take 1 tablet (200 mg total) by mouth daily. 90 tablet 1  . apixaban (ELIQUIS) 2.5 MG TABS tablet Take 2.5 mg by mouth 2 (two) times daily.    Marland Kitchen atorvastatin (LIPITOR) 80 MG tablet Take 1 tablet (80 mg total) by mouth daily with supper. 90 tablet 1  . carvedilol (COREG) 3.125 MG tablet Take 1 tablet (3.125 mg total) by mouth 2 (two) times daily with a meal. 60 tablet 11  . donepezil (ARICEPT) 10 MG tablet Take 1 tablet (10 mg total) by mouth at bedtime. 90 tablet 3  . furosemide (LASIX) 80 MG tablet Take 80 mg by mouth daily.    Marland Kitchen HUMALOG KWIKPEN 100 UNIT/ML KiwkPen Inject 5 Units into the skin 3 (three) times daily before meals. When CBG is greater than 200; inject 8 units with a CBG greater than 300    . hydrALAZINE (APRESOLINE) 100 MG tablet Take 100 mg by mouth 3 (three) times daily.    . Insulin Glargine (TOUJEO SOLOSTAR) 300 UNIT/ML SOPN Inject 10 Units into the skin at bedtime.     . meclizine (ANTIVERT) 25 MG tablet Take 25 mg by mouth 3 (three) times daily as needed for dizziness.    . Multiple Vitamins-Minerals (WOMENS MULTI VITAMIN & MINERAL PO) Take 1 tablet by mouth daily  with lunch.        No orders of the defined types were placed in this encounter.   Immunization History  Administered Date(s) Administered  . Influenza, High Dose Seasonal PF 12/30/2015  . Influenza-Unspecified 12/01/2016  . PPD Test 04/26/2015, 08/02/2015, 02/23/2016  . Pneumococcal Conjugate-13 02/03/2017  . Tdap 02/27/2017    Social History   Tobacco Use  . Smoking status: Passive Smoke Exposure - Never Smoker  . Smokeless tobacco: Never Used  . Tobacco comment: pt's spouse was heavy smoker, smoked in home.   Substance Use Topics  . Alcohol use: No    Alcohol/week: 0.0 oz    Review of Systems  DATA OBTAINED: from patient, nurse GENERAL:  no fevers, fatigue, appetite changes SKIN: No itching, rash HEENT: No complaint RESPIRATORY: No cough, wheezing, SOB CARDIAC: No chest pain, palpitations,+ lower extremity edema less than upper extremity edema GI: No abdominal pain, No N/V/D or constipation, No heartburn or reflux  GU: No dysuria, frequency or urgency, or incontinence  MUSCULOSKELETAL: No unrelieved bone/joint pain NEUROLOGIC: No headache, dizziness  PSYCHIATRIC: No overt anxiety or sadness  Vitals:   04/14/17 1942  BP: (!) 98/56  Pulse: 61  Resp: 18  Temp: 98 F (36.7 C)   There is no height or weight on file to calculate BMI. Physical Exam  GENERAL APPEARANCE: Alert, conversant, No acute distress  SKIN: No diaphoresis rash HEENT: Unremarkable RESPIRATORY: Breathing is even, unlabored. Lung sounds are clear   CARDIOVASCULAR: Heart RRR no murmurs, rubs or gallops. Right facial edema, bilateral upper extremity edema left much greater than right, truncal edema left much greater than right and bilateral lower extremity edema GASTROINTESTINAL: Abdomen is soft, non-tender, not distended w/ normal bowel sounds.  GENITOURINARY: Bladder non tender, not distended  MUSCULOSKELETAL: No abnormal joints or musculature NEUROLOGIC: Cranial nerves 2-12 grossly intact.  Moves all extremities PSYCHIATRIC: Mood and affect appropriate  to situation, no behavioral issues  Patient Active Problem List   Diagnosis Date Noted  . Goals of care, counseling/discussion   . Palliative care by specialist   . Sinus node dysfunction ([place]) 02/18/2017  . Acute on chronic systolic heart failure ([place]) 10/31/2016  . CAD (coronary artery disease) 10/31/2016  . Vitamin B12 deficiency 07/30/2016  . H/O colon cancer, stage I 07/03/2016  . Lung mass 04/21/2016  . Degenerative cervical spinal stenosis 03/01/2016  . Spinal cord compression due to degenerative disorder of spinal column ([place]) 03/01/2016  . Hypokalemia 03/01/2016  . Myelopathy ([place])   . Generalized weakness 02/19/2016  . Diaphragmatic injury as surgical complication 09/60/4540  . Postoperative anemia due to acute blood loss 08/14/2015  . Dementia without behavioral disturbance 08/14/2015  . Chronic UTI 07/29/2015  . 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 Klebsiella sepsis ([place]) 05/02/2015  . CAP (community acquired pneumonia) 05/02/2015  . Transaminasemia   . Sepsis ([place]) 04/22/2015  . Paroxysmal atrial fibrillation ([place]) 04/22/2015  . Delirium   . Urinary tract infectious disease   . Osteoarthritis 03/27/2015  . Cerebrovascular accident (CVA) due to thrombosis of cerebral artery ([place]) 03/23/2015  . Moderate tricuspid regurgitation 03/11/2015  . Dilated cardiomyopathy ([place]) 03/11/2015  . Mitral regurgitation 03/11/2015  . Pulmonary arterial hypertension ([place]), mild 03/11/2015  . Mild cognitive impairment 02/24/2015  . Type 2 diabetes mellitus without complication, with long-term current use of insulin ([place]) 02/24/2015  . Hypertensive heart disease with CHF (congestive heart failure) ([place]) 02/24/2015  . Diabetes ([place]) 02/21/2015  . Hyperlipidemia 02/19/2015  . Gout 02/19/2015  . PAD (peripheral artery disease) ([place]) 02/19/2015  . Cardiac pacemaker in situ 02/19/2015  . CKD (chronic kidney  disease) 02/19/2015    CMP     Component Value Date/Time   NA 134 (L) 04/14/2017 0439   NA 142 11/14/2016 1255   K 4.1 04/14/2017 0439   CL 100 (L) 04/14/2017 0439   CO2 25 04/14/2017 0439   GLUCOSE 91 04/14/2017 0439   BUN 63 (H) 04/14/2017 0439   BUN 25 11/14/2016 1255   CREATININE 2.24 (H) 04/14/2017 0439   CALCIUM 8.9 04/14/2017 0439   PROT 6.9 04/11/2017 1841   PROT 7.4 10/31/2016 1529   ALBUMIN 2.6 (L) 04/11/2017 1841   ALBUMIN 3.6 10/31/2016 1529   AST 44 (H) 04/11/2017 1841   ALT 34 04/11/2017 1841   ALKPHOS 151 (H) 04/11/2017 1841   BILITOT 0.5 04/11/2017 1841   BILITOT 0.4 10/31/2016 1529   GFRNONAA 19 (L) 04/14/2017 0439   GFRAA 22 (L) 04/14/2017 0439   Recent Labs    04/12/17 0523 04/13/17 0545 04/14/17 0439  NA 136 136 134*  K 4.4 4.0 4.1  CL 103 102 100*  CO2 22 22 25   GLUCOSE 78 62* 91  BUN 60* 60* 63*  CREATININE 2.25* 2.28* 2.24*  CALCIUM 8.9 8.8* 8.9   Recent Labs    09/11/16 10/31/16 1529 04/11/17 1841  AST 20 29 44*  ALT 17 33* 34  ALKPHOS 136* 179* 151*  BILITOT  --  0.4 0.5  PROT  --  7.4 6.9  ALBUMIN  --  3.6 2.6*   Recent Labs    09/11/16 10/31/16 1529 04/11/17 1841  WBC 8.7 8.6 8.5  NEUTROABS  --   --  6.8  HGB 11.9* 12.4 10.6*  HCT 37 39.3 31.9*  MCV  --  92 87.2  PLT 193 282 214   Recent Labs    09/11/16  CHOL 203*  [place] 121  TRIG 59   No results found for: [facility] Lab Results  Component Value Date   TSH 3.618 04/11/2017   Lab Results  Component Value Date   HGBA1C 8.6 09/11/2016   Lab Results  Component Value Date   CHOL 203 (A) 09/11/2016   HDL 70 09/11/2016   LDLCALC 121 09/11/2016   TRIG 59 09/11/2016   CHOLHDL 3.0 02/20/2016    Significant Diagnostic Results in last 30 days:  Dg Chest Port 1 View  Result Date: 04/12/2017 CLINICAL DATA:  Shortness of breath. EXAM: PORTABLE CHEST 1 VIEW COMPARISON:  Radiographs of July 26, 2016. FINDINGS: Stable cardiomediastinal silhouette. Atherosclerosis  of thoracic aorta is noted. No pneumothorax is noted. Right-sided pacemaker is unchanged in position. No significant pleural effusion is noted. Probable scarring or atelectasis is noted in the lingular segment of left upper lobe. Enlarged right perihilar density is noted most consistent with neoplasm or malignancy and probable postobstructive atelectasis or infiltrate. Bony thorax is unremarkable. IMPRESSION: Enlarged right perihilar density is noted consistent with neoplasm or malignancy and associated postobstructive atelectasis or infiltrate. Aortic Atherosclerosis (ICD10-I70.0). Electronically Signed   By: Marijo Conception, M.D.   On: 04/12/2017 08:00    Assessment and Plan  Anasarca/decreased blood pressure-heart failure, versus lung mass or both; worsening despite increasing blood pressure medications and diuretics; beta phone call to Richardson Dopp PA heart care with whom patient has an appointment this early afternoon; it is highly probable that he will send her to the hospital after he sees her  Angioedema with Ace-lip swelling and some sensation of fullness in throat; Benzapril has been DC'd and patient should not be exposed to any ACEs again    Time spent greater than 35 minutes;> 50% of time with patient was spent reviewing records, labs, tests and studies, counseling and developing plan of care  Inocencio Homes, MD

## 2017-04-14 NOTE — Progress Notes (Signed)
Pharmacist Heart Failure Core Measure Documentation  Assessment: Allison Dunn has an EF documented as 30-35% on 04/12/17 by Echo.  Rationale: Heart failure patients with left ventricular systolic dysfunction (LVSD) and an EF < 40% should be prescribed an angiotensin converting enzyme inhibitor (ACEI) or angiotensin receptor blocker (ARB) at discharge unless a contraindication is documented in the medical record.  This patient is not currently on an ACEI or ARB for HF.  This note is being placed in the record in order to provide documentation that a contraindication to the use of these agents is present for this encounter.  ACE Inhibitor or Angiotensin Receptor Blocker is contraindicated (specify all that apply)  []   ACEI allergy AND ARB allergy [x]   Concerns of angioedema []   Moderate or severe aortic stenosis []   Hyperkalemia []   Hypotension []   Renal artery stenosis []   Worsening renal function, preexisting renal disease or dysfunction  Hildred Laser, Pharm D 04/14/2017 1:17 PM

## 2017-04-14 NOTE — Progress Notes (Signed)
Progress Note  Patient Name: Allison Dunn Date of Encounter: 04/14/2017  Primary Cardiologist: Cristopher Peru, MD   Subjective   Says she is short of breath. Fatigued and speaking in short sentences.  Inpatient Medications    Scheduled Meds: . allopurinol  300 mg Oral Daily  . amiodarone  200 mg Oral Daily  . apixaban  2.5 mg Oral BID  . atorvastatin  80 mg Oral Q supper  . carvedilol  3.125 mg Oral BID WC  . donepezil  10 mg Oral QHS  . furosemide  80 mg Intravenous TID  . hydrALAZINE  50 mg Oral Q8H  . insulin aspart  5 Units Subcutaneous TID AC  . insulin glargine  10 Units Subcutaneous QHS  . multivitamin with minerals  1 tablet Oral Q lunch  . sodium chloride flush  3 mL Intravenous Q12H   Continuous Infusions: . sodium chloride     PRN Meds: sodium chloride, acetaminophen, meclizine, ondansetron (ZOFRAN) IV, sodium chloride flush   Vital Signs    Vitals:   04/13/17 1400 04/13/17 1448 04/13/17 2026 04/14/17 0545  BP: (!) 102/48 (!) 102/48 (!) 99/47 (!) 102/55  Pulse:  (!) 56 (!) 57 60  Resp:  15  16  Temp:  98 F (36.7 C) (!) 97.5 F (36.4 C) (!) 97.3 F (36.3 C)  TempSrc:  Oral Oral Oral  SpO2:  95% 99% 97%  Weight:    163 lb 9.3 oz (74.2 kg)  Height:        Intake/Output Summary (Last 24 hours) at 04/14/2017 1224 Last data filed at 04/14/2017 0100 Gross per 24 hour  Intake 480 ml  Output 1016 ml  Net -536 ml   Filed Weights   04/12/17 0611 04/13/17 0457 04/14/17 0545  Weight: 162 lb 4.1 oz (73.6 kg) 158 lb 4.6 oz (71.8 kg) 163 lb 9.3 oz (74.2 kg)    Telemetry    NSR, atrial pacing - Personally Reviewed  ECG    n/a - Personally Reviewed  Physical Exam   GEN: No acute distress.   Neck: No JVD Cardiac: RRR, no murmurs, rubs, or gallops.  Respiratory: Clear to auscultation bilaterally. GI: Soft, nontender, non-distended  MS: 2+ pitting bilateral lower extremity edema; No deformity. Neuro:  Nonfocal  Psych: Normal affect   Labs    Chemistry Recent Labs  Lab 04/11/17 1841 04/12/17 0523 04/13/17 0545 04/14/17 0439  NA 134* 136 136 134*  K 4.5 4.4 4.0 4.1  CL 102 103 102 100*  CO2 21* 22 22 25   GLUCOSE 125* 78 62* 91  BUN 59* 60* 60* 63*  CREATININE 2.38* 2.25* 2.28* 2.24*  CALCIUM 9.1 8.9 8.8* 8.9  PROT 6.9  --   --   --   ALBUMIN 2.6*  --   --   --   AST 44*  --   --   --   ALT 34  --   --   --   ALKPHOS 151*  --   --   --   BILITOT 0.5  --   --   --   GFRNONAA 18* 19* 19* 19*  GFRAA 21* 22* 22* 22*  ANIONGAP 11 11 12 9      Hematology Recent Labs  Lab 04/11/17 1841  WBC 8.5  RBC 3.66*  HGB 10.6*  HCT 31.9*  MCV 87.2  MCH 29.0  MCHC 33.2  RDW 18.1*  PLT 214    Cardiac EnzymesNo results for input(s): TROPONINI in  the last 168 hours. No results for input(s): TROPIPOC in the last 168 hours.   BNP Recent Labs  Lab 04/11/17 1853  BNP 633.4*     DDimer No results for input(s): DDIMER in the last 168 hours.   Radiology    No results found.  Cardiac Studies   Study Conclusions  - Left ventricle: The cavity size was normal. Wall thickness was increased in a pattern of mild LVH. Systolic function was moderately to severely reduced. The estimated ejection fraction was in the range of 30% to 35%. Diffuse hypokinesis. There is akinesis of the mid-apicalanterior and apical myocardium. Features are consistent with a pseudonormal left ventricular filling pattern, with concomitant abnormal relaxation and increased filling pressure (grade 2 diastolic dysfunction). Doppler parameters are consistent with high ventricular filling pressure. - Mitral valve: Calcified annulus. There was mild regurgitation. - Left atrium: The atrium was mildly dilated. - Right ventricle: The cavity size was mildly dilated. - Right atrium: The atrium was mildly dilated. - Tricuspid valve: There was moderate regurgitation. - Pulmonary arteries: Systolic pressure was mildly to  moderately increased. PA peak pressure: 51 mm Hg (S).  Patient Profile     84 y.o. female with coronary artery disease status post prior angioplasty in [place], peripheral vascular disease, paroxysmal atrial fibrillation, sinus node dysfunction status post pacemaker, dilated cardiomyopathy(EF 09-23)RAQT systolic heart failure, mitral regurgitation, diabetes, hypertension, dementia, lung mass who presented to the office with swelling and weight gain.  Assessment & Plan    1. Anasarca/acute on chronic systolic CHF: Currently on IV Lasix 80 mg tid. Continue. On Coreg.   2. CAD: Symptomatically stable. Continue Coreg and Lipitor. No ASA as on Eliquis.  3. PAF: On amio and Eliquis.  4. Pacemaker: Functioning normally.  5. Lung mass: Dr. Percival Spanish requested palliative care consult.  6. CKD stage IV: Stable, BUN 63, creatinine 2.24.   For questions or updates, please contact [address] Please consult [email or web address] for contact info under Cardiology/STEMI.      Signed, Kate Sable, MD  04/14/2017, 12:24 PM

## 2017-04-14 NOTE — Plan of Care (Signed)
  Clinical Measurements: Diagnostic test results will improve 04/14/2017 0154 - Progressing by Theador Hawthorne, RN   Clinical Measurements: Will remain free from infection 04/14/2017 0154 - Progressing by Theador Hawthorne, RN   Elimination: Will not experience complications related to urinary retention 04/14/2017 0154 - Not Progressing by Theador Hawthorne, RN   Pain Managment: General experience of comfort will improve 04/14/2017 0154 - Progressing by Theador Hawthorne, RN   Skin Integrity: Risk for impaired skin integrity will decrease 04/14/2017 0154 - Progressing by Theador Hawthorne, RN

## 2017-04-14 NOTE — Consult Note (Signed)
Consultation Note Date: 04/14/2017   Patient Name: Allison Dunn  DOB: 1931-04-08  MRN: [id number]  Age / Sex: 84 y.o., female  PCP: Hennie Duos, MD Referring Physician: Burnell Blanks,*  Reason for Consultation: Establishing goals of care and Psychosocial/spiritual support  HPI/Patient Profile: 84 y.o. female  with past medical history of coronary artery disease, peripheral vascular disease, atrial fib, pacemaker, systolic heart failure with ejection fraction of 20-25% (2018), dementia, lung mass (patient refused further workup; initially assessed on 04/2016), history of breast as well as colon cancer admitted on 04/11/2017 with increased edema, anasarca.  Per chart review, patient resides at [facility] skilled nursing facility and initial report revealed left upper extremity swelling, left lip swelling, and left facial edema, however upon presentation her edema appeared more generalized versus initial concern was for SVC in the setting of a lung mass.  Her creatinine is now 2.24, BNP on admission was 633.  Chest x-ray shows increased perihilar density consistent with neoplasm or malignancy and associated postobstructive atelectasis versus infiltrate.  Echocardiogram was completed repeated on 04/12/2017 with an EF now of 30-35%  Consult ordered for goals of care.   Clinical Assessment and Goals of Care: Met with patient, chart reviewed.  Goals of care meeting held with patient's sister, [name], who is her healthcare power of attorney.  Patient does not have any children and is unmarried. Discussed disease trajectory related to systolic heart failure and potentially new cancerous diagnosis with history of breast as well as colon cancer.  Conveyed to sister results of chest x-ray which reads "increasing perihilar density consistent with neoplasm or malignancy associated with postobstructive  atelectasis versus infiltrate"  Patient does have dementia and is unable to speak for herself in terms of complicated decisions.  Her healthcare proxy would be her sister, Allison Dunn at [phone].  Patient is unmarried and has no children  Discussed advanced directives, specifically code status.  Sister and patient are unaware of what these terms mean.  Defined full code versus DO NOT RESUSCITATE.  Reiterated that DO NOT RESUSCITATE does not mean do not treat but sets limits on aggressive measures especially in the setting of serious illness.  Family is now aware that patient is a full code.  Hard Choices for [employer] booklet as well as a MOST form given to his sister.  She wishes to use these resources as a template, guide, to talk to her other siblings regarding these decisions    SUMMARY OF RECOMMENDATIONS   Continue full code for now No further workup for  lung nodule Recommend community palliative medicine follow patient in [facility] facility.  Will place order to social work to help facilitate this. The resources in our area are through care connection at [phone] or palliaitve division of [facility] at [phone] Code Status/Advance Care Planning:  Full code    Symptom Management:   Dyspnea: Continue with targeted pulmonary treatments, oxygen, Lasix  Palliative Prophylaxis:   Aspiration, Bowel Regimen, Delirium Protocol, Eye Care, Frequent  Pain Assessment, Oral Care and Turn Reposition  Additional Recommendations (Limitations, Scope, Preferences):  Full Scope Treatment  Psycho-social/Spiritual:   Desire for further Chaplaincy support:no  Additional Recommendations: Referral to Community Resources   Prognosis:   Unable to determine  Discharge Planning: To Be Determined      Primary Diagnoses: Present on Admission: . Hypertensive heart disease with CHF (congestive heart failure) ([place]) . CAD (coronary artery disease) . Cardiac pacemaker in  situ . Paroxysmal atrial fibrillation (HCC) . Lung mass   I have reviewed the medical record, interviewed the patient and family, and examined the patient. The following aspects are pertinent.  Past Medical History:  Diagnosis Date  . Arthritis   . CAD (coronary artery disease) 10/31/2016   History of angioplasty while in [place]  //  Nuclear stress test 03/03/14 ([place]): Anteroseptal and apical infarct, no ischemia, EF 46  . Cancer [facility])    breast, left; s/p lumpectomy/chemo/radiation  . CAP (community acquired pneumonia) 05/02/2015  . Cardiac pacemaker in situ 02/19/2015  . Chronic systolic CHF (congestive heart failure) (HCC)    Echo 1/16: EF 50, mild MR, mild TR //Echo 1/17: EF 40-45, apical AK, grade 2 diastolic dysfunction, mild MR, mild LAE, moderate TR, PASP 44 //Echo 1/18: Moderate LVH, EF 20-25, anterior AK, moderate to severe MR, moderate LAE, PASP 34  . CKD (chronic kidney disease) 02/19/2015  . Degenerative cervical spinal stenosis 03/01/2016  . Dementia without behavioral disturbance 08/14/2015  . Depression   . Diabetes mellitus without complication ([place])   . Gout 02/19/2015  . H/O colon cancer, stage I 07/03/2016   s/p resection 2013 - colonic mucosa with high-grade dysplasia/adenocarcinoma in situ  . History of stroke 03/23/2015  . Hyperlipidemia   . Hypertensive heart disease with CHF (congestive heart failure) ([place]) 02/24/2015  . Lung mass 04/21/2016  . Mild cognitive impairment 02/24/2015  . Mild mitral regurgitation 03/11/2015  . Moderate tricuspid regurgitation 03/11/2015   02/2015   . Osteoarthritis 03/27/2015  . PAD (peripheral artery disease) ([place]) 02/19/2015   S/p stent in left leg // carotid US 12/17: Bilateral ICA 1-39  . Paroxysmal atrial fibrillation (HCC)   . Pulmonary arterial hypertension ([place]), mild 03/11/2015  . Spinal cord compression due to degenerative disorder of spinal column ([place]) 03/01/2016  . Transaminasemia   . Type 2 diabetes mellitus without  complication, with long-term current use of insulin ([place]) 02/24/2015  . Vitamin B12 deficiency 07/30/2016  . 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 Marital status: Widowed    Spouse name: None  . Number of children: 0  . Years of education: None  . Highest education level: None  Social Needs  . Financial resource strain: None  . Food insecurity - worry: None  . Food insecurity - inability: None  . Transportation needs - medical: None  . Transportation needs - non-medical: None  Occupational History  . Occupation: retired [employer]  Tobacco Use  . Smoking status: Passive Smoke Exposure - Never Smoker  . Smokeless tobacco: Never Used  . Tobacco comment: pt's spouse was heavy smoker, smoked in home.   Substance and Sexual Activity  . Alcohol use: No    Alcohol/week: 0.0 oz  . Drug use: No  . Sexual activity: No  Other Topics Concern  . None  Social History Narrative   Admitted to [facility] 02/23/16   Widowed   Never smoked   Alcohol none   DNR  Family History  Problem Relation Age of Onset  . Cancer Mother        pancreatic  . Diabetes Sister   . Hypertension Sister   . Asthma Sister   . Hypertension Brother    Scheduled Meds: . allopurinol  300 mg Oral Daily  . amiodarone  200 mg Oral Daily  . apixaban  2.5 mg Oral BID  . atorvastatin  80 mg Oral Q supper  . carvedilol  3.125 mg Oral BID WC  . donepezil  10 mg Oral QHS  . furosemide  80 mg Intravenous TID  . hydrALAZINE  50 mg Oral Q8H  . insulin aspart  5 Units Subcutaneous TID AC  . insulin glargine  10 Units Subcutaneous QHS  . multivitamin with minerals  1 tablet Oral Q lunch  . sodium chloride flush  3 mL Intravenous Q12H   Continuous Infusions: . sodium chloride     PRN Meds:.sodium chloride, acetaminophen, meclizine, ondansetron (ZOFRAN) IV, sodium chloride flush Medications Prior to Admission:  Prior to Admission medications   Medication  Sig Start Date End Date Taking? Authorizing Provider  acetaminophen (TYLENOL) 325 MG tablet Take 650 mg by mouth every 6 (six) hours as needed.   Yes [provider]  allopurinol (ZYLOPRIM) 300 MG tablet TAKE ONE TABLET BY MOUTH ONCE DAILY Patient taking differently: TAKE ONE TABLET (342m) BY MOUTH ONCE DAILY 12/27/15  Yes Burns, SClaudina Lick MD  amiodarone (PACERONE) 200 MG tablet Take 1 tablet (200 mg total) by mouth daily. 09/03/15  Yes Burns, SClaudina Lick MD  apixaban (ELIQUIS) 2.5 MG TABS tablet Take 2.5 mg by mouth 2 (two) times daily.   Yes [provider]  atorvastatin (LIPITOR) 80 MG tablet Take 1 tablet (80 mg total) by mouth daily with supper. 09/03/15  Yes Burns, SClaudina Lick MD  carvedilol (COREG) 3.125 MG tablet Take 1 tablet (3.125 mg total) by mouth 2 (two) times daily with a meal. 11/14/16  Yes Weaver, Scott T, PA-C  donepezil (ARICEPT) 10 MG tablet Take 1 tablet (10 mg total) by mouth at bedtime. 10/19/15  Yes ACameron Sprang MD  furosemide (LASIX) 80 MG tablet Take 80 mg by mouth daily.   Yes [provider]  HUMALOG KWIKPEN 100 UNIT/ML KiwkPen Inject 5 Units into the skin 3 (three) times daily before meals. When CBG is greater than 200; inject 8 units with a CBG greater than 300 05/09/16  Yes [provider]  hydrALAZINE (APRESOLINE) 100 MG tablet Take 100 mg by mouth 3 (three) times daily.   Yes [provider]  Insulin Glargine (TOUJEO SOLOSTAR) 300 UNIT/ML SOPN Inject 10 Units into the skin at bedtime.    Yes [provider]  meclizine (ANTIVERT) 25 MG tablet Take 25 mg by mouth 3 (three) times daily as needed for dizziness.   Yes [provider]  Multiple Vitamins-Minerals (WOMENS MULTI VITAMIN & MINERAL PO) Take 1 tablet by mouth daily with lunch.    Yes [provider]   No Known Allergies Review of Systems  Unable to perform ROS: Dementia    Physical Exam  Constitutional: She appears well-developed.  Elderly  female, alert but confused; anasarca present  Neck: Normal range of motion.  Cardiovascular: Normal rate.  Pulmonary/Chest:  Mild increased work of breathing at rest  Neurological: She is alert.  Oriented to person Does not remember why she came to the hospital  Skin: Skin is warm and dry.  Psychiatric:  Confused Anxious  Nursing  note and vitals reviewed.   Vital Signs: BP (!) 102/55 (BP Location: Left Arm)   Pulse 60   Temp (!) 97.3 F (36.3 C) (Oral)   Resp 16   Ht 5' (1.524 m)   Wt 74.2 kg (163 lb 9.3 oz) Comment: bed scale; pt wheelchair bound  LMP  (LMP Unknown)   SpO2 97%   BMI 31.95 kg/m  Pain Assessment: No/denies pain POSS *See Group Information*: S-Acceptable,Sleep, easy to arouse Pain Score: Asleep   SpO2: SpO2: 97 % O2 Device:SpO2: 97 % O2 Flow Rate: .   IO: Intake/output summary:   Intake/Output Summary (Last 24 hours) at 04/14/2017 1024 Last data filed at 04/14/2017 0100 Gross per 24 hour  Intake 480 ml  Output 1716 ml  Net -1236 ml    LBM: Last BM Date: 04/12/17 Baseline Weight: Weight: 73.7 kg (162 lb 7.7 oz) Most recent weight: Weight: 74.2 kg (163 lb 9.3 oz)(bed scale; pt wheelchair bound)     Palliative Assessment/Data:   Flowsheet Rows     Most Recent Value  Intake Tab  Referral Department  Hospitalist  Unit at Time of Referral  Cardiac/Telemetry Unit  Palliative Care Primary Diagnosis  Cardiac  Date Notified  04/12/17  Date of Admission  04/11/17  Date first seen by Palliative Care  04/14/17  # of days Palliative referral response time  2 Day(s)  # of days IP prior to Palliative referral  1  Clinical Assessment  Palliative Performance Scale Score  30%  Pain Max last 24 hours  Not able to report  Pain Min Last 24 hours  Not able to report  Dyspnea Max Last 24 Hours  Not able to report  Dyspnea Min Last 24 hours  Not able to report  Nausea Max Last 24 Hours  Not able to report  Nausea Min Last 24 Hours  Not able to report  Anxiety  Max Last 24 Hours  Not able to report  Anxiety Min Last 24 Hours  Not able to report  Other Max Last 24 Hours  Not able to report  Psychosocial & Spiritual Assessment  Palliative Care Outcomes  Patient/Family meeting held?  Yes  Who was at the meeting?  pt, her sister      Time In: 1300 Time Out: 1415 Time Total: 75 min Greater than 50%  of this time was spent counseling and coordinating care related to the above assessment and plan.  Signed by: Dory Horn, NP   Please contact Palliative Medicine Team phone at [phone] for questions and concerns.  For individual provider: See Shea Evans

## 2017-04-15 DIAGNOSIS — R601 Generalized edema: Secondary | ICD-10-CM

## 2017-04-15 LAB — BASIC METABOLIC PANEL
Anion gap: 10 (ref 5–15)
BUN: 65 mg/dL — ABNORMAL HIGH (ref 6–20)
CHLORIDE: 99 mmol/L — AB (ref 101–111)
CO2: 26 mmol/L (ref 22–32)
CREATININE: 2.26 mg/dL — AB (ref 0.44–1.00)
Calcium: 8.8 mg/dL — ABNORMAL LOW (ref 8.9–10.3)
GFR calc non Af Amer: 19 mL/min — ABNORMAL LOW (ref >60)
GFR, EST AFRICAN AMERICAN: 22 mL/min — AB (ref >60)
Glucose, Bld: 79 mg/dL (ref 65–99)
POTASSIUM: 3.9 mmol/L (ref 3.5–5.1)
Sodium: 135 mmol/L (ref 135–145)

## 2017-04-15 LAB — GLUCOSE, CAPILLARY
GLUCOSE-CAPILLARY: 62 mg/dL — AB (ref 65–99)
GLUCOSE-CAPILLARY: 121 mg/dL — AB (ref 65–99)
GLUCOSE-CAPILLARY: 66 mg/dL (ref 65–99)
GLUCOSE-CAPILLARY: 124 mg/dL — AB (ref 65–99)
GLUCOSE-CAPILLARY: 74 mg/dL (ref 65–99)
Glucose-Capillary: 146 mg/dL — ABNORMAL HIGH (ref 65–99)

## 2017-04-15 MED ORDER — FUROSEMIDE 10 MG/ML IJ SOLN
80.0000 mg | Freq: Two times a day (BID) | INTRAMUSCULAR | Status: DC
  Administered 2017-04-15 – 2017-04-16 (×3): 80 mg via INTRAVENOUS
  Filled 2017-04-15 (×4): qty 8

## 2017-04-15 NOTE — Progress Notes (Signed)
Progress Note  Patient Name: Allison Dunn Date of Encounter: 04/15/2017  Primary Cardiologist: Cristopher Peru, MD   Subjective   Feeling better. Less short of breath and less arm and leg swelling. Much more alert today compared to yesterday.  Inpatient Medications    Scheduled Meds: . allopurinol  300 mg Oral Daily  . amiodarone  200 mg Oral Daily  . apixaban  2.5 mg Oral BID  . atorvastatin  80 mg Oral Q supper  . carvedilol  3.125 mg Oral BID WC  . donepezil  10 mg Oral QHS  . furosemide  80 mg Intravenous TID  . hydrALAZINE  50 mg Oral Q8H  . insulin aspart  5 Units Subcutaneous TID AC  . insulin glargine  10 Units Subcutaneous QHS  . multivitamin with minerals  1 tablet Oral Q lunch  . sodium chloride flush  3 mL Intravenous Q12H   Continuous Infusions: . sodium chloride     PRN Meds: sodium chloride, acetaminophen, meclizine, ondansetron (ZOFRAN) IV, sodium chloride flush   Vital Signs    Vitals:   04/14/17 1500 04/14/17 2021 04/15/17 0445 04/15/17 0945  BP: (!) 116/55 (!) 114/45 (!) 102/48 (!) 105/50  Pulse: (!) 57 60 60 (!) 59  Resp: 18 18    Temp: 98 F (36.7 C) 98.2 F (36.8 C) (!) 97.4 F (36.3 C)   TempSrc: Oral Oral Oral   SpO2: 98% 96% 99%   Weight:   162 lb 4.1 oz (73.6 kg)   Height:        Intake/Output Summary (Last 24 hours) at 04/15/2017 1042 Last data filed at 04/15/2017 0700 Gross per 24 hour  Intake 840 ml  Output 625 ml  Net 215 ml   Filed Weights   04/13/17 0457 04/14/17 0545 04/15/17 0445  Weight: 158 lb 4.6 oz (71.8 kg) 163 lb 9.3 oz (74.2 kg) 162 lb 4.1 oz (73.6 kg)    Telemetry    Sinus rhythm, atrial pacing - Personally Reviewed  ECG    N/A - Personally Reviewed  Physical Exam   GEN: No acute distress.   Neck: No JVD Cardiac: RRR, no murmurs, rubs, or gallops.  Respiratory: Clear to auscultation bilaterally. GI: Soft, nontender, non-distended  MS: 1+ pitting bilateral lower extremity edema; 1+ pitting b/l  forearm edema. Neuro:  Nonfocal  Psych: Normal affect   Labs    Chemistry Recent Labs  Lab 04/11/17 1841  04/13/17 0545 04/14/17 0439 04/15/17 0408  NA 134*   < > 136 134* 135  K 4.5   < > 4.0 4.1 3.9  CL 102   < > 102 100* 99*  CO2 21*   < > 22 25 26   GLUCOSE 125*   < > 62* 91 79  BUN 59*   < > 60* 63* 65*  CREATININE 2.38*   < > 2.28* 2.24* 2.26*  CALCIUM 9.1   < > 8.8* 8.9 8.8*  PROT 6.9  --   --   --   --   ALBUMIN 2.6*  --   --   --   --   AST 44*  --   --   --   --   ALT 34  --   --   --   --   ALKPHOS 151*  --   --   --   --   BILITOT 0.5  --   --   --   --   GFRNONAA 18*   < >  19* 19* 19*  GFRAA 21*   < > 22* 22* 22*  ANIONGAP 11   < > 12 9 10    < > = values in this interval not displayed.     Hematology Recent Labs  Lab 04/11/17 1841  WBC 8.5  RBC 3.66*  HGB 10.6*  HCT 31.9*  MCV 87.2  MCH 29.0  MCHC 33.2  RDW 18.1*  PLT 214    Cardiac EnzymesNo results for input(s): TROPONINI in the last 168 hours. No results for input(s): TROPIPOC in the last 168 hours.   BNP Recent Labs  Lab 04/11/17 1853  BNP 633.4*     DDimer No results for input(s): DDIMER in the last 168 hours.   Radiology    No results found.  Cardiac Studies   Study Conclusions  - Left ventricle: The cavity size was normal. Wall thickness was increased in a pattern of mild LVH. Systolic function was moderately to severely reduced. The estimated ejection fraction was in the range of 30% to 35%. Diffuse hypokinesis. There is akinesis of the mid-apicalanterior and apical myocardium. Features are consistent with a pseudonormal left ventricular filling pattern, with concomitant abnormal relaxation and increased filling pressure (grade 2 diastolic dysfunction). Doppler parameters are consistent with high ventricular filling pressure. - Mitral valve: Calcified annulus. There was mild regurgitation. - Left atrium: The atrium was mildly dilated. - Right  ventricle: The cavity size was mildly dilated. - Right atrium: The atrium was mildly dilated. - Tricuspid valve: There was moderate regurgitation. - Pulmonary arteries: Systolic pressure was mildly to moderately increased. PA peak pressure: 51 mm Hg (S).  Patient Profile     84 y.o. female with coronary artery disease status post prior angioplasty in [place], peripheral vascular disease, paroxysmal atrial fibrillation, sinus node dysfunction status post pacemaker, dilated cardiomyopathy(EF 09-98%)PJAS systolic heart failure, mitral regurgitation, diabetes, hypertension, dementia, lung mass who presented to the office with swelling and weight gain.  Assessment & Plan    1. Anasarca/acute on chronic systolic CHF: Currently on IV Lasix 80 mg tid. Wt down 1 lb to 162 lbs with symptomatic improvement. On Coreg.  I will reduce IV Lasix to 80 mg bid.  2. CAD: Symptomatically stable. Continue Coreg and Lipitor. No ASA as on Eliquis.  3. PAF: On amio and Eliquis.  4. Pacemaker: Functioning normally.  5. Lung mass: Dr. Percival Spanish requested palliative care consult.  6. CKD stage IV: Stable, BUN 65, creatinine 2.26.    For questions or updates, please contact [facility] Please consult [email or web address] for contact info under Cardiology/STEMI.      Signed, Kate Sable, MD  04/15/2017, 10:42 AM

## 2017-04-15 NOTE — Plan of Care (Signed)
  Progressing Safety: Ability to remain free from injury will improve 04/15/2017 0703 - Progressing by Ardine Eng, RN

## 2017-04-15 NOTE — Progress Notes (Signed)
Patient's blood sugar is 62 this AM. Patient currently eating breakfast. No symptoms at this time. Will recheck after patient is finished eating.

## 2017-04-16 DIAGNOSIS — I2510 Atherosclerotic heart disease of native coronary artery without angina pectoris: Secondary | ICD-10-CM

## 2017-04-16 DIAGNOSIS — I5022 Chronic systolic (congestive) heart failure: Secondary | ICD-10-CM

## 2017-04-16 LAB — BASIC METABOLIC PANEL
ANION GAP: 12 (ref 5–15)
BUN: 68 mg/dL — ABNORMAL HIGH (ref 6–20)
CHLORIDE: 97 mmol/L — AB (ref 101–111)
CO2: 25 mmol/L (ref 22–32)
Calcium: 9 mg/dL (ref 8.9–10.3)
Creatinine, Ser: 2.3 mg/dL — ABNORMAL HIGH (ref 0.44–1.00)
GFR calc Af Amer: 21 mL/min — ABNORMAL LOW (ref >60)
GFR calc non Af Amer: 19 mL/min — ABNORMAL LOW (ref >60)
Glucose, Bld: 102 mg/dL — ABNORMAL HIGH (ref 65–99)
POTASSIUM: 4 mmol/L (ref 3.5–5.1)
SODIUM: 134 mmol/L — AB (ref 135–145)

## 2017-04-16 LAB — GLUCOSE, CAPILLARY
GLUCOSE-CAPILLARY: 137 mg/dL — AB (ref 65–99)
GLUCOSE-CAPILLARY: 114 mg/dL — AB (ref 65–99)
GLUCOSE-CAPILLARY: 135 mg/dL — AB (ref 65–99)
Glucose-Capillary: 151 mg/dL — ABNORMAL HIGH (ref 65–99)

## 2017-04-16 MED ORDER — CARVEDILOL 3.125 MG PO TABS
3.1250 mg | ORAL_TABLET | Freq: Two times a day (BID) | ORAL | Status: DC
  Administered 2017-04-17 – 2017-04-21 (×9): 3.125 mg via ORAL
  Filled 2017-04-16 (×9): qty 1

## 2017-04-16 NOTE — Care Management Important Message (Signed)
Important Message  Patient Details  Name: Allison Dunn MRN: [id number] Date of Birth: [birth year]   Medicare Important Message Given:  Yes    [name] 04/16/2017, 1:37 PM

## 2017-04-16 NOTE — Clinical Social Work Note (Signed)
CSW left message for [facility] admissions coordinator letting her know that palliative will need to follow patient at the facility once she returns.  Dayton Scrape, [place]

## 2017-04-16 NOTE — Progress Notes (Signed)
Paged MD to notify [facility]G 74. Verbal order to hold night time latus.

## 2017-04-16 NOTE — Progress Notes (Addendum)
Progress Note  Patient Name: Allison Dunn Date of Encounter: 04/16/2017  Primary Cardiologist: Cristopher Peru, MD   Subjective   SOB and LE edema continue to improve  Inpatient Medications    Scheduled Meds: . allopurinol  300 mg Oral Daily  . amiodarone  200 mg Oral Daily  . apixaban  2.5 mg Oral BID  . atorvastatin  80 mg Oral Q supper  . carvedilol  3.125 mg Oral BID WC  . donepezil  10 mg Oral QHS  . furosemide  80 mg Intravenous BID  . hydrALAZINE  50 mg Oral Q8H  . insulin aspart  5 Units Subcutaneous TID AC  . insulin glargine  10 Units Subcutaneous QHS  . multivitamin with minerals  1 tablet Oral Q lunch  . sodium chloride flush  3 mL Intravenous Q12H   Continuous Infusions: . sodium chloride     PRN Meds: sodium chloride, acetaminophen, meclizine, ondansetron (ZOFRAN) IV, sodium chloride flush   Vital Signs    Vitals:   04/15/17 2005 04/15/17 2118 04/16/17 0547 04/16/17 0600  BP: (!) 108/50 (!) 108/45 119/62   Pulse: 65  (!) 59 60  Resp: 18  18   Temp: (!) 97.4 F (36.3 C)  (!) 97.5 F (36.4 C)   TempSrc: Oral  Oral   SpO2: 95%  98%   Weight:   164 lb 14.5 oz (74.8 kg)   Height:        Intake/Output Summary (Last 24 hours) at 04/16/2017 0901 Last data filed at 04/16/2017 0654 Gross per 24 hour  Intake 240 ml  Output 1000 ml  Net -760 ml   Filed Weights   04/14/17 0545 04/15/17 0445 04/16/17 0547  Weight: 163 lb 9.3 oz (74.2 kg) 162 lb 4.1 oz (73.6 kg) 164 lb 14.5 oz (74.8 kg)    Telemetry    NSR - Personally Reviewed  ECG    No new EKG to review - Personally Reviewed  Physical Exam   GEN: No acute distress.   Neck: No JVD Cardiac: RRR, no murmurs, rubs, or gallops.  Respiratory: Clear to auscultation bilaterally. GI: Soft, nontender, non-distended  MS: trace edema; No deformity. Neuro:  Nonfocal  Psych: Normal affect   Labs    Chemistry Recent Labs  Lab 04/11/17 1841  04/14/17 0439 04/15/17 0408 04/16/17 0558  NA  134*   < > 134* 135 134*  K 4.5   < > 4.1 3.9 4.0  CL 102   < > 100* 99* 97*  CO2 21*   < > 25 26 25   GLUCOSE 125*   < > 91 79 102*  BUN 59*   < > 63* 65* 68*  CREATININE 2.38*   < > 2.24* 2.26* 2.30*  CALCIUM 9.1   < > 8.9 8.8* 9.0  PROT 6.9  --   --   --   --   ALBUMIN 2.6*  --   --   --   --   AST 44*  --   --   --   --   ALT 34  --   --   --   --   ALKPHOS 151*  --   --   --   --   BILITOT 0.5  --   --   --   --   GFRNONAA 18*   < > 19* 19* 19*  GFRAA 21*   < > 22* 22* 21*  ANIONGAP 11   < >  9 10 12    < > = values in this interval not displayed.     Hematology Recent Labs  Lab 04/11/17 1841  WBC 8.5  RBC 3.66*  HGB 10.6*  HCT 31.9*  MCV 87.2  MCH 29.0  MCHC 33.2  RDW 18.1*  PLT 214    Cardiac EnzymesNo results for input(s): TROPONINI in the last 168 hours. No results for input(s): TROPIPOC in the last 168 hours.   BNP Recent Labs  Lab 04/11/17 1853  BNP 633.4*     DDimer No results for input(s): DDIMER in the last 168 hours.   Radiology    No results found.  Cardiac Studies   Study Conclusions  - Left ventricle: The cavity size was normal. Wall thickness was increased in a pattern of mild LVH. Systolic function was moderately to severely reduced. The estimated ejection fraction was in the range of 30% to 35%. Diffuse hypokinesis. There is akinesis of the mid-apicalanterior and apical myocardium. Features are consistent with a pseudonormal left ventricular filling pattern, with concomitant abnormal relaxation and increased filling pressure (grade 2 diastolic dysfunction). Doppler parameters are consistent with high ventricular filling pressure. - Mitral valve: Calcified annulus. There was mild regurgitation. - Left atrium: The atrium was mildly dilated. - Right ventricle: The cavity size was mildly dilated. - Right atrium: The atrium was mildly dilated. - Tricuspid valve: There was moderate regurgitation. - Pulmonary arteries:  Systolic pressure was mildly to moderately increased. PA peak pressure: 51 mm Hg (S).    Patient Profile     84 y.o. female with coronary artery disease status post prior angioplasty in [place], peripheral vascular disease, paroxysmal atrial fibrillation, sinus node dysfunction status post pacemaker, dilated cardiomyopathy(EF 43-20%)QVLD systolic heart failure, mitral regurgitation, diabetes, hypertension, dementia, lung mass who presented to the office with swelling and weight gain.   Assessment & Plan    1. Anasarca/acute on chronic systolic CHF -She put out 1 L urine output yesterday and is net -1.9 L -Weights do not appear accurate she actually gained 2 pounds despite putting out a liter fluid -continue Lasix 80 mg IV twice daily -Creatinine stable at 2.3 -may be ready to change to PO tomorrow - continue Carvedilol and Hydralazine - no Spironolactone/ACE I or ARB due to CKD  2. CAD -She denies any anginal symptoms. -She will continue on Lipitor 80 mg daily, carvedilol 3.125 mg twice daily.  -She is not on aspirin due to NOAC.  3. PAF - she is maintaining NSR - continue Amio 200mg  daily, carvedilol 3.125mg  BID and APixaban 2.5mg  BID (age >43 and Creat > 1.5)  4. SSS s/p pacemaker: Functioning normally.  5. Lung mass - Palliative care to follow patient at [place]  6. CKD stage IV - creatinine stable at 2.3   For questions or updates, please contact [place] Please consult [email or web address] for contact info under Cardiology/STEMI.      Signed, Fransico Him, MD  04/16/2017, 9:01 AM

## 2017-04-17 DIAGNOSIS — I5020 Unspecified systolic (congestive) heart failure: Secondary | ICD-10-CM

## 2017-04-17 LAB — BASIC METABOLIC PANEL
ANION GAP: 11 (ref 5–15)
BUN: 70 mg/dL — ABNORMAL HIGH (ref 6–20)
CALCIUM: 8.7 mg/dL — AB (ref 8.9–10.3)
CHLORIDE: 99 mmol/L — AB (ref 101–111)
CO2: 25 mmol/L (ref 22–32)
Creatinine, Ser: 2.17 mg/dL — ABNORMAL HIGH (ref 0.44–1.00)
GFR calc non Af Amer: 20 mL/min — ABNORMAL LOW (ref >60)
GFR, EST AFRICAN AMERICAN: 23 mL/min — AB (ref >60)
GLUCOSE: 121 mg/dL — AB (ref 65–99)
POTASSIUM: 3.9 mmol/L (ref 3.5–5.1)
Sodium: 135 mmol/L (ref 135–145)

## 2017-04-17 LAB — GLUCOSE, CAPILLARY
GLUCOSE-CAPILLARY: 102 mg/dL — AB (ref 65–99)
Glucose-Capillary: 88 mg/dL (ref 65–99)
Glucose-Capillary: 109 mg/dL — ABNORMAL HIGH (ref 65–99)
Glucose-Capillary: 140 mg/dL — ABNORMAL HIGH (ref 65–99)

## 2017-04-17 MED ORDER — FUROSEMIDE 40 MG PO TABS: 40.0000 mg | ORAL_TABLET | Freq: Two times a day (BID) | ORAL | Status: DC

## 2017-04-17 MED ORDER — INSULIN ASPART 100 UNIT/ML ~~LOC~~ SOLN
5.0000 [IU] | Freq: Three times a day (TID) | SUBCUTANEOUS | Status: DC
  Administered 2017-04-19 – 2017-04-21 (×6): 5 [IU] via SUBCUTANEOUS

## 2017-04-17 MED ORDER — HYDRALAZINE HCL 25 MG PO TABS
25.0000 mg | ORAL_TABLET | Freq: Three times a day (TID) | ORAL | Status: DC
  Filled 2017-04-17 (×2): qty 1

## 2017-04-17 MED ORDER — FUROSEMIDE 80 MG PO TABS: 80.0000 mg | ORAL_TABLET | Freq: Two times a day (BID) | ORAL | Status: DC

## 2017-04-17 NOTE — Progress Notes (Addendum)
Progress Note  Patient Name: Allison Dunn Date of Encounter: 04/17/2017  Primary Cardiologist: Cristopher Peru, MD   Subjective   Denies any CP or SOB. Very weak  Inpatient Medications    Scheduled Meds: . allopurinol  300 mg Oral Daily  . amiodarone  200 mg Oral Daily  . apixaban  2.5 mg Oral BID  . atorvastatin  80 mg Oral Q supper  . carvedilol  3.125 mg Oral BID WC  . donepezil  10 mg Oral QHS  . furosemide  80 mg Intravenous BID  . hydrALAZINE  50 mg Oral Q8H  . insulin aspart  5 Units Subcutaneous TID AC  . insulin glargine  10 Units Subcutaneous QHS  . multivitamin with minerals  1 tablet Oral Q lunch  . sodium chloride flush  3 mL Intravenous Q12H   Continuous Infusions: . sodium chloride     PRN Meds: sodium chloride, acetaminophen, meclizine, ondansetron (ZOFRAN) IV, sodium chloride flush   Vital Signs    Vitals:   04/16/17 2100 04/16/17 2244 04/17/17 0417 04/17/17 0841  BP: (!) 99/58 (!) 123/57 (!) 94/44 (!) 92/44  Pulse: (!) 59 (!) 59 (!) 53 (!) 59  Resp: 18  18 18   Temp: 98.4 F (36.9 C)  (!) 97.3 F (36.3 C) (!) 97.5 F (36.4 C)  TempSrc: Oral  Oral Oral  SpO2: 100%  98% 97%  Weight:   164 lb 7.4 oz (74.6 kg)   Height:        Intake/Output Summary (Last 24 hours) at 04/17/2017 0854 Last data filed at 04/17/2017 0828 Gross per 24 hour  Intake 780 ml  Output 500 ml  Net 280 ml   Filed Weights   04/15/17 0445 04/16/17 0547 04/17/17 0417  Weight: 162 lb 4.1 oz (73.6 kg) 164 lb 14.5 oz (74.8 kg) 164 lb 7.4 oz (74.6 kg)    Telemetry    Sinus bradycardia HR 50s, pacing spike does not consistently land in front of QRS - Personally Reviewed  ECG    No EKG - Personally Reviewed  Physical Exam   GEN: No acute distress.   Neck: No JVD Cardiac: RRR, no murmurs, rubs, or gallops.  Respiratory: Clear to auscultation bilaterally. GI: Soft, nontender, non-distended  MS:  No deformity. 1-2+ pitting edema in the arms Neuro:  Nonfocal    Psych: Normal affect   Labs    Chemistry Recent Labs  Lab 04/11/17 1841  04/14/17 0439 04/15/17 0408 04/16/17 0558  NA 134*   < > 134* 135 134*  K 4.5   < > 4.1 3.9 4.0  CL 102   < > 100* 99* 97*  CO2 21*   < > 25 26 25   GLUCOSE 125*   < > 91 79 102*  BUN 59*   < > 63* 65* 68*  CREATININE 2.38*   < > 2.24* 2.26* 2.30*  CALCIUM 9.1   < > 8.9 8.8* 9.0  PROT 6.9  --   --   --   --   ALBUMIN 2.6*  --   --   --   --   AST 44*  --   --   --   --   ALT 34  --   --   --   --   ALKPHOS 151*  --   --   --   --   BILITOT 0.5  --   --   --   --   GFRNONAA 18*   < >  19* 19* 19*  GFRAA 21*   < > 22* 22* 21*  ANIONGAP 11   < > 9 10 12    < > = values in this interval not displayed.     Hematology Recent Labs  Lab 04/11/17 1841  WBC 8.5  RBC 3.66*  HGB 10.6*  HCT 31.9*  MCV 87.2  MCH 29.0  MCHC 33.2  RDW 18.1*  PLT 214    Cardiac EnzymesNo results for input(s): TROPONINI in the last 168 hours. No results for input(s): TROPIPOC in the last 168 hours.   BNP Recent Labs  Lab 04/11/17 1853  BNP 633.4*     DDimer No results for input(s): DDIMER in the last 168 hours.   Radiology    No results found.  Cardiac Studies   Echo 04/12/2017 LV EF: 30% -   35%   Study Conclusions  - Left ventricle: The cavity size was normal. Wall thickness was   increased in a pattern of mild LVH. Systolic function was   moderately to severely reduced. The estimated ejection fraction   was in the range of 30% to 35%. Diffuse hypokinesis. There is   akinesis of the mid-apicalanterior and apical myocardium.   Features are consistent with a pseudonormal left ventricular   filling pattern, with concomitant abnormal relaxation and   increased filling pressure (grade 2 diastolic dysfunction).   Doppler parameters are consistent with high ventricular filling   pressure. - Mitral valve: Calcified annulus. There was mild regurgitation. - Left atrium: The atrium was mildly dilated. -  Right ventricle: The cavity size was mildly dilated. - Right atrium: The atrium was mildly dilated. - Tricuspid valve: There was moderate regurgitation. - Pulmonary arteries: Systolic pressure was mildly to moderately   increased. PA peak pressure: 51 mm Hg (S).  Impressions:  - Definity used; akinesis of the distal anterior wall and apex;   overall moderate to severe LV dysfunction; mild LVH; moderate   diastolic dysfunction; mild MR; mild LAE/RAE/RVE; moderate TR   with mild to moderate pulmonary hypertension.  Patient Profile     84 y.o. female with PMH of CAD, PAF, SSS s/p PPM and lung mass presented with anasarca.   Assessment & Plan    1. Anasarca/acute on chronic systolic HF: pending BMET. Extremely week this morning. BP soft, concern for dehydration from intravascular perspective. I stopped her IV lasix.   - no longer swollen in lower extremity, however continue to have upper extremity swelling, maybe related to low albumin 2.6 on 04/11/2017.   - will hold off on diuretic this morning, start PO lasix 80mg  BID this afternoon. Pending BMET. Weight not very accurate.   - Echo 04/12/2017 EF 30-35%, grade 2 DD  2. CAD: not on ASA due to the need for NOAC   3. PAF: maintaining NSR on Amiodarone 200mg  daily. Continue eliquis  4. SSS s/p PPM: MD to review telemetry, pacing spike appears to be moving and not consistently in front of QRS. May need interrogation  - pacemaker interrogated on 04/12/2017, interrogation report in the shadow chart. Noted normal device function  5. Lung mass: palliative care to [facility] at [facility]  6. CKD stage IV: pending BMET  For questions or updates, please contact [place] Please consult [email or web address] for contact info under Cardiology/STEMI.      Hilbert Corrigan, PA  04/17/2017, 8:54 AM

## 2017-04-18 DIAGNOSIS — I2583 Coronary atherosclerosis due to lipid rich plaque: Secondary | ICD-10-CM

## 2017-04-18 DIAGNOSIS — I5021 Acute systolic (congestive) heart failure: Secondary | ICD-10-CM

## 2017-04-18 LAB — CBC WITH DIFFERENTIAL/PLATELET
BASOS ABS: 0 10*3/uL (ref 0.0–0.1)
Basophils Relative: 0 %
EOS ABS: 0.2 10*3/uL (ref 0.0–0.7)
Eosinophils Relative: 3 %
HCT: 29.7 % — ABNORMAL LOW (ref 36.0–46.0)
HEMOGLOBIN: 9.7 g/dL — AB (ref 12.0–15.0)
LYMPHS ABS: 0.9 10*3/uL (ref 0.7–4.0)
Lymphocytes Relative: 14 %
MCH: 28.4 pg (ref 26.0–34.0)
MCHC: 32.7 g/dL (ref 30.0–36.0)
MCV: 86.8 fL (ref 78.0–100.0)
Monocytes Absolute: 0.5 10*3/uL (ref 0.1–1.0)
Monocytes Relative: 7 %
NEUTROS PCT: 76 %
Neutro Abs: 5.3 10*3/uL (ref 1.7–7.7)
Platelets: 189 10*3/uL (ref 150–400)
RBC: 3.42 MIL/uL — ABNORMAL LOW (ref 3.87–5.11)
RDW: 18.3 % — ABNORMAL HIGH (ref 11.5–15.5)
WBC: 6.9 10*3/uL (ref 4.0–10.5)

## 2017-04-18 LAB — GLUCOSE, CAPILLARY
GLUCOSE-CAPILLARY: 112 mg/dL — AB (ref 65–99)
GLUCOSE-CAPILLARY: 121 mg/dL — AB (ref 65–99)
GLUCOSE-CAPILLARY: 95 mg/dL (ref 65–99)
Glucose-Capillary: 59 mg/dL — ABNORMAL LOW (ref 65–99)
Glucose-Capillary: 114 mg/dL — ABNORMAL HIGH (ref 65–99)

## 2017-04-18 MED ORDER — HYDRALAZINE HCL 10 MG PO TABS
10.0000 mg | ORAL_TABLET | Freq: Three times a day (TID) | ORAL | Status: DC
  Administered 2017-04-18 – 2017-04-21 (×6): 10 mg via ORAL
  Filled 2017-04-18 (×10): qty 1

## 2017-04-18 NOTE — Progress Notes (Addendum)
Progress Note  Patient Name: Allison Dunn Date of Encounter: 04/18/2017  Primary Cardiologist: Cristopher Peru, MD   Subjective   Feels better today with less SOB.  She has been up in the chair.  Still fatigued.    Inpatient Medications    Scheduled Meds: . allopurinol  300 mg Oral Daily  . amiodarone  200 mg Oral Daily  . apixaban  2.5 mg Oral BID  . atorvastatin  80 mg Oral Q supper  . carvedilol  3.125 mg Oral BID WC  . donepezil  10 mg Oral QHS  . furosemide  40 mg Oral BID  . hydrALAZINE  25 mg Oral Q8H  . insulin aspart  5 Units Subcutaneous TID AC  . insulin glargine  10 Units Subcutaneous QHS  . multivitamin with minerals  1 tablet Oral Q lunch  . sodium chloride flush  3 mL Intravenous Q12H   Continuous Infusions: . sodium chloride     PRN Meds: sodium chloride, acetaminophen, meclizine, ondansetron (ZOFRAN) IV, sodium chloride flush   Vital Signs    Vitals:   04/17/17 1943 04/17/17 2245 04/18/17 0443 04/18/17 0611  BP: (!) 102/52 104/62 (!) 97/54 (!) 103/50  Pulse: 60  (!) 56   Resp: 18  18   Temp: 98 F (36.7 C)  98 F (36.7 C)   TempSrc: Oral  Oral   SpO2: 98%  98%   Weight:   162 lb 14.7 oz (73.9 kg)   Height:        Intake/Output Summary (Last 24 hours) at 04/18/2017 0941 Last data filed at 04/18/2017 0700 Gross per 24 hour  Intake 650 ml  Output 1350 ml  Net -700 ml   Filed Weights   04/16/17 0547 04/17/17 0417 04/18/17 0443  Weight: 164 lb 14.5 oz (74.8 kg) 164 lb 7.4 oz (74.6 kg) 162 lb 14.7 oz (73.9 kg)    Telemetry    Difficult to assess ? Abnormal pacer spikes - Personally Reviewed  ECG    No new EKG to review - Personally Reviewed  Physical Exam   GEN: No acute distress.   Neck: No JVD Cardiac: RRR, no murmurs, rubs, or gallops.  Respiratory: Clear to auscultation bilaterally. GI: Soft, nontender, non-distended  MS: No edema; No deformity. Neuro:  Nonfocal  Psych: Normal affect   Labs    Chemistry Recent Labs    Lab 04/11/17 1841  04/15/17 0408 04/16/17 0558 04/17/17 0904  NA 134*   < > 135 134* 135  K 4.5   < > 3.9 4.0 3.9  CL 102   < > 99* 97* 99*  CO2 21*   < > 26 25 25   GLUCOSE 125*   < > 79 102* 121*  BUN 59*   < > 65* 68* 70*  CREATININE 2.38*   < > 2.26* 2.30* 2.17*  CALCIUM 9.1   < > 8.8* 9.0 8.7*  PROT 6.9  --   --   --   --   ALBUMIN 2.6*  --   --   --   --   AST 44*  --   --   --   --   ALT 34  --   --   --   --   ALKPHOS 151*  --   --   --   --   BILITOT 0.5  --   --   --   --   GFRNONAA 18*   < > 19* 19*  20*  GFRAA 21*   < > 22* 21* 23*  ANIONGAP 11   < > 10 12 11    < > = values in this interval not displayed.     Hematology Recent Labs  Lab 04/11/17 1841  WBC 8.5  RBC 3.66*  HGB 10.6*  HCT 31.9*  MCV 87.2  MCH 29.0  MCHC 33.2  RDW 18.1*  PLT 214    Cardiac EnzymesNo results for input(s): TROPONINI in the last 168 hours. No results for input(s): TROPIPOC in the last 168 hours.   BNP Recent Labs  Lab 04/11/17 1853  BNP 633.4*     DDimer No results for input(s): DDIMER in the last 168 hours.   Radiology    No results found.  Cardiac Studies   Echo 04/12/2017 LV EF: 30% - 35%   Study Conclusions  - Left ventricle: The cavity size was normal. Wall thickness was increased in a pattern of mild LVH. Systolic function was moderately to severely reduced. The estimated ejection fraction was in the range of 30% to 35%. Diffuse hypokinesis. There is akinesis of the mid-apicalanterior and apical myocardium. Features are consistent with a pseudonormal left ventricular filling pattern, with concomitant abnormal relaxation and increased filling pressure (grade 2 diastolic dysfunction). Doppler parameters are consistent with high ventricular filling pressure. - Mitral valve: Calcified annulus. There was mild regurgitation. - Left atrium: The atrium was mildly dilated. - Right ventricle: The cavity size was mildly dilated. - Right  atrium: The atrium was mildly dilated. - Tricuspid valve: There was moderate regurgitation. - Pulmonary arteries: Systolic pressure was mildly to moderately increased. PA peak pressure: 51 mm Hg (S).  Impressions:  - Definity used; akinesis of the distal anterior wall and apex; overall moderate to severe LV dysfunction; mild LVH; moderate diastolic dysfunction; mild MR; mild LAE/RAE/RVE; moderate TR with mild to moderate pulmonary hypertension.   Patient Profile     84 y.o. female  with PMH of CAD, PAF, SSS s/p PPM and lung mass presented with anasarca.    Assessment & Plan    1. Anasarca/acute on chronic systolic HF  - Echo 0/53/9767 EF 30-35%, grade 2 DD - remains weak this am but overall feels better - sat up in chair yesterday - diuretics on hold for volume depletion - creatinine improved with holding diuretics - appears euvolemic on exam and actually likely still dry (BUN 70) - continue to hold diuretics - decrease Hydralazine to 10mg  TID due to soft BP  - continue carvedilol 3.125mg  BID  2. CAD - denies CP - continue BB and high dose statin - no ASA due to NOAC  3. PAF - appears in NSR but tele voltage very low and difficult to assess - check 12 lead EKG - continue BB, Amio and low dose apixaban for elevated creatinine and advanced age  22. SSS s/p PPM - pacing spike appears to be moving and not consistently in front of QRS but tracings are poor          - pacemaker interrogated on 04/12/2017 Noted normal device function - will ask rep to reinterrogate device  5. Lung mass -  pall[facility] to [facility] at [facility]  6. CKD stage IV - creatinine slowly improving with holding diuretics  - creatinine 1.16 last fall and 2.38 this admit - repeat BMET in am    For questions or updates, please contact [place] Please consult [email or web address] for contact info under Cardiology/STEMI.      Signed,  Fransico Him, MD  04/18/2017, 9:41 AM

## 2017-04-18 NOTE — Progress Notes (Signed)
Pt CBG low  RN provided orange juice and fed pt breakfast  Will re check CBG

## 2017-04-19 ENCOUNTER — Encounter (HOSPITAL_COMMUNITY): Payer: Self-pay | Admitting: Internal Medicine

## 2017-04-19 ENCOUNTER — Inpatient Hospital Stay (HOSPITAL_COMMUNITY): Payer: Medicare Other

## 2017-04-19 DIAGNOSIS — R600 Localized edema: Secondary | ICD-10-CM

## 2017-04-19 DIAGNOSIS — I495 Sick sinus syndrome: Secondary | ICD-10-CM

## 2017-04-19 DIAGNOSIS — R609 Edema, unspecified: Secondary | ICD-10-CM

## 2017-04-19 LAB — GLUCOSE, CAPILLARY
GLUCOSE-CAPILLARY: 31 mg/dL — AB (ref 65–99)
GLUCOSE-CAPILLARY: 92 mg/dL (ref 65–99)
Glucose-Capillary: 166 mg/dL — ABNORMAL HIGH (ref 65–99)
Glucose-Capillary: 98 mg/dL (ref 65–99)
Glucose-Capillary: 126 mg/dL — ABNORMAL HIGH (ref 65–99)

## 2017-04-19 LAB — BASIC METABOLIC PANEL
ANION GAP: 9 (ref 5–15)
BUN: 69 mg/dL — AB (ref 6–20)
CALCIUM: 8.8 mg/dL — AB (ref 8.9–10.3)
CO2: 27 mmol/L (ref 22–32)
Chloride: 98 mmol/L — ABNORMAL LOW (ref 101–111)
Creatinine, Ser: 2.02 mg/dL — ABNORMAL HIGH (ref 0.44–1.00)
GFR calc Af Amer: 25 mL/min — ABNORMAL LOW (ref >60)
GFR, EST NON AFRICAN AMERICAN: 22 mL/min — AB (ref >60)
GLUCOSE: 72 mg/dL (ref 65–99)
Potassium: 4 mmol/L (ref 3.5–5.1)
Sodium: 134 mmol/L — ABNORMAL LOW (ref 135–145)

## 2017-04-19 MED ORDER — INSULIN GLARGINE 100 UNIT/ML ~~LOC~~ SOLN: 5.0000 [IU] | Freq: Every day | SUBCUTANEOUS | Status: DC

## 2017-04-19 MED ORDER — DEXTROSE 50 % IV SOLN
INTRAVENOUS | Status: AC
  Administered 2017-04-19: 50 mL
  Filled 2017-04-19: qty 50

## 2017-04-19 NOTE — Progress Notes (Addendum)
Device interrogation yesterday demonstrated normal device function. Device was programmed with MVP on and Mobitz I heart block at 60 bpm resulting in bradycardia and significantly prolonged PR intervals. Device reprogrammed to fixed AV delays which will result in increased RV pacing but with physiologic AV delays. Follow up arranged in EP clinic.  Chanetta Marshall, NP 04/19/2017 8:23 AM   EP Attending  Discussed with Chanetta Marshall, NP. Agree with above.   Mikle Bosworth.D.

## 2017-04-19 NOTE — Progress Notes (Signed)
Progress Note  Patient Name: Allison Dunn Date of Encounter: 04/19/2017  Primary Cardiologist: Cristopher Peru, MD   Subjective   Says she is breathing ok, cannot lift arms to eat. C/o pain when touched  Inpatient Medications    Scheduled Meds: . allopurinol  300 mg Oral Daily  . amiodarone  200 mg Oral Daily  . apixaban  2.5 mg Oral BID  . atorvastatin  80 mg Oral Q supper  . carvedilol  3.125 mg Oral BID WC  . donepezil  10 mg Oral QHS  . hydrALAZINE  10 mg Oral Q8H  . insulin aspart  5 Units Subcutaneous TID AC  . insulin glargine  10 Units Subcutaneous QHS  . multivitamin with minerals  1 tablet Oral Q lunch  . sodium chloride flush  3 mL Intravenous Q12H   Continuous Infusions: . sodium chloride     PRN Meds: sodium chloride, acetaminophen, meclizine, ondansetron (ZOFRAN) IV, sodium chloride flush   Vital Signs    Vitals:   04/18/17 0611 04/18/17 1145 04/18/17 2007 04/19/17 0508  BP: (!) 103/50 (!) 104/46 (!) 128/56 (!) 109/43  Pulse:  (!) 56 (!) 56 60  Resp:  18 18 18   Temp:  98.4 F (36.9 C) 98 F (36.7 C) 98 F (36.7 C)  TempSrc:  Oral Oral Oral  SpO2:  100% 100% 100%  Weight:    161 lb 2.5 oz (73.1 kg)  Height:        Intake/Output Summary (Last 24 hours) at 04/19/2017 0800 Last data filed at 04/19/2017 0600 Gross per 24 hour  Intake 600 ml  Output 800 ml  Net -200 ml   Filed Weights   04/17/17 0417 04/18/17 0443 04/19/17 0508  Weight: 164 lb 7.4 oz (74.6 kg) 162 lb 14.7 oz (73.9 kg) 161 lb 2.5 oz (73.1 kg)    Telemetry    Interrogation report reviewed, pt was in [place], programming changed to DDDR mode, now AV pacing- Personally Reviewed  ECG    No new EKG to review - Personally Reviewed  Physical Exam   GEN: No acute distress.   Neck: JVD 10-11 cm Cardiac: RRR, soft murmurs, rubs, or gallops.  Respiratory: Rales bases bilaterally. GI: Soft, nontender, non-distended  MS: No edema; No deformity. Neuro:  Nonfocal, unable to  lift her arms, cannot eat by herself  Psych: Normal affect   Labs    Chemistry Recent Labs  Lab 04/15/17 0408 04/16/17 0558 04/17/17 0904  NA 135 134* 135  K 3.9 4.0 3.9  CL 99* 97* 99*  CO2 26 25 25   GLUCOSE 79 102* 121*  BUN 65* 68* 70*  CREATININE 2.26* 2.30* 2.17*  CALCIUM 8.8* 9.0 8.7*  GFRNONAA 19* 19* 20*  GFRAA 22* 21* 23*  ANIONGAP 10 12 11      Hematology Recent Labs  Lab 04/18/17 1048  WBC 6.9  RBC 3.42*  HGB 9.7*  HCT 29.7*  MCV 86.8  MCH 28.4  MCHC 32.7  RDW 18.3*  PLT 189    Radiology    No results found.  Cardiac Studies   Echo 04/12/2017 LV EF: 30% - 35% Study Conclusions - Left ventricle: The cavity size was normal. Wall thickness was increased in a pattern of mild LVH. Systolic function was moderately to severely reduced. The estimated ejection fraction was in the range of 30% to 35%. Diffuse hypokinesis. There is akinesis of the mid-apicalanterior and apical myocardium. Features are consistent with a pseudonormal left ventricular filling pattern,  with concomitant abnormal relaxation and increased filling pressure (grade 2 diastolic dysfunction). Doppler parameters are consistent with high ventricular filling pressure. - Mitral valve: Calcified annulus. There was mild regurgitation. - Left atrium: The atrium was mildly dilated. - Right ventricle: The cavity size was mildly dilated. - Right atrium: The atrium was mildly dilated. - Tricuspid valve: There was moderate regurgitation. - Pulmonary arteries: Systolic pressure was mildly to moderately increased. PA peak pressure: 51 mm Hg (S).  Impressions:  - Definity used; akinesis of the distal anterior wall and apex; overall moderate to severe LV dysfunction; mild LVH; moderate diastolic dysfunction; mild MR; mild LAE/RAE/RVE; moderate TR with mild to moderate pulmonary hypertension.   Patient Profile     84 y.o. female  with PMH of CAD, PAF, SSS  s/p PPM and lung mass presented with anasarca.   Assessment & Plan    1. Anasarca/acute on chronic systolic HF  - Echo 3/00/9233 EF 30-35%, grade 2 DD - remains extremely weak this am but overall feels better - sat up in chair yesterday, not sure she feels well enough today - diuretics on hold for volume depletion, has volume overload by exam, discuss with MD  - creatinine improving with holding diuretics - BUN 70 on 02/27, recheck now - BP improved on decreased Hydralazine  - continue carvedilol 3.125mg  BID  2. CAD - denies CP - continue BB and high dose statin - no ASA due to NOAC  3. PAF - was Wenkebach, now AV pacing - continue BB, Amio and low dose apixaban for elevated creatinine and advanced age  44. SSS s/p PPM -  Programming changed, device now functioning better  5. Lung mass -  palliative care to [facility] at [facility]  6. CKD stage IV - creatinine slowly improving with holding diuretics  - creatinine 1.16 last fall and 2.38 this admit - repeat BMET now and daily 0  For questions or updates, please contact [place] Please consult [email or web address] for contact info under Cardiology/STEMI.      Signed, Rosaria Ferries, PA-C  04/19/2017, 8:00 AM

## 2017-04-19 NOTE — Progress Notes (Signed)
Hypoglycemic Event  CBG: 31  Treatment: dextrose 50   Symptoms: diaphoretic  Follow-up CBG: [id number] CBG Result:166  Possible Reasons for Event: Pt got 10 units of lantus last night.  Comments/MD notified:hypoglycemia protocol     Allison Dunn

## 2017-04-19 NOTE — Plan of Care (Signed)
  Progressing Activity: Risk for activity intolerance will decrease 04/19/2017 1108 - Progressing by Harlin Heys, RN Nutrition: Adequate nutrition will be maintained 04/19/2017 1108 - Progressing by Harlin Heys, RN Pain Managment: General experience of comfort will improve 04/19/2017 1108 - Progressing by Harlin Heys, RN Safety: Ability to remain free from injury will improve 04/19/2017 1108 - Progressing by Harlin Heys, RN Skin Integrity: Risk for impaired skin integrity will decrease 04/19/2017 1108 - Progressing by Harlin Heys, RN Activity: Capacity to carry out activities will improve 04/19/2017 1108 - Progressing by Harlin Heys, RN Cardiac: Ability to achieve and maintain adequate cardiopulmonary perfusion will improve 04/19/2017 1108 - Progressing by Harlin Heys, RN   Not Progressing Education: Knowledge of General Education information will improve 04/19/2017 1108 - Not Progressing by Harlin Heys, [place] Behavior/Discharge Planning: Ability to manage health-related needs will improve 04/19/2017 1108 - Not Progressing by Harlin Heys, RN Education: Ability to demonstrate management of disease process will improve 04/19/2017 1108 - Not Progressing by Harlin Heys, RN Ability to verbalize understanding of medication therapies will improve 04/19/2017 1108 - Not Progressing by Harlin Heys, RN

## 2017-04-19 NOTE — Progress Notes (Signed)
Inpatient Diabetes Program Recommendations  AACE/ADA: New Consensus Statement on Inpatient Glycemic Control (2015)  Target Ranges:  Prepandial:   less than 140 mg/dL      Peak postprandial:   less than 180 mg/dL (1-2 hours)      Critically ill patients:  140 - 180 mg/dL   Lab Results  Component Value Date   GLUCAP 92 04/19/2017   HGBA1C 8.6 09/11/2016    Review of Glycemic Control  Blood sugar 31 mg/dL this am. Yesterday 59 mg/dL. Adjust insulin.  Inpatient Diabetes Program Recommendations:     D/C Lantus 10 units QHS  Continue to follow.  Thank you. Lorenda Peck, RD, LDN, CDE Inpatient Diabetes Coordinator [phone]

## 2017-04-19 NOTE — Evaluation (Signed)
Physical Therapy Evaluation Patient Details Name: Allison Dunn MRN: [id number] DOB: 1932-01-05 Today's Date: 04/19/2017   History of Present Illness  Pt is an 84 y.o. female admitted 04/11/17 with CHF exacerbation. Chest CT concerning for R hilar lymphadenopathy. Pt with new onset RUE weakness 2/28; head CT shows no acute findings; remote left occipital and thalamic infarcts. Awaiting doppler to rule out BUE DVTs. PMH includes dementia, DM, CAD, DVT, CKD, a-fib, s/p pacemaker (2016), breast CA, colon CA.    Clinical Impression  Pt presents with an overall decrease in functional mobility secondary to above. Pt poor historian A&Ox2, but appropriately interactive throughout session. Reports admitted from [facility] where she requires assist for transfers to/from wheelchair. Today, required maxA+2 to perform lateral scoot transfer to recliner. Pt would benefit from continued acute PT services to maximize functional mobility and independence prior to d/c with continued SNF-level therapies.     Follow Up Recommendations SNF;Supervision/Assistance - 24 hour    Equipment Recommendations  None recommended by PT    Recommendations for Other Services       Precautions / Restrictions Precautions Precautions: Fall Restrictions Weight Bearing Restrictions: No      Mobility  Bed Mobility Overal bed mobility: Needs Assistance Bed Mobility: Supine to Sit     Supine to sit: Mod assist;+2 for physical assistance     General bed mobility comments: ModA to assist trunk elevation and assist BLEs to EOB. Pt moaning due to c/o buttocks pain upon sitting, requiring modA to maintain sitting balance  Transfers Overall transfer level: Needs assistance Equipment used: 1 person hand held assist Transfers: Lateral/Scoot Transfers          Lateral/Scoot Transfers: Max assist;+2 physical assistance General transfer comment: MaxA+2 with HHA and use of pad to scoot from bed to drop arm chair. Pt  providing very little assistance despite cues for technique to assist with BUEs and BLEs  Ambulation/Gait             General Gait Details: Unable  Stairs            Wheelchair Mobility    Modified Rankin (Stroke Patients Only)       Balance Overall balance assessment: Needs assistance   Sitting balance-Leahy Scale: Poor Sitting balance - Comments: Reliant on modA to maintain sitting balance Postural control: Posterior lean                                   Pertinent Vitals/Pain Pain Assessment: Faces Faces Pain Scale: Hurts even more Pain Location: Buttocks Pain Descriptors / Indicators: Moaning;Sore Pain Intervention(s): Repositioned;Other (comment)(RN notified to assess for pressure injury)    Home Living Family/patient expects to be discharged to:: Skilled nursing facility                 Additional Comments: Pt poor historian. Reports from [facility]. Unsure if has been there for assisted living, or receiving SNF-level PT/OT    Prior Function Level of Independence: Needs assistance   Gait / Transfers Assistance Needed: Pt reports requires assist for transfers to w/c  ADL's / Homemaking Assistance Needed: Pt reports she is mostly indep with ADLs (unreliable historian)        Hand Dominance        Extremity/Trunk Assessment   Upper Extremity Assessment Upper Extremity Assessment: Generalized weakness;RUE deficits/detail;LUE deficits/detail RUE Deficits / Details: significant swelling throughout BUEs; R elbow 3/5, able to  abduct shoulders to grossly 90' RUE Coordination: decreased fine motor;decreased gross motor LUE Deficits / Details: significant swelling throughout BUEs; L elbow 3/5, able to abduct shoulders to grossly 90' LUE Coordination: decreased fine motor;decreased gross motor    Lower Extremity Assessment Lower Extremity Assessment: Generalized weakness       Communication   Communication: No difficulties   Cognition Arousal/Alertness: Awake/alert Behavior During Therapy: Flat affect Overall Cognitive Status: History of cognitive impairments - at baseline Area of Impairment: Orientation;Attention;Memory;Following commands;Awareness;Problem solving                 Orientation Level: Disoriented to;Place;Situation Current Attention Level: Selective Memory: Decreased short-term memory Following Commands: Follows one step commands with increased time   Awareness: Intellectual Problem Solving: Slow processing;Difficulty sequencing;Requires verbal cues        General Comments      Exercises     Assessment/Plan    PT Assessment Patient needs continued PT services  PT Problem List Decreased strength;Decreased activity tolerance;Decreased balance;Decreased mobility;Decreased skin integrity       PT Treatment Interventions DME instruction;Gait training;Functional mobility training;Therapeutic activities;Therapeutic exercise;Balance training;Patient/family education;Wheelchair mobility training    PT Goals (Current goals can be found in the Care Plan section)  Acute Rehab PT Goals Patient Stated Goal: Return to [facility] PT Goal Formulation: With patient Time For Goal Achievement: 05/03/17 Potential to Achieve Goals: Good    Frequency Min 2X/week   Barriers to discharge        Co-evaluation PT/OT/SLP Co-Evaluation/Treatment: Yes Reason for Co-Treatment: For patient/therapist safety;To address functional/ADL transfers PT goals addressed during session: Mobility/safety with mobility         AM-PAC PT "6 Clicks" Daily Activity  Outcome Measure Difficulty turning over in bed (including adjusting bedclothes, sheets and blankets)?: Unable Difficulty moving from lying on back to sitting on the side of the bed? : Unable Difficulty sitting down on and standing up from a chair with arms (e.g., wheelchair, bedside commode, etc,.)?: Unable Help needed moving to and from a bed  to chair (including a wheelchair)?: A Lot Help needed walking in hospital room?: Total Help needed climbing 3-5 steps with a railing? : Total 6 Click Score: 7    End of Session   Activity Tolerance: Patient limited by pain Patient left: in chair;with call bell/phone within reach;with chair alarm set Nurse Communication: Mobility status PT Visit Diagnosis: Other abnormalities of gait and mobility (R26.89);Muscle weakness (generalized) (M62.81)    Time: 3016-0109 PT Time Calculation (min) (ACUTE ONLY): 24 min   Charges:   PT Evaluation $PT Eval Moderate Complexity: 1 Mod     PT G Codes:       Mabeline Caras, PT, DPT Acute Rehab Services  Pager: [place] 04/19/2017, 5:08 PM

## 2017-04-19 NOTE — Consult Note (Signed)
Medical Consultation   [name]  ZOX:[id number]  DOB: [birth year]  DOA: 04/11/2017  PCP: Hennie Duos, MD   Outpatient Specialists: Lovena Le, Cardiology; Lake Bells, pulmonology; Delice Lesch, neurology   Requesting physician: Rosaria Ferries, PA-C  Reason for consultation: Admitted for anasarca, s/p diuresis.  Has a number of other medical problems.  Currently, the issue appears to be general decline with DM, dementia, lung mass.  She is getting a head CT due to decreased movement.  She is stabilized from a heart standpoint.  History of Present Illness: Allison Dunn is an 84 y.o. female with h/o DM, afib, PAD, dementia, lung mass, systolic CHF, HLD, h/o CVA, h/o colon CA, depression, CKD, pacemaker placement, breast CA, and CAD admitted on 2/20 with anasarca thought to be related to CHF, EF 20-25% in 2018.  There was mention of possible SVC syndrome resulting from mass effect from previously-recognized lung mass that was being followed conservatively.  She remains weak and disoriented.  She has been diuresed and is net negative 2.3L, yet she continues to have anasarca; with worsening renal function and hypotension, her diuretics have been held.  Head CT has been ordered by cardiology and PT/OT consults have been requested.  [facility] has been asked to assume care.  The patient was alert and pleasant upon my evaluation.  She had a large BM prior to our discussion.  She reports having no discomfort; no current SOB.  She is oriented to person, +/- place.  She knows that her sister has been having difficulty caring for her but is unclear if she still lives with her, "she's not doing too good herself."     Review of Systems:  ROS As per HPI otherwise 10 point review of systems negative.   This is suspect due to her dementia.  PMH, PSH, SH, and FH reviewed in Epic more than with the patient due to her dementia.   Past Medical History: Past Medical History:  Diagnosis Date    . Arthritis   . CAD (coronary artery disease) 10/31/2016   History of angioplasty while in [place]  //  Nuclear stress test 03/03/14 ([place]): Anteroseptal and apical infarct, no ischemia, EF 46  . Cancer [facility])    breast, left; s/p lumpectomy/chemo/radiation  . CAP (community acquired pneumonia) 05/02/2015  . Cardiac pacemaker in situ 02/19/2015  . Chronic systolic CHF (congestive heart failure) (HCC)    Echo 1/16: EF 50, mild MR, mild TR //Echo 1/17: EF 40-45, apical AK, grade 2 diastolic dysfunction, mild MR, mild LAE, moderate TR, PASP 44 //Echo 1/18: Moderate LVH, EF 20-25, anterior AK, moderate to severe MR, moderate LAE, PASP 34  . CKD (chronic kidney disease) 02/19/2015  . Degenerative cervical spinal stenosis 03/01/2016  . Dementia without behavioral disturbance 08/14/2015  . Depression   . Gout 02/19/2015  . H/O colon cancer, stage I 07/03/2016   s/p resection 2013 - colonic mucosa with high-grade dysplasia/adenocarcinoma in situ  . History of stroke 03/23/2015  . Hyperlipidemia   . Hypertensive heart disease with CHF (congestive heart failure) ([place]) 02/24/2015  . Lung mass 04/21/2016  . Mild mitral regurgitation 03/11/2015  . Moderate tricuspid regurgitation 03/11/2015   02/2015   . Osteoarthritis 03/27/2015  . PAD (peripheral artery disease) ([place]) 02/19/2015   S/p stent in left leg // carotid US 12/17: Bilateral ICA 1-39  . Paroxysmal atrial fibrillation (HCC)   . Pulmonary arterial  hypertension ([place]), mild 03/11/2015  . Spinal cord compression due to degenerative disorder of spinal column ([place]) 03/01/2016  . Transaminasemia   . Type 2 diabetes mellitus without complication, with long-term current use of insulin ([place]) 02/24/2015  . Vitamin B12 deficiency 07/30/2016  . 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 BREAST LUMPECTOMY Left   . CARDIAC PACEMAKER PLACEMENT    . CATARACT EXTRACTION    . EYE  SURGERY    . kidney stone removal     x3  . LAPAROSCOPIC NEPHRECTOMY Left 07/29/2015   Procedure: LEFT LAPAROSCOPIC SIMPLE NEPHRECTOMY;  Surgeon: Ardis Hughs, MD;  Location: [facility]S;  Service: Urology;  Laterality: Left;  . LUMBAR SPINE SURGERY     laminectomy and rod     Allergies:  No Known Allergies   Social History:  reports that she is a non-smoker but has been exposed to tobacco smoke. she has never used smokeless tobacco. She reports that she does not drink alcohol or use drugs.   Family History: Family History  Problem Relation Age of Onset  . Cancer Mother        pancreatic  . Diabetes Sister   . Hypertension Sister   . Asthma Sister   . Hypertension Brother       Physical Exam: Vitals:   04/18/17 1145 04/18/17 2007 04/19/17 0508 04/19/17 1237  BP: (!) 104/46 (!) 128/56 (!) 109/43 (!) 110/43  Pulse: (!) 56 (!) 56 60 76  Resp: 18 18 18 18   Temp: 98.4 F (36.9 C) 98 F (36.7 C) 98 F (36.7 C) 98 F (36.7 C)  TempSrc: Oral Oral Oral Oral  SpO2: 100% 100% 100% 100%  Weight:   73.1 kg (161 lb 2.5 oz)   Height:        Constitutional: Alert and awake, oriented x3, not in any acute distress. Eyes:  EOMI, irises appear normal, anicteric sclera,  ENMT: external ears and nose appear normal, normal hearing, Lips appear normal, oropharynx mucosa, tongue appear normal  Neck: neck appears normal, no masses, normal ROM, no thyromegaly, no JVD  CVS: S1-S2 clear, no murmur rubs or gallops Respiratory:  clear to auscultation bilaterally, no wheezing, rales or rhonchi. Respiratory effort normal. No accessory muscle use.  Abdomen: soft nontender, nondistended, normal bowel sounds, no hepatosplenomegaly, no hernias  Musculoskeletal: : 3+ UE noted on exam, appears equal and relatively symmetric.  Scant LE edema from the knees down; thigh edema is difficult to differentiate from body habitus. Neuro: Cranial nerves II-XII intact, strength, sensation, reflexes Psych:  stable mood and affect, mental status is pleasant although with apparent mild to moderate dementia Skin: no rashes or lesions or ulcers, no induration or nodules    Data reviewed:  I have personally reviewed labs and imaging studies  Pertinent Labs:   BUN 69/Creatinine 2.02/GFR 25; baseline creatinine appears to be about 1.3 Hgb 9.7   Inpatient Medications:   Scheduled Meds: . allopurinol  300 mg Oral Daily  . amiodarone  200 mg Oral Daily  . apixaban  2.5 mg Oral BID  . atorvastatin  80 mg Oral Q supper  . carvedilol  3.125 mg Oral BID WC  . donepezil  10 mg Oral QHS  . hydrALAZINE  10 mg Oral Q8H  . insulin aspart  5 Units Subcutaneous TID AC  . insulin glargine  10 Units Subcutaneous QHS  . multivitamin with minerals  1 tablet Oral Q  lunch  . sodium chloride flush  3 mL Intravenous Q12H   Continuous Infusions: . sodium chloride       Radiological Exams on Admission: Ct Head Wo Contrast  Result Date: 04/19/2017 CLINICAL DATA:  Focal neuro deficit not specified. Stroke suspected. EXAM: CT HEAD WITHOUT CONTRAST TECHNIQUE: Contiguous axial images were obtained from the base of the skull through the vertex without intravenous contrast. COMPARISON:  Brain MRI 02/21/2016 FINDINGS: Brain: No evidence of acute infarction, hemorrhage, hydrocephalus, extra-axial collection or mass lesion/mass effect. Moderate remote left occipital infarct. Remote anterior left thalamic infarct. Generalized volume loss that is mild for age. Vascular: Atherosclerotic calcification. Skull: Midline frontal bone sclerosis is stable from 2017 and considered benign. Sinuses/Orbits: Bilateral cataract resection.  No acute finding. IMPRESSION: 1. No acute finding or change from prior. 2. Remote left occipital and thalamic infarcts. Electronically Signed   By: Monte Fantasia M.D.   On: 04/19/2017 12:24    Impression/Recommendations Principal Problem:   Acute on chronic systolic heart failure (HCC) Active  Problems:   Cardiac pacemaker in situ   CKD (chronic kidney disease)   Type 2 diabetes mellitus without complication, with long-term current use of insulin (HCC)   Paroxysmal atrial fibrillation (HCC)   Dementia without behavioral disturbance   Lung mass   Edema of both upper extremities  CHF -Patient initially admitted for concern of CHF -She has known systolic CHF with EF now 54-27% and grade 2 diastolic dysfunction (0/62/37) -She has been effectively diuresed and her LE edema is essentially resolved with resultant hypotension and increasing creatinine; diuresis has been discontinued -Cardiology has reached a point of maximal effective treatment and has requested that [facility] assume care; this will take place as of tomorrow AM  Lung mass -Chest CT was ordered in 2/18 due to concerns for mass on CXR -Chest CT at that time showed a lobulated 3.8 cm mass at anterior right hilum extending into the RML - concerning for bronchogenic malignancy -The patient was seen by pulmonology on 04/21/16 and "she had already made up her mind prior to seeing me [Dr. Lake Bells that she did not want any sort of procedure."   -She was subsequently seen in the ER for R hand swelling without DVT on [place] -She followed up with Dr. Lake Bells on 6/6 and there was no change to the plan -Repeat CXR on 2/21 continues to show this apparent malignancy -There is concern about SVC syndrome and so I spoke with radiology; he recommends repeating her chest CT without contrast (due to CKD) at this time to help guide further planning -Agree that palliative care consultation in this patient is extremely appropriate  B UE edema -She had improvement in LE edema with diuresis but not improvement in UE edema -This is concerning for UE DVT/SVC syndrome given her presumed diagnosis of bronchogenic CA -After discussion with radiology, will order chest CT without contrast -While this will not allow [place] to see whether there is tumor invasion of  the SVC, it will provide some information about whether there is invasion near the apex indicating a pancoast tumor -If this is thought to be B DVT, she is already on low-dose Eliquis and this failure of AC would need careful consideration   Afib -She is rate controlled on Amiodarone and Coreg and has a pacemaker -Ongoing AC with Eliquis for now  Dementia -She has apparently had waxing and waning mental status while she has been hospitalized -Suspect that this is typical delirium and sundowning associated with  advancing dementia -At the time of my evaluation, she was quite appropriate  -Will follow  CKD -Appears to be improving but not yet back to baseline at this time   Thank you for this consultation.  Our [facility] hospitalist team will follow the patient with you today and will assume care of the patient tomorrow.   Time Spent: 33 minutes  Karmen Bongo M.D. [facility] 04/19/2017, 1:11 PM

## 2017-04-19 NOTE — Evaluation (Signed)
Occupational Therapy Evaluation Patient Details Name: Allison Dunn MRN: [id number] DOB: 1931/09/08 Today's Date: 04/19/2017    History of Present Illness Pt is an 84 y.o. female admitted 04/11/17 with CHF exacerbation. Chest CT concerning for R hilar lymphadenopathy. Pt with new onset RUE weakness 2/28; head CT shows no acute findings; remote left occipital and thalamic infarcts. Awaiting doppler to rule out BUE DVTs. PMH includes dementia, DM, CAD, DVT, CKD, a-fib, s/p pacemaker (2016), breast CA, colon CA.   Clinical Impression   Pt presents with an overall decrease in independence in ADL and functional transfers secondary to above. Pt poor historian A&Ox2, but appropriately interactive throughout session. Reports admitted from [facility] where she requires assist for transfers to/from wheelchair but was otherwise independent in ADL. Today, required maxA+2 to perform lateral scoot transfer to recliner. And total A for LB ADL (unable to don socks at all) Pt would benefit from continued acute OT services to maximize safety and independence in ADL and functional transfers prior to d/c with continued SNF-level therapies. Next session to focus on seated balance for grooming tasks and continued improvements for transfers    Follow Up Recommendations  SNF;Supervision/Assistance - 24 hour    Equipment Recommendations  Other (comment)(defer to next venue)    Recommendations for Other Services       Precautions / Restrictions Precautions Precautions: Fall Restrictions Weight Bearing Restrictions: No      Mobility Bed Mobility Overal bed mobility: Needs Assistance Bed Mobility: Supine to Sit     Supine to sit: Mod assist;+2 for physical assistance     General bed mobility comments: ModA to assist trunk elevation and assist BLEs to EOB. Pt moaning due to c/o buttocks pain upon sitting, requiring modA to maintain sitting balance  Transfers Overall transfer level: Needs  assistance Equipment used: 2 person hand held assist Transfers: Lateral/Scoot Transfers          Lateral/Scoot Transfers: Max assist;+2 physical assistance General transfer comment: MaxA+2 with HHA and use of pad to scoot from bed to drop arm chair. Pt providing very little assistance despite cues for technique to assist with BUEs and BLEs    Balance Overall balance assessment: Needs assistance Sitting-balance support: No upper extremity supported;Feet supported Sitting balance-Leahy Scale: Poor Sitting balance - Comments: Reliant on modA to maintain sitting balance Postural control: Posterior lean                                 ADL either performed or assessed with clinical judgement   ADL Overall ADL's : Needs assistance/impaired Eating/Feeding: Minimal assistance Eating/Feeding Details (indicate cue type and reason): due to swelling and pain in BUE Grooming: Moderate assistance;Sitting Grooming Details (indicate cue type and reason): decreased activity tolerance, able to bring hands to the back of her head Upper Body Bathing: Minimal assistance   Lower Body Bathing: Maximal assistance   Upper Body Dressing : Moderate assistance   Lower Body Dressing: Maximal assistance   Toilet Transfer: Maximal assistance;+2 for physical assistance;Requires drop arm(lateral transfer) Toilet Transfer Details (indicate cue type and reason): simulated through recliner transfer [place] and Hygiene: Total assistance       Functional mobility during ADLs: (NT this session - typically in [place]) General ADL Comments: Did not task Pt with much UE activity as we are awaiting DVT eval     Vision Baseline Vision/History: Wears glasses Wears Glasses: At all times Patient Visual  Report: No change from baseline Vision Assessment?: Yes Eye Alignment: Within Functional Limits Ocular Range of Motion: Within Functional Limits Alignment/Gaze Preference: Within  Defined Limits Tracking/Visual Pursuits: Able to track stimulus in all quads without difficulty Convergence: Within functional limits     Perception     Praxis      Pertinent Vitals/Pain Pain Assessment: Faces Faces Pain Scale: Hurts even more Pain Location: Buttocks Pain Descriptors / Indicators: Moaning;Sore Pain Intervention(s): Monitored during session;Repositioned;Other (comment)(RN notified to assess for pressure sore)     Hand Dominance Right   Extremity/Trunk Assessment Upper Extremity Assessment Upper Extremity Assessment: Generalized weakness;RUE deficits/detail;LUE deficits/detail RUE Deficits / Details: significant swelling throughout BUEs; R elbow 3/5, able to abduct shoulders to grossly 90' RUE Coordination: decreased fine motor;decreased gross motor LUE Deficits / Details: significant swelling throughout BUEs; L elbow 3/5, able to abduct shoulders to grossly 90' LUE Coordination: decreased fine motor;decreased gross motor   Lower Extremity Assessment Lower Extremity Assessment: Generalized weakness       Communication Communication Communication: No difficulties   Cognition Arousal/Alertness: Awake/alert Behavior During Therapy: Flat affect Overall Cognitive Status: History of cognitive impairments - at baseline Area of Impairment: Orientation;Attention;Memory;Following commands;Awareness;Problem solving                 Orientation Level: Disoriented to;Place;Situation Current Attention Level: Selective Memory: Decreased short-term memory Following Commands: Follows one step commands with increased time   Awareness: Intellectual Problem Solving: Slow processing;Difficulty sequencing;Requires verbal cues     General Comments       Exercises     Shoulder Instructions      Home Living Family/patient expects to be discharged to:: Skilled nursing facility Living Arrangements: Non-relatives/Friends                                Additional Comments: Pt poor historian. Reports from [facility]. Unsure if has been there for assisted living, or receiving SNF-level PT/OT      Prior Functioning/Environment Level of Independence: Needs assistance  Gait / Transfers Assistance Needed: Pt reports requires assist for transfers to w/c ADL's / Homemaking Assistance Needed: Pt reports she is mostly indep with ADLs (unreliable historian)            OT Problem List: Decreased activity tolerance;Impaired balance (sitting and/or standing);Decreased strength;Decreased range of motion;Decreased safety awareness;Impaired UE functional use;Pain;Increased edema      OT Treatment/Interventions: Self-care/ADL training;Manual therapy;Therapeutic activities;Patient/family education;Balance training;DME and/or AE instruction    OT Goals(Current goals can be found in the care plan section) Acute Rehab OT Goals Patient Stated Goal: Return to [facility] OT Goal Formulation: With patient Time For Goal Achievement: 05/03/17 Potential to Achieve Goals: Good ADL Goals Pt Will Perform Grooming: with min assist;sitting Pt Will Transfer to Toilet: with mod assist;squat pivot transfer Pt Will Perform Toileting - Clothing Manipulation and hygiene: with mod assist;sitting/lateral leans;with caregiver independent in assisting Additional ADL Goal #1: Pt will demonstrate bed mobility at mod A level prior to engaging in ADL activity  OT Frequency: Min 2X/week   Barriers to D/C:            Co-evaluation PT/OT/SLP Co-Evaluation/Treatment: Yes Reason for Co-Treatment: For patient/therapist safety;To address functional/ADL transfers;Necessary to address cognition/behavior during functional activity PT goals addressed during session: Mobility/safety with mobility;Balance OT goals addressed during session: Strengthening/ROM;ADL's and self-care      AM-PAC PT "6 Clicks" Daily Activity     Outcome Measure Help  from another person eating meals?: A  Lot Help from another person taking care of personal grooming?: A Lot Help from another person toileting, which includes using toliet, bedpan, or urinal?: A Lot Help from another person bathing (including washing, rinsing, drying)?: A Lot Help from another person to put on and taking off regular upper body clothing?: A Lot Help from another person to put on and taking off regular lower body clothing?: Total 6 Click Score: 11   End of Session Equipment Utilized During Treatment: Other (comment)(bed pad to assist with transfer) Nurse Communication: Mobility status;Other (comment)(transfer instructions)  Activity Tolerance: Patient tolerated treatment well Patient left: in chair;with call bell/phone within reach;with chair alarm set  OT Visit Diagnosis: Muscle weakness (generalized) (M62.81);Unsteadiness on feet (R26.81);Other abnormalities of gait and mobility (R26.89)                Time: 9417-4081 OT Time Calculation (min): 24 min Charges:  OT General Charges $OT Visit: 1 Visit OT Evaluation $OT Eval Moderate Complexity: 1 Mod G-Codes:     Hulda Humphrey OTR/L [place] 04/19/2017, 7:09 PM

## 2017-04-20 ENCOUNTER — Inpatient Hospital Stay (HOSPITAL_COMMUNITY): Payer: Medicare Other

## 2017-04-20 DIAGNOSIS — M7989 Other specified soft tissue disorders: Secondary | ICD-10-CM

## 2017-04-20 DIAGNOSIS — G301 Alzheimer's disease with late onset: Secondary | ICD-10-CM

## 2017-04-20 DIAGNOSIS — F0280 Dementia in other diseases classified elsewhere without behavioral disturbance: Secondary | ICD-10-CM

## 2017-04-20 LAB — BASIC METABOLIC PANEL
Anion gap: 11 (ref 5–15)
BUN: 71 mg/dL — ABNORMAL HIGH (ref 6–20)
CHLORIDE: 98 mmol/L — AB (ref 101–111)
CO2: 26 mmol/L (ref 22–32)
CREATININE: 2.1 mg/dL — AB (ref 0.44–1.00)
Calcium: 8.9 mg/dL (ref 8.9–10.3)
GFR, EST AFRICAN AMERICAN: 24 mL/min — AB (ref >60)
GFR, EST NON AFRICAN AMERICAN: 21 mL/min — AB (ref >60)
Glucose, Bld: 122 mg/dL — ABNORMAL HIGH (ref 65–99)
POTASSIUM: 4.6 mmol/L (ref 3.5–5.1)
SODIUM: 135 mmol/L (ref 135–145)

## 2017-04-20 LAB — GLUCOSE, CAPILLARY
GLUCOSE-CAPILLARY: 117 mg/dL — AB (ref 65–99)
GLUCOSE-CAPILLARY: 95 mg/dL (ref 65–99)
Glucose-Capillary: 113 mg/dL — ABNORMAL HIGH (ref 65–99)

## 2017-04-20 NOTE — Plan of Care (Signed)
  Progressing Safety: Ability to remain free from injury will improve 04/20/2017 0612 - Progressing by Ardine Eng, RN

## 2017-04-20 NOTE — Progress Notes (Signed)
Bilateral upper extremity venous duplex has been completed. Negative for DVT.  04/20/17 11:49 AM Carlos Levering RVT

## 2017-04-20 NOTE — Plan of Care (Signed)
Pt. SOB while at rest. Requires also staff assistance for transfers and repositioning.

## 2017-04-20 NOTE — Progress Notes (Signed)
Progress Note  Patient Name: Allison Dunn Date of Encounter: 04/20/2017  Primary Cardiologist: Cristopher Peru, MD   Subjective   Amswers simple questions, denies CP or SOB, says she does not remember yesterday, tender to palpation shoulders and arms  Inpatient Medications    Scheduled Meds: . allopurinol  300 mg Oral Daily  . amiodarone  200 mg Oral Daily  . apixaban  2.5 mg Oral BID  . atorvastatin  80 mg Oral Q supper  . carvedilol  3.125 mg Oral BID WC  . donepezil  10 mg Oral QHS  . hydrALAZINE  10 mg Oral Q8H  . insulin aspart  5 Units Subcutaneous TID AC  . multivitamin with minerals  1 tablet Oral Q lunch  . sodium chloride flush  3 mL Intravenous Q12H   Continuous Infusions: . sodium chloride     PRN Meds: sodium chloride, acetaminophen, meclizine, ondansetron (ZOFRAN) IV, sodium chloride flush   Vital Signs    Vitals:   04/19/17 1237 04/19/17 1935 04/20/17 0500 04/20/17 0841  BP: (!) 110/43 (!) 100/57 (!) 102/48 (!) 115/59  Pulse: 76 (!) 59 60 60  Resp: 18 18 18    Temp: 98 F (36.7 C) 97.6 F (36.4 C) 97.8 F (36.6 C)   TempSrc: Oral Oral Oral   SpO2: 100% 100% 98% 99%  Weight:   160 lb 7.9 oz (72.8 kg)   Height:        Intake/Output Summary (Last 24 hours) at 04/20/2017 0858 Last data filed at 04/20/2017 0500 Gross per 24 hour  Intake 460 ml  Output 900 ml  Net -440 ml   Filed Weights   04/18/17 0443 04/19/17 0508 04/20/17 0500  Weight: 162 lb 14.7 oz (73.9 kg) 161 lb 2.5 oz (73.1 kg) 160 lb 7.9 oz (72.8 kg)    Telemetry    AV pacing- Personally Reviewed  ECG    No new EKG to review - Personally Reviewed  Physical Exam   GEN: No acute distress.   Neck: JVD 10-11 cm Cardiac: RRR, soft murmur, no rubs, or gallops.  Respiratory: decreased BS w/ few rales bases bilaterally. GI: Soft, nontender, non-distended  MS: No LE edema; No deformity. L>R upper extrem edema Neuro:  Nonfocal, moves all 4 extrem but very weak, cannot eat by herself   Psych: Normal affect   Labs    Chemistry Recent Labs  Lab 04/17/17 0904 04/19/17 0854 04/20/17 0445  NA 135 134* 135  K 3.9 4.0 4.6  CL 99* 98* 98*  CO2 25 27 26   GLUCOSE 121* 72 122*  BUN 70* 69* 71*  CREATININE 2.17* 2.02* 2.10*  CALCIUM 8.7* 8.8* 8.9  GFRNONAA 20* 22* 21*  GFRAA 23* 25* 24*  ANIONGAP 11 9 11      Hematology Recent Labs  Lab 04/18/17 1048  WBC 6.9  RBC 3.42*  HGB 9.7*  HCT 29.7*  MCV 86.8  MCH 28.4  MCHC 32.7  RDW 18.3*  PLT 189    Radiology    Ct Head Wo Contrast  Result Date: 04/19/2017 CLINICAL DATA:  Focal neuro deficit not specified. Stroke suspected. EXAM: CT HEAD WITHOUT CONTRAST TECHNIQUE: Contiguous axial images were obtained from the base of the skull through the vertex without intravenous contrast. COMPARISON:  Brain MRI 02/21/2016 FINDINGS: Brain: No evidence of acute infarction, hemorrhage, hydrocephalus, extra-axial collection or mass lesion/mass effect. Moderate remote left occipital infarct. Remote anterior left thalamic infarct. Generalized volume loss that is mild for age. Vascular: Atherosclerotic  calcification. Skull: Midline frontal bone sclerosis is stable from 2017 and considered benign. Sinuses/Orbits: Bilateral cataract resection.  No acute finding. IMPRESSION: 1. No acute finding or change from prior. 2. Remote left occipital and thalamic infarcts. Electronically Signed   By: Dunn Fantasia M.D.   On: 04/19/2017 12:24   Ct Chest Wo Contrast  Result Date: 04/19/2017 CLINICAL DATA:  84 year old female with history of breast and colon cancer. Evaluate pulmonary nodule. EXAM: CT CHEST WITHOUT CONTRAST TECHNIQUE: Multidetector CT imaging of the chest was performed following the standard protocol without IV contrast. COMPARISON:  Chest CT 04/04/2016. FINDINGS: Cardiovascular: Heart size is mildly enlarged. There is no significant pericardial fluid, thickening or pericardial calcification. There is aortic atherosclerosis, as well  as atherosclerosis of the great vessels of the mediastinum and the coronary arteries, including calcified atherosclerotic plaque in the left main, left anterior descending, left circumflex and right coronary arteries. Calcifications of the aortic valve. Right-sided pacemaker with lead tips terminating in the right atrium and near the right ventricular apex. Mediastinum/Nodes: Fullness in the right hilar region likely related to underlying lymphadenopathy, but incompletely evaluated on today's noncontrast CT examination. No other mediastinal or left hilar lymphadenopathy. Esophagus is patulous and fluid-filled. No axillary lymphadenopathy. Lungs/Pleura: Significant increase in size of right upper lobe lesion which is currently a 8.6 x 5.7 cm mass (axial image 57 of series 4). Moderate to large right and moderate left pleural effusions lying dependently. Scattered areas of subsegmental atelectasis throughout the mid to lower lungs bilaterally. Mild septal thickening noted throughout the mid to upper lungs bilaterally. Upper Abdomen: Several calcified granulomas in the liver. 1.9 cm high attenuation lesion in the medial aspect of the interpolar region of the right kidney, incompletely characterized on today's noncontrast CT examination, but likely to represent a proteinaceous/hemorrhagic cysts. Aortic atherosclerosis. Musculoskeletal: There are no aggressive appearing lytic or blastic lesions noted in the visualized portions of the skeleton. IMPRESSION: 1. Interval enlargement of right upper lobe mass which currently measures 8.6 x 5.7 cm, with prominent soft tissue in the right hilar region which likely reflects underlying right hilar lymphadenopathy. 2. Moderate to large right and moderate left pleural effusion. 3. Aortic atherosclerosis, in addition to left main and 3 vessel coronary artery disease. 4. There are calcifications of the aortic valve. Echocardiographic correlation for evaluation of potential valvular  dysfunction may be warranted if clinically indicated. 5. Additional incidental findings, as above. Aortic Atherosclerosis (ICD10-I70.0). Electronically Signed   By: Vinnie Langton M.D.   On: 04/19/2017 15:18    Cardiac Studies   Echo 04/12/2017 LV EF: 30% - 35% Study Conclusions - Left ventricle: The cavity size was normal. Wall thickness was increased in a pattern of mild LVH. Systolic function was moderately to severely reduced. The estimated ejection fraction was in the range of 30% to 35%. Diffuse hypokinesis. There is akinesis of the mid-apicalanterior and apical myocardium. Features are consistent with a pseudonormal left ventricular filling pattern, with concomitant abnormal relaxation and increased filling pressure (grade 2 diastolic dysfunction). Doppler parameters are consistent with high ventricular filling pressure. - Mitral valve: Calcified annulus. There was mild regurgitation. - Left atrium: The atrium was mildly dilated. - Right ventricle: The cavity size was mildly dilated. - Right atrium: The atrium was mildly dilated. - Tricuspid valve: There was moderate regurgitation. - Pulmonary arteries: Systolic pressure was mildly to moderately increased. PA peak pressure: 51 mm Hg (S).  Impressions:  - Definity used; akinesis of the distal anterior wall and apex; overall  moderate to severe LV dysfunction; mild LVH; moderate diastolic dysfunction; mild MR; mild LAE/RAE/RVE; moderate TR with mild to moderate pulmonary hypertension.   Patient Profile     84 y.o. female  with PMH of CAD, PAF, SSS s/p PPM and lung mass presented 02/20 with anasarca.   Assessment & Plan    1. Anasarca/acute on chronic systolic HF  - Echo 0/62/3762 EF 30-35%, grade 2 DD - extremely weak but moving a little more. Still does not have enough upper body strength and coordination to feed herself - diuretics on hold for volume depletion, Pleural effusions seen  on CT, but BP down and Cr up>>diuretics held - creatinine improved some, still elevated - BUN still elevated - BP improved on decreased Hydralazine, now 100s-110s, but hydralazine held this am for SBP < 105 - continue carvedilol 3.125mg  BID, no doses missed  2. CAD - denies CP - continue BB and high dose statin - no ASA due to NOAC  3. PAF - was Wenkebach, now AV pacing - continue BB, Amio and low dose apixaban for elevated creatinine and advanced age  61. SSS s/p PPM -  Programming changed, device now pacing all the time  5. Lung mass -  palliative care to follow at [facility]  6. CKD stage IV - creatinine improved some with holding diuretics  - creatinine 1.16 last fall and 2.38 this admit - Cr still > 2  Appreciate IM Consult we will continue to follow for cardiac issues.   For questions or updates, please contact [place] Please consult [email or web address] for contact info under Cardiology/STEMI.      Signed, Rosaria Ferries, PA-C  04/20/2017, 8:58 AM

## 2017-04-20 NOTE — Progress Notes (Signed)
PROGRESS NOTE    BAELYN DORING  EXN:[id number] DOB: 08-31-1931 DOA: 04/11/2017 PCP: Hennie Duos, MD      Brief Narrative:  Mrs. Gritton is a 84 yo F with dementia, SNF-dwelling, chronic systolic CHF EF 49%, DM, pAF on Eliquis, pacer, CKD baseline Cr now 2, and known lung mass that is being followed without biopsy or treatment who presents with fluid overload.  Diuresis now complicated by worsening renal function.   Assessment & Plan:  Principal Problem:   Acute on chronic systolic heart failure (HCC) Active Problems:   Cardiac pacemaker in situ   CKD (chronic kidney disease)   Type 2 diabetes mellitus without complication, with long-term current use of insulin (HCC)   Paroxysmal atrial fibrillation (HCC)   Dementia without behavioral disturbance   Lung mass   Edema of both upper extremities   Acute on chronic systolic and diastolic CHF EF 67-59%, grade 2 diastolic dysfunction.  Has been diuresed to the limit that we can. -Defer diuretics to Cardiology -I/Os -Daily BMP -Continue hydralazine, BB   Anasarca This was presenting complaint, and persists.  She has striking upper extremity edema bilaterally, very little lower extremity edema.  CT chest yesterday showed no clear SVC syndrome, dopplers pending.  Albumin 2.6 this hospitalization. -Follow up US  Lung mass 3.8 cm on discovery last year, now 8.6cm on CT yesterday.  Workup has been deferred.  Now that mass has more than doubled in size, further workup would be futile.  I would recommend Hospice care.  Will discuss with family and Palliative Care.  Pleural effusions Likely malignant.  Chronic kidney disease Baseline Cr >2 this year.  Atrial Fibrillation CHADs2Vasc 4.   -Continue Eliquis -Continue amiodarone, BB  Diabetes -Hold Lantus -Mealtime insulin  Dementia -Continue Aricept Delirium precautions:   -Lights and TV off, minimize interruptions at night  -Blinds open and lights on during  day  -Glasses/hearing aid with patient  -Frequent reorientation  -PT/OT when able  -Avoid sedation medications/Beers list medications   Other medications -Continue allopurinol           DVT prophylaxis: Apixaban Code Status: FULL Family Communication: Sister by phone Disposition Plan: Finish work up for SVC syndrome with Dopplers.  Pending that work up, likely will go home with hospice, expect prognosis less than 2-3 months.   Consultants:   Cardiology  Palliative Care   Antimicrobials:   None    Subjective: Disoriented.  No fever overnight.  Swleling still bad.  No new cough, dyspnea.  No change in mental status.  More weak.  Objective: Vitals:   04/19/17 1237 04/19/17 1935 04/20/17 0500 04/20/17 0841  BP: (!) 110/43 (!) 100/57 (!) 102/48 (!) 115/59  Pulse: 76 (!) 59 60 60  Resp: 18 18 18    Temp: 98 F (36.7 C) 97.6 F (36.4 C) 97.8 F (36.6 C)   TempSrc: Oral Oral Oral   SpO2: 100% 100% 98% 99%  Weight:   72.8 kg (160 lb 7.9 oz)   Height:        Intake/Output Summary (Last 24 hours) at 04/20/2017 1638 Last data filed at 04/20/2017 0500 Gross per 24 hour  Intake 460 ml  Output 900 ml  Net -440 ml   Filed Weights   04/18/17 0443 04/19/17 0508 04/20/17 0500  Weight: 73.9 kg (162 lb 14.7 oz) 73.1 kg (161 lb 2.5 oz) 72.8 kg (160 lb 7.9 oz)    Examination: General appearance: Elderly obese adult female, interactive but confused.  HEENT: Anicteric, conjunctiva pink, lids and lashes normal. No nasal deformity, discharge, epistaxis.  Lips moist.  Mild facial puffiness.   Skin: Warm and dry.  No suspicious rashes or lesions. Cardiac: Tachycardic, regular, nl S1-S2, no murmurs appreciated.  1+ left edema, no right leg edema.  Both arms pitt 2-3+. Respiratory: Normal respiratory rate and rhythm.  No wheezes, rales in bases. Abdomen: Abdomen soft.  No TTP. MSK: No deformities or effusions. Neuro: Awake and but oriented only to self and hospital.  Upper  extremity strength equal but only barely able to oppose gravity. Speech fluent.    Psych: Sensorium intact and responding to questions, attention diminshed, very disoriented.    Data Reviewed: I have personally reviewed following labs and imaging studies:  CBC: Recent Labs  Lab 04/18/17 1048  WBC 6.9  NEUTROABS 5.3  HGB 9.7*  HCT 29.7*  MCV 86.8  PLT 789   Basic Metabolic Panel: Recent Labs  Lab 04/15/17 0408 04/16/17 0558 04/17/17 0904 04/19/17 0854 04/20/17 0445  NA 135 134* 135 134* 135  K 3.9 4.0 3.9 4.0 4.6  CL 99* 97* 99* 98* 98*  CO2 26 25 25 27 26   GLUCOSE 79 102* 121* 72 122*  BUN 65* 68* 70* 69* 71*  CREATININE 2.26* 2.30* 2.17* 2.02* 2.10*  CALCIUM 8.8* 9.0 8.7* 8.8* 8.9   GFR: Estimated Creatinine Clearance: 18.1 mL/min (A) (by C-G formula based on SCr of 2.1 mg/dL (H)). Liver Function Tests: No results for input(s): AST, ALT, ALKPHOS, BILITOT, PROT, ALBUMIN in the last 168 hours. No results for input(s): LIPASE, AMYLASE in the last 168 hours. No results for input(s): AMMONIA in the last 168 hours. Coagulation Profile: No results for input(s): INR, PROTIME in the last 168 hours. Cardiac Enzymes: No results for input(s): CKTOTAL, CKMB, CKMBINDEX, TROPONINI in the last 168 hours. BNP (last 3 results) Recent Labs    10/31/16 1529  PROBNP 2,831*   HbA1C: No results for input(s): HGBA1C in the last 72 hours. CBG: Recent Labs  Lab 04/19/17 0642 04/19/17 0722 04/19/17 1635 04/19/17 2058 04/20/17 0737  GLUCAP 166* 92 98 126* 117*   Lipid Profile: No results for input(s): CHOL, HDL, LDLCALC, TRIG, CHOLHDL, LDLDIRECT in the last 72 hours. Thyroid Function Tests: No results for input(s): TSH, T4TOTAL, FREET4, T3FREE, THYROIDAB in the last 72 hours. Anemia Panel: No results for input(s): VITAMINB12, FOLATE, FERRITIN, TIBC, IRON, RETICCTPCT in the last 72 hours. Urine analysis:    Component Value Date/Time   COLORURINE YELLOW 02/19/2016 1330    APPEARANCEUR CLEAR 02/19/2016 1330   LABSPEC 1.009 02/19/2016 1330   PHURINE 7.0 02/19/2016 1330   GLUCOSEU NEGATIVE 02/19/2016 1330   HGBUR SMALL (A) 02/19/2016 1330   BILIRUBINUR NEGATIVE 02/19/2016 1330   KETONESUR NEGATIVE 02/19/2016 1330   PROTEINUR NEGATIVE 02/19/2016 1330   NITRITE NEGATIVE 02/19/2016 1330   LEUKOCYTESUR SMALL (A) 02/19/2016 1330   Sepsis Labs: @LABRCNTIP (procalcitonin:4,lacticacidven:4)  ) Recent Results (from the past 240 hour(s))  MRSA PCR Screening     Status: None   Collection Time: 04/11/17  8:15 PM  Result Value Ref Range Status   MRSA by PCR NEGATIVE NEGATIVE Final    Comment:        The GeneXpert MRSA Assay (FDA approved for NASAL specimens only), is one component of a comprehensive MRSA colonization surveillance program. It is not intended to diagnose MRSA infection nor to guide or monitor treatment for MRSA infections. Performed at [facility], [address] [place],  [place] [address]          Radiology Studies: Ct Head Wo Contrast  Result Date: 04/19/2017 CLINICAL DATA:  Focal neuro deficit not specified. Stroke suspected. EXAM: CT HEAD WITHOUT CONTRAST TECHNIQUE: Contiguous axial images were obtained from the base of the skull through the vertex without intravenous contrast. COMPARISON:  Brain MRI 02/21/2016 FINDINGS: Brain: No evidence of acute infarction, hemorrhage, hydrocephalus, extra-axial collection or mass lesion/mass effect. Moderate remote left occipital infarct. Remote anterior left thalamic infarct. Generalized volume loss that is mild for age. Vascular: Atherosclerotic calcification. Skull: Midline frontal bone sclerosis is stable from 2017 and considered benign. Sinuses/Orbits: Bilateral cataract resection.  No acute finding. IMPRESSION: 1. No acute finding or change from prior. 2. Remote left occipital and thalamic infarcts. Electronically Signed   By: Monte Fantasia M.D.   On: 04/19/2017 12:24   Ct Chest Wo  Contrast  Result Date: 04/19/2017 CLINICAL DATA:  84 year old female with history of breast and colon cancer. Evaluate pulmonary nodule. EXAM: CT CHEST WITHOUT CONTRAST TECHNIQUE: Multidetector CT imaging of the chest was performed following the standard protocol without IV contrast. COMPARISON:  Chest CT 04/04/2016. FINDINGS: Cardiovascular: Heart size is mildly enlarged. There is no significant pericardial fluid, thickening or pericardial calcification. There is aortic atherosclerosis, as well as atherosclerosis of the great vessels of the mediastinum and the coronary arteries, including calcified atherosclerotic plaque in the left main, left anterior descending, left circumflex and right coronary arteries. Calcifications of the aortic valve. Right-sided pacemaker with lead tips terminating in the right atrium and near the right ventricular apex. Mediastinum/Nodes: Fullness in the right hilar region likely related to underlying lymphadenopathy, but incompletely evaluated on today's noncontrast CT examination. No other mediastinal or left hilar lymphadenopathy. Esophagus is patulous and fluid-filled. No axillary lymphadenopathy. Lungs/Pleura: Significant increase in size of right upper lobe lesion which is currently a 8.6 x 5.7 cm mass (axial image 57 of series 4). Moderate to large right and moderate left pleural effusions lying dependently. Scattered areas of subsegmental atelectasis throughout the mid to lower lungs bilaterally. Mild septal thickening noted throughout the mid to upper lungs bilaterally. Upper Abdomen: Several calcified granulomas in the liver. 1.9 cm high attenuation lesion in the medial aspect of the interpolar region of the right kidney, incompletely characterized on today's noncontrast CT examination, but likely to represent a proteinaceous/hemorrhagic cysts. Aortic atherosclerosis. Musculoskeletal: There are no aggressive appearing lytic or blastic lesions noted in the visualized portions  of the skeleton. IMPRESSION: 1. Interval enlargement of right upper lobe mass which currently measures 8.6 x 5.7 cm, with prominent soft tissue in the right hilar region which likely reflects underlying right hilar lymphadenopathy. 2. Moderate to large right and moderate left pleural effusion. 3. Aortic atherosclerosis, in addition to left main and 3 vessel coronary artery disease. 4. There are calcifications of the aortic valve. Echocardiographic correlation for evaluation of potential valvular dysfunction may be warranted if clinically indicated. 5. Additional incidental findings, as above. Aortic Atherosclerosis (ICD10-I70.0). Electronically Signed   By: Vinnie Langton M.D.   On: 04/19/2017 15:18        Scheduled Meds: . allopurinol  300 mg Oral Daily  . amiodarone  200 mg Oral Daily  . apixaban  2.5 mg Oral BID  . atorvastatin  80 mg Oral Q supper  . carvedilol  3.125 mg Oral BID WC  . donepezil  10 mg Oral QHS  . hydrALAZINE  10 mg Oral Q8H  . insulin aspart  5 Units Subcutaneous  TID AC  . multivitamin with minerals  1 tablet Oral Q lunch  . sodium chloride flush  3 mL Intravenous Q12H   Continuous Infusions: . sodium chloride       LOS: 9 days    Time spent: 45 minutes    Edwin Dada, MD [facility] 04/20/2017, 9:52 AM     Pager [phone] --- please page though AMION:  [email or web address] If 7PM-7AM, please contact night-coverage

## 2017-04-20 NOTE — Clinical Social Work Note (Signed)
CSW continues to follow for discharge needs.  Dayton Scrape, [place]

## 2017-04-20 NOTE — Progress Notes (Signed)
Pt had quiet night.  No signs distress to report.  CBG hs 126 no coverage required.  Pt is stable.  Bed at lowest position for safety.  Alarm set per protocol

## 2017-04-21 ENCOUNTER — Emergency Department (HOSPITAL_COMMUNITY)
Admission: EM | Admit: 2017-04-21 | Discharge: 2017-05-21 | Disposition: E | Payer: Medicare Other | Attending: Emergency Medicine | Admitting: Emergency Medicine

## 2017-04-21 DIAGNOSIS — Z794 Long term (current) use of insulin: Secondary | ICD-10-CM | POA: Insufficient documentation

## 2017-04-21 DIAGNOSIS — N189 Chronic kidney disease, unspecified: Secondary | ICD-10-CM | POA: Insufficient documentation

## 2017-04-21 DIAGNOSIS — F0390 Unspecified dementia without behavioral disturbance: Secondary | ICD-10-CM | POA: Insufficient documentation

## 2017-04-21 DIAGNOSIS — E119 Type 2 diabetes mellitus without complications: Secondary | ICD-10-CM

## 2017-04-21 DIAGNOSIS — Z950 Presence of cardiac pacemaker: Secondary | ICD-10-CM | POA: Insufficient documentation

## 2017-04-21 DIAGNOSIS — Z7901 Long term (current) use of anticoagulants: Secondary | ICD-10-CM | POA: Insufficient documentation

## 2017-04-21 DIAGNOSIS — N183 Chronic kidney disease, stage 3 (moderate): Secondary | ICD-10-CM

## 2017-04-21 DIAGNOSIS — I469 Cardiac arrest, cause unspecified: Secondary | ICD-10-CM

## 2017-04-21 DIAGNOSIS — Z853 Personal history of malignant neoplasm of breast: Secondary | ICD-10-CM | POA: Insufficient documentation

## 2017-04-21 DIAGNOSIS — E1122 Type 2 diabetes mellitus with diabetic chronic kidney disease: Secondary | ICD-10-CM | POA: Diagnosis not present

## 2017-04-21 DIAGNOSIS — Z7722 Contact with and (suspected) exposure to environmental tobacco smoke (acute) (chronic): Secondary | ICD-10-CM | POA: Diagnosis not present

## 2017-04-21 DIAGNOSIS — I2510 Atherosclerotic heart disease of native coronary artery without angina pectoris: Secondary | ICD-10-CM | POA: Diagnosis not present

## 2017-04-21 DIAGNOSIS — Z79899 Other long term (current) drug therapy: Secondary | ICD-10-CM | POA: Insufficient documentation

## 2017-04-21 LAB — BASIC METABOLIC PANEL
ANION GAP: 11 (ref 5–15)
BUN: 77 mg/dL — AB (ref 6–20)
CHLORIDE: 98 mmol/L — AB (ref 101–111)
CO2: 24 mmol/L (ref 22–32)
Calcium: 8.9 mg/dL (ref 8.9–10.3)
Creatinine, Ser: 2.18 mg/dL — ABNORMAL HIGH (ref 0.44–1.00)
GFR calc Af Amer: 23 mL/min — ABNORMAL LOW (ref >60)
GFR calc non Af Amer: 20 mL/min — ABNORMAL LOW (ref >60)
GLUCOSE: 106 mg/dL — AB (ref 65–99)
POTASSIUM: 5 mmol/L (ref 3.5–5.1)
Sodium: 133 mmol/L — ABNORMAL LOW (ref 135–145)

## 2017-04-21 LAB — GLUCOSE, CAPILLARY
Glucose-Capillary: 127 mg/dL — ABNORMAL HIGH (ref 65–99)
Glucose-Capillary: 137 mg/dL — ABNORMAL HIGH (ref 65–99)

## 2017-04-21 LAB — CBG MONITORING, ED: GLUCOSE-CAPILLARY: 81 mg/dL (ref 65–99)

## 2017-04-21 MED ORDER — EPINEPHRINE PF 1 MG/10ML IJ SOSY
PREFILLED_SYRINGE | INTRAMUSCULAR | Status: AC | PRN
  Administered 2017-04-21: 1 via INTRAVENOUS

## 2017-04-21 MED ORDER — CALCIUM CHLORIDE 10 % IV SOLN
INTRAVENOUS | Status: AC | PRN
  Administered 2017-04-21: 100 mg via INTRAVENOUS

## 2017-04-23 LAB — GLUCOSE, CAPILLARY: GLUCOSE-CAPILLARY: 97 mg/dL (ref 65–99)

## 2017-04-23 MED FILL — Medication: Qty: 1 | Status: AC

## 2017-05-18 ENCOUNTER — Encounter: Payer: Medicare Other | Admitting: Internal Medicine

## 2017-05-18 ENCOUNTER — Other Ambulatory Visit: Payer: Self-pay | Admitting: Internal Medicine

## 2017-05-21 NOTE — ED Triage Notes (Signed)
Per [facility] pt was being taken to [place] home from a floor here when the pt suddently went unresponsive and CPR was started at 1645. At 1718 pt went into vfib, received one shock, then pea since and PEA on arrival. Pt received 300 amio and 7 epis in route. Pt had ETT in place but it was dislodged in transported and has a king airway in now. Pt on lucas, being bagged, zoll in place, unresponsive, pulseless, apneic on arrival.

## 2017-05-21 NOTE — ED Notes (Signed)
Time of Death 60 announced by Dr Ashok Cordia.

## 2017-05-21 NOTE — Clinical Social Work Note (Signed)
Clinical Social Worker facilitated patient discharge including contacting patient family and facility to confirm patient discharge plans.  Clinical information faxed to facility and family agreeable with plan.  CSW arranged ambulance transport via [facility] to [facility].  RN to call report prior to discharge.  Clinical Social Worker will sign off for now as social work intervention is no longer needed. Please consult [place] again if new need arises.  Barbette Or, [place]

## 2017-05-21 NOTE — ED Provider Notes (Signed)
[facility] EMERGENCY DEPARTMENT Provider Note   CSN: [id number] Arrival date & time: 2017/05/21  1730     History   Chief Complaint Chief Complaint  Patient presents with  . Cardiac Arrest    HPI ANTIONETTA ATOR is a 84 y.o. female.  Patient was being transported to residence after inpatient stay when she became unresponsive.  CPR was started by EMS. Patient in cardiorespiratory arrest - level 5 caveat.  On review of recent admission, pt with progressive/worsening lung mass, and generalized weakness. Att D/c palliative/hospice care considered, and note on chart of DNR.  EMS indicates that CPR ongoing for 45 minutes, King airway in place, bag ventilated, compressions via [place]. Throughout period of CPR pt has been pulseless, with no response to stimuli, w occasional agonal respiration.  Pt has received epi x 7, and amildarone 300 mg, glucose reported normal.    The history is provided by the EMS personnel. The history is limited by the condition of the patient.    Past Medical History:  Diagnosis Date  . Arthritis   . CAD (coronary artery disease) 10/31/2016   History of angioplasty while in [place]  //  Nuclear stress test 03/03/14 ([place]): Anteroseptal and apical infarct, no ischemia, EF 46  . Cancer [facility])    breast, left; s/p lumpectomy/chemo/radiation  . CAP (community acquired pneumonia) 05/02/2015  . Cardiac pacemaker in situ 02/19/2015  . Chronic systolic CHF (congestive heart failure) (HCC)    Echo 1/16: EF 50, mild MR, mild TR //Echo 1/17: EF 40-45, apical AK, grade 2 diastolic dysfunction, mild MR, mild LAE, moderate TR, PASP 44 //Echo 1/18: Moderate LVH, EF 20-25, anterior AK, moderate to severe MR, moderate LAE, PASP 34  . CKD (chronic kidney disease) 02/19/2015  . Degenerative cervical spinal stenosis 03/01/2016  . Dementia without behavioral disturbance 08/14/2015  . Depression   . Gout 02/19/2015  . H/O colon cancer, stage I 07/03/2016   s/p  resection 2013 - colonic mucosa with high-grade dysplasia/adenocarcinoma in situ  . History of stroke 03/23/2015  . Hyperlipidemia   . Hypertensive heart disease with CHF (congestive heart failure) ([place]) 02/24/2015  . Lung mass 04/21/2016  . Mild mitral regurgitation 03/11/2015  . Moderate tricuspid regurgitation 03/11/2015   02/2015   . Osteoarthritis 03/27/2015  . PAD (peripheral artery disease) ([place]) 02/19/2015   S/p stent in left leg // carotid US 12/17: Bilateral ICA 1-39  . Paroxysmal atrial fibrillation (HCC)   . Pulmonary arterial hypertension ([place]), mild 03/11/2015  . Spinal cord compression due to degenerative disorder of spinal column ([place]) 03/01/2016  . Transaminasemia   . Type 2 diabetes mellitus without complication, with long-term current use of insulin ([place]) 02/24/2015  . Vitamin B12 deficiency 07/30/2016  . 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 Edema of both upper extremities   . Goals of care, counseling/discussion   . Palliative care by specialist   . Sinus node dysfunction ([place]) 02/18/2017  . Acute on chronic systolic heart failure ([place]) 10/31/2016  . CAD (coronary artery disease) 10/31/2016  . Vitamin B12 deficiency 07/30/2016  . H/O colon cancer, stage I 07/03/2016  . Lung mass 04/21/2016  . Degenerative cervical spinal stenosis 03/01/2016  . Spinal cord compression due to degenerative disorder of spinal column ([place]) 03/01/2016  . Hypokalemia 03/01/2016  . Myelopathy ([place])   . Generalized weakness 02/19/2016  . Diaphragmatic injury as surgical complication 51/76/1607  .  Postoperative anemia due to acute blood loss 08/14/2015  . Dementia without behavioral disturbance 08/14/2015  . Chronic UTI 07/29/2015  . 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 Klebsiella sepsis ([place]) 05/02/2015  . CAP (community acquired pneumonia) 05/02/2015  . Transaminasemia   . Sepsis ([place]) 04/22/2015  . Paroxysmal  atrial fibrillation ([place]) 04/22/2015  . Delirium   . Urinary tract infectious disease   . Osteoarthritis 03/27/2015  . Cerebrovascular accident (CVA) due to thrombosis of cerebral artery ([place]) 03/23/2015  . Moderate tricuspid regurgitation 03/11/2015  . Dilated cardiomyopathy ([place]) 03/11/2015  . Mitral regurgitation 03/11/2015  . Pulmonary arterial hypertension ([place]), mild 03/11/2015  . Mild cognitive impairment 02/24/2015  . Type 2 diabetes mellitus without complication, with long-term current use of insulin ([place]) 02/24/2015  . Hypertensive heart disease with CHF (congestive heart failure) ([place]) 02/24/2015  . Hyperlipidemia 02/19/2015  . Gout 02/19/2015  . PAD (peripheral artery disease) ([place]) 02/19/2015  . Cardiac pacemaker in situ 02/19/2015  . CKD (chronic kidney disease) 02/19/2015    Past Surgical History:  Procedure Laterality Date  . BREAST LUMPECTOMY Left   . CARDIAC PACEMAKER PLACEMENT    . CATARACT EXTRACTION    . EYE SURGERY    . kidney stone removal     x3  . LAPAROSCOPIC NEPHRECTOMY Left 07/29/2015   Procedure: LEFT LAPAROSCOPIC SIMPLE NEPHRECTOMY;  Surgeon: Ardis Hughs, MD;  Location: [facility] ORS;  Service: Urology;  Laterality: Left;  . LUMBAR SPINE SURGERY     laminectomy and rod    OB History    No data available       Home Medications    Prior to Admission medications   Medication Sig Start Date End Date Taking? Authorizing Provider  acetaminophen (TYLENOL) 325 MG tablet Take 650 mg by mouth every 6 (six) hours as needed.    [provider]  allopurinol (ZYLOPRIM) 300 MG tablet TAKE ONE TABLET BY MOUTH ONCE DAILY Patient taking differently: TAKE ONE TABLET (300mg ) BY MOUTH ONCE DAILY 12/27/15   Binnie Rail, MD  amiodarone (PACERONE) 200 MG tablet Take 1 tablet (200 mg total) by mouth daily. 09/03/15   Binnie Rail, MD  apixaban (ELIQUIS) 2.5 MG TABS tablet Take 2.5 mg by mouth 2 (two) times daily.    [provider]  atorvastatin  (LIPITOR) 80 MG tablet Take 1 tablet (80 mg total) by mouth daily with supper. 09/03/15   Binnie Rail, MD  carvedilol (COREG) 3.125 MG tablet Take 1 tablet (3.125 mg total) by mouth 2 (two) times daily with a meal. 11/14/16   Weaver, Scott T, PA-C  donepezil (ARICEPT) 10 MG tablet Take 1 tablet (10 mg total) by mouth at bedtime. 10/19/15   Cameron Sprang, MD  furosemide (LASIX) 80 MG tablet Take 80 mg by mouth daily.    [provider]  HUMALOG KWIKPEN 100 UNIT/ML KiwkPen Inject 5 Units into the skin 3 (three) times daily before meals. When CBG is greater than 200; inject 8 units with a CBG greater than 300 05/09/16   [provider]  hydrALAZINE (APRESOLINE) 100 MG tablet Take 100 mg by mouth 3 (three) times daily.    [provider]  meclizine (ANTIVERT) 25 MG tablet Take 25 mg by mouth 3 (three) times daily as needed for dizziness.    [provider]  Multiple Vitamins-Minerals (WOMENS MULTI VITAMIN & MINERAL PO) Take 1 tablet by mouth daily with lunch.     [provider]    [facility]  History Family History  Problem Relation Age of Onset  . Cancer Mother        pancreatic  . Diabetes Sister   . Hypertension Sister   . Asthma Sister   . Hypertension Brother     Social History Social History   Tobacco Use  . Smoking status: Passive Smoke Exposure - Never Smoker  . Smokeless tobacco: Never Used  . Tobacco comment: pt's spouse was heavy smoker, smoked in home.   Substance Use Topics  . Alcohol use: No    Alcohol/week: 0.0 oz  . Drug use: No     Allergies   Patient has no known allergies.   Review of Systems Review of Systems  Unable to perform ROS: Patient unresponsive  level 5 caveat, cpr.    Physical Exam Updated Vital Signs Wt 74.4 kg (164 lb)   LMP  (LMP Unknown)   BMI 32.03 kg/m   Physical Exam  Constitutional: She appears distressed.  Pt weak, frail appearing. cpr in progress.   HENT:  Airway tube in place.     Eyes: Conjunctivae are normal. No scleral icterus.  Pupils fixed, unresponsive.   Neck: No tracheal deviation present.  No pulses.   Cardiovascular:  No cardiac sounds. No pulses.   Pulmonary/Chest:  Bag ventilated w bil bs.   Abdominal: Soft. Normal appearance.  Musculoskeletal: She exhibits no deformity.  Neurological:  Unresponsive. No response to stimuli. Absent corneal reflex.   Skin: Skin is warm and dry. No rash noted.  Psychiatric:  Unresponsive.   Nursing note and vitals reviewed.    ED Treatments / Results  Labs (all labs ordered are listed, but only abnormal results are displayed) Results for orders placed or performed during the hospital encounter of 05-01-2017  CBG monitoring, ED  Result Value Ref Range   Glucose-Capillary 81 65 - 99 mg/dL   Ct Head Wo Contrast  Result Date: 04/19/2017 CLINICAL DATA:  Focal neuro deficit not specified. Stroke suspected. EXAM: CT HEAD WITHOUT CONTRAST TECHNIQUE: Contiguous axial images were obtained from the base of the skull through the vertex without intravenous contrast. COMPARISON:  Brain MRI 02/21/2016 FINDINGS: Brain: No evidence of acute infarction, hemorrhage, hydrocephalus, extra-axial collection or mass lesion/mass effect. Moderate remote left occipital infarct. Remote anterior left thalamic infarct. Generalized volume loss that is mild for age. Vascular: Atherosclerotic calcification. Skull: Midline frontal bone sclerosis is stable from 2017 and considered benign. Sinuses/Orbits: Bilateral cataract resection.  No acute finding. IMPRESSION: 1. No acute finding or change from prior. 2. Remote left occipital and thalamic infarcts. Electronically Signed   By: Monte Fantasia M.D.   On: 04/19/2017 12:24   Ct Chest Wo Contrast  Result Date: 04/19/2017 CLINICAL DATA:  84 year old female with history of breast and colon cancer. Evaluate pulmonary nodule. EXAM: CT CHEST WITHOUT CONTRAST TECHNIQUE: Multidetector CT imaging of the chest  was performed following the standard protocol without IV contrast. COMPARISON:  Chest CT 04/04/2016. FINDINGS: Cardiovascular: Heart size is mildly enlarged. There is no significant pericardial fluid, thickening or pericardial calcification. There is aortic atherosclerosis, as well as atherosclerosis of the great vessels of the mediastinum and the coronary arteries, including calcified atherosclerotic plaque in the left main, left anterior descending, left circumflex and right coronary arteries. Calcifications of the aortic valve. Right-sided pacemaker with lead tips terminating in the right atrium and near the right ventricular apex. Mediastinum/Nodes: Fullness in the right hilar region likely related to underlying lymphadenopathy, but incompletely evaluated on today's noncontrast CT examination. No  other mediastinal or left hilar lymphadenopathy. Esophagus is patulous and fluid-filled. No axillary lymphadenopathy. Lungs/Pleura: Significant increase in size of right upper lobe lesion which is currently a 8.6 x 5.7 cm mass (axial image 57 of series 4). Moderate to large right and moderate left pleural effusions lying dependently. Scattered areas of subsegmental atelectasis throughout the mid to lower lungs bilaterally. Mild septal thickening noted throughout the mid to upper lungs bilaterally. Upper Abdomen: Several calcified granulomas in the liver. 1.9 cm high attenuation lesion in the medial aspect of the interpolar region of the right kidney, incompletely characterized on today's noncontrast CT examination, but likely to represent a proteinaceous/hemorrhagic cysts. Aortic atherosclerosis. Musculoskeletal: There are no aggressive appearing lytic or blastic lesions noted in the visualized portions of the skeleton. IMPRESSION: 1. Interval enlargement of right upper lobe mass which currently measures 8.6 x 5.7 cm, with prominent soft tissue in the right hilar region which likely reflects underlying right hilar  lymphadenopathy. 2. Moderate to large right and moderate left pleural effusion. 3. Aortic atherosclerosis, in addition to left main and 3 vessel coronary artery disease. 4. There are calcifications of the aortic valve. Echocardiographic correlation for evaluation of potential valvular dysfunction may be warranted if clinically indicated. 5. Additional incidental findings, as above. Aortic Atherosclerosis (ICD10-I70.0). Electronically Signed   By: Vinnie Langton M.D.   On: 04/19/2017 15:18   Dg Chest Port 1 View  Result Date: 04/12/2017 CLINICAL DATA:  Shortness of breath. EXAM: PORTABLE CHEST 1 VIEW COMPARISON:  Radiographs of July 26, 2016. FINDINGS: Stable cardiomediastinal silhouette. Atherosclerosis of thoracic aorta is noted. No pneumothorax is noted. Right-sided pacemaker is unchanged in position. No significant pleural effusion is noted. Probable scarring or atelectasis is noted in the lingular segment of left upper lobe. Enlarged right perihilar density is noted most consistent with neoplasm or malignancy and probable postobstructive atelectasis or infiltrate. Bony thorax is unremarkable. IMPRESSION: Enlarged right perihilar density is noted consistent with neoplasm or malignancy and associated postobstructive atelectasis or infiltrate. Aortic Atherosclerosis (ICD10-I70.0). Electronically Signed   By: Marijo Conception, M.D.   On: 04/12/2017 08:00    EKG  EKG Interpretation  Date/Time:  17-May-2017 18:04:38 EST Ventricular Rate:  0 PR Interval:    QRS Duration:   QT Interval:    QTC Calculation:   R Axis:   0 Text Interpretation:  Asystole, no capture with electronic pacemaker Confirmed by Daleen Bo [phone]) on 04/22/2017 11:36:32 AM       Radiology No results found.  Procedures Procedures (including critical care time)  Medications Ordered in ED Medications  EPINEPHrine (ADRENALIN) 1 MG/10ML injection (1 Syringe Intravenous Given 05-17-17 1735)  calcium chloride  injection (100 mg Intravenous Given 2017-05-17 1737)     Initial Impression / Assessment and Plan / ED Course  I have reviewed the triage vital signs and the nursing notes.  Pertinent labs & imaging results that were available during my care of the patient were reviewed by me and considered in my medical decision making (see chart for details).  CPR continued on arrival to ED. Epi iv.   Patient remains pulseless.  Rare electrical activity on monitor, and occasional agonal resp.   Reviewed nursing notes and prior charts for additional history.   After prolonged cpr, no pulses, no neurologic response patient pronounced dead. On bedside u/s, no contractility/CO.   Family notified by nursing staff.     Final Clinical Impressions(s) / ED Diagnoses   Final diagnoses:  None  ED Discharge Orders    None       Lajean Saver, MD 04/24/17 1344

## 2017-05-21 NOTE — Code Documentation (Signed)
King airway removed by Dr Ashok Cordia.

## 2017-05-21 NOTE — Discharge Summary (Signed)
Physician Discharge Summary  BERLIN VIERECK RJJ:[id number] DOB: 08/17/1931 DOA: 04/11/2017  PCP: Hennie Duos, MD  Admit date: 04/11/2017 Discharge date: 2017/05/12  Admitted From: [facility]  Disposition:  SNF   Recommendations for Outpatient Follow-up:  Please make referral to Hospice this weekend   Home Health: N/A  Equipment/Devices: None  Discharge Condition: Poor  CODE STATUS: DO NOT RESUSCITATE Diet recommendation: Regular  Brief/Interim Summary: Mrs. Bocchino is an 84 yo F with CHF EF 35%, pacer in place, pAF on Eliquis, DM and CKD baseline Cr 1.8 who presents with anasarca, failed diuresis and found to have worsening of her presumed lung cancer.     Anasarca Arm swelling The patient was admitted by the Cardiology service, her ACE/ARB was stopped and diuretics were started.  She had worsening of her renal function with even slight negative I/Os, and diuretics were stopped. Her albumin was noted to be low.  SVC syndrome was ruled out (good phasic venous flow in bilateral subclav/axillary veins).  Cardiology recommended no further attempts at diuresis.  Lung cancer, presumed Likely malignant pleural effusions The patient was first diagnosed with right upper lung mass one year ago, declined biopsy or treatment.  CT during this hospitalization showed more than doubling of the size of the mass in last 12 months, without clear compression of the SVC, or mets.    Delirium Metabolic encephalopathy The patient was persistently delirious during her hospitalization. This was felt to be from progression of her cancer/catabolism and that she was failing to thrive.    Acute on chronic systolic CHF She was resumed on her home Lasix, BB, hydralazine  Atrial fibrillation She was continued on her previous amiodarone, BB, and Eliquis  Diabetes She had hypoglycemia recurrently in the hospital, and her Lantus was held, but mealtime insulin continued.          Discharge  Diagnoses:  Principal Problem:   Acute on chronic systolic heart failure (HCC) Active Problems:   Cardiac pacemaker in situ   CKD (chronic kidney disease)   Type 2 diabetes mellitus without complication, with long-term current use of insulin (HCC)   Paroxysmal atrial fibrillation (HCC)   Dementia without behavioral disturbance   Lung mass   Edema of both upper extremities    Discharge Instructions  Discharge Instructions    Diet general   Complete by:  As directed    Discharge instructions   Complete by:  As directed    From Dr. Loleta Books: Mrs. Spargo was admitted for swelling that appeared to be from a heart failure episode.  However, after treatment for heart failure, she actually got worse over the few days in the hospital.  Further investigation showed that her cancer has grown, in fact quite large, now almost 9 cm, the size of a baseball and filling most of the right upper part of her lung.  Please discuss with Dr. Sheppard Coil at [facility], and please assist Mrs. Wind with a referral to Hospice.   Increase activity slowly   Complete by:  As directed      Allergies as of 05-12-17   No Known Allergies     Medication List    STOP taking these medications   TOUJEO SOLOSTAR 300 UNIT/ML Sopn Generic drug:  Insulin Glargine     TAKE these medications   acetaminophen 325 MG tablet Commonly known as:  TYLENOL Take 650 mg by mouth every 6 (six) hours as needed.   allopurinol 300 MG tablet Commonly known as:  ZYLOPRIM TAKE ONE TABLET BY MOUTH ONCE DAILY What changed:    how much to take  how to take this  when to take this   amiodarone 200 MG tablet Commonly known as:  PACERONE Take 1 tablet (200 mg total) by mouth daily.   apixaban 2.5 MG Tabs tablet Commonly known as:  ELIQUIS Take 2.5 mg by mouth 2 (two) times daily.   atorvastatin 80 MG tablet Commonly known as:  LIPITOR Take 1 tablet (80 mg total) by mouth daily with supper.   carvedilol 3.125 MG  tablet Commonly known as:  COREG Take 1 tablet (3.125 mg total) by mouth 2 (two) times daily with a meal.   donepezil 10 MG tablet Commonly known as:  ARICEPT Take 1 tablet (10 mg total) by mouth at bedtime.   furosemide 80 MG tablet Commonly known as:  LASIX Take 80 mg by mouth daily.   HUMALOG KWIKPEN 100 UNIT/ML KiwkPen Generic drug:  insulin lispro Inject 5 Units into the skin 3 (three) times daily before meals. When CBG is greater than 200; inject 8 units with a CBG greater than 300   hydrALAZINE 100 MG tablet Commonly known as:  APRESOLINE Take 100 mg by mouth 3 (three) times daily.   meclizine 25 MG tablet Commonly known as:  ANTIVERT Take 25 mg by mouth 3 (three) times daily as needed for dizziness.   WOMENS MULTI VITAMIN & MINERAL PO Take 1 tablet by mouth daily with lunch.       Contact information for follow-up providers    Evans Lance, MD Follow up on 05/18/2017.   Specialty:  Cardiology Why:  at [place] information: [place] [address] [phone]            Contact information for after-discharge care    Destination    [facility] Follow up.   Service:  Skilled Nursing Contact information: [address] [place] [phone]                 No Known Allergies  Consultations:  Cardiology   Procedures/Studies: Ct Head Wo Contrast  Result Date: 04/19/2017 CLINICAL DATA:  Focal neuro deficit not specified. Stroke suspected. EXAM: CT HEAD WITHOUT CONTRAST TECHNIQUE: Contiguous axial images were obtained from the base of the skull through the vertex without intravenous contrast. COMPARISON:  Brain MRI 02/21/2016 FINDINGS: Brain: No evidence of acute infarction, hemorrhage, hydrocephalus, extra-axial collection or mass lesion/mass effect. Moderate remote left occipital infarct. Remote anterior left thalamic infarct. Generalized volume loss that is  mild for age. Vascular: Atherosclerotic calcification. Skull: Midline frontal bone sclerosis is stable from 2017 and considered benign. Sinuses/Orbits: Bilateral cataract resection.  No acute finding. IMPRESSION: 1. No acute finding or change from prior. 2. Remote left occipital and thalamic infarcts. Electronically Signed   By: Monte Fantasia M.D.   On: 04/19/2017 12:24   Ct Chest Wo Contrast  Result Date: 04/19/2017 CLINICAL DATA:  84 year old female with history of breast and colon cancer. Evaluate pulmonary nodule. EXAM: CT CHEST WITHOUT CONTRAST TECHNIQUE: Multidetector CT imaging of the chest was performed following the standard protocol without IV contrast. COMPARISON:  Chest CT 04/04/2016. FINDINGS: Cardiovascular: Heart size is mildly enlarged. There is no significant pericardial fluid, thickening or pericardial calcification. There is aortic atherosclerosis, as well as atherosclerosis of the great vessels of the mediastinum and the coronary arteries, including calcified atherosclerotic plaque in the left main, left  anterior descending, left circumflex and right coronary arteries. Calcifications of the aortic valve. Right-sided pacemaker with lead tips terminating in the right atrium and near the right ventricular apex. Mediastinum/Nodes: Fullness in the right hilar region likely related to underlying lymphadenopathy, but incompletely evaluated on today's noncontrast CT examination. No other mediastinal or left hilar lymphadenopathy. Esophagus is patulous and fluid-filled. No axillary lymphadenopathy. Lungs/Pleura: Significant increase in size of right upper lobe lesion which is currently a 8.6 x 5.7 cm mass (axial image 57 of series 4). Moderate to large right and moderate left pleural effusions lying dependently. Scattered areas of subsegmental atelectasis throughout the mid to lower lungs bilaterally. Mild septal thickening noted throughout the mid to upper lungs bilaterally. Upper Abdomen: Several  calcified granulomas in the liver. 1.9 cm high attenuation lesion in the medial aspect of the interpolar region of the right kidney, incompletely characterized on today's noncontrast CT examination, but likely to represent a proteinaceous/hemorrhagic cysts. Aortic atherosclerosis. Musculoskeletal: There are no aggressive appearing lytic or blastic lesions noted in the visualized portions of the skeleton. IMPRESSION: 1. Interval enlargement of right upper lobe mass which currently measures 8.6 x 5.7 cm, with prominent soft tissue in the right hilar region which likely reflects underlying right hilar lymphadenopathy. 2. Moderate to large right and moderate left pleural effusion. 3. Aortic atherosclerosis, in addition to left main and 3 vessel coronary artery disease. 4. There are calcifications of the aortic valve. Echocardiographic correlation for evaluation of potential valvular dysfunction may be warranted if clinically indicated. 5. Additional incidental findings, as above. Aortic Atherosclerosis (ICD10-I70.0). Electronically Signed   By: Vinnie Langton M.D.   On: 04/19/2017 15:18   Dg Chest Port 1 View  Result Date: 04/12/2017 CLINICAL DATA:  Shortness of breath. EXAM: PORTABLE CHEST 1 VIEW COMPARISON:  Radiographs of July 26, 2016. FINDINGS: Stable cardiomediastinal silhouette. Atherosclerosis of thoracic aorta is noted. No pneumothorax is noted. Right-sided pacemaker is unchanged in position. No significant pleural effusion is noted. Probable scarring or atelectasis is noted in the lingular segment of left upper lobe. Enlarged right perihilar density is noted most consistent with neoplasm or malignancy and probable postobstructive atelectasis or infiltrate. Bony thorax is unremarkable. IMPRESSION: Enlarged right perihilar density is noted consistent with neoplasm or malignancy and associated postobstructive atelectasis or infiltrate. Aortic Atherosclerosis (ICD10-I70.0). Electronically Signed   By: Marijo Conception, M.D.   On: 04/12/2017 08:00   Bilateral UE Doppler Indications: Swelling Examination Guidelines: A complete evaluation includes B-mode imaging, spectral doppler, color doppler, and power doppler as needed of all accessible portions of each vessel. Bilateral testing is considered an integral part of a complete examination. Limited examinations for reoccurring indications may be performed as noted.  Limitations: Poor ultrasound/tissue interface. Findings: +----------+-------+--------+------------+------------+-------+---------+ RIGHT   PatencyThrombusCompressibleSpontaneous Phasic Augmented +----------+-------+--------+------------+------------+-------+---------+ IJV    patent       yes   Spontaneous.Phasic.      +----------+-------+--------+------------+------------+-------+---------+ Subclavianpatent           Spontaneous.Phasic.      +----------+-------+--------+------------+------------+-------+---------+ Axillary patent       yes   Spontaneous.Phasic.      +----------+-------+--------+------------+------------+-------+---------+ Brachial patent       yes   Spontaneous.Phasic.      +----------+-------+--------+------------+------------+-------+---------+ Radial  patent       yes                   +----------+-------+--------+------------+------------+-------+---------+ Ulnar   patent       yes                   +----------+-------+--------+------------+------------+-------+---------+  Cephalic patent       yes                   +----------+-------+--------+------------+------------+-------+---------+ Basilic  patent       yes                    +----------+-------+--------+------------+------------+-------+---------+  +----------+-------+--------+------------+------------+-------+---------+ LEFT   PatencyThrombusCompressibleSpontaneous Phasic Augmented +----------+-------+--------+------------+------------+-------+---------+ IJV    patent       yes   Spontaneous.Phasic.      +----------+-------+--------+------------+------------+-------+---------+ Subclavianpatent           Spontaneous.Phasic.      +----------+-------+--------+------------+------------+-------+---------+ Axillary patent       yes   Spontaneous.Phasic.      +----------+-------+--------+------------+------------+-------+---------+ Brachial patent       yes   Spontaneous.Phasic.      +----------+-------+--------+------------+------------+-------+---------+ Radial  patent       yes                   +----------+-------+--------+------------+------------+-------+---------+ Ulnar   patent       yes                   +----------+-------+--------+------------+------------+-------+---------+ Cephalic patent       yes                   +----------+-------+--------+------------+------------+-------+---------+ Basilic  patent       yes                   +----------+-------+--------+------------+------------+-------+---------+   Final Interpretation: No evidence of deep vein or superficial vein thrombosis involving the right and left upper extremities, subclavian veins and internal jugular veins.   *See table(s) above for measurements and observations.  Adele Barthel Electronically signed by Adele Barthel on 04/20/2017 at 2:19:18 PM.    Subjective: Confused.  Very weak, requiring assistance for transfers or repositioning.  Able to feed self.  No chest  pain.  Somewhat dyspneic.     Discharge Exam: Vitals:   2017/05/14 0541 05-14-2017 1202  BP: 98/65 (!) 121/51  Pulse: 61 60  Resp: 18 20  Temp: (!) 97.5 F (36.4 C) (!) 97.3 F (36.3 C)  SpO2: 94% 99%   Vitals:   04/20/17 1632 04/20/17 2014 14-May-2017 0541 05-14-2017 1202  BP: (!) 130/55 103/66 98/65 (!) 121/51  Pulse: 60 60 61 60  Resp:  18 18 20   Temp:  (!) 97.4 F (36.3 C) (!) 97.5 F (36.4 C) (!) 97.3 F (36.3 C)  TempSrc:  Oral Oral Oral  SpO2: 98% 93% 94% 99%  Weight:   74.5 kg (164 lb 3.9 oz)   Height:        General: Pt is awake, interactive, but confused, thinks she is in [place] Cardiovascular: RRR, S1/S2 +, no rubs, no gallops Respiratory: Diminished bilaterally, worse on right.  No rales, wheezes. Abdominal: Soft, NT, ND, bowel sounds + Extremities: the arms bilaterally and one leg is swollen, pitting.   Neuro: Confused, barely able to lift arms    The results of significant diagnostics from this hospitalization (including imaging, microbiology, ancillary and laboratory) are listed below for reference.     Microbiology: Recent Results (from the past 240 hour(s))  MRSA PCR Screening     Status: None   Collection Time: 04/11/17  8:15 PM  Result Value Ref Range Status   MRSA by PCR NEGATIVE NEGATIVE Final    Comment:        The GeneXpert MRSA Assay (FDA approved for NASAL specimens  only), is one component of a comprehensive MRSA colonization surveillance program. It is not intended to diagnose MRSA infection nor to guide or monitor treatment for MRSA infections. Performed at [facility] [place], [place] [address] [place], [place] [address]      Labs: BNP (last 3 results) Recent Labs    04/11/17 1853  BNP 417.4*   Basic Metabolic Panel: Recent Labs  Lab 04/16/17 0558 04/17/17 0904 04/19/17 0854 04/20/17 0445 05/18/2017 0310  NA 134* 135 134* 135 133*  K 4.0 3.9 4.0 4.6 5.0  CL 97* 99* 98* 98* 98*  CO2 25 25 27 26 24   GLUCOSE 102* 121* 72 122*  106*  BUN 68* 70* 69* 71* 77*  CREATININE 2.30* 2.17* 2.02* 2.10* 2.18*  CALCIUM 9.0 8.7* 8.8* 8.9 8.9   Liver Function Tests: No results for input(s): AST, ALT, ALKPHOS, BILITOT, PROT, ALBUMIN in the last 168 hours. No results for input(s): LIPASE, AMYLASE in the last 168 hours. No results for input(s): AMMONIA in the last 168 hours. CBC: Recent Labs  Lab 04/18/17 1048  WBC 6.9  NEUTROABS 5.3  HGB 9.7*  HCT 29.7*  MCV 86.8  PLT 189   Cardiac Enzymes: No results for input(s): CKTOTAL, CKMB, CKMBINDEX, TROPONINI in the last 168 hours. BNP: Invalid input(s): POCBNP CBG: Recent Labs  Lab 04/20/17 0737 04/20/17 1119 04/20/17 1613 2017/05/18 0747 05/18/2017 1131  GLUCAP 117* 113* 95 127* 137*   D-Dimer No results for input(s): DDIMER in the last 72 hours. Hgb A1c No results for input(s): HGBA1C in the last 72 hours. Lipid Profile No results for input(s): CHOL, HDL, LDLCALC, TRIG, CHOLHDL, LDLDIRECT in the last 72 hours. Thyroid function studies No results for input(s): TSH, T4TOTAL, T3FREE, THYROIDAB in the last 72 hours.  Invalid input(s): FREET3 Anemia work up No results for input(s): VITAMINB12, FOLATE, FERRITIN, TIBC, IRON, RETICCTPCT in the last 72 hours. Urinalysis    Component Value Date/Time   COLORURINE YELLOW 02/19/2016 1330   APPEARANCEUR CLEAR 02/19/2016 1330   LABSPEC 1.009 02/19/2016 1330   PHURINE 7.0 02/19/2016 1330   GLUCOSEU NEGATIVE 02/19/2016 1330   HGBUR SMALL (A) 02/19/2016 1330   BILIRUBINUR NEGATIVE 02/19/2016 1330   KETONESUR NEGATIVE 02/19/2016 1330   PROTEINUR NEGATIVE 02/19/2016 1330   NITRITE NEGATIVE 02/19/2016 1330   LEUKOCYTESUR SMALL (A) 02/19/2016 1330   Sepsis Labs Invalid input(s): PROCALCITONIN,  WBC,  LACTICIDVEN Microbiology Recent Results (from the past 240 hour(s))  MRSA PCR Screening     Status: None   Collection Time: 04/11/17  8:15 PM  Result Value Ref Range Status   MRSA by PCR NEGATIVE NEGATIVE Final    Comment:         The GeneXpert MRSA Assay (FDA approved for NASAL specimens only), is one component of a comprehensive MRSA colonization surveillance program. It is not intended to diagnose MRSA infection nor to guide or monitor treatment for MRSA infections. Performed at [facility], [place] [address] [place],  [address]      Time coordinating discharge: Over 30 minutes  SIGNED:   Edwin Dada, MD  [facility] 05-18-17, 3:15 PM

## 2017-05-21 NOTE — Progress Notes (Signed)
Pt to be transferred back to [facility] today where she resides.  Report called to facility.  SW has arranged transport pick-up to be within the hour. Pt demonstrates no distress and is informed of transfer back to facility. SW also notified sister of patient. All belongings with patient.

## 2017-05-21 NOTE — Code Documentation (Signed)
NO pulses present.

## 2017-05-21 NOTE — ED Notes (Signed)
Pt has been ruled out for donation per [place] [facility], reference number [id number], [place] [facility]et. Pt can be released to nursing home.

## 2017-05-21 DEATH — deceased

## 2018-10-09 IMAGING — CT CT CHEST W/O CM
2 of 3 series · 15 of 36 positions shown, 18 images · non-contrast
Comparison: Chest CT 04/04/2016.

CLINICAL DATA: 85-year-old female with history of breast and colon
cancer. Evaluate pulmonary nodule.

EXAM:
CT CHEST WITHOUT CONTRAST
TECHNIQUE: Multidetector CT imaging of the chest was performed following the
standard protocol without IV contrast.

[Series 4: thorax 2.0 · axial · 0.63mm/px · z∈[+1116,+1346]mm · 12 of 135 slices shown, 15 images]
[im 10/135  mediastinal]
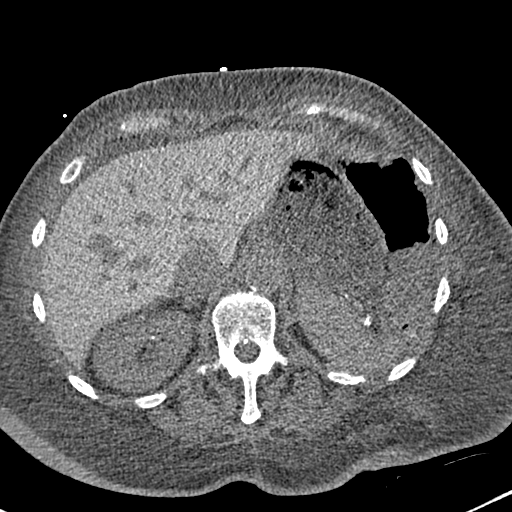
[im 10/135  lung]
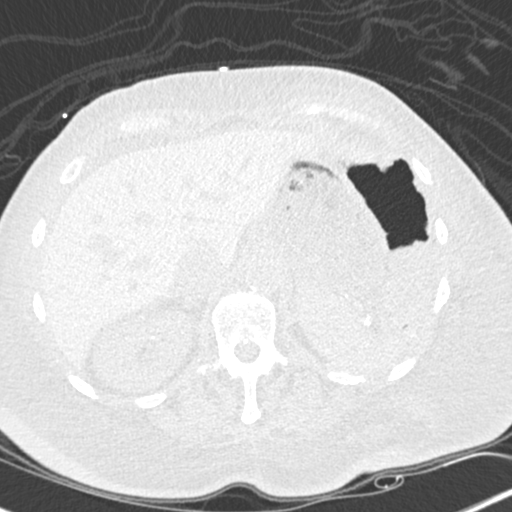
[im 20/135  lung]
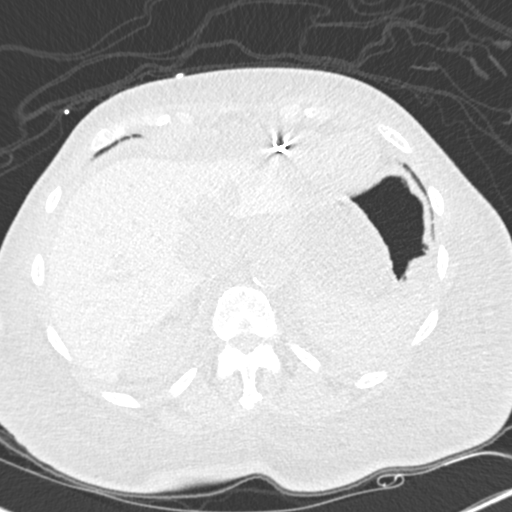
[im 30/135  lung]
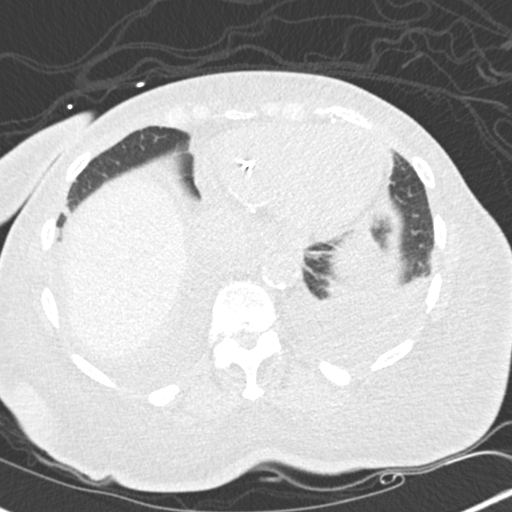
[im 40/135  lung]
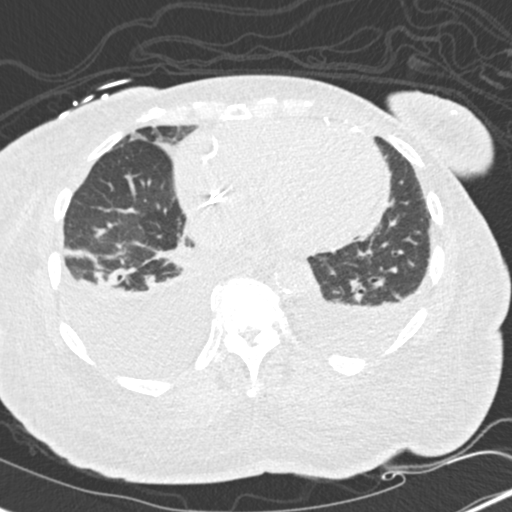
[im 50/135  mediastinal]
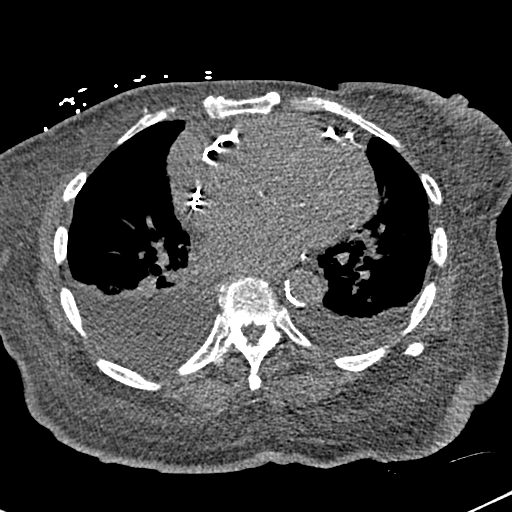
[im 50/135  lung]
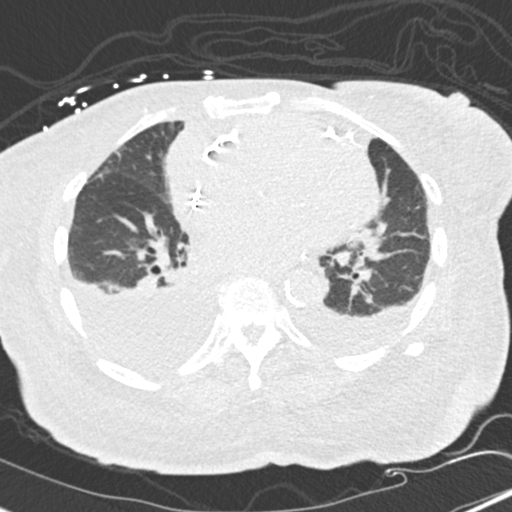
[im 60/135  lung]
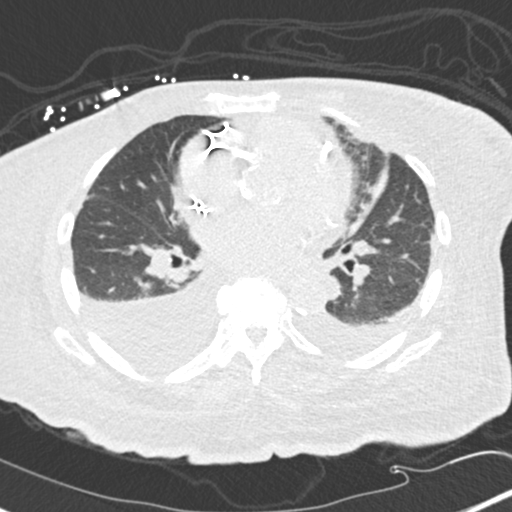
[im 75/135  lung]
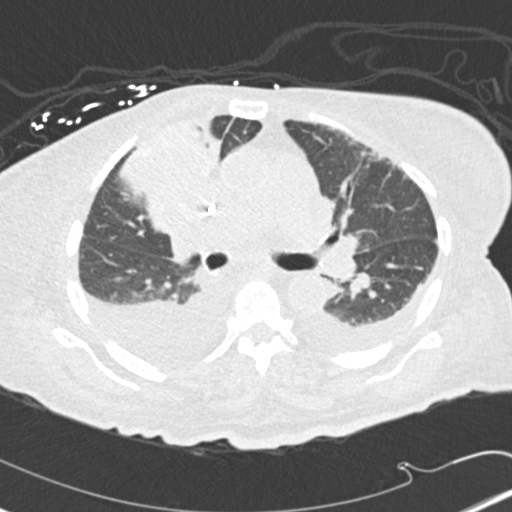
[im 85/135  lung]
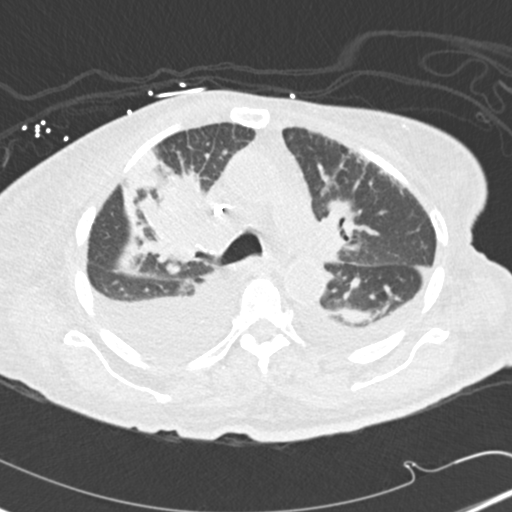
[im 95/135  mediastinal]
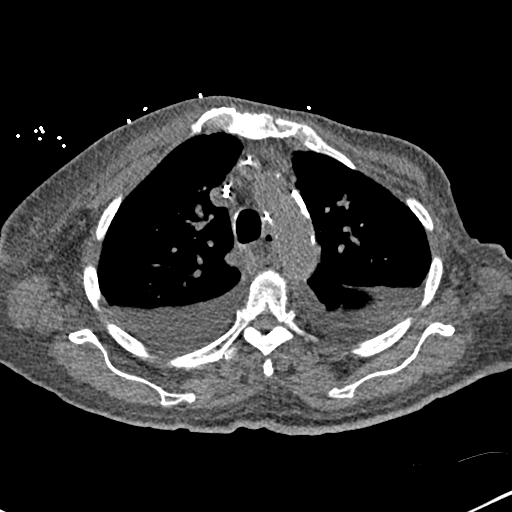
[im 95/135  lung]
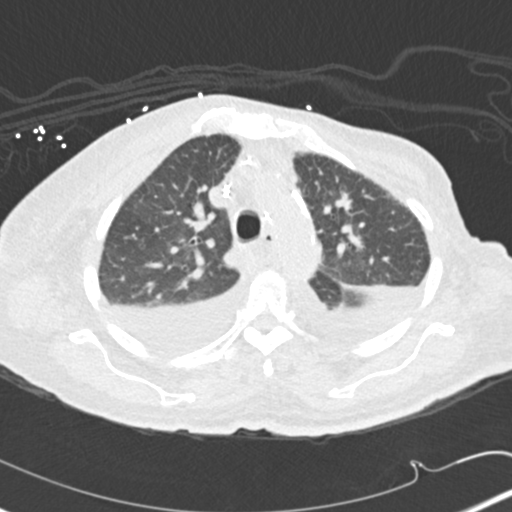
[im 105/135  lung]
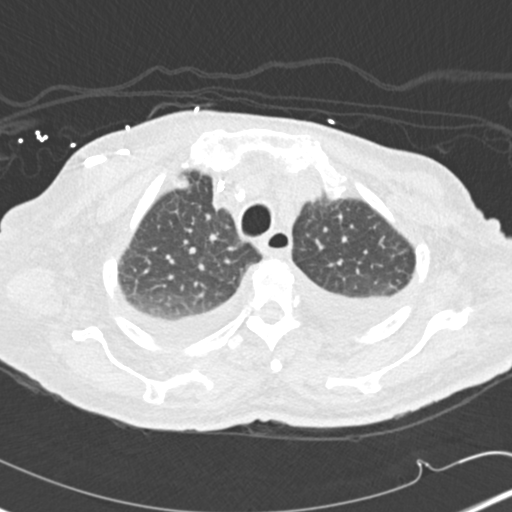
[im 115/135  lung]
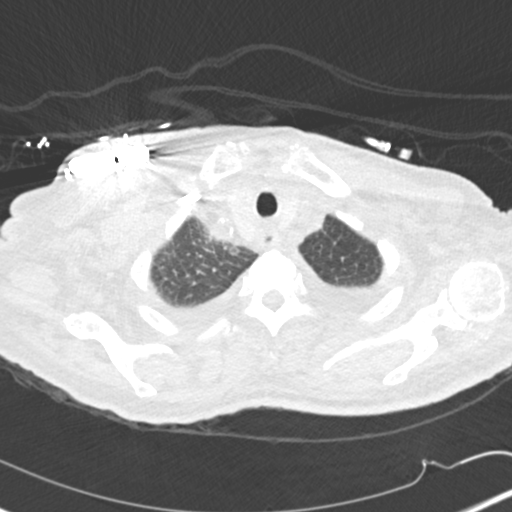
[im 125/135  lung]
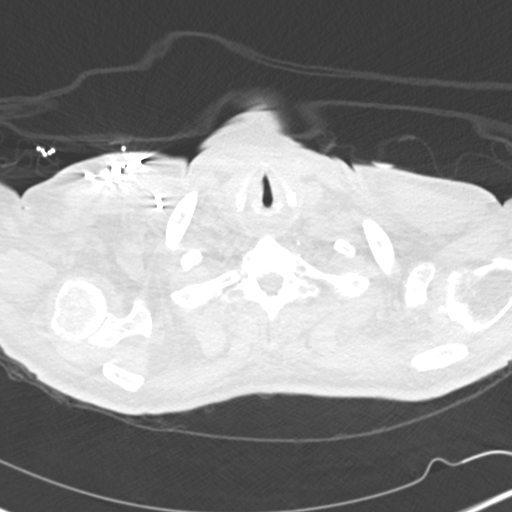

[Series 6: coronal · coronal · 0.59mm/px · 3 of 79 slices shown]
[im 16/79  lung]
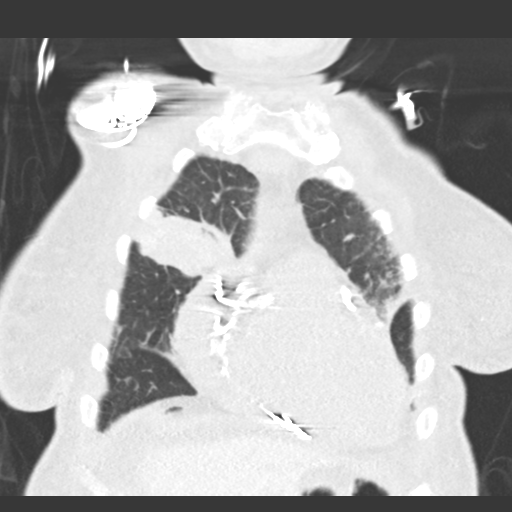
[im 32/79  lung]
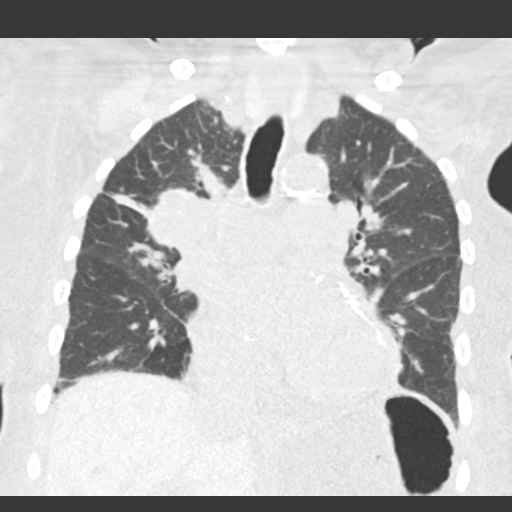
[im 47/79  lung]
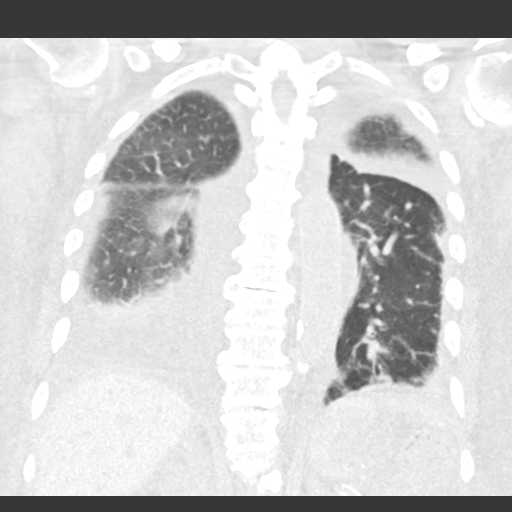

[15 of 36 positions shown; findings below may reference images not displayed]

FINDINGS: Cardiovascular: Heart size is mildly enlarged. There is no
significant pericardial fluid, thickening or pericardial
calcification. There is aortic atherosclerosis, as well as
atherosclerosis of the great vessels of the mediastinum and the
coronary arteries, including calcified atherosclerotic plaque in the
left main, left anterior descending, left circumflex and right
coronary arteries. Calcifications of the aortic valve. Right-sided
pacemaker with lead tips terminating in the right atrium and near
the right ventricular apex.

Mediastinum/Nodes: Fullness in the right hilar region likely related
to underlying lymphadenopathy, but incompletely evaluated on today's
noncontrast CT examination. No other mediastinal or left hilar
lymphadenopathy. Esophagus is patulous and fluid-filled. No axillary
lymphadenopathy.

Lungs/Pleura: Significant increase in size of right upper lobe
lesion which is currently a 8.6 x 5.7 cm mass (axial image 57 of
series 4). Moderate to large right and moderate left pleural
effusions lying dependently. Scattered areas of subsegmental
atelectasis throughout the mid to lower lungs bilaterally. Mild
septal thickening noted throughout the mid to upper lungs
bilaterally.

Upper Abdomen: Several calcified granulomas in the liver. 1.9 cm
high attenuation lesion in the medial aspect of the interpolar
region of the right kidney, incompletely characterized on today's
noncontrast CT examination, but likely to represent a
proteinaceous/hemorrhagic cysts. Aortic atherosclerosis.

Musculoskeletal: There are no aggressive appearing lytic or blastic
lesions noted in the visualized portions of the skeleton.
IMPRESSION: 1. Interval enlargement of right upper lobe mass which currently
measures 8.6 x 5.7 cm, with prominent soft tissue in the right hilar
region which likely reflects underlying right hilar lymphadenopathy.
2. Moderate to large right and moderate left pleural effusion.
3. Aortic atherosclerosis, in addition to left main and 3 vessel
coronary artery disease.
4. There are calcifications of the aortic valve. Echocardiographic
correlation for evaluation of potential valvular dysfunction may be
warranted if clinically indicated.
5. Additional incidental findings, as above.

Aortic Atherosclerosis ([id number]).

## 2018-10-09 IMAGING — CT CT HEAD W/O CM
3 of 4 series · 15 of 47 positions shown, 18 images · non-contrast
Comparison: Brain MRI 02/21/2016

CLINICAL DATA: Focal neuro deficit not specified. Stroke suspected.

EXAM:
CT HEAD WITHOUT CONTRAST
TECHNIQUE: Contiguous axial images were obtained from the base of the skull
through the vertex without intravenous contrast.

[Series 4: head 2.0 h70h · axial · 0.40mm/px · z∈[-104,+20]mm · 9 of 78 slices shown, 12 images]
[im 8/78  brain]
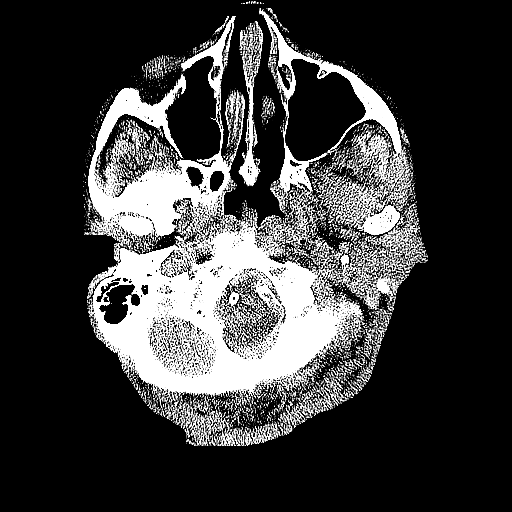
[im 8/78  bone]
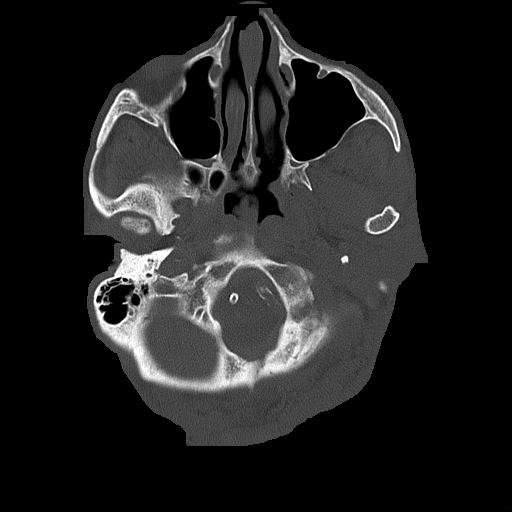
[im 16/78  brain]
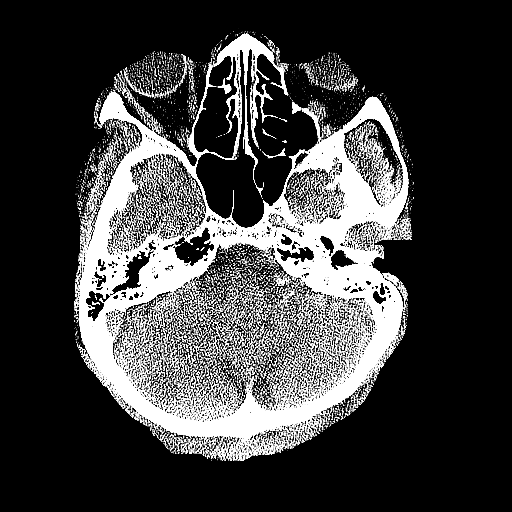
[im 24/78  brain]
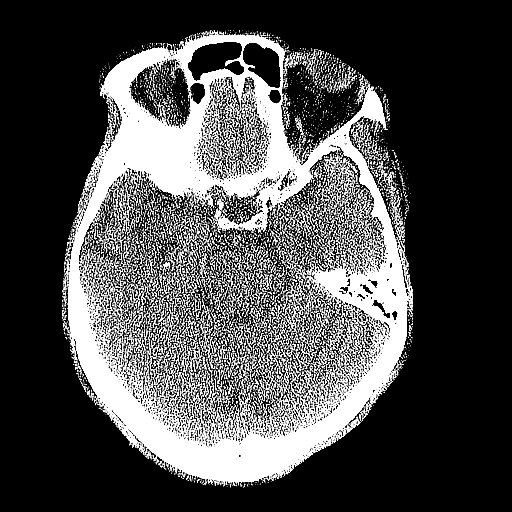
[im 31/78  brain]
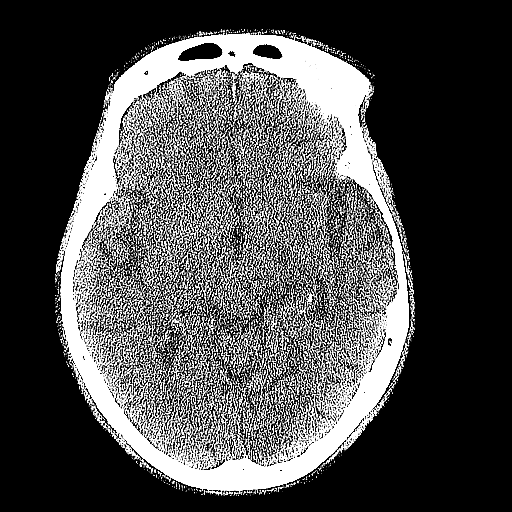
[im 39/78  brain]
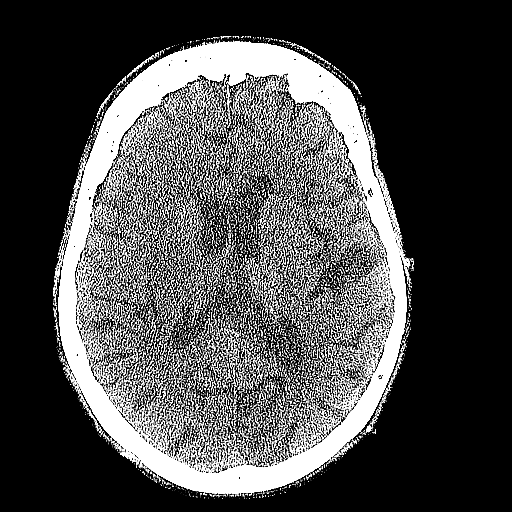
[im 39/78  bone]
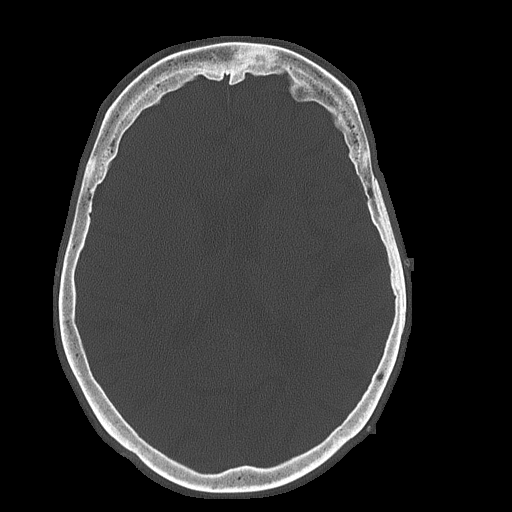
[im 47/78  brain]
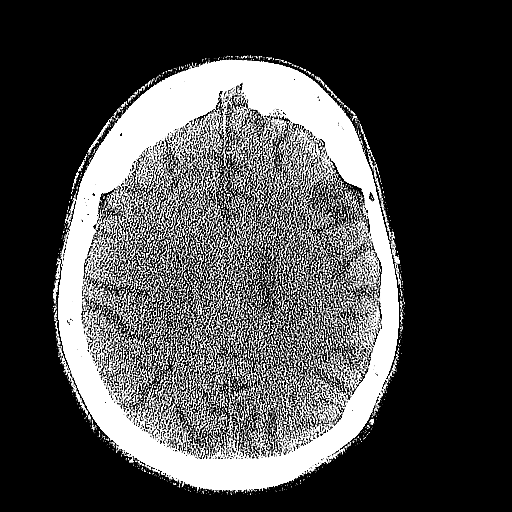
[im 54/78  brain]
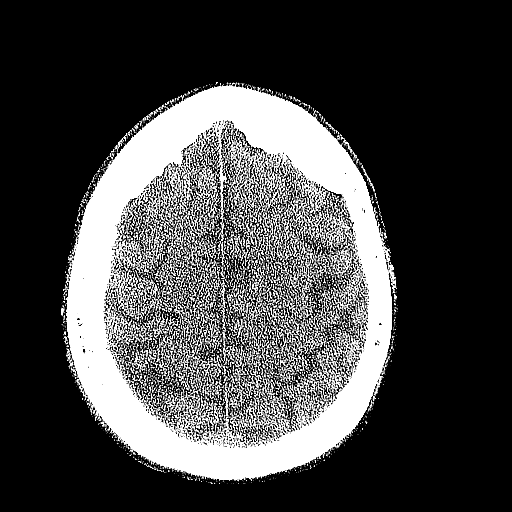
[im 62/78  brain]
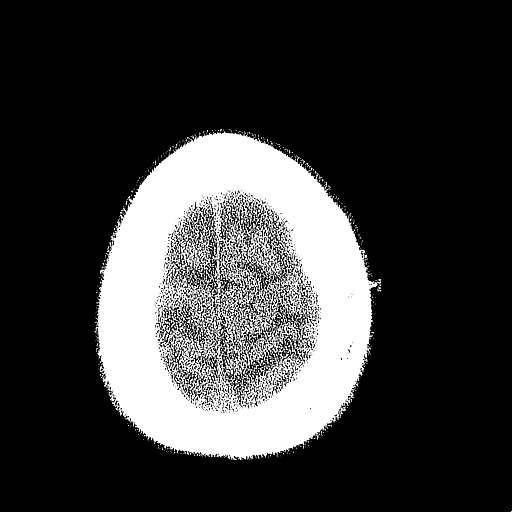
[im 70/78  brain]
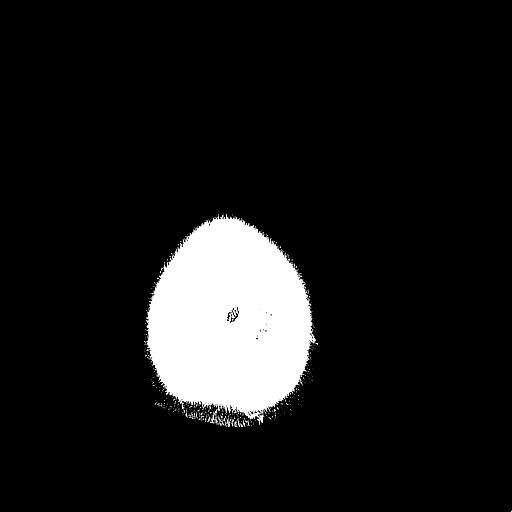
[im 70/78  bone]
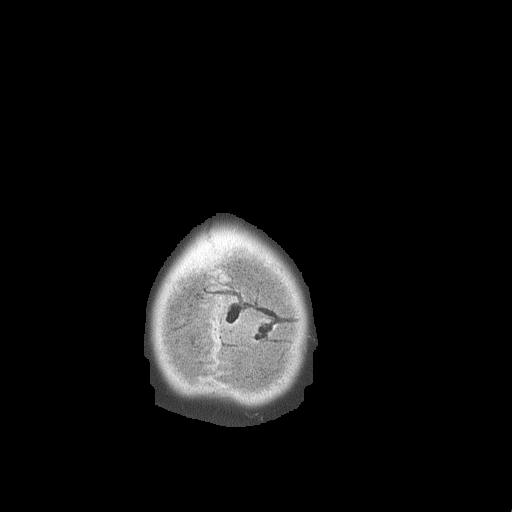

[Series 5: head 3.0 mpr cor · coronal · 0.30mm/px · 3 of 70 slices shown]
[im 24/70  brain]
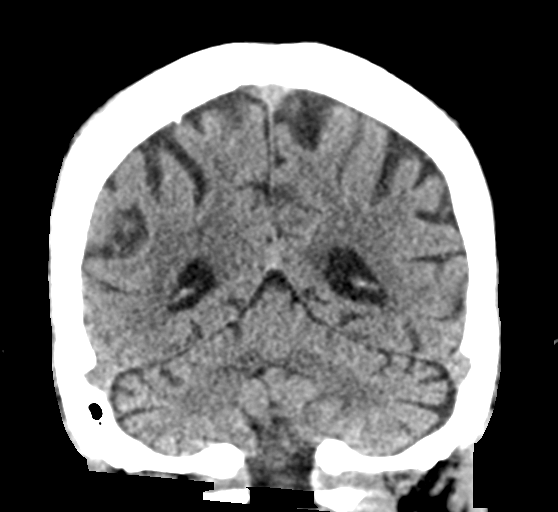
[im 31/70  brain]
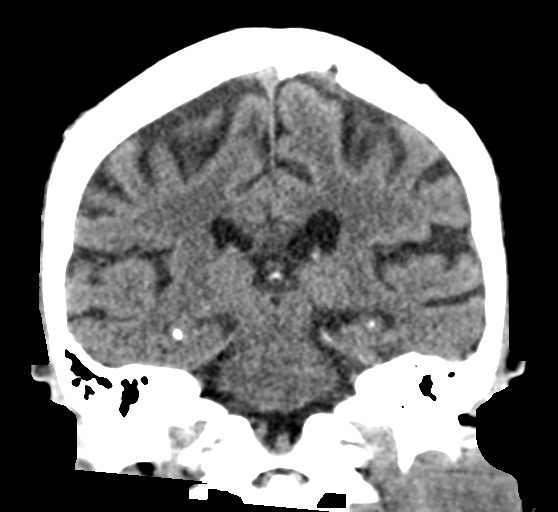
[im 39/70  brain]
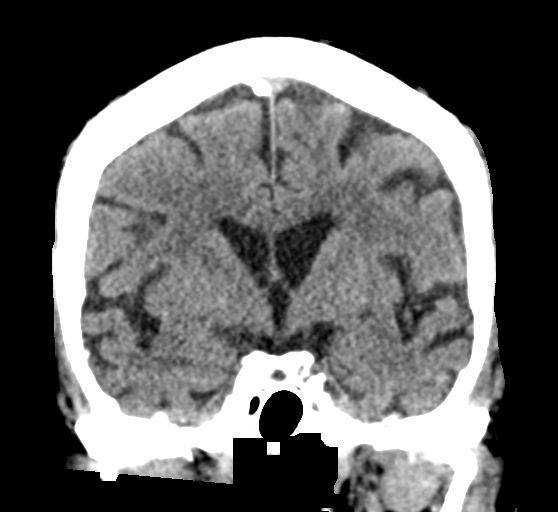

[Series 6: head 3.0 mpr sag · sagittal · 0.30mm/px · 3 of 58 slices shown]
[im 20/58  brain]
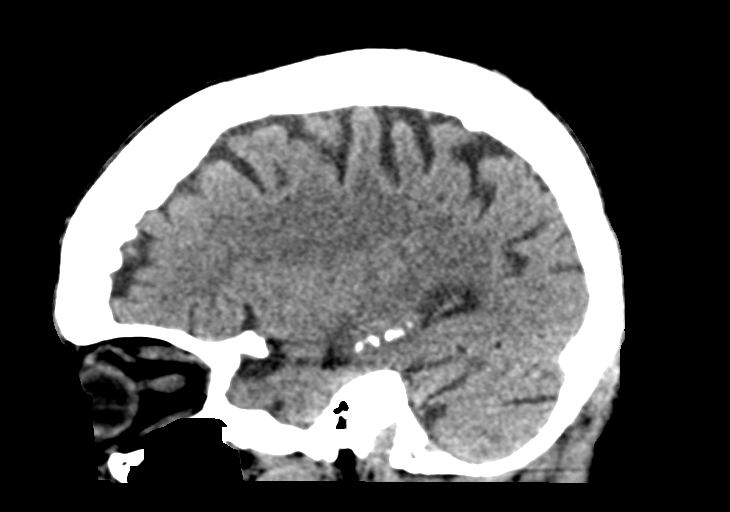
[im 29/58  brain]
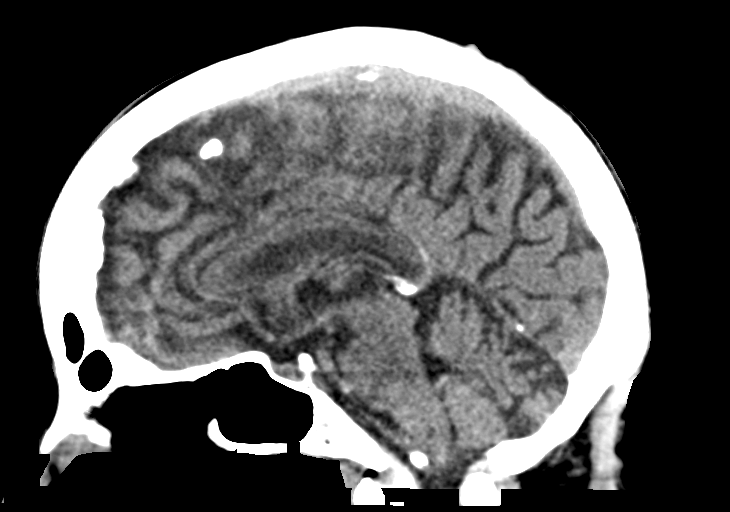
[im 39/58  brain]
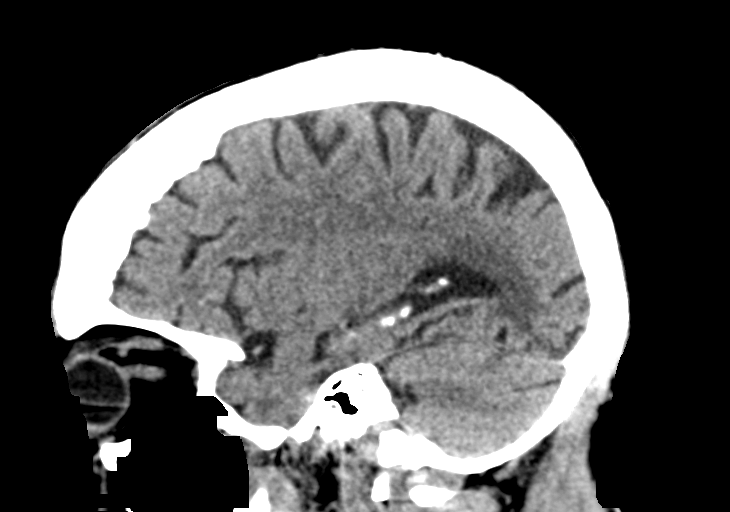

[15 of 47 positions shown; findings below may reference images not displayed]

FINDINGS: Brain: No evidence of acute infarction, hemorrhage, hydrocephalus,
extra-axial collection or mass lesion/mass effect. Moderate remote
left occipital infarct. Remote anterior left thalamic infarct.
Generalized volume loss that is mild for age.

Vascular: Atherosclerotic calcification.

Skull: Midline frontal bone sclerosis is stable from 2174 and
considered benign.

Sinuses/Orbits: Bilateral cataract resection.  No acute finding.
IMPRESSION: 1. No acute finding or change from prior.
2. Remote left occipital and thalamic infarcts.
